# Patient Record
Sex: Female | Born: 1950
Health system: Southern US, Community
[De-identification: ages and names within clinical notes are randomized; demographics above are authoritative.]

## PROBLEM LIST (undated history)

## (undated) DIAGNOSIS — C801 Malignant (primary) neoplasm, unspecified: Secondary | ICD-10-CM

## (undated) DIAGNOSIS — F0390 Unspecified dementia without behavioral disturbance: Secondary | ICD-10-CM

## (undated) DIAGNOSIS — J45909 Unspecified asthma, uncomplicated: Secondary | ICD-10-CM

## (undated) DIAGNOSIS — I10 Essential (primary) hypertension: Secondary | ICD-10-CM

## (undated) HISTORY — DX: Essential (primary) hypertension: I10

## (undated) HISTORY — DX: Unspecified asthma, uncomplicated: J45.909

## (undated) HISTORY — PX: ABDOMINAL HYSTERECTOMY: SHX81

---

## 2004-10-12 ENCOUNTER — Ambulatory Visit (HOSPITAL_COMMUNITY): Admission: RE | Admit: 2004-10-12 | Discharge: 2004-10-12 | Payer: Self-pay | Admitting: Family Medicine

## 2007-11-03 ENCOUNTER — Inpatient Hospital Stay (HOSPITAL_COMMUNITY): Admission: EM | Admit: 2007-11-03 | Discharge: 2007-11-05 | Payer: Self-pay | Admitting: Emergency Medicine

## 2011-05-15 NOTE — H&P (Signed)
NAME:  Whitney Velez, Whitney Velez               ACCOUNT NO.:  [id number]   MEDICAL RECORD NO.:  [id number]          PATIENT TYPE:  OBV   LOCATION:  A226                          FACILITY:  [facility]   PHYSICIAN:  Osvaldo Shipper, MD     DATE OF BIRTH:  1950/12/30   DATE OF ADMISSION:  11/03/2007  DATE OF DISCHARGE:  LH                              HISTORY & PHYSICAL   PRIMARY CARE PHYSICIAN:  Donna Bernard, M.D.   ADMISSION DIAGNOSES:  1. Angioedema, likely secondary to ace inhibitor.  2. History of diabetes.  3. History of hypertension.  4. History of asthma.   CHIEF COMPLAINT:  Swollen tongue.   HISTORY OF PRESENT ILLNESS:  The patient is a 61 year old African  American female who has a past medical history of diabetes,  hypertension, asthma, who was in her usual state of health about four  weeks ago when she had some tongue swelling which resolved on its own,  and about two weeks ago, she again developed tongue swelling after she  had some barbecue sauce which also resolved on its own, and then last  night, she again had a barbecue chicken, and the tongue swelling which  got progressively worse causing her difficulty swallowing, and she  presented to the ED subsequently.  She denies any shortness of breath.  Denies any pain.  Denies any trauma to the tongue.  Denies any seizure  activity.  She says she has been on the Enalapril for about 7-8 months  now.  Prior to four weeks, she has never had these kind of complaints  before.   MEDICATIONS AT HOME:  1. Metformin 500 mg b.i.d.  2. Aspirin 81 mg once daily.  3. Albuterol inhaler as needed.  4. Enalapril 10 mg once daily.  5. Tussionex over-the-counter as needed.  6. HCTZ 25 mg once daily.   ALLERGIES:  PENICILLIN AND NOW POTENTIALLY ACE INHIBITORS.   PAST MEDICAL HISTORY:  Positive for asthma, diabetes and hypertension.   PAST SURGICAL HISTORY:  Hysterectomy.   SOCIAL HISTORY:  Lives in [place], works as a Lawyer.  No alcohol  use,  illicit drug use or smoking.   FAMILY HISTORY:  Positive for diabetes, hypertension.   REVIEW OF SYSTEMS:  GENERAL:  Positive for weakness.  HEENT:  As in HPI.  CARDIOVASCULAR:  Unremarkable.  RESPIRATORY:  Unremarkable.  GI:  Unremarkable.  GU:  Unremarkable.   MUSCULOSKELETAL:  Unremarkable.  ENDOCRINE:  Unremarkable.  NEUROLOGICAL:  Unremarkable.  PSYCHIATRIC:  Unremarkable.  DERMATOLOGICAL:  Unremarkable.   PHYSICAL EXAMINATION:  VITAL SIGNS:  Temperature 97.7, blood pressure  150/94 rising to 183/82, heart rate 115, respiratory rate 18, pulse  oximetry 96% on room air.  GENERAL:  This is an obese African American female in no distress.  HEENT:  Head and eyes are all within normal limits.  She has a swollen  tongue that is clearly obvious.  She has some edema in her neck as well.  No thyromegaly is appreciated.  LUNGS:  Clear to auscultation.  No wheezing is appreciated.  CARDIOVASCULAR:  S1, S2 is slightly tachycardic, irregular,  no murmurs  appreciated.  ABDOMEN:  Soft, nontender, nondistended.  Bowel sounds are present.  No  mass or organomegaly is appreciated.  LOWER EXTREMITIES:  Without edema.  Peripheral pulses are palpable.   LABORATORY DATA:  No labs have been obtained.  No imaging studies have  been done.   ASSESSMENT:  This is a 61 year old African American female who has been  confirmed, as discussed earlier, presents with tongue swelling.  She has  evidence for angioedema.  She is on an ACE inhibitor.  She has been on  it for about 7-8 months now, and still, I think this is the likely  reason for angioedema.   PLAN:  Angioedema.  Will observe the patient in the hospital as she is  at risk for respiratory failure.  She has been given Pepcid and Solu-  Medrol which we will continue.  We will also give her some nebulizer  treatments.  We will check a serum C4 level, check her CBC, C-met and  PT/PTT.  Epinephrine could not be given because of significant   hypertension.   I will hold off on any oral medication for now until her swelling  improves and she is able to take orally comfortably.  In the meantime,  we will check her CBGs q.a.c. and q.h.s. and cover with sliding scale  insulin.   Dr. Gerda Diss will resume care at about 7-8 o'clock this morning.  Until  then, we will provide coverage.      Osvaldo Shipper, MD  Electronically Signed     GK/MEDQ  D:  11/03/2007  T:  11/03/2007  Job:  [id number]   cc:   Donna Bernard, M.D.  Fax: [phone]

## 2011-05-18 NOTE — Discharge Summary (Signed)
NAMECRISTA, NUON               ACCOUNT NO.:  [id number]   MEDICAL RECORD NO.:  [id number]          PATIENT TYPE:  INP   LOCATION:  A226                          FACILITY:  [facility]   PHYSICIAN:  Donna Bernard, M.D.DATE OF BIRTH:  11/04/1950   DATE OF ADMISSION:  11/03/2007  DATE OF DISCHARGE:  11/05/2008LH                               DISCHARGE SUMMARY   FINAL DIAGNOSES:  1. Angioedema.  2. Exacerbation of asthma.  3. Hypertension.  4. Type 2 diabetes.   FINAL DISPOSITION:  Patient discharged to home.   FOLLOW UP:  1. Patient followup in our office in week.  2. Patient to follow up with an asthma and allergy specialists within      a week.   DISCHARGE MEDICATIONS:  1. Prednisone taper as directed.  2. Verapamil 180 mg daily added to chronic medicine.  3. Stop Enalapril.  4. Tussionex 1 teaspoon q.h.s. p.r.n. for cough.   INITIAL HISTORY AND PHYSICAL:  Please see H&P as dictated by Dr.  Rito Ehrlich.   HOSPITAL COURSE:  This patient is a 61 year old black female who has a  history of diabetes, hypertension, and asthma who presented multiple  times to the ER with intermittent swelling of face, tongue, and throat.  On the day of admission, it hit her pretty severely.  She had gone been  on enalapril for the past 8 months.  She never had any bouts like this  prior. The patient came in with air hunger.  She was felt to be short of  breath.  On exam, she had no true stridor.  She had an impressively  swollen tongue.  There was also edema in her neck.  O2 saturations were  good at 96% on room air.  With all her symptomatology, it was felt that  she needed to be in the hospital.  We admitted her.  She was placed on  IV Solu-Medrol.  Her ACE inhibitor was stopped.  We initiated calcium  channel blocker.  Over  the next 48 hours, the patient gradually improved.  She was also given  Xopenex breathing treatments every 6 hours, with this significant  improvement in her  symptomatology.  On the day of discharge, she was  felt to be stable and sent home with the diagnoses and disposition as  noted above.      Donna Bernard, M.D.  Electronically Signed     WSL/MEDQ  D:  12/15/2007  T:  12/15/2007  Job:  [id number]

## 2011-10-09 LAB — DIFFERENTIAL
Basophils Absolute: 0
Basophils Relative: 0
Eosinophils Absolute: 0.5
Eosinophils Relative: 4
Lymphocytes Relative: 12
Lymphs Abs: 1.6
Monocytes Absolute: 0.7
Monocytes Relative: 5
Neutro Abs: 10.9 — ABNORMAL HIGH
Neutrophils Relative %: 80 — ABNORMAL HIGH

## 2011-10-09 LAB — COMPREHENSIVE METABOLIC PANEL
ALT: 10
AST: 14
Albumin: 3.3 — ABNORMAL LOW
Alkaline Phosphatase: 78
BUN: 18
CO2: 31
Calcium: 9.3
Chloride: 103
Creatinine, Ser: 1.05
GFR calc Af Amer: >60
GFR calc non Af Amer: 54 — ABNORMAL LOW
Glucose, Bld: 125 — ABNORMAL HIGH
Potassium: 3.2 — ABNORMAL LOW
Sodium: 140
Total Bilirubin: 0.6
Total Protein: 8.5 — ABNORMAL HIGH

## 2011-10-09 LAB — CBC
HCT: 30.3 — ABNORMAL LOW
Hemoglobin: 10.2 — ABNORMAL LOW
MCHC: 33.5
MCV: 80.2
Platelets: 386
RBC: 3.78 — ABNORMAL LOW
RDW: 15 — ABNORMAL HIGH
WBC: 13.8 — ABNORMAL HIGH

## 2011-10-09 LAB — PROTIME-INR
INR: 1.1
Prothrombin Time: 14.2

## 2011-10-09 LAB — C4 COMPLEMENT: Complement C4, Body Fluid: 22

## 2011-10-09 LAB — C1 ESTERASE INHIBITOR: C1INH SerPl-mCnc: 18 mg/dL (ref 11–26)

## 2011-10-09 LAB — APTT: aPTT: 29

## 2013-02-04 DIAGNOSIS — E785 Hyperlipidemia, unspecified: Secondary | ICD-10-CM | POA: Diagnosis not present

## 2013-02-04 DIAGNOSIS — R5383 Other fatigue: Secondary | ICD-10-CM | POA: Diagnosis not present

## 2013-02-04 DIAGNOSIS — J45909 Unspecified asthma, uncomplicated: Secondary | ICD-10-CM | POA: Diagnosis not present

## 2013-02-04 DIAGNOSIS — Z79899 Other long term (current) drug therapy: Secondary | ICD-10-CM | POA: Diagnosis not present

## 2013-02-04 DIAGNOSIS — I10 Essential (primary) hypertension: Secondary | ICD-10-CM | POA: Diagnosis not present

## 2013-02-04 DIAGNOSIS — R5381 Other malaise: Secondary | ICD-10-CM | POA: Diagnosis not present

## 2013-02-04 DIAGNOSIS — E119 Type 2 diabetes mellitus without complications: Secondary | ICD-10-CM | POA: Diagnosis not present

## 2013-10-26 ENCOUNTER — Other Ambulatory Visit: Payer: Self-pay | Admitting: Family Medicine

## 2013-12-16 ENCOUNTER — Ambulatory Visit (INDEPENDENT_AMBULATORY_CARE_PROVIDER_SITE_OTHER): Payer: Medicare Other | Admitting: Nurse Practitioner

## 2013-12-16 ENCOUNTER — Encounter: Payer: Self-pay | Admitting: Nurse Practitioner

## 2013-12-16 VITALS — BP 142/90 | Ht 60.0 in | Wt 197.0 lb

## 2013-12-16 DIAGNOSIS — E119 Type 2 diabetes mellitus without complications: Secondary | ICD-10-CM | POA: Insufficient documentation

## 2013-12-16 DIAGNOSIS — I10 Essential (primary) hypertension: Secondary | ICD-10-CM | POA: Insufficient documentation

## 2013-12-16 LAB — POCT GLYCOSYLATED HEMOGLOBIN (HGB A1C): Hemoglobin A1C: 6.3

## 2013-12-16 MED ORDER — VERAPAMIL HCL ER 240 MG PO TBCR: EXTENDED_RELEASE_TABLET | ORAL | Status: DC

## 2013-12-16 MED ORDER — HYDROCODONE-ACETAMINOPHEN 5-325 MG PO TABS: 1.0000 | ORAL_TABLET | ORAL | Status: DC | PRN

## 2013-12-16 MED ORDER — HYDROCHLOROTHIAZIDE 25 MG PO TABS: 25.0000 mg | ORAL_TABLET | Freq: Every day | ORAL | Status: DC

## 2013-12-16 MED ORDER — METFORMIN HCL 500 MG PO TABS: 500.0000 mg | ORAL_TABLET | Freq: Two times a day (BID) | ORAL | Status: DC

## 2013-12-19 ENCOUNTER — Encounter: Payer: Self-pay | Admitting: Nurse Practitioner

## 2013-12-19 NOTE — Progress Notes (Signed)
Subjective:  Presents for routine followup. Has an active job. Did not take her BP pills last night, has run out. Compliant up until been. Defers flu and pneumonia vaccines. No chest pain shortness of breath or edema. Doing fairly well with her diet. Takes a rare hydrocodone for orthopedic pain.  Objective:   BP 142/90  Ht 5' (1.524 m)  Wt 197 lb (89.359 kg)  BMI 38.47 kg/m2 NAD. Alert, oriented. Lungs clear. Heart regular rate rhythm. Lower extremities no edema. Hemoglobin A1c 6.3.  Assessment:Essential hypertension, benign  Type 2 diabetes mellitus - Plan: POCT glycosylated hemoglobin (Hb A1C)  Plan: Meds ordered this encounter  Medications  . aspirin 81 MG tablet    Sig: Take 81 mg by mouth daily.  Marland Kitchen DISCONTD: HYDROcodone-acetaminophen (NORCO/VICODIN) 5-325 MG per tablet    Sig: Take 1 tablet by mouth every 4 (four) hours as needed for moderate pain.  Marland Kitchen DISCONTD: hydrochlorothiazide (HYDRODIURIL) 25 MG tablet    Sig: Take 25 mg by mouth daily.  Marland Kitchen DISCONTD: metFORMIN (GLUCOPHAGE) 500 MG tablet    Sig: Take 500 mg by mouth 2 (two) times daily with a meal.  . vitamin C (ASCORBIC ACID) 500 MG tablet    Sig: Take 500 mg by mouth daily.  . Flaxseed, Linseed, (FLAXSEED OIL) 1000 MG CAPS    Sig: Take by mouth daily.  . Cholecalciferol (VITAMIN D-3) 1000 UNITS CAPS    Sig: Take by mouth daily.  Marland Kitchen HYDROcodone-acetaminophen (NORCO/VICODIN) 5-325 MG per tablet    Sig: Take 1 tablet by mouth every 4 (four) hours as needed for moderate pain.    Dispense:  30 tablet    Refill:  0    Order Specific Question:  Supervising Provider    Answer:  Merlyn Albert [[id number]]  . hydrochlorothiazide (HYDRODIURIL) 25 MG tablet    Sig: Take 1 tablet (25 mg total) by mouth daily.    Dispense:  30 tablet    Refill:  5    Order Specific Question:  Supervising Provider    Answer:  Merlyn Albert [[id number]]  . metFORMIN (GLUCOPHAGE) 500 MG tablet    Sig: Take 1 tablet (500 mg total) by mouth 2 (two) times  daily with a meal.    Dispense:  60 tablet    Refill:  5    Order Specific Question:  Supervising Provider    Answer:  Merlyn Albert [[id number]]  . verapamil (CALAN-SR) 240 MG CR tablet    Sig: TAKE ONE (1) TABLET BY MOUTH EVERY DAY    Dispense:  30 tablet    Refill:  5    Order Specific Question:  Supervising Provider    Answer:  Merlyn Albert [[id number]]   recheck in 6 months with appropriate lab work. Reminded patient about preventive health physical. Continue weight loss efforts. Recommend restarting her walking program.

## 2013-12-19 NOTE — Assessment & Plan Note (Signed)
.   metFORMIN (GLUCOPHAGE) 500 MG tablet    Sig: Take 1 tablet (500 mg total) by mouth 2 (two) times daily with a meal.    Dispense:  60 tablet    Refill:  5    Order Specific Question:  Supervising Provider    Answer:  LUKING, WILLIAM S [[id number]]  . verapamil (CALAN-SR) 240 MG CR tablet    Sig: TAKE ONE (1) TABLET BY MOUTH EVERY DAY    Dispense:  30 tablet    Refill:  5    Order Specific Question:  Supervising Provider    Answer:  LUKING, WILLIAM S [[id number]]   recheck in 6 months with appropriate lab work. Reminded patient about preventive health physical. Continue weight loss efforts. Recommend restarting her walking program. 

## 2013-12-19 NOTE — Assessment & Plan Note (Signed)
.   metFORMIN (GLUCOPHAGE) 500 MG tablet    Sig: Take 1 tablet (500 mg total) by mouth 2 (two) times daily with a meal.    Dispense:  60 tablet    Refill:  5    Order Specific Question:  Supervising Provider    Answer:  Merlyn Albert [[id number]]  . verapamil (CALAN-SR) 240 MG CR tablet    Sig: TAKE ONE (1) TABLET BY MOUTH EVERY DAY    Dispense:  30 tablet    Refill:  5    Order Specific Question:  Supervising Provider    Answer:  Merlyn Albert [[id number]]   recheck in 6 months with appropriate lab work. Reminded patient about preventive health physical. Continue weight loss efforts. Recommend restarting her walking program.

## 2013-12-30 ENCOUNTER — Other Ambulatory Visit: Payer: Self-pay | Admitting: Nurse Practitioner

## 2013-12-30 ENCOUNTER — Telehealth: Payer: Self-pay | Admitting: Family Medicine

## 2013-12-30 MED ORDER — AZITHROMYCIN 250 MG PO TABS: ORAL_TABLET | ORAL | Status: DC

## 2013-12-30 NOTE — Progress Notes (Signed)
Pt notified med sent to pharm and ov if no better.

## 2013-12-30 NOTE — Telephone Encounter (Signed)
Patient is having a sore throat and Whitney Velez told her last week if she needed anything we would call her something in for this.   [name]

## 2014-07-01 ENCOUNTER — Telehealth: Payer: Self-pay | Admitting: Family Medicine

## 2014-07-01 NOTE — Telephone Encounter (Signed)
Pt needs a referral to [facility] For her diabetic visit with them for her nails to be clipped so  Her insurance will pay for it.      [phone]

## 2014-07-01 NOTE — Telephone Encounter (Signed)
Pt late on 6 mo ck up with [place] for diabetes plz sched, then we can wrjk on referral

## 2014-07-01 NOTE — Telephone Encounter (Signed)
Left message on voicemail to return call.

## 2014-07-05 NOTE — Telephone Encounter (Signed)
Patient transferred to front desk to schedule appointment.  

## 2014-07-15 ENCOUNTER — Ambulatory Visit (INDEPENDENT_AMBULATORY_CARE_PROVIDER_SITE_OTHER): Payer: Medicare Other | Admitting: Nurse Practitioner

## 2014-07-15 ENCOUNTER — Telehealth: Payer: Self-pay | Admitting: Nurse Practitioner

## 2014-07-15 ENCOUNTER — Encounter: Payer: Self-pay | Admitting: Nurse Practitioner

## 2014-07-15 VITALS — BP 138/88 | Ht 60.0 in | Wt 188.4 lb

## 2014-07-15 DIAGNOSIS — I10 Essential (primary) hypertension: Secondary | ICD-10-CM | POA: Diagnosis not present

## 2014-07-15 DIAGNOSIS — Q846 Other congenital malformations of nails: Secondary | ICD-10-CM

## 2014-07-15 DIAGNOSIS — E119 Type 2 diabetes mellitus without complications: Secondary | ICD-10-CM | POA: Diagnosis not present

## 2014-07-15 DIAGNOSIS — Z79899 Other long term (current) drug therapy: Secondary | ICD-10-CM | POA: Diagnosis not present

## 2014-07-15 MED ORDER — VERAPAMIL HCL ER 240 MG PO TBCR: EXTENDED_RELEASE_TABLET | ORAL | Status: DC

## 2014-07-15 MED ORDER — HYDROCODONE-ACETAMINOPHEN 5-325 MG PO TABS: 1.0000 | ORAL_TABLET | ORAL | Status: DC | PRN

## 2014-07-15 NOTE — Telephone Encounter (Signed)
Pt just left an was told by Hoyle Sauer when in here for her appt that we  Would be refilling her   HYDROcodone-acetaminophen (NORCO/VICODIN) 5-325 MG per tablet verapamil (CALAN-SR) 240 MG CR tablet  Can we send in the verapamil and call pt when the Hydrocodone is ready

## 2014-07-15 NOTE — Progress Notes (Signed)
Subjective:  Presents for recheck. Regular walking program. Has cut back on servings. Rare vicodin only for severe pain. No CP/ischemic type pain or SOB with activity. Has a long curved toenail causing discomfort. Needs referral to podiatry. Needs eye exam. No numbness or pain in feet.   Objective:   BP 138/88  Ht 5' (1.524 m)  Wt 188 lb 6 oz (85.446 kg)  BMI 36.79 kg/m2 NAD. Alert, oriented. Lungs clear. Heart RRR. Lower extremities no edema. See foot exam.   Assessment:  Problem List Items Addressed This Visit     Cardiovascular and Mediastinum   Essential hypertension, benign - Primary (Chronic)   Relevant Medications      verapamil (CALAN-SR) CR tablet   Other Relevant Orders      Lipid panel     Endocrine   Type 2 diabetes mellitus (Chronic)   Relevant Orders      Microalbumin, urine      Hemoglobin A1c    Other Visit Diagnoses   Encounter for long-term (current) use of other medications        Relevant Orders       Hepatic function panel       Basic metabolic panel    Abnormality of shape of nail            Plan: Meds ordered this encounter  Medications  . verapamil (CALAN-SR) 240 MG CR tablet    Sig: TAKE ONE (1) TABLET BY MOUTH EVERY DAY    Dispense:  30 tablet    Refill:  5    Order Specific Question:  Supervising Provider    Answer:  Mikey Kirschner [[id number]]  . HYDROcodone-acetaminophen (NORCO/VICODIN) 5-325 MG per tablet    Sig: Take 1 tablet by mouth every 4 (four) hours as needed for moderate pain.    Dispense:  30 tablet    Refill:  0    Order Specific Question:  Supervising Provider    Answer:  Maggie Font   Strongly recommend preventive health physical. Recommend eye exam.  Return in about 6 months (around 01/15/2015).

## 2014-07-17 ENCOUNTER — Encounter: Payer: Self-pay | Admitting: Nurse Practitioner

## 2014-07-20 ENCOUNTER — Telehealth: Payer: Self-pay | Admitting: Nurse Practitioner

## 2014-07-20 DIAGNOSIS — Z79899 Other long term (current) drug therapy: Secondary | ICD-10-CM | POA: Diagnosis not present

## 2014-07-20 DIAGNOSIS — E119 Type 2 diabetes mellitus without complications: Secondary | ICD-10-CM | POA: Diagnosis not present

## 2014-07-20 DIAGNOSIS — I10 Essential (primary) hypertension: Secondary | ICD-10-CM | POA: Diagnosis not present

## 2014-07-20 LAB — BASIC METABOLIC PANEL
BUN: 9 mg/dL (ref 6–23)
CO2: 28 mEq/L (ref 19–32)
Calcium: 9.6 mg/dL (ref 8.4–10.5)
Chloride: 99 mEq/L (ref 96–112)
Creat: 0.93 mg/dL (ref 0.50–1.10)
Glucose, Bld: 99 mg/dL (ref 70–99)
Potassium: 4.2 mEq/L (ref 3.5–5.3)
Sodium: 139 mEq/L (ref 135–145)

## 2014-07-20 LAB — HEPATIC FUNCTION PANEL
ALT: 11 U/L (ref 0–35)
AST: 17 U/L (ref 0–37)
Albumin: 4 g/dL (ref 3.5–5.2)
Alkaline Phosphatase: 100 U/L (ref 39–117)
Bilirubin, Direct: 0.1 mg/dL (ref 0.0–0.3)
Indirect Bilirubin: 0.4 mg/dL (ref 0.2–1.2)
Total Bilirubin: 0.5 mg/dL (ref 0.2–1.2)
Total Protein: 7.6 g/dL (ref 6.0–8.3)

## 2014-07-20 LAB — LIPID PANEL
Cholesterol: 180 mg/dL (ref 0–200)
HDL: 52 mg/dL (ref >39)
LDL Cholesterol: 97 mg/dL (ref 0–99)
Total CHOL/HDL Ratio: 3.5 Ratio
Triglycerides: 154 mg/dL — ABNORMAL HIGH (ref <150)
VLDL: 31 mg/dL (ref 0–40)

## 2014-07-20 LAB — HEMOGLOBIN A1C
Hgb A1c MFr Bld: 5.9 % — ABNORMAL HIGH (ref <5.7)
Mean Plasma Glucose: 123 mg/dL — ABNORMAL HIGH (ref <117)

## 2014-07-20 NOTE — Telephone Encounter (Signed)
Patient calling to check on referral to foot doctor. I currently don't see referral in the system.

## 2014-07-21 ENCOUNTER — Encounter: Payer: Self-pay | Admitting: Family Medicine

## 2014-07-21 LAB — MICROALBUMIN, URINE: Microalb, Ur: 2.3 mg/dL — ABNORMAL HIGH (ref 0.00–1.89)

## 2014-07-21 NOTE — Telephone Encounter (Signed)
Referral done, [facility] to notify pt & mailed letter

## 2014-07-22 ENCOUNTER — Encounter: Payer: Self-pay | Admitting: Nurse Practitioner

## 2014-08-17 DIAGNOSIS — E1149 Type 2 diabetes mellitus with other diabetic neurological complication: Secondary | ICD-10-CM | POA: Diagnosis not present

## 2014-08-17 DIAGNOSIS — M79609 Pain in unspecified limb: Secondary | ICD-10-CM | POA: Diagnosis not present

## 2014-08-17 DIAGNOSIS — L609 Nail disorder, unspecified: Secondary | ICD-10-CM | POA: Diagnosis not present

## 2014-10-12 ENCOUNTER — Encounter: Payer: Self-pay | Admitting: Family Medicine

## 2014-10-12 ENCOUNTER — Ambulatory Visit (INDEPENDENT_AMBULATORY_CARE_PROVIDER_SITE_OTHER): Payer: Medicare Other | Admitting: Family Medicine

## 2014-10-12 VITALS — BP 130/78 | Temp 99.2°F | Ht 60.0 in | Wt 190.2 lb

## 2014-10-12 DIAGNOSIS — J329 Chronic sinusitis, unspecified: Secondary | ICD-10-CM | POA: Diagnosis not present

## 2014-10-12 MED ORDER — AZITHROMYCIN 250 MG PO TABS: ORAL_TABLET | ORAL | Status: DC

## 2014-10-12 NOTE — Progress Notes (Signed)
   Subjective:    Patient ID: Whitney Velez, female    DOB: 1950/10/25, 64 y.o.   MRN: [id number]  Sinusitis This is a new problem. The current episode started in the past 7 days. Associated symptoms include congestion and coughing. (Wheezing)   Headache and cough and sore throat  Took claritin did not help  Diminished energy  Settling into the chest too  No high fevers at home  No headache or muscle aches     Review of Systems  HENT: Positive for congestion.   Respiratory: Positive for cough.        Objective:   Physical Exam  Alert moderate malaise. Frontal congestion. Frontal tenderness lungs were tenderness. Pharynx normal neck supple. Lungs no wheezes currently heart regular in rhythm      Assessment & Plan:  Impression rhinosinusitis and reactive airways plan antibiotics prescribed. Symptomatic care discussed. Ventolin when necessary. Warning signs discussed. WSL

## 2014-10-15 ENCOUNTER — Telehealth: Payer: Self-pay | Admitting: Family Medicine

## 2014-10-15 MED ORDER — ALBUTEROL SULFATE HFA 108 (90 BASE) MCG/ACT IN AERS: 2.0000 | INHALATION_SPRAY | Freq: Four times a day (QID) | RESPIRATORY_TRACT | Status: DC | PRN

## 2014-10-15 NOTE — Telephone Encounter (Signed)
Patient needs Rx for Proventil inhaler to [facility].

## 2014-10-15 NOTE — Telephone Encounter (Signed)
Ok plus 5 ref 

## 2014-10-15 NOTE — Telephone Encounter (Signed)
Medication sent, patient notified. 

## 2014-11-15 DIAGNOSIS — M79609 Pain in unspecified limb: Secondary | ICD-10-CM | POA: Diagnosis not present

## 2014-11-15 DIAGNOSIS — E1140 Type 2 diabetes mellitus with diabetic neuropathy, unspecified: Secondary | ICD-10-CM | POA: Diagnosis not present

## 2014-11-15 DIAGNOSIS — L609 Nail disorder, unspecified: Secondary | ICD-10-CM | POA: Diagnosis not present

## 2015-01-18 ENCOUNTER — Ambulatory Visit: Payer: Medicare Other | Admitting: Nurse Practitioner

## 2015-01-19 ENCOUNTER — Other Ambulatory Visit: Payer: Self-pay | Admitting: Nurse Practitioner

## 2015-02-16 ENCOUNTER — Encounter: Payer: Self-pay | Admitting: Family Medicine

## 2015-02-16 ENCOUNTER — Ambulatory Visit (INDEPENDENT_AMBULATORY_CARE_PROVIDER_SITE_OTHER): Payer: Medicare Other | Admitting: Family Medicine

## 2015-02-16 VITALS — BP 122/88 | Ht 60.0 in | Wt 192.2 lb

## 2015-02-16 DIAGNOSIS — L609 Nail disorder, unspecified: Secondary | ICD-10-CM | POA: Diagnosis not present

## 2015-02-16 DIAGNOSIS — E1129 Type 2 diabetes mellitus with other diabetic kidney complication: Secondary | ICD-10-CM

## 2015-02-16 DIAGNOSIS — Z23 Encounter for immunization: Secondary | ICD-10-CM

## 2015-02-16 DIAGNOSIS — I10 Essential (primary) hypertension: Secondary | ICD-10-CM

## 2015-02-16 DIAGNOSIS — J301 Allergic rhinitis due to pollen: Secondary | ICD-10-CM

## 2015-02-16 DIAGNOSIS — E119 Type 2 diabetes mellitus without complications: Secondary | ICD-10-CM | POA: Diagnosis not present

## 2015-02-16 DIAGNOSIS — E1342 Other specified diabetes mellitus with diabetic polyneuropathy: Secondary | ICD-10-CM | POA: Diagnosis not present

## 2015-02-16 DIAGNOSIS — R809 Proteinuria, unspecified: Secondary | ICD-10-CM | POA: Diagnosis not present

## 2015-02-16 LAB — POCT GLYCOSYLATED HEMOGLOBIN (HGB A1C): Hemoglobin A1C: 5.4

## 2015-02-16 MED ORDER — VERAPAMIL HCL ER 240 MG PO TBCR: EXTENDED_RELEASE_TABLET | ORAL | Status: DC

## 2015-02-16 MED ORDER — METFORMIN HCL 500 MG PO TABS: ORAL_TABLET | ORAL | Status: DC

## 2015-02-16 MED ORDER — HYDROCHLOROTHIAZIDE 25 MG PO TABS: ORAL_TABLET | ORAL | Status: DC

## 2015-02-16 MED ORDER — HYDROCODONE-ACETAMINOPHEN 5-325 MG PO TABS: 1.0000 | ORAL_TABLET | ORAL | Status: DC | PRN

## 2015-02-16 MED ORDER — MONTELUKAST SODIUM 10 MG PO TABS: 10.0000 mg | ORAL_TABLET | Freq: Every day | ORAL | Status: DC

## 2015-02-16 NOTE — Progress Notes (Signed)
   Subjective:    Patient ID: Whitney Velez, female    DOB: 03-26-1950, 65 y.o.   MRN: [id number]  Hypertension This is a chronic problem. The current episode started more than 1 year ago. The problem has been gradually improving since onset. The problem is controlled. There are no associated agents to hypertension. There are no known risk factors for coronary artery disease. Treatments tried: Verapamil. The current treatment provides significant improvement. There are no compliance problems.    Patient wants to discuss decreasing her HCTZ to every other day because it is causing her to urinate a lot.    Patient states that se wants to discuss starting back on Singulair for her allergies. States it definitely helps her. No significant side effects. Results for orders placed or performed in visit on 07/15/14  Lipid panel  Result Value Ref Range   Cholesterol 180 0 - 200 mg/dL   Triglycerides 154 (H) <150 mg/dL   HDL 52 >39 mg/dL   Total CHOL/HDL Ratio 3.5 Ratio   VLDL 31 0 - 40 mg/dL   LDL Cholesterol 97 0 - 99 mg/dL  Hepatic function panel  Result Value Ref Range   Total Bilirubin 0.5 0.2 - 1.2 mg/dL   Bilirubin, Direct 0.1 0.0 - 0.3 mg/dL   Indirect Bilirubin 0.4 0.2 - 1.2 mg/dL   Alkaline Phosphatase 100 39 - 117 U/L   AST 17 0 - 37 U/L   ALT 11 0 - 35 U/L   Total Protein 7.6 6.0 - 8.3 g/dL   Albumin 4.0 3.5 - 5.2 g/dL  Basic metabolic panel  Result Value Ref Range   Sodium 139 135 - 145 mEq/L   Potassium 4.2 3.5 - 5.3 mEq/L   Chloride 99 96 - 112 mEq/L   CO2 28 19 - 32 mEq/L   Glucose, Bld 99 70 - 99 mg/dL   BUN 9 6 - 23 mg/dL   Creat 0.93 0.50 - 1.10 mg/dL   Calcium 9.6 8.4 - 10.5 mg/dL  Microalbumin, urine  Result Value Ref Range   Microalb, Ur 2.30 (H) 0.00 - 1.89 mg/dL  Hemoglobin A1c  Result Value Ref Range   Hgb A1c MFr Bld 5.9 (H) <5.7 %   Mean Plasma Glucose 123 (H) <117 mg/dL   Most morning sugars in the past in the upper 90s. Known history of type 2  diabetes. Watching her diet. Check sugars frequently. Exercising some.     Review of Systems No headache no chest pain no back pain abdominal pain no change in bowel habits no blood in stool ROS otherwise negative    Objective:   Physical Exam Blood pressure good on repeat Alert no acute distress. HEENT normal. Lungs clear. Heart regular in rhythm. Ankles without edema.      Assessment & Plan:  Impression 1 hypertension good control discussed #2 type 2 diabetes good control also discussed #3 allergic rhinitis discussed

## 2015-02-20 DIAGNOSIS — R809 Proteinuria, unspecified: Secondary | ICD-10-CM

## 2015-02-20 DIAGNOSIS — E1129 Type 2 diabetes mellitus with other diabetic kidney complication: Secondary | ICD-10-CM | POA: Insufficient documentation

## 2015-02-20 DIAGNOSIS — J309 Allergic rhinitis, unspecified: Secondary | ICD-10-CM | POA: Insufficient documentation

## 2015-02-25 ENCOUNTER — Telehealth: Payer: Self-pay | Admitting: Family Medicine

## 2015-02-25 NOTE — Telephone Encounter (Signed)
Went over supportive measures for sore throat. Told her it sounds viral according to Dr. Nicki Reaper. Go to ER or urgent care over the weekend if not better. Pt verbalized understanding.

## 2015-02-25 NOTE — Telephone Encounter (Signed)
[facility]   Symptoms, throat burning(feels like its on fire)  stopped up, came up yesterday Took her prednisone but not helping  Seen on 2/17 but for a Phy not to address any sinus issues

## 2015-03-02 ENCOUNTER — Encounter: Payer: Self-pay | Admitting: Family Medicine

## 2015-03-02 ENCOUNTER — Ambulatory Visit (INDEPENDENT_AMBULATORY_CARE_PROVIDER_SITE_OTHER): Payer: Medicare Other | Admitting: Family Medicine

## 2015-03-02 VITALS — BP 102/70 | Temp 98.8°F | Ht 60.0 in | Wt 190.4 lb

## 2015-03-02 DIAGNOSIS — J208 Acute bronchitis due to other specified organisms: Secondary | ICD-10-CM | POA: Diagnosis not present

## 2015-03-02 DIAGNOSIS — R062 Wheezing: Secondary | ICD-10-CM

## 2015-03-02 DIAGNOSIS — J329 Chronic sinusitis, unspecified: Secondary | ICD-10-CM

## 2015-03-02 MED ORDER — AZITHROMYCIN 250 MG PO TABS: ORAL_TABLET | ORAL | Status: DC

## 2015-03-02 MED ORDER — PREDNISONE 20 MG PO TABS: ORAL_TABLET | ORAL | Status: DC

## 2015-03-02 NOTE — Progress Notes (Signed)
   Subjective:    Patient ID: Whitney Velez, female    DOB: 01-30-1950, 65 y.o.   MRN: [id number]  URI  This is a new problem. The current episode started in the past 7 days. The problem has been gradually worsening. Associated symptoms include abdominal pain, congestion, coughing, rhinorrhea, a sore throat and wheezing. Pertinent negatives include no chest pain or ear pain. Associated symptoms comments: Shortness of breath. She has tried decongestant and inhaler use for the symptoms. The treatment provided no relief.   Patient states that she has no other concerns at this time.  Patient relates that the wheezing was worse over the past couple days had to proper self up to breathe at night. She states she is able to walk around but she does get a little short of breath with activity.  Review of Systems  Constitutional: Negative for fever and activity change.  HENT: Positive for congestion, rhinorrhea and sore throat. Negative for ear pain.   Eyes: Negative for discharge.  Respiratory: Positive for cough and wheezing. Negative for shortness of breath.   Cardiovascular: Negative for chest pain.  Gastrointestinal: Positive for abdominal pain.       Objective:   Physical Exam  Constitutional: She appears well-developed.  HENT:  Head: Normocephalic.  Nose: Nose normal.  Mouth/Throat: Oropharynx is clear and moist. No oropharyngeal exudate.  Neck: Neck supple.  Cardiovascular: Normal rate and normal heart sounds.   No murmur heard. Pulmonary/Chest: Effort normal. She has wheezes.  Lymphadenopathy:    She has no cervical adenopathy.  Skin: Skin is warm and dry.  Nursing note and vitals reviewed.   Not respiratory distress      Assessment & Plan:  1. Rhinosinusitis Viral syndrome with secondary sinusitis antibiotics prescribed warning signs discuss  2. Wheeze Reactive airway related to this upper respiratory illness prednisone taper over the next 9 days watch starches and diet  closely  3. Acute bronchitis due to other specified organisms Bronchitis probably due to a viral illness is causing the reactive airway should gradually get better with the above treatment albuterol when necessary warning signs discuss

## 2015-04-18 ENCOUNTER — Encounter: Payer: Medicare Other | Admitting: Nurse Practitioner

## 2015-04-18 DIAGNOSIS — Z029 Encounter for administrative examinations, unspecified: Secondary | ICD-10-CM

## 2015-04-20 LAB — HM DIABETES EYE EXAM

## 2015-05-25 DIAGNOSIS — L609 Nail disorder, unspecified: Secondary | ICD-10-CM | POA: Diagnosis not present

## 2015-05-25 DIAGNOSIS — E1342 Other specified diabetes mellitus with diabetic polyneuropathy: Secondary | ICD-10-CM | POA: Diagnosis not present

## 2015-07-22 ENCOUNTER — Other Ambulatory Visit: Payer: Self-pay | Admitting: Family Medicine

## 2015-08-22 ENCOUNTER — Other Ambulatory Visit: Payer: Self-pay | Admitting: Family Medicine

## 2015-08-30 DIAGNOSIS — E1142 Type 2 diabetes mellitus with diabetic polyneuropathy: Secondary | ICD-10-CM | POA: Diagnosis not present

## 2015-08-30 DIAGNOSIS — B351 Tinea unguium: Secondary | ICD-10-CM | POA: Diagnosis not present

## 2015-09-19 ENCOUNTER — Other Ambulatory Visit: Payer: Self-pay | Admitting: Family Medicine

## 2015-09-27 ENCOUNTER — Encounter: Payer: Self-pay | Admitting: Family Medicine

## 2015-09-27 ENCOUNTER — Ambulatory Visit (INDEPENDENT_AMBULATORY_CARE_PROVIDER_SITE_OTHER): Payer: Medicare Other | Admitting: Family Medicine

## 2015-09-27 VITALS — BP 124/80 | Ht 60.0 in | Wt 188.4 lb

## 2015-09-27 DIAGNOSIS — Z79899 Other long term (current) drug therapy: Secondary | ICD-10-CM | POA: Diagnosis not present

## 2015-09-27 DIAGNOSIS — G44209 Tension-type headache, unspecified, not intractable: Secondary | ICD-10-CM

## 2015-09-27 DIAGNOSIS — Z23 Encounter for immunization: Secondary | ICD-10-CM

## 2015-09-27 DIAGNOSIS — E119 Type 2 diabetes mellitus without complications: Secondary | ICD-10-CM | POA: Diagnosis not present

## 2015-09-27 DIAGNOSIS — I10 Essential (primary) hypertension: Secondary | ICD-10-CM

## 2015-09-27 LAB — POCT GLYCOSYLATED HEMOGLOBIN (HGB A1C): HEMOGLOBIN A1C: 5.3

## 2015-09-27 MED ORDER — MONTELUKAST SODIUM 10 MG PO TABS: 10.0000 mg | ORAL_TABLET | Freq: Every day | ORAL | Status: DC

## 2015-09-27 MED ORDER — HYDROCODONE-ACETAMINOPHEN 5-325 MG PO TABS: 1.0000 | ORAL_TABLET | ORAL | Status: DC | PRN

## 2015-09-27 MED ORDER — VERAPAMIL HCL ER 240 MG PO TBCR: 240.0000 mg | EXTENDED_RELEASE_TABLET | Freq: Every day | ORAL | Status: DC

## 2015-09-27 MED ORDER — METFORMIN HCL 500 MG PO TABS
500.0000 mg | ORAL_TABLET | Freq: Every day | ORAL | Status: DC
Start: 2015-09-27 — End: 2016-03-19

## 2015-09-27 MED ORDER — METFORMIN HCL 500 MG PO TABS: ORAL_TABLET | ORAL | Status: DC

## 2015-09-27 MED ORDER — HYDROCHLOROTHIAZIDE 25 MG PO TABS: 25.0000 mg | ORAL_TABLET | Freq: Every day | ORAL | Status: DC

## 2015-09-27 NOTE — Progress Notes (Signed)
   Subjective:    Patient ID: Whitney Velez, female    DOB: April 25, 1950, 65 y.o.   MRN: [id number]  Diabetes She presents for her follow-up diabetic visit. She has type 2 diabetes mellitus. There are no hypoglycemic associated symptoms. There are no diabetic associated symptoms. There are no hypoglycemic complications. There are no diabetic complications. There are no known risk factors for coronary artery disease. Current diabetic treatment includes oral agent (monotherapy). She is compliant with treatment all of the time.   Last diabetic eye exam- Appointment is scheduled for next week.   Patient states that she has been have mild headaches for about 3 weeks now. Treatments tried: Tylenol, Ibuprofen With moderate relief.   Results for orders placed or performed in visit on 09/27/15  POCT glycosylated hemoglobin (Hb A1C)  Result Value Ref Range   Hemoglobin A1C 5.3    Sugar 92 88 etc  No low sugar spell  Walking a lot  Eye doc going next wk  Headaches come and go right ant fulnes and aching   ot c ibu usually stops it, usually not a headafhe person unless gets trouble with tress  Compliant with allergy medication. States overall helping. States deftly needs it.  Uses hydrocodone primarily at nighttime to help for chronic pain. Next  Has had no distinct low sugar spells. Next  Compliant with blood pressure medicine. Watch his salt intake. Generally does not miss a dose. Walking some.  Review of Systems Positive headache C prominent present illness no chest pain no back pain no abdominal pain no change in bowel habits    Objective:   Physical Exam Alert vitals stable. HEENT normal neck supple. Cerebellar function intact. Lungs clear. Heart rare rhythm. Ankles without edema. Feet pulses good sensation intact       Assessment & Plan:  Impression 1 probable tension headaches discussed #2 type 2 diabetes control 2 type discussed #3 hypertension good control. #4  hyperlipidemia status uncertain. #5 allergic rhinitis stable #6 chronic pain stable plan maintain all medications. Appropriate blood work. Flu shot today. Decrease metformin to 500 daily follow-up as scheduled WSL

## 2015-09-30 DIAGNOSIS — Z79899 Other long term (current) drug therapy: Secondary | ICD-10-CM | POA: Diagnosis not present

## 2015-09-30 DIAGNOSIS — I10 Essential (primary) hypertension: Secondary | ICD-10-CM | POA: Diagnosis not present

## 2015-09-30 DIAGNOSIS — E119 Type 2 diabetes mellitus without complications: Secondary | ICD-10-CM | POA: Diagnosis not present

## 2015-10-01 LAB — HEPATIC FUNCTION PANEL
ALT: 24 IU/L (ref 0–32)
AST: 26 IU/L (ref 0–40)
Albumin: 4.5 g/dL (ref 3.6–4.8)
Alkaline Phosphatase: 95 IU/L (ref 39–117)
Bilirubin Total: 0.4 mg/dL (ref 0.0–1.2)
Bilirubin, Direct: 0.11 mg/dL (ref 0.00–0.40)
Total Protein: 7.9 g/dL (ref 6.0–8.5)

## 2015-10-01 LAB — LIPID PANEL
CHOL/HDL RATIO: 3.6 ratio (ref 0.0–4.4)
Cholesterol, Total: 187 mg/dL (ref 100–199)
HDL: 52 mg/dL (ref >39)
LDL CALC: 106 mg/dL — AB (ref 0–99)
TRIGLYCERIDES: 143 mg/dL (ref 0–149)
VLDL CHOLESTEROL CAL: 29 mg/dL (ref 5–40)

## 2015-10-01 LAB — MICROALBUMIN / CREATININE URINE RATIO
CREATININE, UR: 199.9 mg/dL
MICROALB/CREAT RATIO: 4.8 mg/g creat (ref 0.0–30.0)
Microalbumin, Urine: 9.5 ug/mL

## 2015-10-01 LAB — BASIC METABOLIC PANEL
BUN/Creatinine Ratio: 14 (ref 11–26)
BUN: 14 mg/dL (ref 8–27)
CALCIUM: 10 mg/dL (ref 8.7–10.3)
CHLORIDE: 93 mmol/L — AB (ref 97–108)
CO2: 29 mmol/L (ref 18–29)
Creatinine, Ser: 0.99 mg/dL (ref 0.57–1.00)
GFR calc Af Amer: 68 mL/min/{1.73_m2} (ref >59)
GFR calc non Af Amer: 59 mL/min/{1.73_m2} — ABNORMAL LOW (ref >59)
GLUCOSE: 103 mg/dL — AB (ref 65–99)
POTASSIUM: 3.5 mmol/L (ref 3.5–5.2)
Sodium: 138 mmol/L (ref 134–144)

## 2015-10-12 ENCOUNTER — Encounter: Payer: Self-pay | Admitting: Family Medicine

## 2015-10-14 ENCOUNTER — Telehealth: Payer: Self-pay | Admitting: Family Medicine

## 2015-10-14 NOTE — Telephone Encounter (Signed)
Pt is calling to check on her labs done the 30th of Sept  She said if nothing to worry about just mail her a letter an copy of labs If she needs to come in call an let her know   Thanks

## 2015-10-16 NOTE — Telephone Encounter (Signed)
Letter done and i think sent

## 2015-10-18 NOTE — Telephone Encounter (Signed)
[facility] ( letter mailed out today)

## 2015-10-19 NOTE — Telephone Encounter (Signed)
Spoke with patient and informed her that the letter was mailed out to her on yesterday. Patient verbalized understanding.

## 2015-11-08 DIAGNOSIS — E1142 Type 2 diabetes mellitus with diabetic polyneuropathy: Secondary | ICD-10-CM | POA: Diagnosis not present

## 2015-11-08 DIAGNOSIS — B351 Tinea unguium: Secondary | ICD-10-CM | POA: Diagnosis not present

## 2015-12-02 ENCOUNTER — Other Ambulatory Visit: Payer: Self-pay | Admitting: Family Medicine

## 2016-01-06 ENCOUNTER — Ambulatory Visit (INDEPENDENT_AMBULATORY_CARE_PROVIDER_SITE_OTHER): Payer: Medicare Other | Admitting: Nurse Practitioner

## 2016-01-06 ENCOUNTER — Encounter: Payer: Self-pay | Admitting: Nurse Practitioner

## 2016-01-06 VITALS — Temp 102.0°F | Ht 62.0 in | Wt 183.0 lb

## 2016-01-06 DIAGNOSIS — R0600 Dyspnea, unspecified: Secondary | ICD-10-CM | POA: Diagnosis not present

## 2016-01-06 DIAGNOSIS — E119 Type 2 diabetes mellitus without complications: Secondary | ICD-10-CM | POA: Diagnosis not present

## 2016-01-06 DIAGNOSIS — R062 Wheezing: Secondary | ICD-10-CM

## 2016-01-06 DIAGNOSIS — J111 Influenza due to unidentified influenza virus with other respiratory manifestations: Secondary | ICD-10-CM | POA: Diagnosis not present

## 2016-01-06 LAB — POCT GLUCOSE (DEVICE FOR HOME USE): POC Glucose: 114 mg/dl — AB (ref 70–99)

## 2016-01-06 MED ORDER — ALBUTEROL SULFATE (2.5 MG/3ML) 0.083% IN NEBU
2.5000 mg | INHALATION_SOLUTION | Freq: Once | RESPIRATORY_TRACT | Status: AC
  Administered 2016-01-06: 2.5 mg via RESPIRATORY_TRACT

## 2016-01-06 MED ORDER — OSELTAMIVIR PHOSPHATE 75 MG PO CAPS: 75.0000 mg | ORAL_CAPSULE | Freq: Two times a day (BID) | ORAL | Status: DC

## 2016-01-06 MED ORDER — PREDNISONE 20 MG PO TABS: ORAL_TABLET | ORAL | Status: DC

## 2016-01-06 NOTE — Patient Instructions (Signed)

## 2016-01-07 ENCOUNTER — Encounter: Payer: Self-pay | Admitting: Nurse Practitioner

## 2016-01-07 NOTE — Progress Notes (Signed)
Subjective:  Presents for c/o sudden onset fever of 103, headache, cough, myalgias and fatigue that began less than 48 hours ago. Wheezing. Using her albuterol 3 x per day which helps. No V/D or abd pain. Taking fluids well. Voiding nl. BS have been stable.   Objective:   Temp(Src) 102 F (38.9 C) (Oral)  Ht 5\' 2"  (1.575 m)  Wt 183 lb (83.008 kg)  BMI 33.46 kg/m2 NAD. Alert, oriented. Fatigued in appearance. TMs clear effusion. Pharynx clear and moist. Neck supple with mild anterior slightly tender adenopathy. Lungs: initially BS diminished in general with occasional faint expiratory wheeze. SOB with talking. No tachypnea. Occasion bronchitic cough. Given albuterol 2.5 mg neb treatment. Airflow improved, no wheezing. Subjective improvement. Heart RRR.  Results for orders placed or performed in visit on 01/06/16  POCT Glucose (Device for Home Use)  Result Value Ref Range   Glucose random POC  70 - 99 mg/dL   POC Glucose 114 (A) 70 - 99 mg/dl     Assessment:  Problem List Items Addressed This Visit      Endocrine   Type 2 diabetes mellitus (HCC) (Chronic)   Relevant Orders   POCT Glucose (Device for Home Use) (Completed)    Other Visit Diagnoses    Influenza    -  Primary    Relevant Medications    oseltamivir (TAMIFLU) 75 MG capsule    Wheezing        Relevant Medications    albuterol (PROVENTIL) (2.5 MG/3ML) 0.083% nebulizer solution 2.5 mg (Completed)       Plan:  Meds ordered this encounter  Medications  . albuterol (PROVENTIL) (2.5 MG/3ML) 0.083% nebulizer solution 2.5 mg    Sig:   . oseltamivir (TAMIFLU) 75 MG capsule    Sig: Take 1 capsule (75 mg total) by mouth 2 (two) times daily.    Dispense:  10 capsule    Refill:  0    Order Specific Question:  Supervising Provider    Answer:  Mikey Kirschner [[id number]]  . predniSONE (DELTASONE) 20 MG tablet    Sig: 3 po qd x 3 d then 2 po qd x 3 d then 1 po qd x 3 d    Dispense:  18 tablet    Refill:  0    Order Specific  Question:  Supervising Provider    Answer:  Mikey Kirschner [[id number]]   Continue OTC meds and albuterol as directed. Warning signs reviewed. Seek help over the weekend if worse. Call back next week if no improvement. Monitor BS at home. Patient understands her BS will go up while on steroids.

## 2016-01-16 ENCOUNTER — Other Ambulatory Visit: Payer: Self-pay | Admitting: Nurse Practitioner

## 2016-01-16 ENCOUNTER — Telehealth: Payer: Self-pay | Admitting: Nurse Practitioner

## 2016-01-16 MED ORDER — PREDNISONE 20 MG PO TABS: ORAL_TABLET | ORAL | Status: DC

## 2016-01-16 NOTE — Telephone Encounter (Signed)
Patient was states she was wheezing at office visit

## 2016-01-16 NOTE — Telephone Encounter (Signed)
Refill sent in. Call back in 72 hours if no improvement, sooner if worse.

## 2016-01-16 NOTE — Telephone Encounter (Signed)
Patient notified and advised to call back in 72 hrs if no improvement, sooner if worse. Patient verbalized understanding.

## 2016-01-16 NOTE — Telephone Encounter (Signed)
Patient seen on 01/06/16 for the flu.  She is calling to request a refill on her prednisone for her breathing and congestion in her chest.  She said it feels like something is still sitting in her chest.  [place]

## 2016-03-19 ENCOUNTER — Ambulatory Visit (INDEPENDENT_AMBULATORY_CARE_PROVIDER_SITE_OTHER): Payer: Medicare Other | Admitting: Family Medicine

## 2016-03-19 ENCOUNTER — Encounter: Payer: Self-pay | Admitting: Family Medicine

## 2016-03-19 VITALS — BP 122/74 | Ht 60.0 in | Wt 185.0 lb

## 2016-03-19 DIAGNOSIS — J4520 Mild intermittent asthma, uncomplicated: Secondary | ICD-10-CM

## 2016-03-19 DIAGNOSIS — E119 Type 2 diabetes mellitus without complications: Secondary | ICD-10-CM | POA: Diagnosis not present

## 2016-03-19 DIAGNOSIS — J683 Other acute and subacute respiratory conditions due to chemicals, gases, fumes and vapors: Secondary | ICD-10-CM

## 2016-03-19 DIAGNOSIS — I10 Essential (primary) hypertension: Secondary | ICD-10-CM

## 2016-03-19 DIAGNOSIS — R252 Cramp and spasm: Secondary | ICD-10-CM | POA: Diagnosis not present

## 2016-03-19 DIAGNOSIS — R062 Wheezing: Secondary | ICD-10-CM | POA: Diagnosis not present

## 2016-03-19 LAB — POCT GLYCOSYLATED HEMOGLOBIN (HGB A1C): HEMOGLOBIN A1C: 5.9

## 2016-03-19 MED ORDER — VERAPAMIL HCL ER 240 MG PO TBCR: 240.0000 mg | EXTENDED_RELEASE_TABLET | Freq: Every day | ORAL | Status: DC

## 2016-03-19 MED ORDER — METFORMIN HCL 500 MG PO TABS: 500.0000 mg | ORAL_TABLET | Freq: Every day | ORAL | Status: DC

## 2016-03-19 MED ORDER — MONTELUKAST SODIUM 10 MG PO TABS: 10.0000 mg | ORAL_TABLET | Freq: Every day | ORAL | Status: DC

## 2016-03-19 MED ORDER — HYDROCHLOROTHIAZIDE 25 MG PO TABS: 25.0000 mg | ORAL_TABLET | Freq: Every day | ORAL | Status: DC

## 2016-03-19 NOTE — Progress Notes (Signed)
   Subjective:    Patient ID: Whitney Velez, female    DOB: [birth year], 66 y.o.   MRN: [id number]  Diabetes She presents for her follow-up diabetic visit. She has type 2 diabetes mellitus. Current diabetic treatment includes oral agent (monotherapy). She is compliant with treatment all of the time. She is following a diabetic diet. Her breakfast blood glucose range is generally 90-110 mg/dl. She sees a podiatrist.Eye exam is current.  overll sugars looking good   Overall breathing good, still wheezing on occas still using inhaler on occasion still compliant with single air. Feels that it helps her. Uses albuterol when necessary. Results for orders placed or performed in visit on 03/19/16  POCT glycosylated hemoglobin (Hb A1C)  Result Value Ref Range   Hemoglobin A1C 5.9   HM DIABETES EYE EXAM  Result Value Ref Range   HM Diabetic Eye Exam No Retinopathy No Retinopathy    Having bilateral leg pain. Started about 3 weeks ago. Has had in the past also when potassium was low. Pt states pain is only in the morning. Hx of supplement in the past  Patient notes compliance with blood pressure medication. No obvious side effects. Has cut down salt intake. No substantial missing doses. Next   Review of Systems No headache, no major weight loss or weight gain, no chest pain no back pain abdominal pain no change in bowel habits complete ROS otherwise negative \    Objective:   Physical Exam Alert vital stable hydration good. HEENT normal. Lungs clear. Heart rare rhythm. Ankles without edema. Pulses good sensation intact       Assessment & Plan:  Impression 1 type 2 diabetes good tight control to maintain same meds meds reviewed #2 hypertension good control meds reviewed to maintain same #3 leg pain likely cramps in nature. We'll need to assess magnesium and potassium #4 reactive airways clinically stable current meds plan diet exercise discussed. Her blood work. Medications reviewed. Recheck  as scheduled WSL

## 2016-03-20 LAB — BASIC METABOLIC PANEL
BUN / CREAT RATIO: 15 (ref 11–26)
BUN: 14 mg/dL (ref 8–27)
CHLORIDE: 93 mmol/L — AB (ref 96–106)
CO2: 27 mmol/L (ref 18–29)
CREATININE: 0.93 mg/dL (ref 0.57–1.00)
Calcium: 10.2 mg/dL (ref 8.7–10.3)
GFR calc non Af Amer: 63 mL/min/{1.73_m2} (ref >59)
GFR, EST AFRICAN AMERICAN: 73 mL/min/{1.73_m2} (ref >59)
GLUCOSE: 108 mg/dL — AB (ref 65–99)
Potassium: 3.8 mmol/L (ref 3.5–5.2)
SODIUM: 139 mmol/L (ref 134–144)

## 2016-03-20 LAB — MAGNESIUM: MAGNESIUM: 2 mg/dL (ref 1.6–2.3)

## 2016-03-24 ENCOUNTER — Encounter: Payer: Self-pay | Admitting: Family Medicine

## 2016-03-25 DIAGNOSIS — J683 Other acute and subacute respiratory conditions due to chemicals, gases, fumes and vapors: Secondary | ICD-10-CM | POA: Insufficient documentation

## 2016-04-10 DIAGNOSIS — B351 Tinea unguium: Secondary | ICD-10-CM | POA: Diagnosis not present

## 2016-04-10 DIAGNOSIS — E1142 Type 2 diabetes mellitus with diabetic polyneuropathy: Secondary | ICD-10-CM | POA: Diagnosis not present

## 2016-04-20 ENCOUNTER — Encounter: Payer: Medicare Other | Admitting: Nurse Practitioner

## 2016-05-01 ENCOUNTER — Encounter: Payer: Medicare Other | Admitting: Nurse Practitioner

## 2016-06-26 DIAGNOSIS — E1142 Type 2 diabetes mellitus with diabetic polyneuropathy: Secondary | ICD-10-CM | POA: Diagnosis not present

## 2016-06-26 DIAGNOSIS — B351 Tinea unguium: Secondary | ICD-10-CM | POA: Diagnosis not present

## 2016-07-19 LAB — HM DIABETES EYE EXAM

## 2016-09-07 ENCOUNTER — Encounter: Payer: Self-pay | Admitting: Nurse Practitioner

## 2016-09-07 ENCOUNTER — Ambulatory Visit (INDEPENDENT_AMBULATORY_CARE_PROVIDER_SITE_OTHER): Payer: Medicare Other | Admitting: Nurse Practitioner

## 2016-09-07 VITALS — BP 140/92 | Temp 98.6°F | Ht 62.0 in | Wt 187.5 lb

## 2016-09-07 DIAGNOSIS — J069 Acute upper respiratory infection, unspecified: Secondary | ICD-10-CM | POA: Diagnosis not present

## 2016-09-07 DIAGNOSIS — J029 Acute pharyngitis, unspecified: Secondary | ICD-10-CM | POA: Diagnosis not present

## 2016-09-07 DIAGNOSIS — K219 Gastro-esophageal reflux disease without esophagitis: Secondary | ICD-10-CM | POA: Diagnosis not present

## 2016-09-07 MED ORDER — AZITHROMYCIN 250 MG PO TABS: ORAL_TABLET | ORAL | 0 refills | Status: DC

## 2016-09-07 MED ORDER — RANITIDINE HCL 300 MG PO TABS: 300.0000 mg | ORAL_TABLET | Freq: Every day | ORAL | 5 refills | Status: DC

## 2016-09-07 MED ORDER — LIDOCAINE VISCOUS 2 % MT SOLN: OROMUCOSAL | 0 refills | Status: DC

## 2016-09-08 ENCOUNTER — Encounter: Payer: Self-pay | Admitting: Nurse Practitioner

## 2016-09-08 NOTE — Progress Notes (Signed)
Subjective:  Presents with complaints of extreme sore throat for the past 2 days. Describes a burning sensation "like it's on fire". No fever. Is able to drink fluids. Off-and-on generalized headache. Head congestion. Occasional nonproductive cough. No wheezing. Bilateral ear pain. Watery eyes. No vomiting diarrhea or abdominal pain. No overt acid reflux symptoms. Voiding normal limit. No rash.  Objective:   BP (!) 140/92   Temp 98.6 F (37 C) (Oral)   Ht 5\' 2"  (1.575 m)   Wt 187 lb 8 oz (85 kg)   BMI 34.29 kg/m  NAD. Alert, oriented. TMs retracted bilateral, no erythema. Pharynx mildly injected. Neck supple with mild soft anterior adenopathy. Lungs clear. Heart regular rate rhythm. Abdomen soft nondistended with mild epigastric area tenderness, no rebound or guarding.  Assessment: Upper respiratory infection  Acute pharyngitis, unspecified etiology  Gastroesophageal reflux disease without esophagitis  Plan:  Meds ordered this encounter  Medications  . ranitidine (ZANTAC) 300 MG tablet    Sig: Take 1 tablet (300 mg total) by mouth at bedtime.    Dispense:  30 tablet    Refill:  5    Order Specific Question:   Supervising Provider    Answer:   Mikey Kirschner [[id number]]  . azithromycin (ZITHROMAX Z-PAK) 250 MG tablet    Sig: Take 2 tablets (500 mg) on  Day 1,  followed by 1 tablet (250 mg) once daily on Days 2 through 5.    Dispense:  6 each    Refill:  0    Order Specific Question:   Supervising Provider    Answer:   Mikey Kirschner [[id number]]  . lidocaine (XYLOCAINE) 2 % solution    Sig: Use 15 ml q 3 hours prn throat pain; swish and spit    Dispense:  150 mL    Refill:  0    Order Specific Question:   Supervising Provider    Answer:   Mikey Kirschner [[id number]]   Lidocaine 2% gargle and spit out. Do not eat or drink until numbness has worn off. Start Zantac as directed for the next several days to cover for possible acid reflux adding to her sore throat. Warning signs reviewed.  Call back in 72 hours if no improvement, call or go to ED over the weekend if worse.

## 2016-09-11 DIAGNOSIS — E1142 Type 2 diabetes mellitus with diabetic polyneuropathy: Secondary | ICD-10-CM | POA: Diagnosis not present

## 2016-09-11 DIAGNOSIS — B351 Tinea unguium: Secondary | ICD-10-CM | POA: Diagnosis not present

## 2016-09-19 ENCOUNTER — Ambulatory Visit (INDEPENDENT_AMBULATORY_CARE_PROVIDER_SITE_OTHER): Payer: Medicare Other | Admitting: Family Medicine

## 2016-09-19 ENCOUNTER — Encounter: Payer: Self-pay | Admitting: Family Medicine

## 2016-09-19 VITALS — BP 134/80 | Ht 62.0 in | Wt 186.0 lb

## 2016-09-19 DIAGNOSIS — J4520 Mild intermittent asthma, uncomplicated: Secondary | ICD-10-CM | POA: Diagnosis not present

## 2016-09-19 DIAGNOSIS — E119 Type 2 diabetes mellitus without complications: Secondary | ICD-10-CM

## 2016-09-19 DIAGNOSIS — E785 Hyperlipidemia, unspecified: Secondary | ICD-10-CM

## 2016-09-19 DIAGNOSIS — J683 Other acute and subacute respiratory conditions due to chemicals, gases, fumes and vapors: Secondary | ICD-10-CM

## 2016-09-19 DIAGNOSIS — Z79899 Other long term (current) drug therapy: Secondary | ICD-10-CM

## 2016-09-19 LAB — POCT GLYCOSYLATED HEMOGLOBIN (HGB A1C): HEMOGLOBIN A1C: 5.7

## 2016-09-19 MED ORDER — METFORMIN HCL 500 MG PO TABS: 500.0000 mg | ORAL_TABLET | Freq: Every day | ORAL | 5 refills | Status: DC

## 2016-09-19 MED ORDER — MONTELUKAST SODIUM 10 MG PO TABS: 10.0000 mg | ORAL_TABLET | Freq: Every day | ORAL | 5 refills | Status: DC

## 2016-09-19 MED ORDER — HYDROCHLOROTHIAZIDE 25 MG PO TABS: 25.0000 mg | ORAL_TABLET | Freq: Every day | ORAL | 5 refills | Status: DC

## 2016-09-19 MED ORDER — VERAPAMIL HCL ER 240 MG PO TBCR
240.0000 mg | EXTENDED_RELEASE_TABLET | Freq: Every day | ORAL | 5 refills | Status: DC
Start: 2016-09-19 — End: 2017-02-14

## 2016-09-19 NOTE — Progress Notes (Signed)
   Subjective:    Patient ID: Whitney Velez, female    DOB: Feb 02, 1950, 66 y.o.   MRN: [id number]      Diabetes  She presents for her follow-up diabetic visit. She has type 2 diabetes mellitus. Current diabetic treatments: metformin. She is compliant with treatment all of the time. She is following a diabetic diet. Exercise: walks her dog, has lost some weight. Home blood sugar record trend: blood sugar readings between 90 - 120. She does not see a podiatrist.Eye exam is current.  Patient arrives office with numerous concerns A1C today 5.7.  Blood pressure medicine and blood pressure levels reviewed today with patient. Compliant with blood pressure medicine. States does not miss a dose. No obvious side effects. Blood pressure generally good when checked elsewhere. Watching salt intake.  Asthma overall stbe. Singulair definitely helps a lot. Uses albuterol when necessary. But only rarely at this time  Morn sugar 90s or 100s Needs refills on all meds except hydrocodone and proair inhaler.   Pt states no concerns today.   Pt declines flu vaccine today wants to wait until next month to get.    Review of Systems    No headache, no major weight loss or weight gain, no chest pain no back pain abdominal pain no change in bowel habits complete ROS otherwise negative  Objective:   Physical Exam  Alert vitals stable, NAD. Blood pressure good on repeat. HEENT normal. Lungs clear. Heart regular rate and rhythm.       Assessment & Plan:  Impression 1 hypertension great control discussed maintain same meds #2 type 2 diabetes excellent control discussed maintain same meds #3 asthma great control discussed maintain same plan diet exercise discussed. All medications refilled. Flu shot next month. WSL

## 2016-09-20 LAB — LIPID PANEL
CHOL/HDL RATIO: 3.5 ratio (ref 0.0–4.4)
Cholesterol, Total: 201 mg/dL — ABNORMAL HIGH (ref 100–199)
HDL: 57 mg/dL (ref >39)
LDL CALC: 113 mg/dL — AB (ref 0–99)
TRIGLYCERIDES: 153 mg/dL — AB (ref 0–149)
VLDL CHOLESTEROL CAL: 31 mg/dL (ref 5–40)

## 2016-09-20 LAB — MICROALBUMIN / CREATININE URINE RATIO
CREATININE, UR: 127.5 mg/dL
MICROALB/CREAT RATIO: 2.7 mg/g creat (ref 0.0–30.0)
MICROALBUM., U, RANDOM: 3.4 ug/mL

## 2016-09-20 LAB — BASIC METABOLIC PANEL
BUN / CREAT RATIO: 15 (ref 12–28)
BUN: 16 mg/dL (ref 8–27)
CHLORIDE: 91 mmol/L — AB (ref 96–106)
CO2: 29 mmol/L (ref 18–29)
Calcium: 10.1 mg/dL (ref 8.7–10.3)
Creatinine, Ser: 1.1 mg/dL — ABNORMAL HIGH (ref 0.57–1.00)
GFR calc Af Amer: 60 mL/min/{1.73_m2} (ref >59)
GFR calc non Af Amer: 52 mL/min/{1.73_m2} — ABNORMAL LOW (ref >59)
GLUCOSE: 103 mg/dL — AB (ref 65–99)
Potassium: 3.5 mmol/L (ref 3.5–5.2)
Sodium: 137 mmol/L (ref 134–144)

## 2016-09-20 LAB — HEPATIC FUNCTION PANEL
ALT: 38 IU/L — AB (ref 0–32)
AST: 30 IU/L (ref 0–40)
Albumin: 4.5 g/dL (ref 3.6–4.8)
Alkaline Phosphatase: 93 IU/L (ref 39–117)
BILIRUBIN, DIRECT: 0.13 mg/dL (ref 0.00–0.40)
Bilirubin Total: 0.5 mg/dL (ref 0.0–1.2)
TOTAL PROTEIN: 8.2 g/dL (ref 6.0–8.5)

## 2016-09-23 ENCOUNTER — Encounter: Payer: Self-pay | Admitting: Family Medicine

## 2016-10-24 ENCOUNTER — Ambulatory Visit (INDEPENDENT_AMBULATORY_CARE_PROVIDER_SITE_OTHER): Payer: Medicare Other | Admitting: *Deleted

## 2016-10-24 DIAGNOSIS — Z23 Encounter for immunization: Secondary | ICD-10-CM | POA: Diagnosis not present

## 2016-11-20 DIAGNOSIS — E1142 Type 2 diabetes mellitus with diabetic polyneuropathy: Secondary | ICD-10-CM | POA: Diagnosis not present

## 2016-11-20 DIAGNOSIS — B351 Tinea unguium: Secondary | ICD-10-CM | POA: Diagnosis not present

## 2017-01-29 DIAGNOSIS — E1142 Type 2 diabetes mellitus with diabetic polyneuropathy: Secondary | ICD-10-CM | POA: Diagnosis not present

## 2017-01-29 DIAGNOSIS — B351 Tinea unguium: Secondary | ICD-10-CM | POA: Diagnosis not present

## 2017-02-05 ENCOUNTER — Telehealth: Payer: Self-pay | Admitting: Family Medicine

## 2017-02-05 NOTE — Telephone Encounter (Signed)
Spoke with patient and informed her that we may schedule her for an appointment this week. Patient verbalized understanding.

## 2017-02-05 NOTE — Telephone Encounter (Signed)
Patient called wanting to schedule with Dr. Richardson Landry.  No regular appts for a few days.  Said she is having hip pain and shoulder pain and her butt cheek and shoulder are swollen. Said her home number is [phone], which is different than what we have in the system. Wasn't sure if this needed to be a work-in appt or next available. Thx

## 2017-02-05 NOTE — Telephone Encounter (Signed)
lz discuss with libby

## 2017-02-07 ENCOUNTER — Ambulatory Visit (HOSPITAL_COMMUNITY)
Admission: RE | Admit: 2017-02-07 | Discharge: 2017-02-07 | Disposition: A | Discharge status: Home / Self Care | Payer: Medicare Other | Source: Ambulatory Visit | Attending: Family Medicine | Admitting: Family Medicine

## 2017-02-07 ENCOUNTER — Ambulatory Visit (INDEPENDENT_AMBULATORY_CARE_PROVIDER_SITE_OTHER): Payer: Medicare Other | Admitting: Family Medicine

## 2017-02-07 ENCOUNTER — Encounter: Payer: Self-pay | Admitting: Family Medicine

## 2017-02-07 VITALS — BP 124/82 | Ht 62.0 in | Wt 183.6 lb

## 2017-02-07 DIAGNOSIS — M130 Polyarthritis, unspecified: Secondary | ICD-10-CM

## 2017-02-07 DIAGNOSIS — M25551 Pain in right hip: Secondary | ICD-10-CM | POA: Diagnosis not present

## 2017-02-07 DIAGNOSIS — M19041 Primary osteoarthritis, right hand: Secondary | ICD-10-CM | POA: Diagnosis not present

## 2017-02-07 DIAGNOSIS — M1711 Unilateral primary osteoarthritis, right knee: Secondary | ICD-10-CM | POA: Diagnosis not present

## 2017-02-07 DIAGNOSIS — M25511 Pain in right shoulder: Secondary | ICD-10-CM | POA: Diagnosis not present

## 2017-02-07 DIAGNOSIS — D649 Anemia, unspecified: Secondary | ICD-10-CM | POA: Diagnosis not present

## 2017-02-07 MED ORDER — NABUMETONE 750 MG PO TABS: 750.0000 mg | ORAL_TABLET | Freq: Two times a day (BID) | ORAL | 0 refills | Status: DC

## 2017-02-07 NOTE — Progress Notes (Signed)
   Subjective:    Patient ID: Whitney Velez, female    DOB: [birth year], 67 y.o.   MRN: [id number] Patient arrives office with complaints of multiple joint problems/pains in multiple areas. HPI Patient notes no major chronic history of arthritis challenges. Patient arrives with right shoulder and right hip pain for about 2 weeks.  Right shoulder very painful  Right knee swollen  Painful right post buttock painfu;l  Used ibuprofen and aleave, helped some but  Not a lot , cont to work  Right mid finger painful and tender  Right handed   No hx of arthritis challenges and pain and dixcomfort  No fam hx of arthritis or other issues        Review of Systems No headache, no major weight loss or weight gain, no chest pain no back pain abdominal pain no change in bowel habits complete ROS otherwise negative     Objective:   Physical Exam  Alert vitals stable, NAD. Blood pressure good on repeat. HEENT normal. Lungs clear. Heart regular rate and rhythm. Right shoulder positive impingement sign right hand middle finger trigger finger noted right knee crepitations evident moderate effusion. Right hip significant pain with rotation      Assessment & Plan:  Impression curious polyarthritis rather sudden onset. Etiology unclear. Long discussion held. I think this does deserve a workup plan x-rays of involved joints. Appropriate blood work. Trial of Relafen 75 twice a day with food. 25 minutes spent greater than 50% of time in discussion. Further recommendations based results

## 2017-02-08 LAB — CBC WITH DIFFERENTIAL/PLATELET
BASOS ABS: 0 10*3/uL (ref 0.0–0.2)
Basos: 0 %
EOS (ABSOLUTE): 1.1 10*3/uL — ABNORMAL HIGH (ref 0.0–0.4)
Eos: 9 %
Hematocrit: 38.2 % (ref 34.0–46.6)
Hemoglobin: 12.4 g/dL (ref 11.1–15.9)
IMMATURE GRANULOCYTES: 0 %
Immature Grans (Abs): 0 10*3/uL (ref 0.0–0.1)
LYMPHS: 27 %
Lymphocytes Absolute: 3 10*3/uL (ref 0.7–3.1)
MCH: 28 pg (ref 26.6–33.0)
MCHC: 32.5 g/dL (ref 31.5–35.7)
MCV: 86 fL (ref 79–97)
Monocytes Absolute: 1.1 10*3/uL — ABNORMAL HIGH (ref 0.1–0.9)
Monocytes: 10 %
NEUTROS PCT: 54 %
Neutrophils Absolute: 6.2 10*3/uL (ref 1.4–7.0)
PLATELETS: 335 10*3/uL (ref 150–379)
RBC: 4.43 x10E6/uL (ref 3.77–5.28)
RDW: 15.1 % (ref 12.3–15.4)
WBC: 11.4 10*3/uL — ABNORMAL HIGH (ref 3.4–10.8)

## 2017-02-08 LAB — SEDIMENTATION RATE: SED RATE: 22 mm/h (ref 0–40)

## 2017-02-08 LAB — ANA: ANA: NEGATIVE

## 2017-02-08 LAB — RHEUMATOID FACTOR

## 2017-02-14 ENCOUNTER — Encounter: Payer: Self-pay | Admitting: Family Medicine

## 2017-02-14 ENCOUNTER — Ambulatory Visit (INDEPENDENT_AMBULATORY_CARE_PROVIDER_SITE_OTHER): Payer: Medicare Other | Admitting: Family Medicine

## 2017-02-14 VITALS — BP 124/80 | Ht 62.0 in | Wt 185.0 lb

## 2017-02-14 DIAGNOSIS — M7591 Shoulder lesion, unspecified, right shoulder: Secondary | ICD-10-CM

## 2017-02-14 DIAGNOSIS — E119 Type 2 diabetes mellitus without complications: Secondary | ICD-10-CM | POA: Diagnosis not present

## 2017-02-14 LAB — POCT GLYCOSYLATED HEMOGLOBIN (HGB A1C): Hemoglobin A1C: 5.5

## 2017-02-14 MED ORDER — METFORMIN HCL 500 MG PO TABS: 500.0000 mg | ORAL_TABLET | Freq: Every day | ORAL | 5 refills | Status: DC

## 2017-02-14 MED ORDER — HYDROCHLOROTHIAZIDE 25 MG PO TABS: 25.0000 mg | ORAL_TABLET | Freq: Every day | ORAL | 5 refills | Status: DC

## 2017-02-14 MED ORDER — VERAPAMIL HCL ER 240 MG PO TBCR: 240.0000 mg | EXTENDED_RELEASE_TABLET | Freq: Every day | ORAL | 5 refills | Status: DC

## 2017-02-14 MED ORDER — MONTELUKAST SODIUM 10 MG PO TABS: 10.0000 mg | ORAL_TABLET | Freq: Every day | ORAL | 5 refills | Status: DC

## 2017-02-14 NOTE — Progress Notes (Signed)
   Subjective:    Patient ID: Whitney Velez, female    DOB: [birth year], 67 y.o.   MRN: [id number]  HPIFollow up on polyarthritis. Pt had bloodwork and xrays done last week. Here today to go over results. Taking nabumetone 750 bid. Med is helping with pain.   Patient handling the Relafen well. No obvious side effects. States it has helped her arthritis quite a bit. Next  Still having some shoulder pain but significantly improved. Next  Patient trying hard to get some regular exercise and now  Pt states no other concerns today.    Results for orders placed or performed in visit on 02/14/17  POCT glycosylated hemoglobin (Hb A1C)  Result Value Ref Range   Hemoglobin A1C 5.5     Review of Systems No headache, no major weight loss or weight gain, no chest pain no back pain abdominal pain no change in bowel habits complete ROS otherwise negative     Objective:   Physical Exam  Alert vitals stable, NAD. Blood pressure good on repeat. HEENT normal. Lungs clear. Heart regular rate and rhythm. Plus minus impingement sign right shoulder hands some Heberden's nodes noted. Right knee some crepitations      Assessment & Plan:  Impression 1 osteoarthritis arthritis 1 discussion held. Blood work strongly points away from more serious forms of arthritis. X-ray support this. #2 lytic lesions shoulder. Discussed. This deserves further in imaging a scan long discussion held we'll press on and order this. Advised patient her insurance may be resistant but we need to work on making sure the scans improved #3 type 2 diabetes controlled tight 5.5 A1c WSL

## 2017-02-19 ENCOUNTER — Ambulatory Visit (HOSPITAL_COMMUNITY): Admission: RE | Admit: 2017-02-19 | Payer: Medicare Other | Source: Ambulatory Visit

## 2017-02-22 ENCOUNTER — Ambulatory Visit (HOSPITAL_COMMUNITY)
Admission: RE | Admit: 2017-02-22 | Discharge: 2017-02-22 | Disposition: A | Discharge status: Home / Self Care | Payer: Medicare Other | Source: Ambulatory Visit | Attending: Family Medicine | Admitting: Family Medicine

## 2017-02-22 ENCOUNTER — Encounter (HOSPITAL_COMMUNITY): Payer: Self-pay

## 2017-02-22 DIAGNOSIS — M7591 Shoulder lesion, unspecified, right shoulder: Secondary | ICD-10-CM | POA: Insufficient documentation

## 2017-02-22 DIAGNOSIS — M19011 Primary osteoarthritis, right shoulder: Secondary | ICD-10-CM | POA: Insufficient documentation

## 2017-03-15 ENCOUNTER — Other Ambulatory Visit: Payer: Self-pay | Admitting: Family Medicine

## 2017-03-20 ENCOUNTER — Encounter: Payer: Medicare Other | Admitting: Nurse Practitioner

## 2017-04-09 DIAGNOSIS — B351 Tinea unguium: Secondary | ICD-10-CM | POA: Diagnosis not present

## 2017-04-09 DIAGNOSIS — E1142 Type 2 diabetes mellitus with diabetic polyneuropathy: Secondary | ICD-10-CM | POA: Diagnosis not present

## 2017-06-18 ENCOUNTER — Other Ambulatory Visit: Payer: Self-pay | Admitting: Family Medicine

## 2017-08-14 ENCOUNTER — Encounter: Payer: Self-pay | Admitting: Family Medicine

## 2017-08-14 ENCOUNTER — Ambulatory Visit (INDEPENDENT_AMBULATORY_CARE_PROVIDER_SITE_OTHER): Payer: Medicare Other | Admitting: Family Medicine

## 2017-08-14 VITALS — BP 120/66 | Ht 62.0 in | Wt 176.4 lb

## 2017-08-14 DIAGNOSIS — J683 Other acute and subacute respiratory conditions due to chemicals, gases, fumes and vapors: Secondary | ICD-10-CM | POA: Diagnosis not present

## 2017-08-14 DIAGNOSIS — Z79899 Other long term (current) drug therapy: Secondary | ICD-10-CM | POA: Diagnosis not present

## 2017-08-14 DIAGNOSIS — I10 Essential (primary) hypertension: Secondary | ICD-10-CM

## 2017-08-14 DIAGNOSIS — E119 Type 2 diabetes mellitus without complications: Secondary | ICD-10-CM | POA: Diagnosis not present

## 2017-08-14 LAB — POCT GLYCOSYLATED HEMOGLOBIN (HGB A1C): Hemoglobin A1C: 5.4

## 2017-08-14 MED ORDER — ALBUTEROL SULFATE HFA 108 (90 BASE) MCG/ACT IN AERS: INHALATION_SPRAY | RESPIRATORY_TRACT | 0 refills | Status: DC

## 2017-08-14 MED ORDER — VERAPAMIL HCL ER 240 MG PO TBCR: 240.0000 mg | EXTENDED_RELEASE_TABLET | Freq: Every day | ORAL | 5 refills | Status: DC

## 2017-08-14 MED ORDER — NABUMETONE 750 MG PO TABS: 750.0000 mg | ORAL_TABLET | Freq: Two times a day (BID) | ORAL | 5 refills | Status: DC

## 2017-08-14 MED ORDER — HYDROCHLOROTHIAZIDE 25 MG PO TABS: 25.0000 mg | ORAL_TABLET | Freq: Every day | ORAL | 5 refills | Status: DC

## 2017-08-14 MED ORDER — METFORMIN HCL 500 MG PO TABS: 500.0000 mg | ORAL_TABLET | Freq: Every day | ORAL | 5 refills | Status: DC

## 2017-08-14 MED ORDER — MONTELUKAST SODIUM 10 MG PO TABS: 10.0000 mg | ORAL_TABLET | Freq: Every day | ORAL | 5 refills | Status: DC

## 2017-08-14 NOTE — Progress Notes (Signed)
   Subjective:    Patient ID: Whitney Velez, female    DOB: 1950-08-02, 67 y.o.   MRN: [id number]  Diabetes  She presents for her follow-up diabetic visit. She has type 2 diabetes mellitus. No MedicAlert identification noted. She has not had a previous visit with a dietitian. She sees a podiatrist.Eye exam is current (April 2018 ).   Patient states no concerns this visit.  Results for orders placed or performed in visit on 08/14/17  POCT HgB A1C  Result Value Ref Range   Hemoglobin A1C 5.4    Patient claims compliance with diabetes medication. No obvious side effects. Reports no substantial low sugar spells. Most numbers are generally in good range when checked fasting. Generally does not miss a dose of medication. Watching diabetic diet closely  Blood pressure medicine and blood pressure levels reviewed today with patient. Compliant with blood pressure medicine. States does not miss a dose. No obvious side effects. Blood pressure generally good when checked elsewhere. Watching salt intake.   Sugar mostly 103 or 106  Asthma overall stable. Uses inhaler on occaion, maybe once per two weeks or so.Needs refill on inhaler. Generally this flares up when she is sick or during periods of allergy. Allergies overall stable as long as she maintains Singulair  Shoulder pain left.Recalls no sudden injury. Relafen helps it. Nearly out of that medication.  Review of Systems No headache, no major weight loss or weight gain, no chest pain no back pain abdominal pain no change in bowel habits complete ROS otherwise negative     Objective:   Physical Exam Alert and oriented, vitals reviewed and stable, NAD ENT-TM's and ext canals WNL bilat via otoscopic exam Soft palate, tonsils and post pharynx WNL via oropharyngeal exam Neck-symmetric, no masses; thyroid nonpalpable and nontender Pulmonary-no tachypnea or accessory muscle use; Clear without wheezes via auscultation Card--no abnrml murmurs,  rhythm reg and rate WNL Carotid pulses symmetric, without bruits Positive left shoulder impingement sign       Assessment & Plan:  Impression 1 type 2 diabetes good control discussed maintain same next  #2 hypertension good control discussed compliance with meds discussed maintain same next  #3 asthma clinically stable particularly one Singulair to maintain albuterol when necessary  #4 left shoulder impingement. Codman's exercise discussed in encourage. Use Relafen when necessary.  Follow-up in 6 months. Appropriate yearly blood work further recommendations based on results

## 2017-08-15 LAB — LIPID PANEL
CHOL/HDL RATIO: 2.8 ratio (ref 0.0–4.4)
Cholesterol, Total: 188 mg/dL (ref 100–199)
HDL: 66 mg/dL (ref >39)
LDL CALC: 106 mg/dL — AB (ref 0–99)
TRIGLYCERIDES: 82 mg/dL (ref 0–149)
VLDL Cholesterol Cal: 16 mg/dL (ref 5–40)

## 2017-08-15 LAB — BASIC METABOLIC PANEL
BUN/Creatinine Ratio: 13 (ref 12–28)
BUN: 13 mg/dL (ref 8–27)
CALCIUM: 10.1 mg/dL (ref 8.7–10.3)
CHLORIDE: 97 mmol/L (ref 96–106)
CO2: 26 mmol/L (ref 20–29)
Creatinine, Ser: 1.02 mg/dL — ABNORMAL HIGH (ref 0.57–1.00)
GFR calc Af Amer: 65 mL/min/{1.73_m2} (ref >59)
GFR calc non Af Amer: 56 mL/min/{1.73_m2} — ABNORMAL LOW (ref >59)
GLUCOSE: 89 mg/dL (ref 65–99)
Potassium: 3.3 mmol/L — ABNORMAL LOW (ref 3.5–5.2)
Sodium: 139 mmol/L (ref 134–144)

## 2017-08-15 LAB — HEPATIC FUNCTION PANEL
ALT: 13 IU/L (ref 0–32)
AST: 22 IU/L (ref 0–40)
Albumin: 4.3 g/dL (ref 3.6–4.8)
Alkaline Phosphatase: 82 IU/L (ref 39–117)
Bilirubin Total: 0.3 mg/dL (ref 0.0–1.2)
Bilirubin, Direct: 0.11 mg/dL (ref 0.00–0.40)
Total Protein: 7.6 g/dL (ref 6.0–8.5)

## 2017-08-15 LAB — MICROALBUMIN / CREATININE URINE RATIO
CREATININE, UR: 72.3 mg/dL
Microalbumin, Urine: <3 ug/mL

## 2017-08-18 ENCOUNTER — Encounter: Payer: Self-pay | Admitting: Family Medicine

## 2017-10-09 ENCOUNTER — Telehealth: Payer: Self-pay | Admitting: Family Medicine

## 2017-10-09 ENCOUNTER — Other Ambulatory Visit: Payer: Self-pay | Admitting: Nurse Practitioner

## 2017-10-09 MED ORDER — ALBUTEROL SULFATE (2.5 MG/3ML) 0.083% IN NEBU: INHALATION_SOLUTION | RESPIRATORY_TRACT | 2 refills | Status: DC

## 2017-10-09 NOTE — Telephone Encounter (Signed)
Tried to call no answer 10/09/2017

## 2017-10-09 NOTE — Telephone Encounter (Signed)
Requesting Rx for solution that goes in nebulizer machine.  [place]  She did not want to provide a number to reach her back at, she said she will call back later to check on this.

## 2017-10-09 NOTE — Telephone Encounter (Signed)
done

## 2017-10-16 ENCOUNTER — Ambulatory Visit (INDEPENDENT_AMBULATORY_CARE_PROVIDER_SITE_OTHER): Payer: Medicare Other

## 2017-10-16 DIAGNOSIS — Z23 Encounter for immunization: Secondary | ICD-10-CM | POA: Diagnosis not present

## 2017-10-31 ENCOUNTER — Encounter: Payer: Self-pay | Admitting: Family Medicine

## 2017-10-31 ENCOUNTER — Ambulatory Visit (INDEPENDENT_AMBULATORY_CARE_PROVIDER_SITE_OTHER): Payer: Medicare Other | Admitting: Family Medicine

## 2017-10-31 VITALS — BP 150/96 | Ht 62.0 in | Wt 181.0 lb

## 2017-10-31 DIAGNOSIS — I10 Essential (primary) hypertension: Secondary | ICD-10-CM | POA: Diagnosis not present

## 2017-10-31 MED ORDER — AMLODIPINE BESYLATE 10 MG PO TABS: 10.0000 mg | ORAL_TABLET | Freq: Every day | ORAL | 4 refills | Status: DC

## 2017-10-31 NOTE — Progress Notes (Signed)
   Subjective:    Patient ID: Whitney Velez, female    DOB: 1950/09/07, 67 y.o.   MRN: [id number]  Hypertension  This is a chronic problem. Treatments tried: verapamil, hctz.  This patient has been on verapamil and HCTZ for her blood pressure she does try to watch her diet to some degree she does suffer with hypertension arthritic discomforts and diabetes.  Patient also has had mild renal dysfunction She relates that some of her blood pressure readings have been in the 200/110 range and this is much higher than it has been in the past she does state that she uses a wrist cuff and unfortunately does not use it in the correct manner PMH benign  Review of Systems She denies any headaches no chest tightness pressure pain or shortness of breath    Objective:   Physical Exam Lungs are clear respiratory rate normal heart regular pulse normal extremities no edema skin warm dry  Blood pressure was checked twice with large cuff proper manner.     Assessment & Plan:  HTN subpar control Proper diet discussed physical activity as tolerated Previous lab work shows mild kidney dysfunction We will repeat 7 before follow-up visit May need to consider potassium supplementation Discontinue verapamil Prescribed amlodipine 10 mg daily Follow-up office visit in a few weeks If any problems notify us

## 2017-10-31 NOTE — Patient Instructions (Signed)
DASH Eating Plan DASH stands for "Dietary Approaches to Stop Hypertension." The DASH eating plan is a healthy eating plan that has been shown to reduce high blood pressure (hypertension). It may also reduce your risk for type 2 diabetes, heart disease, and stroke. The DASH eating plan may also help with weight loss. What are tips for following this plan? General guidelines  Avoid eating more than 2,300 mg (milligrams) of salt (sodium) a day. If you have hypertension, you may need to reduce your sodium intake to 1,500 mg a day.  Limit alcohol intake to no more than 1 drink a day for nonpregnant women and 2 drinks a day for men. One drink equals 12 oz of beer, 5 oz of wine, or 1 oz of hard liquor.  Work with your health care provider to maintain a healthy body weight or to lose weight. Ask what an ideal weight is for you.  Get at least 30 minutes of exercise that causes your heart to beat faster (aerobic exercise) most days of the week. Activities may include walking, swimming, or biking.  Work with your health care provider or diet and nutrition specialist (dietitian) to adjust your eating plan to your individual calorie needs. Reading food labels  Check food labels for the amount of sodium per serving. Choose foods with less than 5 percent of the Daily Value of sodium. Generally, foods with less than 300 mg of sodium per serving fit into this eating plan.  To find whole grains, look for the word "whole" as the first word in the ingredient list. Shopping  Buy products labeled as "low-sodium" or "no salt added."  Buy fresh foods. Avoid canned foods and premade or frozen meals. Cooking  Avoid adding salt when cooking. Use salt-free seasonings or herbs instead of table salt or sea salt. Check with your health care provider or pharmacist before using salt substitutes.  Do not fry foods. Cook foods using healthy methods such as baking, boiling, grilling, and broiling instead.  Cook with  heart-healthy oils, such as olive, canola, soybean, or sunflower oil. Meal planning   Eat a balanced diet that includes: ? 5 or more servings of fruits and vegetables each day. At each meal, try to fill half of your plate with fruits and vegetables. ? Up to 6-8 servings of whole grains each day. ? Less than 6 oz of lean meat, poultry, or fish each day. A 3-oz serving of meat is about the same size as a deck of cards. One egg equals 1 oz. ? 2 servings of low-fat dairy each day. ? A serving of nuts, seeds, or beans 5 times each week. ? Heart-healthy fats. Healthy fats called Omega-3 fatty acids are found in foods such as flaxseeds and coldwater fish, like sardines, salmon, and mackerel.  Limit how much you eat of the following: ? Canned or prepackaged foods. ? Food that is high in trans fat, such as fried foods. ? Food that is high in saturated fat, such as fatty meat. ? Sweets, desserts, sugary drinks, and other foods with added sugar. ? Full-fat dairy products.  Do not salt foods before eating.  Try to eat at least 2 vegetarian meals each week.  Eat more home-cooked food and less restaurant, buffet, and fast food.  When eating at a restaurant, ask that your food be prepared with less salt or no salt, if possible. What foods are recommended? The items listed may not be a complete list. Talk with your dietitian about what   dietary choices are best for you. Grains Whole-grain or whole-wheat bread. Whole-grain or whole-wheat pasta. Brown rice. Oatmeal. Quinoa. Bulgur. Whole-grain and low-sodium cereals. Pita bread. Low-fat, low-sodium crackers. Whole-wheat flour tortillas. Vegetables Fresh or frozen vegetables (raw, steamed, roasted, or grilled). Low-sodium or reduced-sodium tomato and vegetable juice. Low-sodium or reduced-sodium tomato sauce and tomato paste. Low-sodium or reduced-sodium canned vegetables. Fruits All fresh, dried, or frozen fruit. Canned fruit in natural juice (without  added sugar). Meat and other protein foods Skinless chicken or turkey. Ground chicken or turkey. Pork with fat trimmed off. Fish and seafood. Egg whites. Dried beans, peas, or lentils. Unsalted nuts, nut butters, and seeds. Unsalted canned beans. Lean cuts of beef with fat trimmed off. Low-sodium, lean deli meat. Dairy Low-fat (1%) or fat-free (skim) milk. Fat-free, low-fat, or reduced-fat cheeses. Nonfat, low-sodium ricotta or cottage cheese. Low-fat or nonfat yogurt. Low-fat, low-sodium cheese. Fats and oils Soft margarine without trans fats. Vegetable oil. Low-fat, reduced-fat, or light mayonnaise and salad dressings (reduced-sodium). Canola, safflower, olive, soybean, and sunflower oils. Avocado. Seasoning and other foods Herbs. Spices. Seasoning mixes without salt. Unsalted popcorn and pretzels. Fat-free sweets. What foods are not recommended? The items listed may not be a complete list. Talk with your dietitian about what dietary choices are best for you. Grains Baked goods made with fat, such as croissants, muffins, or some breads. Dry pasta or rice meal packs. Vegetables Creamed or fried vegetables. Vegetables in a cheese sauce. Regular canned vegetables (not low-sodium or reduced-sodium). Regular canned tomato sauce and paste (not low-sodium or reduced-sodium). Regular tomato and vegetable juice (not low-sodium or reduced-sodium). Pickles. Olives. Fruits Canned fruit in a light or heavy syrup. Fried fruit. Fruit in cream or butter sauce. Meat and other protein foods Fatty cuts of meat. Ribs. Fried meat. Bacon. Sausage. Bologna and other processed lunch meats. Salami. Fatback. Hotdogs. Bratwurst. Salted nuts and seeds. Canned beans with added salt. Canned or smoked fish. Whole eggs or egg yolks. Chicken or turkey with skin. Dairy Whole or 2% milk, cream, and half-and-half. Whole or full-fat cream cheese. Whole-fat or sweetened yogurt. Full-fat cheese. Nondairy creamers. Whipped toppings.  Processed cheese and cheese spreads. Fats and oils Butter. Stick margarine. Lard. Shortening. Ghee. Bacon fat. Tropical oils, such as coconut, palm kernel, or palm oil. Seasoning and other foods Salted popcorn and pretzels. Onion salt, garlic salt, seasoned salt, table salt, and sea salt. Worcestershire sauce. Tartar sauce. Barbecue sauce. Teriyaki sauce. Soy sauce, including reduced-sodium. Steak sauce. Canned and packaged gravies. Fish sauce. Oyster sauce. Cocktail sauce. Horseradish that you find on the shelf. Ketchup. Mustard. Meat flavorings and tenderizers. Bouillon cubes. Hot sauce and Tabasco sauce. Premade or packaged marinades. Premade or packaged taco seasonings. Relishes. Regular salad dressings. Where to find more information:  National Heart, Lung, and Blood Institute: www.nhlbi.nih.gov  American Heart Association: www.heart.org Summary  The DASH eating plan is a healthy eating plan that has been shown to reduce high blood pressure (hypertension). It may also reduce your risk for type 2 diabetes, heart disease, and stroke.  With the DASH eating plan, you should limit salt (sodium) intake to 2,300 mg a day. If you have hypertension, you may need to reduce your sodium intake to 1,500 mg a day.  When on the DASH eating plan, aim to eat more fresh fruits and vegetables, whole grains, lean proteins, low-fat dairy, and heart-healthy fats.  Work with your health care provider or diet and nutrition specialist (dietitian) to adjust your eating plan to your individual   calorie needs. This information is not intended to replace advice given to you by your health care provider. Make sure you discuss any questions you have with your health care provider. Document Released: 12/06/2011 Document Revised: 12/10/2016 Document Reviewed: 12/10/2016 Elsevier Interactive Patient Education  2017 Elsevier Inc.  

## 2017-11-01 NOTE — Addendum Note (Signed)
Addended by: Karle Barr on: 11/01/2017 09:12 AM   Modules accepted: Orders

## 2017-11-01 NOTE — Progress Notes (Signed)
I called unable to receive in coming calls. I mailed the orders with a note to have done prior to the appt with Dr. Remo Lipps on 11/11/2017.CS

## 2017-11-11 ENCOUNTER — Ambulatory Visit: Payer: Medicare Other | Admitting: Family Medicine

## 2017-11-11 DIAGNOSIS — I10 Essential (primary) hypertension: Secondary | ICD-10-CM | POA: Diagnosis not present

## 2017-11-12 ENCOUNTER — Encounter: Payer: Self-pay | Admitting: Family Medicine

## 2017-11-12 ENCOUNTER — Ambulatory Visit (INDEPENDENT_AMBULATORY_CARE_PROVIDER_SITE_OTHER): Payer: Medicare Other | Admitting: Family Medicine

## 2017-11-12 VITALS — BP 128/72 | Ht 62.0 in | Wt 179.0 lb

## 2017-11-12 DIAGNOSIS — M7541 Impingement syndrome of right shoulder: Secondary | ICD-10-CM

## 2017-11-12 DIAGNOSIS — M25561 Pain in right knee: Secondary | ICD-10-CM

## 2017-11-12 DIAGNOSIS — I10 Essential (primary) hypertension: Secondary | ICD-10-CM

## 2017-11-12 DIAGNOSIS — M1711 Unilateral primary osteoarthritis, right knee: Secondary | ICD-10-CM | POA: Diagnosis not present

## 2017-11-12 LAB — BASIC METABOLIC PANEL
BUN/Creatinine Ratio: 14 (ref 12–28)
BUN: 17 mg/dL (ref 8–27)
CALCIUM: 10.5 mg/dL — AB (ref 8.7–10.3)
CHLORIDE: 97 mmol/L (ref 96–106)
CO2: 23 mmol/L (ref 20–29)
Creatinine, Ser: 1.22 mg/dL — ABNORMAL HIGH (ref 0.57–1.00)
GFR calc non Af Amer: 45 mL/min/{1.73_m2} — ABNORMAL LOW (ref >59)
GFR, EST AFRICAN AMERICAN: 52 mL/min/{1.73_m2} — AB (ref >59)
GLUCOSE: 98 mg/dL (ref 65–99)
POTASSIUM: 3.8 mmol/L (ref 3.5–5.2)
Sodium: 138 mmol/L (ref 134–144)

## 2017-11-12 MED ORDER — METHYLPREDNISOLONE ACETATE 40 MG/ML IJ SUSP: 40.0000 mg | Freq: Once | INTRAMUSCULAR | Status: DC

## 2017-11-12 NOTE — Progress Notes (Signed)
   Subjective:    Patient ID: Whitney Velez, female    DOB: 01/31/1950, 67 y.o.   MRN: [id number] Patient arrives office with numerous concerns on your blood pressure medication. Hypertension  This is a chronic problem. Treatments tried: amlodipine, hctz.   Patient on new blood pressure medication.  Tolerating well.  Brings in her own cough today.  It does not worked very well off by about 20 points   Right knee swelling and pain.   Still having right shoulder pain. Takes relafen.    Review of Systems No headache, no major weight loss or weight gain, no chest pain no back pain abdominal pain no change in bowel habits complete ROS otherwise negative     Objective:   Physical Exam  Alert vitals stable, NAD. Blood pressure good on repeat. HEENT normal. Lungs clear. Heart regular rate and rhythm.   Blood pressure improved on repeat  Right knee was prepped draped and anesthetized and injected with 1 cc steroid 3 cc Xylocaine  Right shoulder was prepped draped and anesthetized inject with 1 cc Depo-Medrol and 2 cc Xylocaine     Assessment & Plan:  Impression hypertension improved on current regimen.  Encouraged to discard wrist monitor.  Maintain same medication  2.  Right shoulder bursitis/right knee arthritis.  Local measures discussed

## 2017-12-30 ENCOUNTER — Other Ambulatory Visit: Payer: Self-pay | Admitting: Family Medicine

## 2018-01-28 DIAGNOSIS — E1142 Type 2 diabetes mellitus with diabetic polyneuropathy: Secondary | ICD-10-CM | POA: Diagnosis not present

## 2018-01-28 DIAGNOSIS — B351 Tinea unguium: Secondary | ICD-10-CM | POA: Diagnosis not present

## 2018-02-14 ENCOUNTER — Ambulatory Visit: Payer: Medicare Other | Admitting: Family Medicine

## 2018-03-03 ENCOUNTER — Other Ambulatory Visit: Payer: Self-pay | Admitting: Family Medicine

## 2018-03-17 ENCOUNTER — Encounter: Payer: Self-pay | Admitting: Family Medicine

## 2018-03-17 ENCOUNTER — Ambulatory Visit (INDEPENDENT_AMBULATORY_CARE_PROVIDER_SITE_OTHER): Payer: Medicare Other | Admitting: Family Medicine

## 2018-03-17 VITALS — BP 126/72 | Ht 62.0 in | Wt 178.0 lb

## 2018-03-17 DIAGNOSIS — I10 Essential (primary) hypertension: Secondary | ICD-10-CM | POA: Diagnosis not present

## 2018-03-17 DIAGNOSIS — E1129 Type 2 diabetes mellitus with other diabetic kidney complication: Secondary | ICD-10-CM

## 2018-03-17 DIAGNOSIS — J683 Other acute and subacute respiratory conditions due to chemicals, gases, fumes and vapors: Secondary | ICD-10-CM | POA: Diagnosis not present

## 2018-03-17 DIAGNOSIS — Z1231 Encounter for screening mammogram for malignant neoplasm of breast: Secondary | ICD-10-CM | POA: Diagnosis not present

## 2018-03-17 DIAGNOSIS — E119 Type 2 diabetes mellitus without complications: Secondary | ICD-10-CM

## 2018-03-17 DIAGNOSIS — R809 Proteinuria, unspecified: Secondary | ICD-10-CM | POA: Diagnosis not present

## 2018-03-17 LAB — POCT GLYCOSYLATED HEMOGLOBIN (HGB A1C): Hemoglobin A1C: 4.7

## 2018-03-17 MED ORDER — AMLODIPINE BESYLATE 10 MG PO TABS: 10.0000 mg | ORAL_TABLET | Freq: Every day | ORAL | 4 refills | Status: DC

## 2018-03-17 MED ORDER — METFORMIN HCL 500 MG PO TABS: 500.0000 mg | ORAL_TABLET | Freq: Every day | ORAL | 5 refills | Status: DC

## 2018-03-17 MED ORDER — HYDROCHLOROTHIAZIDE 25 MG PO TABS: 25.0000 mg | ORAL_TABLET | Freq: Every day | ORAL | 5 refills | Status: DC

## 2018-03-17 NOTE — Progress Notes (Signed)
   Subjective:    Patient ID: Whitney Velez, female    DOB: [birth year], 68 y.o.   MRN: [id number]  HPI  Patient is here today to follow up chronic illnesses.She is on metformin 500 mg one daily for Dm. She states she eats healthy and gets some exercise by walking.She does see podiatry.  Patient claims compliance with diabetes medication. No obvious side effects. Reports no substantial low sugar spells. Most numbers are generally in good range when checked fasting. Generally does not miss a dose of medication. Watching diabetic diet closely  Blood pressure medicine and blood pressure levels reviewed today with patient. Compliant with blood pressure medicine. States does not miss a dose. No obvious side effects. Blood pressure generally good when checked elsewhere. Watching salt intake.  Fasting numbers 97 and 101   Results for orders placed or performed in visit on 03/17/18  POCT glycosylated hemoglobin (Hb A1C)  Result Value Ref Range   Hemoglobin A1C 4.7    walks every day overall does well with that  Diet overall good   Arthritis med  Taking still, defineitely helped. Worse in the knees , acheya t times, sharp other, worse with movement.  No obvious side effects with medication Review of Systems No headache, no major weight loss or weight gain, no chest pain no back pain abdominal pain no change in bowel habits complete ROS otherwise negative     Objective:   Physical Exam  Alert and oriented, vitals reviewed and stable, NAD ENT-TM's and ext canals WNL bilat via otoscopic exam Soft palate, tonsils and post pharynx WNL via oropharyngeal exam Neck-symmetric, no masses; thyroid nonpalpable and nontender Pulmonary-no tachypnea or accessory muscle use; Clear without wheezes via auscultation Card--no abnrml murmurs, rhythm reg and rate WNL Carotid pulses symmetric, without bruits Knees substantial crepitations bilateral      Assessment & Plan:  Impression hypertension good  control.  Discussed.  Compliance discussed with salt intake discussed  2.  Type 2 diabetes excellent A1c.  Fasting sugars good.  Compliance with diet and exercise discussed and encouraged  3.  Chronic arthritis ongoing  4.  Asthma clinically stable uses inhaler maybe twice per week at this time  Medications refilled diet exercise discussed follow-up in 6 months.  Mammogram scheduled

## 2018-03-28 ENCOUNTER — Encounter (HOSPITAL_COMMUNITY): Payer: Self-pay | Admitting: Radiology

## 2018-03-28 ENCOUNTER — Ambulatory Visit (HOSPITAL_COMMUNITY)
Admission: RE | Admit: 2018-03-28 | Discharge: 2018-03-28 | Disposition: A | Discharge status: Home / Self Care | Payer: Medicare Other | Source: Ambulatory Visit | Attending: Family Medicine | Admitting: Family Medicine

## 2018-03-28 DIAGNOSIS — Z1231 Encounter for screening mammogram for malignant neoplasm of breast: Secondary | ICD-10-CM | POA: Diagnosis not present

## 2018-03-31 ENCOUNTER — Other Ambulatory Visit: Payer: Self-pay | Admitting: Family Medicine

## 2018-03-31 DIAGNOSIS — R928 Other abnormal and inconclusive findings on diagnostic imaging of breast: Secondary | ICD-10-CM

## 2018-04-04 ENCOUNTER — Other Ambulatory Visit: Payer: Self-pay | Admitting: Family Medicine

## 2018-04-08 ENCOUNTER — Telehealth: Payer: Self-pay | Admitting: Family Medicine

## 2018-04-08 NOTE — Telephone Encounter (Signed)
Whitney Velez from [facility] contacted office to see if we had additional contact information for pt. Whitney Velez stated that pt needs diagnostic mammogram. Left message for pt to return call so we could inform her to call and schedule the diagnostic mammogram.  Also mailing a green card stating to give our office a call.

## 2018-04-14 ENCOUNTER — Telehealth: Payer: Self-pay | Admitting: *Deleted

## 2018-04-14 NOTE — Telephone Encounter (Signed)
[phone] - pt's number Pt needs to have diagnostic mammo scheduled [place].  Can go anytime.

## 2018-04-15 NOTE — Telephone Encounter (Signed)
Diagnostic mammo scheduled April 23rd register 11:15 for 11:20 appointment.

## 2018-04-15 NOTE — Telephone Encounter (Signed)
Pt notified of appointment

## 2018-04-15 NOTE — Telephone Encounter (Signed)
Left message to return call to let pt know when appt is scheduled.

## 2018-04-22 ENCOUNTER — Ambulatory Visit (HOSPITAL_COMMUNITY)
Admission: RE | Admit: 2018-04-22 | Discharge: 2018-04-22 | Disposition: A | Discharge status: Home / Self Care | Payer: Medicare Other | Source: Ambulatory Visit | Attending: Family Medicine | Admitting: Family Medicine

## 2018-04-22 ENCOUNTER — Other Ambulatory Visit: Payer: Self-pay | Admitting: Family Medicine

## 2018-04-22 ENCOUNTER — Encounter (HOSPITAL_COMMUNITY): Payer: Self-pay

## 2018-04-22 DIAGNOSIS — R921 Mammographic calcification found on diagnostic imaging of breast: Secondary | ICD-10-CM | POA: Diagnosis not present

## 2018-04-22 DIAGNOSIS — R928 Other abnormal and inconclusive findings on diagnostic imaging of breast: Secondary | ICD-10-CM

## 2018-04-22 DIAGNOSIS — N6311 Unspecified lump in the right breast, upper outer quadrant: Secondary | ICD-10-CM | POA: Diagnosis not present

## 2018-04-22 DIAGNOSIS — N6324 Unspecified lump in the left breast, lower inner quadrant: Secondary | ICD-10-CM | POA: Diagnosis not present

## 2018-04-22 DIAGNOSIS — N6312 Unspecified lump in the right breast, upper inner quadrant: Secondary | ICD-10-CM | POA: Diagnosis not present

## 2018-04-29 ENCOUNTER — Ambulatory Visit (HOSPITAL_COMMUNITY)
Admission: RE | Admit: 2018-04-29 | Discharge: 2018-04-29 | Disposition: A | Discharge status: Home / Self Care | Payer: Medicare Other | Source: Ambulatory Visit | Attending: Family Medicine | Admitting: Family Medicine

## 2018-04-29 ENCOUNTER — Other Ambulatory Visit: Payer: Self-pay | Admitting: Family Medicine

## 2018-04-29 ENCOUNTER — Encounter (HOSPITAL_COMMUNITY): Payer: Self-pay

## 2018-04-29 DIAGNOSIS — D0512 Intraductal carcinoma in situ of left breast: Secondary | ICD-10-CM | POA: Diagnosis not present

## 2018-04-29 DIAGNOSIS — C50411 Malignant neoplasm of upper-outer quadrant of right female breast: Secondary | ICD-10-CM | POA: Insufficient documentation

## 2018-04-29 DIAGNOSIS — C773 Secondary and unspecified malignant neoplasm of axilla and upper limb lymph nodes: Secondary | ICD-10-CM | POA: Insufficient documentation

## 2018-04-29 DIAGNOSIS — R928 Other abnormal and inconclusive findings on diagnostic imaging of breast: Secondary | ICD-10-CM

## 2018-04-29 DIAGNOSIS — C50211 Malignant neoplasm of upper-inner quadrant of right female breast: Secondary | ICD-10-CM | POA: Diagnosis not present

## 2018-04-29 DIAGNOSIS — C7989 Secondary malignant neoplasm of other specified sites: Secondary | ICD-10-CM | POA: Diagnosis not present

## 2018-04-29 DIAGNOSIS — C50811 Malignant neoplasm of overlapping sites of right female breast: Secondary | ICD-10-CM | POA: Diagnosis not present

## 2018-04-29 DIAGNOSIS — N6311 Unspecified lump in the right breast, upper outer quadrant: Secondary | ICD-10-CM | POA: Diagnosis not present

## 2018-04-29 DIAGNOSIS — N6321 Unspecified lump in the left breast, upper outer quadrant: Secondary | ICD-10-CM | POA: Diagnosis not present

## 2018-04-29 DIAGNOSIS — N6324 Unspecified lump in the left breast, lower inner quadrant: Secondary | ICD-10-CM | POA: Diagnosis not present

## 2018-04-29 DIAGNOSIS — N6323 Unspecified lump in the left breast, lower outer quadrant: Secondary | ICD-10-CM | POA: Diagnosis not present

## 2018-04-29 MED ORDER — LIDOCAINE HCL (PF) 1 % IJ SOLN
INTRAMUSCULAR | Status: AC
  Filled 2018-04-29: qty 15

## 2018-04-29 MED ORDER — LIDOCAINE-EPINEPHRINE (PF) 1 %-1:200000 IJ SOLN
INTRAMUSCULAR | Status: AC
  Administered 2018-04-29: 17 mL
  Filled 2018-04-29: qty 30

## 2018-04-30 ENCOUNTER — Telehealth: Payer: Self-pay | Admitting: *Deleted

## 2018-04-30 NOTE — Telephone Encounter (Signed)
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 The doctor that performed the biopsies will notify the patient of the results and they will proceed with the appropriate referral for the patient but wanted you to be aware.

## 2018-05-05 ENCOUNTER — Other Ambulatory Visit: Payer: Self-pay | Admitting: Family Medicine

## 2018-05-06 ENCOUNTER — Encounter: Payer: Self-pay | Admitting: General Surgery

## 2018-05-06 ENCOUNTER — Ambulatory Visit (INDEPENDENT_AMBULATORY_CARE_PROVIDER_SITE_OTHER): Payer: Medicare Other | Admitting: General Surgery

## 2018-05-06 VITALS — BP 156/74 | HR 79 | Temp 98.6°F | Resp 18 | Ht 60.0 in | Wt 178.0 lb

## 2018-05-06 DIAGNOSIS — C773 Secondary and unspecified malignant neoplasm of axilla and upper limb lymph nodes: Secondary | ICD-10-CM | POA: Diagnosis not present

## 2018-05-06 DIAGNOSIS — C50012 Malignant neoplasm of nipple and areola, left female breast: Secondary | ICD-10-CM

## 2018-05-06 DIAGNOSIS — Z170 Estrogen receptor positive status [ER+]: Secondary | ICD-10-CM

## 2018-05-06 DIAGNOSIS — C50911 Malignant neoplasm of unspecified site of right female breast: Secondary | ICD-10-CM | POA: Diagnosis not present

## 2018-05-06 NOTE — Patient Instructions (Signed)
Implanted Port Insertion  Implanted port insertion is a procedure to put in a port and catheter. The port is a device with an injectable disk that can be accessed by your health care provider. The port is connected to a vein in the chest or neck by a small flexible tube (catheter). There are different types of ports. The implanted port may be used as a long-term IV access for:  · Medicines, such as chemotherapy.  · Fluids.  · Liquid nutrition, such as total parenteral nutrition (TPN).  · Blood samples.    Having a port means that your health care provider will not need to use the veins in your arms for these procedures.  Tell a health care provider about:  · Any allergies you have.  · All medicines you are taking, especially blood thinners, as well as any vitamins, herbs, eye drops, creams, over-the-counter medicines, and steroids.  · Any problems you or family members have had with anesthetic medicines.  · Any blood disorders you have.  · Any surgeries you have had.  · Any medical conditions you have, including diabetes or kidney problems.  · Whether you are pregnant or may be pregnant.  What are the risks?  Generally, this is a safe procedure. However, problems may occur, including:  · Allergic reactions to medicines or dyes.  · Damage to other structures or organs.  · Infection.  · Damage to the blood vessel, bruising, or bleeding at the puncture site.  · Blood clot.  · Breakdown of the skin over the port.  · A collection of air in the chest that can cause one of the lungs to collapse (pneumothorax). This is rare.    What happens before the procedure?  Staying hydrated  Follow instructions from your health care provider about hydration, which may include:  · Up to 2 hours before the procedure - you may continue to drink clear liquids, such as water, clear fruit juice, black coffee, and plain tea.    Eating and drinking restrictions  · Follow instructions from your health care provider about eating and drinking,  which may include:  ? 8 hours before the procedure - stop eating heavy meals or foods such as meat, fried foods, or fatty foods.  ? 6 hours before the procedure - stop eating light meals or foods, such as toast or cereal.  ? 6 hours before the procedure - stop drinking milk or drinks that contain milk.  ? 2 hours before the procedure - stop drinking clear liquids.  Medicines  · Ask your health care provider about:  ? Changing or stopping your regular medicines. This is especially important if you are taking diabetes medicines or blood thinners.  ? Taking medicines such as aspirin and ibuprofen. These medicines can thin your blood. Do not take these medicines before your procedure if your health care provider instructs you not to.  · You may be given antibiotic medicine to help prevent infection.  General instructions  · Plan to have someone take you home from the hospital or clinic.  · If you will be going home right after the procedure, plan to have someone with you for 24 hours.  · You may have blood tests.  · You may be asked to shower with a germ-killing soap.  What happens during the procedure?  · To lower your risk of infection:  ? Your health care team will wash or sanitize their hands.  ? Your skin will be washed with   soap.  ? Hair may be removed from the surgical area.  · An IV tube will be inserted into one of your veins.  · You will be given one or more of the following:  ? A medicine to help you relax (sedative).  ? A medicine to numb the area (local anesthetic).  · Two small cuts (incisions) will be made to insert the port.  ? One incision will be made in your neck to get access to the vein where the catheter will lie.  ? The other incision will be made in the upper chest. This is where the port will lie.  · The procedure may be done using continuous X-ray (fluoroscopy) or other imaging tools for guidance.  · The port and catheter will be placed. There may be a small, raised area where the port  is.  · The port will be flushed with a salt solution (saline), and blood will be drawn to make sure that it is working correctly.  · The incisions will be closed.  · Bandages (dressings) may be placed over the incisions.  The procedure may vary among health care providers and hospitals.  What happens after the procedure?  · Your blood pressure, heart rate, breathing rate, and blood oxygen level will be monitored until the medicines you were given have worn off.  · Do not drive for 24 hours if you were given a sedative.  · You will be given a manufacturer's information card for the type of port that you have. Keep this with you.  · Your port will need to be flushed and checked as told by your health care provider, usually every few weeks.  · A chest X-ray will be done to:  ? Check the placement of the port.  ? Make sure there is no injury to your lung.  Summary  · Implanted port insertion is a procedure to put in a port and catheter.  · The implanted port is used as a long-term IV access.  · The port will need to be flushed and checked as told by your health care provider, usually every few weeks.  · Keep your manufacturer's information card with you at all times.  This information is not intended to replace advice given to you by your health care provider. Make sure you discuss any questions you have with your health care provider.  Document Released: 10/07/2013 Document Revised: 11/07/2016 Document Reviewed: 11/07/2016  Elsevier Interactive Patient Education © 2017 Elsevier Inc.

## 2018-05-06 NOTE — Progress Notes (Signed)
Whitney Velez; [id number]; 1950/12/31   HPI Patient is a 68 year old white female who was referred to my care by [facility] radiology, Dr. Shelly Bombard, for evaluation treatment of bilateral breast cancer.  This was found on screening mammography.  Patient has had biopsies at multiple sites in both the right and left breast.  Patient denies any family history of cancer.  She states she has had lumps in both breasts for some time, but was avoiding undergoing mammography.  She denies any nipple discharge. History reviewed. No pertinent past medical history.  History reviewed. No pertinent surgical history.  History reviewed. No pertinent family history.  Current Outpatient Medications on File Prior to Visit  Medication Sig Dispense Refill  . albuterol (PROAIR HFA) 108 (90 Base) MCG/ACT inhaler INHALE TWO PUFFS INTO THE LUNGS EVERY SIX HOURS AS NEEDED FOR WHEEZING 8.5 g 5  . albuterol (PROVENTIL) (2.5 MG/3ML) 0.083% nebulizer solution Use via neb q 4 hrs prn wheezing 25 vial 2  . amLODipine (NORVASC) 10 MG tablet Take 1 tablet (10 mg total) by mouth daily. 30 tablet 4  . aspirin 81 MG tablet Take 81 mg by mouth daily.    . Cholecalciferol (VITAMIN D-3) 1000 UNITS CAPS Take by mouth daily.    . Flaxseed, Linseed, (FLAXSEED OIL) 1000 MG CAPS Take by mouth daily.    . hydrochlorothiazide (HYDRODIURIL) 25 MG tablet Take 1 tablet (25 mg total) by mouth daily. 30 tablet 5  . metFORMIN (GLUCOPHAGE) 500 MG tablet Take 1 tablet (500 mg total) by mouth daily with breakfast. 30 tablet 5  . montelukast (SINGULAIR) 10 MG tablet TAKE ONE TABLET BY MOUTH EVERY NIGHT AT BEDTIME 30 tablet 5  . nabumetone (RELAFEN) 750 MG tablet Take 1 tablet (750 mg total) by mouth 2 (two) times daily. With food 28 tablet 5   Current Facility-Administered Medications on File Prior to Visit  Medication Dose Route Frequency Provider Last Rate Last Dose  . methylPREDNISolone acetate (DEPO-MEDROL) injection 40 mg  40 mg Intra-articular  Once Mikey Kirschner, MD      . methylPREDNISolone acetate (DEPO-MEDROL) injection 40 mg  40 mg Intra-articular Once Mikey Kirschner, MD        Allergies  Allergen Reactions  . Penicillins     Nausea, vomiting, dizziness  . Percocet [Oxycodone-Acetaminophen]     Nausea, vomiting, diarrhea  . Ranitidine Itching    Social History   Substance and Sexual Activity  Alcohol Use Not on file    Social History   Tobacco Use  Smoking Status Never Smoker  Smokeless Tobacco Never Used    Review of Systems  Constitutional: Negative.   HENT: Negative.   Eyes: Negative.   Respiratory: Negative.   Cardiovascular: Negative.   Genitourinary: Negative.   Musculoskeletal: Negative.   Skin: Negative.   Neurological: Negative.   Endo/Heme/Allergies: Negative.   Psychiatric/Behavioral: Negative.     Objective   Vitals:   05/06/18 1336  BP: (!) 156/74  Pulse: 79  Resp: 18  Temp: 98.6 F (37 C)    Physical Exam  Constitutional: She is oriented to person, place, and time. She appears well-developed and well-nourished.  HENT:  Head: Normocephalic and atraumatic.  Neck: Normal range of motion. Neck supple.  Cardiovascular: Normal rate and regular rhythm. Exam reveals no gallop and no friction rub.  Murmur heard. 2 out of 6 systolic ejection murmur.  Pulmonary/Chest: Effort normal and breath sounds normal. No stridor. No respiratory distress. She has no wheezes. She has no  rales.  Lymphadenopathy:    She has no cervical adenopathy.  Neurological: She is alert and oriented to person, place, and time.  Vitals reviewed. Breast: Dominant deep large mass which appears fixated to the chest wall with indentation inferiorly at the 6 to 7 o'clock position.  Dimpling noted in this region.  Right axilla with palpable lymph node.  Left breast with increased density noted centrally extending towards the chest wall.  Left axilla without palpable lymph nodes. Final pathology: Invasive ductal  carcinoma of the right breast, positive lymph node.  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  At this point, would like patient evaluated by oncology for neoadjuvant therapy prior to any surgical intervention.  She will need a right modified radical mastectomy with left simple mastectomy.  I did tell her about the possible need for Port-A-Cath.  The information was given.  Further management is pending evaluation by oncology.

## 2018-05-12 ENCOUNTER — Inpatient Hospital Stay (HOSPITAL_COMMUNITY): Payer: Medicare Other | Attending: Hematology | Admitting: Hematology

## 2018-05-12 ENCOUNTER — Inpatient Hospital Stay (HOSPITAL_COMMUNITY): Payer: Medicare Other | Attending: Hematology

## 2018-05-12 ENCOUNTER — Encounter (HOSPITAL_COMMUNITY): Payer: Self-pay | Admitting: Hematology

## 2018-05-12 DIAGNOSIS — C50811 Malignant neoplasm of overlapping sites of right female breast: Secondary | ICD-10-CM

## 2018-05-12 DIAGNOSIS — Z170 Estrogen receptor positive status [ER+]: Principal | ICD-10-CM

## 2018-05-12 DIAGNOSIS — D0512 Intraductal carcinoma in situ of left breast: Secondary | ICD-10-CM | POA: Insufficient documentation

## 2018-05-12 DIAGNOSIS — C773 Secondary and unspecified malignant neoplasm of axilla and upper limb lymph nodes: Secondary | ICD-10-CM | POA: Diagnosis not present

## 2018-05-12 DIAGNOSIS — C50911 Malignant neoplasm of unspecified site of right female breast: Secondary | ICD-10-CM | POA: Insufficient documentation

## 2018-05-12 LAB — COMPREHENSIVE METABOLIC PANEL
ALBUMIN: 4.4 g/dL (ref 3.5–5.0)
ALK PHOS: 79 U/L (ref 38–126)
ALT: 23 U/L (ref 14–54)
AST: 29 U/L (ref 15–41)
Anion gap: 13 (ref 5–15)
BUN: 19 mg/dL (ref 6–20)
CALCIUM: 9.9 mg/dL (ref 8.9–10.3)
CO2: 28 mmol/L (ref 22–32)
CREATININE: 1 mg/dL (ref 0.44–1.00)
Chloride: 95 mmol/L — ABNORMAL LOW (ref 101–111)
GFR calc non Af Amer: 56 mL/min — ABNORMAL LOW (ref >60)
GLUCOSE: 102 mg/dL — AB (ref 65–99)
Potassium: 3 mmol/L — ABNORMAL LOW (ref 3.5–5.1)
SODIUM: 136 mmol/L (ref 135–145)
Total Bilirubin: 0.4 mg/dL (ref 0.3–1.2)
Total Protein: 9 g/dL — ABNORMAL HIGH (ref 6.5–8.1)

## 2018-05-12 LAB — CBC WITH DIFFERENTIAL/PLATELET
BASOS ABS: 0 10*3/uL (ref 0.0–0.1)
Basophils Relative: 0 %
EOS ABS: 0.7 10*3/uL (ref 0.0–0.7)
EOS PCT: 5 %
HCT: 38.6 % (ref 36.0–46.0)
Hemoglobin: 12.9 g/dL (ref 12.0–15.0)
LYMPHS ABS: 3 10*3/uL (ref 0.7–4.0)
Lymphocytes Relative: 22 %
MCH: 28.2 pg (ref 26.0–34.0)
MCHC: 33.4 g/dL (ref 30.0–36.0)
MCV: 84.5 fL (ref 78.0–100.0)
Monocytes Absolute: 1.3 10*3/uL (ref 0.1–1.0)
Monocytes Relative: 10 %
Neutro Abs: 8.5 10*3/uL (ref 1.7–7.7)
Neutrophils Relative %: 63 %
PLATELETS: 283 10*3/uL (ref 150–400)
RBC: 4.57 MIL/uL (ref 3.87–5.11)
RDW: 14.5 % (ref 11.5–15.5)
WBC: 13.6 10*3/uL — ABNORMAL HIGH (ref 4.0–10.5)

## 2018-05-12 NOTE — Patient Instructions (Signed)
[place] at [facility]  Discharge Instructions:  You were seen by Dr. Delton Coombes today.  _______________________________________________________________  Thank you for choosing [place] at [facility] to provide your oncology and hematology care.  To afford each patient quality time with our providers, please arrive at least 15 minutes before your scheduled appointment.  You need to re-schedule your appointment if you arrive 10 or more minutes late.  We strive to give you quality time with our providers, and arriving late affects you and other patients whose appointments are after yours.  Also, if you no show three or more times for appointments you may be dismissed from the clinic.  Again, thank you for choosing [place] [facility] [place] hope is that these requests will allow you access to exceptional care and in a timely manner. _______________________________________________________________  If you have questions after your visit, please contact our office at [phone] between the hours of 8:30 a.m. and 5:00 p.m. Voicemails left after 4:30 p.m. will not be returned until the following business day. _______________________________________________________________  For prescription refill requests, have your pharmacy contact our office. _______________________________________________________________  Recommendations made by the consultant and any test results will be sent to your referring physician. _______________________________________________________________

## 2018-05-12 NOTE — Assessment & Plan Note (Addendum)
1.  Locally advanced right breast cancer: - Mammogram on 03/28/2018 was BI-RADS Category 0.  Mammogram and ultrasound on 04/22/2018 shows 5.9 x 5 x 6 cm mass extending in the upper quadrant of the right breast and right axillary lymph node measuring 2.8 x 1.6 x 1.7 cm.  Left breast has retroareolar mass.  Patient did not have mammogram in the last 20 years. - Biopsy on 04/29/2018 of the right breast mass consistent with IDC, grade 2-3, lymph node biopsy positive for metastatic disease, ER/PR positive, HER-2 negative, Ki-67 of 5% -Seen by Dr. Jenkins who recommended right modified radical mastectomy and left simple mastectomy. - Right breast mass extends the whole of upper quadrant and feels slightly attached to the chest wall.  I have recommended doing a CT scan of the chest, abdomen and pelvis to evaluate for metastatic disease and if the tumor is infiltrating the pectoralis.  As her previous labs showed high calcium, I have recommended obtaining a bone scan also.  We will also obtain an intact PTH level.  Further recommendations will depend on the results of the scans.  2.  Left breast DCIS: -Biopsy on 04/29/2018 of the left breast shows DCIS, ER/PR positive.  Left axillary lymph node biopsy was negative for malignancy. 

## 2018-05-12 NOTE — Progress Notes (Signed)
[facility] CONSULT NOTE  Patient Care Team: Mikey Kirschner, MD as PCP - General (Family Medicine)  CHIEF COMPLAINTS/PURPOSE OF CONSULTATION:  Newly diagnosed breast cancer  HISTORY OF PRESENTING ILLNESS:  Whitney Velez 68 y.o. female is here because of recent diagnosis of right breast cancer.  She had a mammogram done on March 28, 2018 after 20 years of not having one.  This was abnormal.  Repeat mammogram on 04/22/2018 with ultrasound showed right breast mass with right axillary lymph node.  This was biopsied on 04/29/2018 along with biopsy of the left breast mass.  Right breast mass results came back as breast cancer.  Left breast has DCIS.  She has seen Dr. Arnoldo Morale, who in turn sent her here to see if she benefits from neoadjuvant chemotherapy.  He was recommending right modified radical mastectomy and left simple mastectomy.  She denies any new onset pains.  Denies any prior breast biopsies.  Denies any family history of breast or other malignancies.  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  I reviewed her records extensively and collaborated the history with the patient.   In terms of breast cancer risk profile:  She menarched at early age of 45 and went to menopause at age 40 when she underwent hysterectomy and oophorectomy for bleeding. She had 2 pregnancies, her first child was born at age 35 She has received birth control pills for approximately 1 year.  She was never exposed to fertility medications or hormone replacement therapy.  She has no family history of Breast/GYN/GI cancer  MEDICAL HISTORY:  Past Medical History:  Diagnosis Date  . Asthma   . Hypertension     SURGICAL HISTORY: History reviewed. No pertinent surgical history.  SOCIAL HISTORY: Social History   Socioeconomic History  . Marital status: Widowed    Spouse name: Not on file  . Number of children: Not on file  . Years of education: Not on file  . Highest education level: Not on  file  Occupational History  . Not on file  Social Needs  . Financial resource strain: Not on file  . Food insecurity:    Worry: Not on file    Inability: Not on file  . Transportation needs:    Medical: Not on file    Non-medical: Not on file  Tobacco Use  . Smoking status: Never Smoker  . Smokeless tobacco: Never Used  Substance and Sexual Activity  . Alcohol use: Not on file  . Drug use: Not on file  . Sexual activity: Not on file  Lifestyle  . Physical activity:    Days per week: Not on file    Minutes per session: Not on file  . Stress: Not on file  Relationships  . Social connections:    Talks on phone: Not on file    Gets together: Not on file    Attends religious service: Not on file    Active member of club or organization: Not on file    Attends meetings of clubs or organizations: Not on file    Relationship status: Not on file  . Intimate partner violence:    Fear of current or ex partner: Not on file    Emotionally abused: Not on file    Physically abused: Not on file    Forced sexual activity: Not on file  Other Topics Concern  . Not on file  Social History Narrative  . Not on file    FAMILY  HISTORY: History reviewed. No pertinent family history.  ALLERGIES:  is allergic to penicillins; percocet [oxycodone-acetaminophen]; and ranitidine.  MEDICATIONS:  Current Outpatient Medications  Medication Sig Dispense Refill  . albuterol (PROAIR HFA) 108 (90 Base) MCG/ACT inhaler INHALE TWO PUFFS INTO THE LUNGS EVERY SIX HOURS AS NEEDED FOR WHEEZING 8.5 g 5  . albuterol (PROVENTIL) (2.5 MG/3ML) 0.083% nebulizer solution Use via neb q 4 hrs prn wheezing 25 vial 2  . amLODipine (NORVASC) 10 MG tablet Take 1 tablet (10 mg total) by mouth daily. 30 tablet 4  . aspirin 81 MG tablet Take 81 mg by mouth daily.    . Cholecalciferol (VITAMIN D-3) 1000 UNITS CAPS Take by mouth daily.    . Flaxseed, Linseed, (FLAXSEED OIL) 1000 MG CAPS Take by mouth daily.    .  hydrochlorothiazide (HYDRODIURIL) 25 MG tablet Take 1 tablet (25 mg total) by mouth daily. 30 tablet 5  . metFORMIN (GLUCOPHAGE) 500 MG tablet Take 1 tablet (500 mg total) by mouth daily with breakfast. 30 tablet 5  . montelukast (SINGULAIR) 10 MG tablet TAKE ONE TABLET BY MOUTH EVERY NIGHT AT BEDTIME 30 tablet 5  . nabumetone (RELAFEN) 750 MG tablet Take 1 tablet (750 mg total) by mouth 2 (two) times daily. With food 28 tablet 5   Current Facility-Administered Medications  Medication Dose Route Frequency Provider Last Rate Last Dose  . methylPREDNISolone acetate (DEPO-MEDROL) injection 40 mg  40 mg Intra-articular Once Mikey Kirschner, MD      . methylPREDNISolone acetate (DEPO-MEDROL) injection 40 mg  40 mg Intra-articular Once Mikey Kirschner, MD        REVIEW OF SYSTEMS:   Constitutional: Denies fevers, chills or abnormal night sweats Eyes: Denies blurriness of vision, double vision or watery eyes Ears, nose, mouth, throat, and face: Denies mucositis or sore throat Respiratory: Denies cough, dyspnea or wheezes Cardiovascular: Denies palpitation, chest discomfort or lower extremity swelling Gastrointestinal:  Denies nausea, heartburn or change in bowel habits Skin: Denies abnormal skin rashes Lymphatics: Denies new lymphadenopathy or easy bruising Neurological:Denies numbness, tingling or new weaknesses Behavioral/Psych: Mood is stable, no new changes  Breast: Denies any breast pain.   All other systems were reviewed with the patient and are negative.  PHYSICAL EXAMINATION: ECOG PERFORMANCE STATUS: 0 - Asymptomatic  Vitals:   05/12/18 1423  BP: (!) 159/77  Pulse: 78  Resp: 18  Temp: 98.3 F (36.8 C)  SpO2: 97%   Filed Weights   05/12/18 1423  Weight: 176 lb 1.6 oz (79.9 kg)    GENERAL:alert, no distress and comfortable SKIN: skin color, texture, turgor are normal, no rashes or significant lesions EYES: normal, conjunctiva are pink and non-injected, sclera  clear OROPHARYNX:no exudate, no erythema and lips, buccal mucosa, and tongue normal  NECK: supple, thyroid normal size, non-tender, without nodularity LYMPH:  no palpable lymphadenopathy in the cervical, axillary or inguinal LUNGS: clear to auscultation and percussion with normal breathing effort HEART: regular rate & rhythm and no murmurs and no lower extremity edema ABDOMEN:abdomen soft, non-tender and normal bowel sounds Musculoskeletal:no cyanosis of digits and no clubbing  PSYCH: alert & oriented x 3 with fluent speech NEURO: no focal motor/sensory deficits BREAST: Right breast has palpable mass measuring 6 cm in the whole of her upper quadrant.  It is not freely mobile over the chest wall.  Right axillary lymph node palpable.  Right nipple is retracted.  Left breast has a mass in the center.  No adenopathy.  LABORATORY DATA:  I have reviewed the data as listed Lab Results  Component Value Date   WBC 13.6 (H) 05/12/2018   HGB 12.9 05/12/2018   HCT 38.6 05/12/2018   MCV 84.5 05/12/2018   PLT 283 05/12/2018   Lab Results  Component Value Date   NA 136 05/12/2018   K 3.0 (L) 05/12/2018   CL 95 (L) 05/12/2018   CO2 28 05/12/2018    RADIOGRAPHIC STUDIES: I have personally reviewed the radiological reports and agreed with the findings in the report.  ASSESSMENT AND PLAN:  Breast cancer, right ([place]) 1.  Locally advanced right breast cancer: - Mammogram on 03/28/2018 was BI-RADS Category 0.  Mammogram and ultrasound on 04/22/2018 shows 5.9 x 5 x 6 cm mass extending in the upper quadrant of the right breast and right axillary lymph node measuring 2.8 x 1.6 x 1.7 cm.  Left breast has retroareolar mass.  Patient did not have mammogram in the last 20 years. - Biopsy on 04/29/2018 of the right breast mass consistent with IDC, grade 2-3, lymph node biopsy positive for metastatic disease, ER/PR positive, HER-2 negative, Ki-67 of 5% -Seen by Dr. Arnoldo Morale who recommended right modified radical  mastectomy and left simple mastectomy. - Right breast mass extends the whole of upper quadrant and feels slightly attached to the chest wall.  I have recommended doing a CT scan of the chest, abdomen and pelvis to evaluate for metastatic disease and if the tumor is infiltrating the pectoralis.  As her previous labs showed high calcium, I have recommended obtaining a bone scan also.  We will also obtain an intact PTH level.  Further recommendations will depend on the results of the scans.  2.  Left breast DCIS: -Biopsy on 04/29/2018 of the left breast shows DCIS, ER/PR positive.  Left axillary lymph node biopsy was negative for malignancy.   All questions were answered. The patient knows to call the clinic with any problems, questions or concerns.    Derek Jack, MD 05/12/18

## 2018-05-13 LAB — PTH, INTACT AND CALCIUM
Calcium, Total (PTH): 9.9 mg/dL (ref 8.7–10.3)
PTH: 35 pg/mL (ref 15–65)

## 2018-05-16 ENCOUNTER — Encounter (HOSPITAL_COMMUNITY)
Admission: RE | Admit: 2018-05-16 | Discharge: 2018-05-16 | Disposition: A | Discharge status: Home / Self Care | Payer: Medicare Other | Source: Ambulatory Visit | Attending: Hematology | Admitting: Hematology

## 2018-05-16 DIAGNOSIS — C50911 Malignant neoplasm of unspecified site of right female breast: Secondary | ICD-10-CM | POA: Diagnosis not present

## 2018-05-16 DIAGNOSIS — Z170 Estrogen receptor positive status [ER+]: Secondary | ICD-10-CM | POA: Insufficient documentation

## 2018-05-16 DIAGNOSIS — C50811 Malignant neoplasm of overlapping sites of right female breast: Secondary | ICD-10-CM | POA: Diagnosis not present

## 2018-05-16 MED ORDER — TECHNETIUM TC 99M MEDRONATE IV KIT
21.1000 | PACK | Freq: Once | INTRAVENOUS | Status: AC | PRN
  Administered 2018-05-16: 21.1 via INTRAVENOUS

## 2018-05-27 ENCOUNTER — Ambulatory Visit (HOSPITAL_COMMUNITY)
Admission: RE | Admit: 2018-05-27 | Discharge: 2018-05-27 | Disposition: A | Discharge status: Home / Self Care | Payer: Medicare Other | Source: Ambulatory Visit | Attending: Hematology | Admitting: Hematology

## 2018-05-27 DIAGNOSIS — C50911 Malignant neoplasm of unspecified site of right female breast: Secondary | ICD-10-CM | POA: Diagnosis not present

## 2018-05-27 DIAGNOSIS — R918 Other nonspecific abnormal finding of lung field: Secondary | ICD-10-CM | POA: Insufficient documentation

## 2018-05-27 DIAGNOSIS — C50912 Malignant neoplasm of unspecified site of left female breast: Secondary | ICD-10-CM | POA: Diagnosis not present

## 2018-05-27 DIAGNOSIS — I2510 Atherosclerotic heart disease of native coronary artery without angina pectoris: Secondary | ICD-10-CM | POA: Diagnosis not present

## 2018-05-27 DIAGNOSIS — Z170 Estrogen receptor positive status [ER+]: Secondary | ICD-10-CM | POA: Insufficient documentation

## 2018-05-27 DIAGNOSIS — C773 Secondary and unspecified malignant neoplasm of axilla and upper limb lymph nodes: Secondary | ICD-10-CM | POA: Insufficient documentation

## 2018-05-27 DIAGNOSIS — K5730 Diverticulosis of large intestine without perforation or abscess without bleeding: Secondary | ICD-10-CM | POA: Diagnosis not present

## 2018-05-27 DIAGNOSIS — I700 Atherosclerosis of aorta: Secondary | ICD-10-CM | POA: Diagnosis not present

## 2018-05-27 DIAGNOSIS — C50811 Malignant neoplasm of overlapping sites of right female breast: Secondary | ICD-10-CM | POA: Insufficient documentation

## 2018-05-27 MED ORDER — IOPAMIDOL (ISOVUE-300) INJECTION 61%
100.0000 mL | Freq: Once | INTRAVENOUS | Status: AC | PRN
  Administered 2018-05-27: 100 mL via INTRAVENOUS

## 2018-05-29 ENCOUNTER — Encounter (HOSPITAL_COMMUNITY): Payer: Self-pay | Admitting: Hematology

## 2018-05-29 ENCOUNTER — Other Ambulatory Visit: Payer: Self-pay

## 2018-05-29 ENCOUNTER — Inpatient Hospital Stay (HOSPITAL_BASED_OUTPATIENT_CLINIC_OR_DEPARTMENT_OTHER): Payer: Medicare Other | Admitting: Hematology

## 2018-05-29 VITALS — BP 155/61 | HR 67 | Temp 98.8°F | Resp 18 | Wt 173.8 lb

## 2018-05-29 DIAGNOSIS — C50811 Malignant neoplasm of overlapping sites of right female breast: Secondary | ICD-10-CM

## 2018-05-29 DIAGNOSIS — D0512 Intraductal carcinoma in situ of left breast: Secondary | ICD-10-CM

## 2018-05-29 DIAGNOSIS — Z170 Estrogen receptor positive status [ER+]: Secondary | ICD-10-CM

## 2018-05-29 DIAGNOSIS — C773 Secondary and unspecified malignant neoplasm of axilla and upper limb lymph nodes: Secondary | ICD-10-CM | POA: Diagnosis not present

## 2018-05-29 DIAGNOSIS — Z7189 Other specified counseling: Secondary | ICD-10-CM | POA: Insufficient documentation

## 2018-05-29 NOTE — Patient Instructions (Signed)
[facility] at [facility] Discharge Instructions  Today you saw Dr. K.   Thank you for choosing [facility] at [facility] to provide your oncology and hematology care.  To afford each patient quality time with our provider, please arrive at least 15 minutes before your scheduled appointment time.   If you have a lab appointment with the Cancer Center please come in thru the  Main Entrance and check in at the main information desk  You need to re-schedule your appointment should you arrive 10 or more minutes late.  We strive to give you quality time with our providers, and arriving late affects you and other patients whose appointments are after yours.  Also, if you no show three or more times for appointments you may be dismissed from the clinic at the providers discretion.     Again, thank you for choosing [facility].  Our hope is that these requests will decrease the amount of time that you wait before being seen by our physicians.       _____________________________________________________________  Should you have questions after your visit to [facility], please contact our office at [phone] between the hours of 8:30 a.m. and 4:30 p.m.  Voicemails left after 4:30 p.m. will not be returned until the following business day.  For prescription refill requests, have your pharmacy contact our office.       Resources For Cancer Patients and their Caregivers ? American Cancer Society: Can assist with transportation, wigs, general needs, runs Look Good Feel Better.        1-888-227-6333 ? Cancer Care: Provides financial assistance, online support groups, medication/co-pay assistance.  1-800-813-HOPE (4673) ? [facility] Assists [place] cancer patients and their families through emotional , educational and financial support.  336-427-4357 ? [place] DSS Where to apply for food  stamps, Medicaid and utility assistance. 336-342-1394 ? [facility]: Transportation to medical appointments. 336-347-2287 ? Social Security Administration: May apply for disability if have a Stage IV cancer. 336-342-7796 1-800-772-1213 ? [place] Aging, Disability and Transit Services: Assists with nutrition, care and transit needs. 336-349-2343  Cancer Center Support Programs:   > Cancer Support Group  2nd Tuesday of the month 1pm-2pm, Journey Room   > Creative Journey  3rd Tuesday of the month 1130am-1pm, Journey Room    

## 2018-05-29 NOTE — Progress Notes (Signed)
START ON PATHWAY REGIMEN - Breast   Doxorubicin + Cyclophosphamide (AC):   A cycle is every 21 days:     Doxorubicin      Cyclophosphamide   **Always confirm dose/schedule in your pharmacy ordering system**    Paclitaxel 80 mg/m2 Weekly:   Administer weekly:     Paclitaxel   **Always confirm dose/schedule in your pharmacy ordering system**    Patient Characteristics: Preoperative or Nonsurgical Candidate (Clinical Staging), Neoadjuvant Therapy followed by Surgery, Invasive Disease, Chemotherapy, HER2 Negative/Unknown/Equivocal, ER Positive Therapeutic Status: Preoperative or Nonsurgical Candidate (Clinical Staging) AJCC M Category: cM0 AJCC Grade: GX Breast Surgical Plan: Neoadjuvant Therapy followed by Surgery ER Status: Positive (+) AJCC 8 Stage Grouping: IIIA HER2 Status: Negative (-) AJCC T Category: cTX AJCC N Category: cNX PR Status: Positive (+) Intent of Therapy: Curative Intent, Discussed with Patient

## 2018-05-29 NOTE — Assessment & Plan Note (Signed)
1.  Locally advanced (stage IIIa, T3N1) right breast cancer: - Mammogram on 03/28/2018 was BI-RADS Category 0.  Mammogram and ultrasound on 04/22/2018 shows 5.9 x 5 x 6 cm mass extending in the upper quadrant of the right breast and right axillary lymph node measuring 2.8 x 1.6 x 1.7 cm.  Left breast has retroareolar mass.  Patient did not have mammogram in the last 20 years. - Biopsy on 04/29/2018 of the right breast mass consistent with IDC, grade 2-3, lymph node biopsy positive for metastatic disease, ER/PR positive, HER-2 negative, Ki-67 of 5% -Seen by Dr. Arnoldo Morale who recommended right modified radical mastectomy and left simple mastectomy. - I have discussed the results of the bone scan which showed increased uptake in the right greater trochanter and right sacroiliac joint.  I have also discussed the results of the CT scan of the chest abdomen and pelvis which did not show any evidence of metastatic disease.  The uptake in the right greater trochanter was thought to be due to enthesopathic change.  Uptake in the right sacroiliac joint was likely from degenerative changes at the right L5-S1 joint. - Due to the locally advanced nature of her disease, I have recommended neoadjuvant chemotherapy.  We will use dose dense AC every 2 weeks for 4 cycles followed by weekly paclitaxel for 12 weeks.  I have discussed the side effects in detail.  We will arrange for chemo education.  She will need a port placement for chemotherapy.  We will ask Dr. Arnoldo Morale.  I will obtain a baseline echocardiogram to evaluate ejection fraction.  Questions were encouraged and answered to her satisfaction.  2.  Left breast DCIS: -Biopsy on 04/29/2018 of the left breast shows DCIS, ER/PR positive.  Left axillary lymph node biopsy was negative for malignancy.

## 2018-05-29 NOTE — Progress Notes (Signed)
[facility] [place], [place] [address]   CLINIC:  Medical Oncology/Hematology  PCP:  Whitney Velez, [place] [address] [phone]   REASON FOR VISIT:  Follow-up for locally advanced right-sided breast cancer.  CURRENT THERAPY: Neoadjuvant chemotherapy to be started.    INTERVAL HISTORY:  Whitney Velez 68 y.o. female returns for follow-up of scan results.  She was diagnosed with locally advanced right breast cancer after biopsy on 04/29/2018.  I have ordered a bone scan and CT scan at last visit.  She denies any new onset pains.  She denies any symptoms of PND or orthopnea.  She is continuing to work full-time job.  She is accompanied by her daughter and son today.  REVIEW OF SYSTEMS:  Review of Systems  Constitutional: Negative.   HENT:  Negative.   Respiratory: Negative.   Cardiovascular: Negative.   Gastrointestinal: Negative.   Genitourinary: Negative.    Musculoskeletal: Negative.   Skin: Negative.   Neurological: Negative.   Hematological: Negative.   Psychiatric/Behavioral: Negative.      PAST MEDICAL/SURGICAL HISTORY:  Past Medical History:  Diagnosis Date  . Asthma   . Hypertension    History reviewed. No pertinent surgical history.   SOCIAL HISTORY:  Social History   Socioeconomic History  . Marital status: Widowed    Spouse name: Not on file  . Number of children: Not on file  . Years of education: Not on file  . Highest education level: Not on file  Occupational History  . Not on file  Social Needs  . Financial resource strain: Not on file  . Food insecurity:    Worry: Not on file    Inability: Not on file  . Transportation needs:    Medical: Not on file    Non-medical: Not on file  Tobacco Use  . Smoking status: Never Smoker  . Smokeless tobacco: Never Used  Substance and Sexual Activity  . Alcohol use: Not on file  . Drug use: Not on file  . Sexual activity: Not on file    Lifestyle  . Physical activity:    Days per week: Not on file    Minutes per session: Not on file  . Stress: Not on file  Relationships  . Social connections:    Talks on phone: Not on file    Gets together: Not on file    Attends religious service: Not on file    Active member of club or organization: Not on file    Attends meetings of clubs or organizations: Not on file    Relationship status: Not on file  . Intimate partner violence:    Fear of current or ex partner: Not on file    Emotionally abused: Not on file    Physically abused: Not on file    Forced sexual activity: Not on file  Other Topics Concern  . Not on file  Social History Narrative  . Not on file    FAMILY HISTORY:  History reviewed. No pertinent family history.  CURRENT MEDICATIONS:  Outpatient Encounter Medications as of 05/29/2018  Medication Sig  . albuterol (PROAIR HFA) 108 (90 Base) MCG/ACT inhaler INHALE TWO PUFFS INTO THE LUNGS EVERY SIX HOURS AS NEEDED FOR WHEEZING  . albuterol (PROVENTIL) (2.5 MG/3ML) 0.083% nebulizer solution Use via neb q 4 hrs prn wheezing  . amLODipine (NORVASC) 10 MG tablet Take 1 tablet (10 mg total) by mouth daily.  Marland Kitchen  aspirin 81 MG tablet Take 81 mg by mouth daily.  . Cholecalciferol (VITAMIN D-3) 1000 UNITS CAPS Take by mouth daily.  . Flaxseed, Linseed, (FLAXSEED OIL) 1000 MG CAPS Take by mouth daily.  . hydrochlorothiazide (HYDRODIURIL) 25 MG tablet Take 1 tablet (25 mg total) by mouth daily.  . metFORMIN (GLUCOPHAGE) 500 MG tablet Take 1 tablet (500 mg total) by mouth daily with breakfast.  . montelukast (SINGULAIR) 10 MG tablet TAKE ONE TABLET BY MOUTH EVERY NIGHT AT BEDTIME  . nabumetone (RELAFEN) 750 MG tablet Take 1 tablet (750 mg total) by mouth 2 (two) times daily. With food   Facility-Administered Encounter Medications as of 05/29/2018  Medication  . methylPREDNISolone acetate (DEPO-MEDROL) injection 40 mg  . methylPREDNISolone acetate (DEPO-MEDROL) injection  40 mg    ALLERGIES:  Allergies  Allergen Reactions  . Penicillins     Nausea, vomiting, dizziness  . Percocet [Oxycodone-Acetaminophen]     Nausea, vomiting, diarrhea  . Ranitidine Itching     PHYSICAL EXAM:  ECOG Performance status: 0  Vitals:   05/29/18 1023  BP: (!) 155/61  Pulse: 67  Resp: 18  Temp: 98.8 F (37.1 C)  SpO2: 97%   Filed Weights   05/29/18 1023  Weight: 173 lb 12.8 oz (78.8 kg)    Physical Exam Deferred.  LABORATORY DATA:  I have reviewed the labs as listed.  CBC    Component Value Date/Time   WBC 13.6 (H) 05/12/2018 1601   RBC 4.57 05/12/2018 1601   HGB 12.9 05/12/2018 1601   HGB 12.4 02/07/2017 1109   HCT 38.6 05/12/2018 1601   HCT 38.2 02/07/2017 1109   PLT 283 05/12/2018 1601   PLT 335 02/07/2017 1109   MCV 84.5 05/12/2018 1601   MCV 86 02/07/2017 1109   MCH 28.2 05/12/2018 1601   MCHC 33.4 05/12/2018 1601   RDW 14.5 05/12/2018 1601   RDW 15.1 02/07/2017 1109   LYMPHSABS 3.0 05/12/2018 1601   LYMPHSABS 3.0 02/07/2017 1109   MONOABS 1.3 05/12/2018 1601   EOSABS 0.7 05/12/2018 1601   EOSABS 1.1 (H) 02/07/2017 1109   BASOSABS 0.0 05/12/2018 1601   BASOSABS 0.0 02/07/2017 1109   CMP Latest Ref Rng & Units 05/12/2018 05/12/2018 11/11/2017  Glucose 65 - 99 mg/dL 102(H) - 98  BUN 6 - 20 mg/dL 19 - 17  Creatinine 0.44 - 1.00 mg/dL 1.00 - 1.22(H)  Sodium 135 - 145 mmol/L 136 - 138  Potassium 3.5 - 5.1 mmol/L 3.0(L) - 3.8  Chloride 101 - 111 mmol/L 95(L) - 97  CO2 22 - 32 mmol/L 28 - 23  Calcium 8.9 - 10.3 mg/dL 9.9 9.9 10.5(H)  Total Protein 6.5 - 8.1 g/dL 9.0(H) - -  Total Bilirubin 0.3 - 1.2 mg/dL 0.4 - -  Alkaline Phos 38 - 126 U/L 79 - -  AST 15 - 41 U/L 29 - -  ALT 14 - 54 U/L 23 - -       DIAGNOSTIC IMAGING:  I have independently reviewed the images of the bone scan and CT scan of the chest, abdomen and pelvis and agree with the report.     ASSESSMENT & PLAN:   Breast cancer, right ([place]) 1.  Locally advanced  (stage IIIa, T3N1) right breast cancer: - Mammogram on 03/28/2018 was BI-RADS Category 0.  Mammogram and ultrasound on 04/22/2018 shows 5.9 x 5 x 6 cm mass extending in the upper quadrant of the right breast and right axillary lymph node measuring 2.8 x 1.6  x 1.7 cm.  Left breast has retroareolar mass.  Patient did not have mammogram in the last 20 years. - Biopsy on 04/29/2018 of the right breast mass consistent with IDC, grade 2-3, lymph node biopsy positive for metastatic disease, ER/PR positive, HER-2 negative, Ki-67 of 5% -Seen by Dr. Arnoldo Morale who recommended right modified radical mastectomy and left simple mastectomy. - I have discussed the results of the bone scan which showed increased uptake in the right greater trochanter and right sacroiliac joint.  I have also discussed the results of the CT scan of the chest abdomen and pelvis which did not show any evidence of metastatic disease.  The uptake in the right greater trochanter was thought to be due to enthesopathic change.  Uptake in the right sacroiliac joint was likely from degenerative changes at the right L5-S1 joint. - Due to the locally advanced nature of her disease, I have recommended neoadjuvant chemotherapy.  We will use dose dense AC every 2 weeks for 4 cycles followed by weekly paclitaxel for 12 weeks.  I have discussed the side effects in detail.  We will arrange for chemo education.  She will need a port placement for chemotherapy.  We will ask Dr. Arnoldo Morale.  I will obtain a baseline echocardiogram to evaluate ejection fraction.  Questions were encouraged and answered to her satisfaction.  2.  Left breast DCIS: -Biopsy on 04/29/2018 of the left breast shows DCIS, ER/PR positive.  Left axillary lymph node biopsy was negative for malignancy.   Total time spent is 40 minutes with more than 50% of the time spent face-to-face discussing scan results, and coordination of care.  Orders placed this encounter:  Orders Placed This Encounter   Procedures  . ECHOCARDIOGRAM COMPLETE      Derek Jack, [place] [phone]

## 2018-05-30 NOTE — H&P (Signed)
Whitney Velez; [id number]; February 13, 1950   HPI Patient is a 68 year old white female who was referred to my care by [facility] radiology, Dr. Shelly Bombard, for evaluation treatment of bilateral breast cancer.  This was found on screening mammography.  Patient has had biopsies at multiple sites in both the right and left breast.  Patient denies any family history of cancer.  She states she has had lumps in both breasts for some time, but was avoiding undergoing mammography.  She denies any nipple discharge. History reviewed. No pertinent past medical history.  History reviewed. No pertinent surgical history.  History reviewed. No pertinent family history.  Current Outpatient Medications on File Prior to Visit  Medication Sig Dispense Refill  . albuterol (PROAIR HFA) 108 (90 Base) MCG/ACT inhaler INHALE TWO PUFFS INTO THE LUNGS EVERY SIX HOURS AS NEEDED FOR WHEEZING 8.5 g 5  . albuterol (PROVENTIL) (2.5 MG/3ML) 0.083% nebulizer solution Use via neb q 4 hrs prn wheezing 25 vial 2  . amLODipine (NORVASC) 10 MG tablet Take 1 tablet (10 mg total) by mouth daily. 30 tablet 4  . aspirin 81 MG tablet Take 81 mg by mouth daily.    . Cholecalciferol (VITAMIN D-3) 1000 UNITS CAPS Take by mouth daily.    . Flaxseed, Linseed, (FLAXSEED OIL) 1000 MG CAPS Take by mouth daily.    . hydrochlorothiazide (HYDRODIURIL) 25 MG tablet Take 1 tablet (25 mg total) by mouth daily. 30 tablet 5  . metFORMIN (GLUCOPHAGE) 500 MG tablet Take 1 tablet (500 mg total) by mouth daily with breakfast. 30 tablet 5  . montelukast (SINGULAIR) 10 MG tablet TAKE ONE TABLET BY MOUTH EVERY NIGHT AT BEDTIME 30 tablet 5  . nabumetone (RELAFEN) 750 MG tablet Take 1 tablet (750 mg total) by mouth 2 (two) times daily. With food 28 tablet 5   Current Facility-Administered Medications on File Prior to Visit  Medication Dose Route Frequency Provider Last Rate Last Dose  . methylPREDNISolone acetate (DEPO-MEDROL) injection 40 mg  40 mg Intra-articular  Once Mikey Kirschner, MD      . methylPREDNISolone acetate (DEPO-MEDROL) injection 40 mg  40 mg Intra-articular Once Mikey Kirschner, MD        Allergies  Allergen Reactions  . Penicillins     Nausea, vomiting, dizziness  . Percocet [Oxycodone-Acetaminophen]     Nausea, vomiting, diarrhea  . Ranitidine Itching    Social History   Substance and Sexual Activity  Alcohol Use Not on file    Social History   Tobacco Use  Smoking Status Never Smoker  Smokeless Tobacco Never Used    Review of Systems  Constitutional: Negative.   HENT: Negative.   Eyes: Negative.   Respiratory: Negative.   Cardiovascular: Negative.   Genitourinary: Negative.   Musculoskeletal: Negative.   Skin: Negative.   Neurological: Negative.   Endo/Heme/Allergies: Negative.   Psychiatric/Behavioral: Negative.     Objective   Vitals:   05/06/18 1336  BP: (!) 156/74  Pulse: 79  Resp: 18  Temp: 98.6 F (37 C)    Physical Exam  Constitutional: She is oriented to person, place, and time. She appears well-developed and well-nourished.  HENT:  Head: Normocephalic and atraumatic.  Neck: Normal range of motion. Neck supple.  Cardiovascular: Normal rate and regular rhythm. Exam reveals no gallop and no friction rub.  Murmur heard. 2 out of 6 systolic ejection murmur.  Pulmonary/Chest: Effort normal and breath sounds normal. No stridor. No respiratory distress. She has no wheezes. She has no  rales.  Lymphadenopathy:    She has no cervical adenopathy.  Neurological: She is alert and oriented to person, place, and time.  Vitals reviewed. Breast: Dominant deep large mass which appears fixated to the chest wall with indentation inferiorly at the 6 to 7 o'clock position.  Dimpling noted in this region.  Right axilla with palpable lymph node.  Left breast with increased density noted centrally extending towards the chest wall.  Left axilla without palpable lymph nodes. Final pathology: Invasive ductal  carcinoma of the right breast, positive lymph node.  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  At this point, would like patient evaluated by oncology for neoadjuvant therapy prior to any surgical intervention.  She will need a right modified radical mastectomy with left simple mastectomy.  I did tell her about the possible need for Port-A-Cath.  The information was given.  Further management is pending evaluation by oncology.

## 2018-05-30 NOTE — Patient Instructions (Signed)
Whitney Velez  05/30/2018     @PREFPERIOPPHARMACY @   Your procedure is scheduled on  06/04/2018 .  Report to [facility] at  840   A.M.  Call this number if you have problems the morning of surgery:  [phone]   Remember:  No food after midnight.  You may drink clear liquids until  12 midnight   Clear liquids allowed are:                    Water, Juice (non-citric and without pulp), Carbonated beverages, Clear Tea, Black Coffee only, Plain Jell-O only, Gatorade and Plain Popsicles only    Take these medicines the morning of surgery with A SIP OF WATER  Amlodipine. Use your nebulizer and your inhaler before you come.    Do not wear jewelry, make-up or nail polish.  Do not wear lotions, powders, or perfumes, or deodorant.  Do not shave 48 hours prior to surgery.  Men may shave face and neck.  Do not bring valuables to the hospital.  [facility] is not responsible for any belongings or valuables.  Contacts, dentures or bridgework may not be worn into surgery.  Leave your suitcase in the car.  After surgery it may be brought to your room.  For patients admitted to the hospital, discharge time will be determined by your treatment team.  Patients discharged the day of surgery will not be allowed to drive home.   Name and phone number of your driver:   family Special instructions:   None  Please read over the following fact sheets that you were given. Pain Booklet, Coughing and Deep Breathing, Anesthesia Post-op Instructions and Care and Recovery After Surgery       Implanted Port Insertion, Care After This sheet gives you information about how to care for yourself after your procedure. Your health care provider may also give you more specific instructions. If you have problems or questions, contact your health care provider. What can I expect after the procedure? After your procedure, it is common to have:  Discomfort at the port insertion site.  Bruising  on the skin over the port. This should improve over 3-4 days.  Follow these instructions at home: [facility] care  After your port is placed, you will get a manufacturer's information card. The card has information about your port. Keep this card with you at all times.  Take care of the port as told by your health care provider. Ask your health care provider if you or a family member can get training for taking care of the port at home. A home health care nurse may also take care of the port.  Make sure to remember what type of port you have. Incision care  Follow instructions from your health care provider about how to take care of your port insertion site. Make sure you: ? Wash your hands with soap and water before you change your bandage (dressing). If soap and water are not available, use hand sanitizer. ? Change your dressing as told by your health care provider. ? Leave stitches (sutures), skin glue, or adhesive strips in place. These skin closures may need to stay in place for 2 weeks or longer. If adhesive strip edges start to loosen and curl up, you may trim the loose edges. Do not remove adhesive strips completely unless your health care provider tells you to do that.  Check your port  insertion site every day for signs of infection. Check for: ? More redness, swelling, or pain. ? More fluid or blood. ? Warmth. ? Pus or a bad smell. General instructions  Do not take baths, swim, or use a hot tub until your health care provider approves.  Do not lift anything that is heavier than 10 lb (4.5 kg) for a week, or as told by your health care provider.  Ask your health care provider when it is okay to: ? Return to work or school. ? Resume usual physical activities or sports.  Do not drive for 24 hours if you were given a medicine to help you relax (sedative).  Take over-the-counter and prescription medicines only as told by your health care provider.  Wear a medical alert bracelet in  case of an emergency. This will tell any health care providers that you have a port.  Keep all follow-up visits as told by your health care provider. This is important. Contact a health care provider if:  You cannot flush your port with saline as directed, or you cannot draw blood from the port.  You have a fever or chills.  You have more redness, swelling, or pain around your port insertion site.  You have more fluid or blood coming from your port insertion site.  Your port insertion site feels warm to the touch.  You have pus or a bad smell coming from the port insertion site. Get help right away if:  You have chest pain or shortness of breath.  You have bleeding from your port that you cannot control. Summary  Take care of the port as told by your health care provider.  Change your dressing as told by your health care provider.  Keep all follow-up visits as told by your health care provider. This information is not intended to replace advice given to you by your health care provider. Make sure you discuss any questions you have with your health care provider. Document Released: 10/07/2013 Document Revised: 11/07/2016 Document Reviewed: 11/07/2016 Elsevier Interactive Patient Education  2017 [place] An implanted port is a type of central line that is placed under the skin. Central lines are used to provide IV access when treatment or nutrition needs to be given through a person's veins. Implanted ports are used for long-term IV access. An implanted port may be placed because:  You need IV medicine that would be irritating to the small veins in your hands or arms.  You need long-term IV medicines, such as antibiotics.  You need IV nutrition for a long period.  You need frequent blood draws for lab tests.  You need dialysis.  Implanted ports are usually placed in the chest area, but they can also be placed in the upper arm, the abdomen,  or the leg. An implanted port has two main parts:  Reservoir. The reservoir is round and will appear as a small, raised area under your skin. The reservoir is the part where a needle is inserted to give medicines or draw blood.  Catheter. The catheter is a thin, flexible tube that extends from the reservoir. The catheter is placed into a large vein. Medicine that is inserted into the reservoir goes into the catheter and then into the vein.  How will I care for my incision site? Do not get the incision site wet. Bathe or shower as directed by your health care provider. How is my port accessed? Special steps must be taken  to access the port:  Before the port is accessed, a numbing cream can be placed on the skin. This helps numb the skin over the port site.  Your health care provider uses a sterile technique to access the port. ? Your health care provider must put on a mask and sterile gloves. ? The skin over your port is cleaned carefully with an antiseptic and allowed to dry. ? The port is gently pinched between sterile gloves, and a needle is inserted into the port.  Only "non-coring" port needles should be used to access the port. Once the port is accessed, a blood return should be checked. This helps ensure that the port is in the vein and is not clogged.  If your port needs to remain accessed for a constant infusion, a clear (transparent) bandage will be placed over the needle site. The bandage and needle will need to be changed every week, or as directed by your health care provider.  Keep the bandage covering the needle clean and dry. Do not get it wet. Follow your health care provider's instructions on how to take a shower or bath while the port is accessed.  If your port does not need to stay accessed, no bandage is needed over the port.  What is flushing? Flushing helps keep the port from getting clogged. Follow your health care provider's instructions on how and when to flush the  port. Ports are usually flushed with saline solution or a medicine called heparin. The need for flushing will depend on how the port is used.  If the port is used for intermittent medicines or blood draws, the port will need to be flushed: ? After medicines have been given. ? After blood has been drawn. ? As part of routine maintenance.  If a constant infusion is running, the port may not need to be flushed.  How long will my port stay implanted? The port can stay in for as long as your health care provider thinks it is needed. When it is time for the port to come out, surgery will be done to remove it. The procedure is similar to the one performed when the port was put in. When should I seek immediate medical care? When you have an implanted port, you should seek immediate medical care if:  You notice a bad smell coming from the incision site.  You have swelling, redness, or drainage at the incision site.  You have more swelling or pain at the port site or the surrounding area.  You have a fever that is not controlled with medicine.  This information is not intended to replace advice given to you by your health care provider. Make sure you discuss any questions you have with your health care provider. Document Released: 12/17/2005 Document Revised: 05/24/2016 Document Reviewed: 08/24/2013 Elsevier Interactive Patient Education  2017 [place] Anesthesia is a term that refers to techniques, procedures, and medicines that help a person stay safe and comfortable during a medical procedure. Monitored anesthesia care, or sedation, is one type of anesthesia. Your anesthesia specialist may recommend sedation if you will be having a procedure that does not require you to be unconscious, such as:  Cataract surgery.  A dental procedure.  A biopsy.  A colonoscopy.  During the procedure, you may receive a medicine to help you relax (sedative). There are three  levels of sedation:  Mild sedation. At this level, you may feel awake and relaxed. You will be  able to follow directions.  Moderate sedation. At this level, you will be sleepy. You may not remember the procedure.  Deep sedation. At this level, you will be asleep. You will not remember the procedure.  The more medicine you are given, the deeper your level of sedation will be. Depending on how you respond to the procedure, the anesthesia specialist may change your level of sedation or the type of anesthesia to fit your needs. An anesthesia specialist will monitor you closely during the procedure. Let your health care provider know about:  Any allergies you have.  All medicines you are taking, including vitamins, herbs, eye drops, creams, and over-the-counter medicines.  Any use of steroids (by mouth or as a cream).  Any problems you or family members have had with sedatives and anesthetic medicines.  Any blood disorders you have.  Any surgeries you have had.  Any medical conditions you have, such as sleep apnea.  Whether you are pregnant or may be pregnant.  Any use of cigarettes, alcohol, or street drugs. What are the risks? Generally, this is a safe procedure. However, problems may occur, including:  Getting too much medicine (oversedation).  Nausea.  Allergic reaction to medicines.  Trouble breathing. If this happens, a breathing tube may be used to help with breathing. It will be removed when you are awake and breathing on your own.  Heart trouble.  Lung trouble.  Before the procedure Staying hydrated Follow instructions from your health care provider about hydration, which may include:  Up to 2 hours before the procedure - you may continue to drink clear liquids, such as water, clear fruit juice, black coffee, and plain tea.  Eating and drinking restrictions Follow instructions from your health care provider about eating and drinking, which may include:  8 hours  before the procedure - stop eating heavy meals or foods such as meat, fried foods, or fatty foods.  6 hours before the procedure - stop eating light meals or foods, such as toast or cereal.  6 hours before the procedure - stop drinking milk or drinks that contain milk.  2 hours before the procedure - stop drinking clear liquids.  Medicines Ask your health care provider about:  Changing or stopping your regular medicines. This is especially important if you are taking diabetes medicines or blood thinners.  Taking medicines such as aspirin and ibuprofen. These medicines can thin your blood. Do not take these medicines before your procedure if your health care provider instructs you not to.  Tests and exams  You will have a physical exam.  You may have blood tests done to show: ? How well your kidneys and liver are working. ? How well your blood can clot.  General instructions  Plan to have someone take you home from the hospital or clinic.  If you will be going home right after the procedure, plan to have someone with you for 24 hours.  What happens during the procedure?  Your blood pressure, heart rate, breathing, level of pain and overall condition will be monitored.  An IV tube will be inserted into one of your veins.  Your anesthesia specialist will give you medicines as needed to keep you comfortable during the procedure. This may mean changing the level of sedation.  The procedure will be performed. After the procedure  Your blood pressure, heart rate, breathing rate, and blood oxygen level will be monitored until the medicines you were given have worn off.  Do not drive for  24 hours if you received a sedative.  You may: ? Feel sleepy, clumsy, or nauseous. ? Feel forgetful about what happened after the procedure. ? Have a sore throat if you had a breathing tube during the procedure. ? Vomit. This information is not intended to replace advice given to you by your  health care provider. Make sure you discuss any questions you have with your health care provider. Document Released: 09/12/2005 Document Revised: 05/25/2016 Document Reviewed: 04/08/2016 Elsevier Interactive Patient Education  2018 [place], Care After These instructions provide you with information about caring for yourself after your procedure. Your health care provider may also give you more specific instructions. Your treatment has been planned according to current medical practices, but problems sometimes occur. Call your health care provider if you have any problems or questions after your procedure. What can I expect after the procedure? After your procedure, it is common to:  Feel sleepy for several hours.  Feel clumsy and have poor balance for several hours.  Feel forgetful about what happened after the procedure.  Have poor judgment for several hours.  Feel nauseous or vomit.  Have a sore throat if you had a breathing tube during the procedure.  Follow these instructions at home: For at least 24 hours after the procedure:   Do not: ? Participate in activities in which you could fall or become injured. ? Drive. ? Use heavy machinery. ? Drink alcohol. ? Take sleeping pills or medicines that cause drowsiness. ? Make important decisions or sign legal documents. ? Take care of children on your own.  Rest. Eating and drinking  Follow the diet that is recommended by your health care provider.  If you vomit, drink water, juice, or soup when you can drink without vomiting.  Make sure you have little or no nausea before eating solid foods. General instructions  Have a responsible adult stay with you until you are awake and alert.  Take over-the-counter and prescription medicines only as told by your health care provider.  If you smoke, do not smoke without supervision.  Keep all follow-up visits as told by your health care provider.  This is important. Contact a health care provider if:  You keep feeling nauseous or you keep vomiting.  You feel light-headed.  You develop a rash.  You have a fever. Get help right away if:  You have trouble breathing. This information is not intended to replace advice given to you by your health care provider. Make sure you discuss any questions you have with your health care provider. Document Released: 04/08/2016 Document Revised: 08/08/2016 Document Reviewed: 04/08/2016 Elsevier Interactive Patient Education  [employer].

## 2018-06-02 ENCOUNTER — Ambulatory Visit (HOSPITAL_COMMUNITY)
Admission: RE | Admit: 2018-06-02 | Discharge: 2018-06-02 | Disposition: A | Discharge status: Home / Self Care | Payer: Medicare Other | Source: Ambulatory Visit | Attending: Hematology | Admitting: Hematology

## 2018-06-02 ENCOUNTER — Encounter (HOSPITAL_COMMUNITY): Payer: Self-pay

## 2018-06-02 ENCOUNTER — Encounter (HOSPITAL_COMMUNITY)
Admission: RE | Admit: 2018-06-02 | Discharge: 2018-06-02 | Disposition: A | Discharge status: Home / Self Care | Payer: Medicare Other | Source: Ambulatory Visit | Attending: General Surgery | Admitting: General Surgery

## 2018-06-02 DIAGNOSIS — I10 Essential (primary) hypertension: Secondary | ICD-10-CM | POA: Insufficient documentation

## 2018-06-02 DIAGNOSIS — Z170 Estrogen receptor positive status [ER+]: Secondary | ICD-10-CM | POA: Insufficient documentation

## 2018-06-02 DIAGNOSIS — E119 Type 2 diabetes mellitus without complications: Secondary | ICD-10-CM | POA: Diagnosis not present

## 2018-06-02 DIAGNOSIS — C50811 Malignant neoplasm of overlapping sites of right female breast: Secondary | ICD-10-CM | POA: Diagnosis not present

## 2018-06-02 DIAGNOSIS — J45909 Unspecified asthma, uncomplicated: Secondary | ICD-10-CM | POA: Diagnosis not present

## 2018-06-02 NOTE — Progress Notes (Signed)
*  PRELIMINARY RESULTS* Echocardiogram 2D Echocardiogram has been performed.  Leavy Cella 06/02/2018, 10:29 AM

## 2018-06-03 NOTE — OR Nursing (Signed)
Potassium  3.0  reported to Dr. Rick Duff, Also reported to lab spoke with [facility].  No Further orders given

## 2018-06-04 ENCOUNTER — Encounter (HOSPITAL_COMMUNITY): Payer: Self-pay | Admitting: *Deleted

## 2018-06-04 ENCOUNTER — Ambulatory Visit (HOSPITAL_COMMUNITY): Payer: Medicare Other | Admitting: Anesthesiology

## 2018-06-04 ENCOUNTER — Other Ambulatory Visit (HOSPITAL_COMMUNITY): Payer: Self-pay

## 2018-06-04 ENCOUNTER — Ambulatory Visit (HOSPITAL_COMMUNITY)
Admission: RE | Admit: 2018-06-04 | Discharge: 2018-06-04 | Disposition: A | Discharge status: Home / Self Care | Payer: Medicare Other | Source: Ambulatory Visit | Attending: General Surgery | Admitting: General Surgery

## 2018-06-04 ENCOUNTER — Ambulatory Visit (HOSPITAL_COMMUNITY): Payer: Medicare Other

## 2018-06-04 ENCOUNTER — Encounter (HOSPITAL_COMMUNITY)
Admission: RE | Disposition: A | Discharge status: Home / Self Care | Payer: Self-pay | Source: Ambulatory Visit | Attending: General Surgery

## 2018-06-04 DIAGNOSIS — Z7982 Long term (current) use of aspirin: Secondary | ICD-10-CM | POA: Diagnosis not present

## 2018-06-04 DIAGNOSIS — I10 Essential (primary) hypertension: Secondary | ICD-10-CM | POA: Insufficient documentation

## 2018-06-04 DIAGNOSIS — Z452 Encounter for adjustment and management of vascular access device: Secondary | ICD-10-CM | POA: Diagnosis not present

## 2018-06-04 DIAGNOSIS — C773 Secondary and unspecified malignant neoplasm of axilla and upper limb lymph nodes: Secondary | ICD-10-CM | POA: Diagnosis not present

## 2018-06-04 DIAGNOSIS — Z95828 Presence of other vascular implants and grafts: Secondary | ICD-10-CM

## 2018-06-04 DIAGNOSIS — Z170 Estrogen receptor positive status [ER+]: Secondary | ICD-10-CM | POA: Diagnosis not present

## 2018-06-04 DIAGNOSIS — Z79899 Other long term (current) drug therapy: Secondary | ICD-10-CM | POA: Diagnosis not present

## 2018-06-04 DIAGNOSIS — C50811 Malignant neoplasm of overlapping sites of right female breast: Secondary | ICD-10-CM

## 2018-06-04 DIAGNOSIS — C50511 Malignant neoplasm of lower-outer quadrant of right female breast: Secondary | ICD-10-CM | POA: Diagnosis not present

## 2018-06-04 DIAGNOSIS — C50919 Malignant neoplasm of unspecified site of unspecified female breast: Secondary | ICD-10-CM | POA: Diagnosis not present

## 2018-06-04 DIAGNOSIS — Z791 Long term (current) use of non-steroidal anti-inflammatories (NSAID): Secondary | ICD-10-CM | POA: Diagnosis not present

## 2018-06-04 DIAGNOSIS — Z7984 Long term (current) use of oral hypoglycemic drugs: Secondary | ICD-10-CM | POA: Diagnosis not present

## 2018-06-04 DIAGNOSIS — C50911 Malignant neoplasm of unspecified site of right female breast: Secondary | ICD-10-CM | POA: Diagnosis not present

## 2018-06-04 DIAGNOSIS — D0512 Intraductal carcinoma in situ of left breast: Secondary | ICD-10-CM | POA: Insufficient documentation

## 2018-06-04 DIAGNOSIS — C50912 Malignant neoplasm of unspecified site of left female breast: Secondary | ICD-10-CM | POA: Diagnosis not present

## 2018-06-04 DIAGNOSIS — Z4682 Encounter for fitting and adjustment of non-vascular catheter: Secondary | ICD-10-CM | POA: Diagnosis not present

## 2018-06-04 HISTORY — PX: PORTACATH PLACEMENT: SHX2246

## 2018-06-04 LAB — CBC WITH DIFFERENTIAL/PLATELET
BASOS PCT: 0 %
Basophils Absolute: 0 10*3/uL (ref 0.0–0.1)
Eosinophils Absolute: 1 10*3/uL — ABNORMAL HIGH (ref 0.0–0.7)
Eosinophils Relative: 8 %
HEMATOCRIT: 47.9 % — AB (ref 36.0–46.0)
HEMOGLOBIN: 12.9 g/dL (ref 12.0–15.0)
LYMPHS ABS: 3 10*3/uL (ref 0.7–4.0)
Lymphocytes Relative: 26 %
MCH: 28.7 pg (ref 26.0–34.0)
MCHC: 26.9 g/dL — AB (ref 30.0–36.0)
MCV: 106.7 fL — ABNORMAL HIGH (ref 78.0–100.0)
MONOS PCT: 9 %
Monocytes Absolute: 1.1 10*3/uL — ABNORMAL HIGH (ref 0.1–1.0)
NEUTROS ABS: 6.4 10*3/uL (ref 1.7–7.7)
NEUTROS PCT: 57 %
Platelets: 268 10*3/uL (ref 150–400)
RBC: 4.49 MIL/uL (ref 3.87–5.11)
RDW: 20.3 % — ABNORMAL HIGH (ref 11.5–15.5)
WBC: 11.5 10*3/uL — ABNORMAL HIGH (ref 4.0–10.5)

## 2018-06-04 LAB — GLUCOSE, CAPILLARY
Glucose-Capillary: 126 mg/dL — ABNORMAL HIGH (ref 65–99)
Glucose-Capillary: 109 mg/dL — ABNORMAL HIGH (ref 65–99)

## 2018-06-04 LAB — COMPREHENSIVE METABOLIC PANEL
ALT: 25 U/L (ref 14–54)
ANION GAP: 14 (ref 5–15)
AST: 30 U/L (ref 15–41)
Albumin: 4.2 g/dL (ref 3.5–5.0)
Alkaline Phosphatase: 77 U/L (ref 38–126)
BUN: 12 mg/dL (ref 6–20)
CHLORIDE: 97 mmol/L — AB (ref 101–111)
CO2: 28 mmol/L (ref 22–32)
Calcium: 10.3 mg/dL (ref 8.9–10.3)
Creatinine, Ser: 0.95 mg/dL (ref 0.44–1.00)
GFR calc non Af Amer: 59 mL/min — ABNORMAL LOW (ref >60)
Glucose, Bld: 124 mg/dL — ABNORMAL HIGH (ref 65–99)
Potassium: 3 mmol/L — ABNORMAL LOW (ref 3.5–5.1)
SODIUM: 139 mmol/L (ref 135–145)
Total Bilirubin: 0.7 mg/dL (ref 0.3–1.2)
Total Protein: 8.4 g/dL — ABNORMAL HIGH (ref 6.5–8.1)

## 2018-06-04 LAB — HEMOGLOBIN A1C
HEMOGLOBIN A1C: 5.7 % — AB (ref 4.8–5.6)
MEAN PLASMA GLUCOSE: 116.89 mg/dL

## 2018-06-04 SURGERY — INSERTION, TUNNELED CENTRAL VENOUS DEVICE, WITH PORT
Anesthesia: Monitor Anesthesia Care | Laterality: Left

## 2018-06-04 MED ORDER — HEPARIN SOD (PORK) LOCK FLUSH 100 UNIT/ML IV SOLN
INTRAVENOUS | Status: AC
  Filled 2018-06-04: qty 5

## 2018-06-04 MED ORDER — TRAMADOL HCL 50 MG PO TABS: 50.0000 mg | ORAL_TABLET | Freq: Four times a day (QID) | ORAL | 0 refills | Status: DC | PRN

## 2018-06-04 MED ORDER — LACTATED RINGERS IV SOLN: INTRAVENOUS | Status: DC

## 2018-06-04 MED ORDER — CHLORHEXIDINE GLUCONATE CLOTH 2 % EX PADS: 6.0000 | MEDICATED_PAD | Freq: Once | CUTANEOUS | Status: DC

## 2018-06-04 MED ORDER — KETOROLAC TROMETHAMINE 30 MG/ML IJ SOLN: 30.0000 mg | Freq: Once | INTRAMUSCULAR | Status: DC | PRN

## 2018-06-04 MED ORDER — VANCOMYCIN HCL IN DEXTROSE 1-5 GM/200ML-% IV SOLN
1000.0000 mg | INTRAVENOUS | Status: AC
  Administered 2018-06-04: 1000 mg via INTRAVENOUS
  Filled 2018-06-04: qty 200

## 2018-06-04 MED ORDER — HEPARIN SOD (PORK) LOCK FLUSH 100 UNIT/ML IV SOLN
INTRAVENOUS | Status: DC | PRN
  Administered 2018-06-04: 500 [IU] via INTRAVENOUS

## 2018-06-04 MED ORDER — LACTATED RINGERS IV SOLN
INTRAVENOUS | Status: DC | PRN
  Administered 2018-06-04: 09:00:00 via INTRAVENOUS

## 2018-06-04 MED ORDER — PROPOFOL 500 MG/50ML IV EMUL
INTRAVENOUS | Status: DC | PRN
  Administered 2018-06-04: 75 ug/kg/min via INTRAVENOUS

## 2018-06-04 MED ORDER — SODIUM CHLORIDE 0.9 % IV SOLN
INTRAVENOUS | Status: AC | PRN
  Administered 2018-06-04: 500 mL via INTRAMUSCULAR

## 2018-06-04 MED ORDER — 0.9 % SODIUM CHLORIDE (POUR BTL) OPTIME: TOPICAL | Status: DC | PRN

## 2018-06-04 MED ORDER — LIDOCAINE HCL (PF) 1 % IJ SOLN
INTRAMUSCULAR | Status: AC
  Filled 2018-06-04: qty 5

## 2018-06-04 MED ORDER — LIDOCAINE HCL (PF) 1 % IJ SOLN
INTRAMUSCULAR | Status: AC
  Filled 2018-06-04: qty 30

## 2018-06-04 MED ORDER — HYDROMORPHONE HCL 1 MG/ML IJ SOLN: 0.2500 mg | INTRAMUSCULAR | Status: DC | PRN

## 2018-06-04 MED ORDER — PROCHLORPERAZINE MALEATE 10 MG PO TABS: 10.0000 mg | ORAL_TABLET | Freq: Four times a day (QID) | ORAL | 1 refills | Status: DC | PRN

## 2018-06-04 MED ORDER — LIDOCAINE-PRILOCAINE 2.5-2.5 % EX CREA: TOPICAL_CREAM | CUTANEOUS | 3 refills | Status: DC

## 2018-06-04 MED ORDER — ONDANSETRON HCL 4 MG/2ML IJ SOLN: 4.0000 mg | Freq: Once | INTRAMUSCULAR | Status: DC | PRN

## 2018-06-04 MED ORDER — LIDOCAINE HCL (PF) 1 % IJ SOLN
INTRAMUSCULAR | Status: DC | PRN
  Administered 2018-06-04: 8 mL

## 2018-06-04 MED ORDER — HYDROCODONE-ACETAMINOPHEN 7.5-325 MG PO TABS: 1.0000 | ORAL_TABLET | Freq: Once | ORAL | Status: DC | PRN

## 2018-06-04 MED ORDER — KETOROLAC TROMETHAMINE 30 MG/ML IJ SOLN: 15.0000 mg | Freq: Once | INTRAMUSCULAR | Status: DC

## 2018-06-04 MED ORDER — PROPOFOL 10 MG/ML IV BOLUS
INTRAVENOUS | Status: DC | PRN
  Administered 2018-06-04: 10 mg via INTRAVENOUS
  Administered 2018-06-04 (×2): 20 mg via INTRAVENOUS

## 2018-06-04 MED ORDER — MEPERIDINE HCL 50 MG/ML IJ SOLN: 6.2500 mg | INTRAMUSCULAR | Status: DC | PRN

## 2018-06-04 SURGICAL SUPPLY — 34 items
BAG DECANTER FOR FLEXI CONT (MISCELLANEOUS) ×3 IMPLANT
CHLORAPREP W/TINT 10.5 ML (MISCELLANEOUS) ×3 IMPLANT
CLOTH BEACON ORANGE TIMEOUT ST (SAFETY) ×3 IMPLANT
COVER LIGHT HANDLE STERIS (MISCELLANEOUS) ×6 IMPLANT
DECANTER SPIKE VIAL GLASS SM (MISCELLANEOUS) ×3 IMPLANT
DERMABOND ADVANCED (GAUZE/BANDAGES/DRESSINGS) ×2
DERMABOND ADVANCED .7 DNX12 (GAUZE/BANDAGES/DRESSINGS) ×1 IMPLANT
DRAPE C-ARM FOLDED MOBILE STRL (DRAPES) ×3 IMPLANT
ELECT REM PT RETURN 9FT ADLT (ELECTROSURGICAL) ×3
ELECTRODE REM PT RTRN 9FT ADLT (ELECTROSURGICAL) ×1 IMPLANT
GLOVE BIO SURGEON STRL SZ 6.5 (GLOVE) ×2 IMPLANT
GLOVE BIO SURGEONS STRL SZ 6.5 (GLOVE) ×1
GLOVE BIOGEL PI IND STRL 6.5 (GLOVE) ×1 IMPLANT
GLOVE BIOGEL PI IND STRL 7.0 (GLOVE) ×2 IMPLANT
GLOVE BIOGEL PI IND STRL 7.5 (GLOVE) ×1 IMPLANT
GLOVE BIOGEL PI INDICATOR 6.5 (GLOVE) ×2
GLOVE BIOGEL PI INDICATOR 7.0 (GLOVE) ×4
GLOVE BIOGEL PI INDICATOR 7.5 (GLOVE) ×2
GLOVE SURG SS PI 7.5 STRL IVOR (GLOVE) ×3 IMPLANT
GOWN STRL REUS W/TWL LRG LVL3 (GOWN DISPOSABLE) ×6 IMPLANT
IV NS 500ML (IV SOLUTION) ×2
IV NS 500ML BAXH (IV SOLUTION) ×1 IMPLANT
KIT PORT POWER 8FR ISP MRI (Port) ×3 IMPLANT
KIT TURNOVER KIT A (KITS) ×3 IMPLANT
NEEDLE HYPO 25X1 1.5 SAFETY (NEEDLE) ×3 IMPLANT
PACK MINOR (CUSTOM PROCEDURE TRAY) ×3 IMPLANT
PAD ARMBOARD 7.5X6 YLW CONV (MISCELLANEOUS) ×3 IMPLANT
SET BASIN LINEN APH (SET/KITS/TRAYS/PACK) ×3 IMPLANT
SUT MNCRL AB 4-0 PS2 18 (SUTURE) ×3 IMPLANT
SUT VIC AB 3-0 SH 27 (SUTURE) ×2
SUT VIC AB 3-0 SH 27X BRD (SUTURE) ×1 IMPLANT
SYR 20CC LL (SYRINGE) ×3 IMPLANT
SYR 5ML LL (SYRINGE) ×3 IMPLANT
SYR CONTROL 10ML LL (SYRINGE) ×3 IMPLANT

## 2018-06-04 NOTE — Interval H&P Note (Signed)
History and Physical Interval Note:  06/04/2018 10:40 AM  Whitney Velez  has presented today for surgery, with the diagnosis of bilaterial brest carcinoma   The various methods of treatment have been discussed with the patient and family. After consideration of risks, benefits and other options for treatment, the patient has consented to  Procedure(s): INSERTION PORT-A-CATH (Right) as a surgical intervention .  The patient's history has been reviewed, patient examined, no change in status, stable for surgery.  I have reviewed the patient's chart and labs.  Questions were answered to the patient's satisfaction.     Aviva Signs

## 2018-06-04 NOTE — Op Note (Signed)
Patient:  Whitney Velez  DOB:  1950/03/31  MRN:  [id number]   Preop Diagnosis: Bilateral breast carcinoma  Postop Diagnosis: Same  Procedure: Port-A-Cath insertion  Surgeon: Aviva Signs, MD  Anes: MAC  Indications: Patient is a 67 year old black female recently diagnosed with bilateral breast carcinoma who now presents for Port-A-Cath insertion.  The risks and benefits of the procedure including bleeding, infection, and pneumothorax were fully explained to the patient, who gave informed consent.  Procedure note: The patient was placed in the Trendelenburg position after the left upper chest was prepped and draped using the usual sterile technique with DuraPrep.  Surgical site confirmation was performed.  1% Xylocaine was used for local anesthesia.  An incision was made below the left clavicle.  A subcutaneous pocket was formed.  A needle was advanced into the left subclavian vein using the Seldinger technique without difficulty.  A guidewire was then advanced into the right atrium under fluoroscopic guidance.  An introducer and peel-away sheath were placed over the guidewire.  The catheter was then inserted to the peel-away sheath and the peel-away sheath was removed.  The catheter was then attached to the port and the port placed in subcutaneous pocket.  Adequate positioning was confirmed by fluoroscopy.  Good backflow of blood was noted on aspiration of the port.  The port was flushed with heparin flush.  Subcutaneous layer was reapproximated using a 3-0 Vicryl interrupted suture.  The skin was closed using a 4-0 Monocryl subcuticular suture.  Dermabond was applied.  All tape and needle counts were correct at the end of the procedure.  The patient was awakened and transferred to PACU in stable condition.  A chest x-ray will be performed at that time.  Complications: None  EBL: Minimal  Specimen: None

## 2018-06-04 NOTE — Anesthesia Preprocedure Evaluation (Signed)
Anesthesia Evaluation  Patient identified by MRN, date of birth, ID band Patient awake    Reviewed: Allergy & Precautions, H&P , NPO status , Patient's Chart, lab work & pertinent test results  Airway Mallampati: II  TM Distance: >3 FB Neck ROM: full    Dental no notable dental hx.    Pulmonary neg pulmonary ROS, asthma ,    Pulmonary exam normal breath sounds clear to auscultation       Cardiovascular Exercise Tolerance: Good hypertension, negative cardio ROS   Rhythm:regular Rate:Normal  Nl EF on echo   Neuro/Psych negative neurological ROS  negative psych ROS   GI/Hepatic negative GI ROS, Neg liver ROS,   Endo/Other  negative endocrine ROSdiabetes  Renal/GU Renal diseasenegative Renal ROS  negative genitourinary   Musculoskeletal   Abdominal   Peds  Hematology negative hematology ROS (+)   Anesthesia Other Findings Breast cancer, right (HCC)  Reproductive/Obstetrics negative OB ROS                             Anesthesia Physical Anesthesia Plan  ASA: III  Anesthesia Plan: MAC   Post-op Pain Management:    Induction:   PONV Risk Score and Plan:   Airway Management Planned:   Additional Equipment:   Intra-op Plan:   Post-operative Plan:   Informed Consent: I have reviewed the patients History and Physical, chart, labs and discussed the procedure including the risks, benefits and alternatives for the proposed anesthesia with the patient or authorized representative who has indicated his/her understanding and acceptance.     Plan Discussed with: CRNA  Anesthesia Plan Comments:         Anesthesia Quick Evaluation

## 2018-06-04 NOTE — Transfer of Care (Signed)
Immediate Anesthesia Transfer of Care Note  Patient: Whitney Velez  Procedure(s) Performed: INSERTION PORT-A-CATH (Right )  Patient Location: PACU  Anesthesia Type:MAC  Level of Consciousness: awake and alert   Airway & Oxygen Therapy: Patient Spontanous Breathing  Post-op Assessment: Report given to RN  Post vital signs: Reviewed and stable  Last Vitals:  Vitals Value Taken Time  BP 119/72 06/04/2018 12:06 PM  Temp    Pulse 87 06/04/2018 12:09 PM  Resp 22 06/04/2018 12:09 PM  SpO2 98 % 06/04/2018 12:09 PM  Vitals shown include unvalidated device data.  Last Pain:  Vitals:   06/04/18 0832  TempSrc: Oral  PainSc: 0-No pain      Patients Stated Pain Goal: 5 (94/17/40 8144)  Complications: No apparent anesthesia complications

## 2018-06-04 NOTE — Discharge Instructions (Signed)
Implanted Port Home Guide °An implanted port is a type of central line that is placed under the skin. Central lines are used to provide IV access when treatment or nutrition needs to be given through a person’s veins. Implanted ports are used for long-term IV access. An implanted port may be placed because: °· You need IV medicine that would be irritating to the small veins in your hands or arms. °· You need long-term IV medicines, such as antibiotics. °· You need IV nutrition for a long period. °· You need frequent blood draws for lab tests. °· You need dialysis. ° °Implanted ports are usually placed in the chest area, but they can also be placed in the upper arm, the abdomen, or the leg. An implanted port has two main parts: °· Reservoir. The reservoir is round and will appear as a small, raised area under your skin. The reservoir is the part where a needle is inserted to give medicines or draw blood. °· Catheter. The catheter is a thin, flexible tube that extends from the reservoir. The catheter is placed into a large vein. Medicine that is inserted into the reservoir goes into the catheter and then into the vein. ° °How will I care for my incision site? °Do not get the incision site wet. Bathe or shower as directed by your health care provider. °How is my port accessed? °Special steps must be taken to access the port: °· Before the port is accessed, a numbing cream can be placed on the skin. This helps numb the skin over the port site. °· Your health care provider uses a sterile technique to access the port. °? Your health care provider must put on a mask and sterile gloves. °? The skin over your port is cleaned carefully with an antiseptic and allowed to dry. °? The port is gently pinched between sterile gloves, and a needle is inserted into the port. °· Only "non-coring" port needles should be used to access the port. Once the port is accessed, a blood return should be checked. This helps ensure that the port  is in the vein and is not clogged. °· If your port needs to remain accessed for a constant infusion, a clear (transparent) bandage will be placed over the needle site. The bandage and needle will need to be changed every week, or as directed by your health care provider. °· Keep the bandage covering the needle clean and dry. Do not get it wet. Follow your health care provider’s instructions on how to take a shower or bath while the port is accessed. °· If your port does not need to stay accessed, no bandage is needed over the port. ° °What is flushing? °Flushing helps keep the port from getting clogged. Follow your health care provider’s instructions on how and when to flush the port. Ports are usually flushed with saline solution or a medicine called heparin. The need for flushing will depend on how the port is used. °· If the port is used for intermittent medicines or blood draws, the port will need to be flushed: °? After medicines have been given. °? After blood has been drawn. °? As part of routine maintenance. °· If a constant infusion is running, the port may not need to be flushed. ° °How long will my port stay implanted? °The port can stay in for as long as your health care provider thinks it is needed. When it is time for the port to come out, surgery will be   done to remove it. The procedure is similar to the one performed when the port was put in. °When should I seek immediate medical care? °When you have an implanted port, you should seek immediate medical care if: °· You notice a bad smell coming from the incision site. °· You have swelling, redness, or drainage at the incision site. °· You have more swelling or pain at the port site or the surrounding area. °· You have a fever that is not controlled with medicine. ° °This information is not intended to replace advice given to you by your health care provider. Make sure you discuss any questions you have with your health care provider. °Document  Released: 12/17/2005 Document Revised: 05/24/2016 Document Reviewed: 08/24/2013 °Elsevier Interactive Patient Education © 2017 [employer] °Implanted Port Insertion, Care After °This sheet gives you information about how to care for yourself after your procedure. Your health care provider may also give you more specific instructions. If you have problems or questions, contact your health care provider. °What can I expect after the procedure? °After your procedure, it is common to have: °· Discomfort at the port insertion site. °· Bruising on the skin over the port. This should improve over 3-4 days. ° °Follow these instructions at home: °Port care °· After your port is placed, you will get a manufacturer's information card. The card has information about your port. Keep this card with you at all times. °· Take care of the port as told by your health care provider. Ask your health care provider if you or a family member can get training for taking care of the port at home. A home health care nurse may also take care of the port. °· Make sure to remember what type of port you have. °Incision care °· Follow instructions from your health care provider about how to take care of your port insertion site. Make sure you: °? Wash your hands with soap and water before you change your bandage (dressing). If soap and water are not available, use hand sanitizer. °? Change your dressing as told by your health care provider. °? Leave stitches (sutures), skin glue, or adhesive strips in place. These skin closures may need to stay in place for 2 weeks or longer. If adhesive strip edges start to loosen and curl up, you may trim the loose edges. Do not remove adhesive strips completely unless your health care provider tells you to do that. °· Check your port insertion site every day for signs of infection. Check for: °? More redness, swelling, or pain. °? More fluid or blood. °? Warmth. °? Pus or a bad smell. °General  instructions °· Do not take baths, swim, or use a hot tub until your health care provider approves. °· Do not lift anything that is heavier than 10 lb (4.5 kg) for a week, or as told by your health care provider. °· Ask your health care provider when it is okay to: °? Return to work or school. °? Resume usual physical activities or sports. °· Do not drive for 24 hours if you were given a medicine to help you relax (sedative). °· Take over-the-counter and prescription medicines only as told by your health care provider. °· Wear a medical alert bracelet in case of an emergency. This will tell any health care providers that you have a port. °· Keep all follow-up visits as told by your health care provider. This is important. °Contact a health care provider if: °· You cannot   flush your port with saline as directed, or you cannot draw blood from the port.  You have a fever or chills.  You have more redness, swelling, or pain around your port insertion site.  You have more fluid or blood coming from your port insertion site.  Your port insertion site feels warm to the touch.  You have pus or a bad smell coming from the port insertion site. Get help right away if:  You have chest pain or shortness of breath.  You have bleeding from your port that you cannot control. Summary  Take care of the port as told by your health care provider.  Change your dressing as told by your health care provider.  Keep all follow-up visits as told by your health care provider. This information is not intended to replace advice given to you by your health care provider. Make sure you discuss any questions you have with your health care provider.   Monitored Anesthesia Care Anesthesia is a term that refers to techniques, procedures, and medicines that help a person stay safe and comfortable during a medical procedure. Monitored anesthesia care, or sedation, is one type of anesthesia. Your anesthesia specialist may  recommend sedation if you will be having a procedure that does not require you to be unconscious, such as: Cataract surgery. A dental procedure. A biopsy. A colonoscopy.  During the procedure, you may receive a medicine to help you relax (sedative). There are three levels of sedation: Mild sedation. At this level, you may feel awake and relaxed. You will be able to follow directions. Moderate sedation. At this level, you will be sleepy. You may not remember the procedure. Deep sedation. At this level, you will be asleep. You will not remember the procedure.  The more medicine you are given, the deeper your level of sedation will be. Depending on how you respond to the procedure, the anesthesia specialist may change your level of sedation or the type of anesthesia to fit your needs. An anesthesia specialist will monitor you closely during the procedure. Let your health care provider know about: Any allergies you have. All medicines you are taking, including vitamins, herbs, eye drops, creams, and over-the-counter medicines. Any use of steroids (by mouth or as a cream). Any problems you or family members have had with sedatives and anesthetic medicines. Any blood disorders you have. Any surgeries you have had. Any medical conditions you have, such as sleep apnea. Whether you are pregnant or may be pregnant. Any use of cigarettes, alcohol, or street drugs. What are the risks? Generally, this is a safe procedure. However, problems may occur, including: Getting too much medicine (oversedation). Nausea. Allergic reaction to medicines. Trouble breathing. If this happens, a breathing tube may be used to help with breathing. It will be removed when you are awake and breathing on your own. Heart trouble. Lung trouble.  Before the procedure Staying hydrated Follow instructions from your health care provider about hydration, which may include: Up to 2 hours before the procedure - you may  continue to drink clear liquids, such as water, clear fruit juice, black coffee, and plain tea.  Eating and drinking restrictions Follow instructions from your health care provider about eating and drinking, which may include: 8 hours before the procedure - stop eating heavy meals or foods such as meat, fried foods, or fatty foods. 6 hours before the procedure - stop eating light meals or foods, such as toast or cereal. 6 hours before the procedure -  stop drinking milk or drinks that contain milk. 2 hours before the procedure - stop drinking clear liquids.  Medicines Ask your health care provider about: Changing or stopping your regular medicines. This is especially important if you are taking diabetes medicines or blood thinners. Taking medicines such as aspirin and ibuprofen. These medicines can thin your blood. Do not take these medicines before your procedure if your health care provider instructs you not to.  Tests and exams You will have a physical exam. You may have blood tests done to show: How well your kidneys and liver are working. How well your blood can clot.  General instructions Plan to have someone take you home from the hospital or clinic. If you will be going home right after the procedure, plan to have someone with you for 24 hours.  What happens during the procedure? Your blood pressure, heart rate, breathing, level of pain and overall condition will be monitored. An IV tube will be inserted into one of your veins. Your anesthesia specialist will give you medicines as needed to keep you comfortable during the procedure. This may mean changing the level of sedation. The procedure will be performed. After the procedure Your blood pressure, heart rate, breathing rate, and blood oxygen level will be monitored until the medicines you were given have worn off. Do not drive for 24 hours if you received a sedative. You may: Feel sleepy, clumsy, or nauseous. Feel  forgetful about what happened after the procedure. Have a sore throat if you had a breathing tube during the procedure. Vomit. This information is not intended to replace advice given to you by your health care provider. Make sure you discuss any questions you have with your health care provider. Document Released: 09/12/2005 Document Revised: 05/25/2016 Document Reviewed: 04/08/2016 Elsevier Interactive Patient Education  [employer].

## 2018-06-04 NOTE — Anesthesia Postprocedure Evaluation (Signed)
Anesthesia Post Note  Patient: Whitney Velez  Procedure(s) Performed: INSERTION PORT-A-CATH (Right )  Patient location during evaluation: Short Stay Anesthesia Type: MAC Level of consciousness: awake and alert and oriented Pain management: pain level controlled Vital Signs Assessment: post-procedure vital signs reviewed and stable Respiratory status: spontaneous breathing Cardiovascular status: blood pressure returned to baseline and stable Postop Assessment: no apparent nausea or vomiting and adequate PO intake Anesthetic complications: no     Last Vitals:  Vitals:   06/04/18 1245 06/04/18 1302  BP: 131/74 136/63  Pulse: 70 70  Resp: 20 18  Temp:  36.6 C  SpO2: 97% 100%    Last Pain:  Vitals:   06/04/18 1302  TempSrc: Oral  PainSc: 0-No pain                 Rayanne Padmanabhan

## 2018-06-05 ENCOUNTER — Other Ambulatory Visit (HOSPITAL_COMMUNITY): Payer: Medicare Other

## 2018-06-05 ENCOUNTER — Inpatient Hospital Stay (HOSPITAL_COMMUNITY): Payer: Medicare Other | Attending: Hematology

## 2018-06-05 ENCOUNTER — Inpatient Hospital Stay (HOSPITAL_COMMUNITY): Payer: Medicare Other

## 2018-06-05 ENCOUNTER — Inpatient Hospital Stay (HOSPITAL_BASED_OUTPATIENT_CLINIC_OR_DEPARTMENT_OTHER): Payer: Medicare Other | Admitting: Hematology

## 2018-06-05 ENCOUNTER — Encounter (HOSPITAL_COMMUNITY): Payer: Self-pay | Admitting: Hematology

## 2018-06-05 ENCOUNTER — Other Ambulatory Visit: Payer: Self-pay

## 2018-06-05 VITALS — BP 103/48 | HR 79 | Temp 98.1°F | Resp 18

## 2018-06-05 VITALS — BP 116/83 | HR 91 | Resp 18 | Wt 175.0 lb

## 2018-06-05 DIAGNOSIS — C50411 Malignant neoplasm of upper-outer quadrant of right female breast: Secondary | ICD-10-CM | POA: Diagnosis not present

## 2018-06-05 DIAGNOSIS — Z5189 Encounter for other specified aftercare: Secondary | ICD-10-CM | POA: Insufficient documentation

## 2018-06-05 DIAGNOSIS — Z86000 Personal history of in-situ neoplasm of breast: Secondary | ICD-10-CM | POA: Insufficient documentation

## 2018-06-05 DIAGNOSIS — C773 Secondary and unspecified malignant neoplasm of axilla and upper limb lymph nodes: Secondary | ICD-10-CM

## 2018-06-05 DIAGNOSIS — C50811 Malignant neoplasm of overlapping sites of right female breast: Secondary | ICD-10-CM

## 2018-06-05 DIAGNOSIS — Z170 Estrogen receptor positive status [ER+]: Secondary | ICD-10-CM | POA: Insufficient documentation

## 2018-06-05 DIAGNOSIS — Z5111 Encounter for antineoplastic chemotherapy: Secondary | ICD-10-CM | POA: Insufficient documentation

## 2018-06-05 DIAGNOSIS — E876 Hypokalemia: Secondary | ICD-10-CM

## 2018-06-05 DIAGNOSIS — I10 Essential (primary) hypertension: Secondary | ICD-10-CM | POA: Insufficient documentation

## 2018-06-05 DIAGNOSIS — D0512 Intraductal carcinoma in situ of left breast: Secondary | ICD-10-CM | POA: Diagnosis not present

## 2018-06-05 DIAGNOSIS — D72829 Elevated white blood cell count, unspecified: Secondary | ICD-10-CM | POA: Diagnosis not present

## 2018-06-05 MED ORDER — POTASSIUM CHLORIDE CRYS ER 20 MEQ PO TBCR
EXTENDED_RELEASE_TABLET | ORAL | Status: AC
  Filled 2018-06-05: qty 2

## 2018-06-05 MED ORDER — SODIUM CHLORIDE 0.9 % IV SOLN
600.0000 mg/m2 | Freq: Once | INTRAVENOUS | Status: AC
  Administered 2018-06-05: 1100 mg via INTRAVENOUS
  Filled 2018-06-05: qty 5

## 2018-06-05 MED ORDER — PALONOSETRON HCL INJECTION 0.25 MG/5ML
0.2500 mg | Freq: Once | INTRAVENOUS | Status: AC
  Administered 2018-06-05: 0.25 mg via INTRAVENOUS
  Filled 2018-06-05: qty 5

## 2018-06-05 MED ORDER — POTASSIUM CHLORIDE CRYS ER 20 MEQ PO TBCR
40.0000 meq | EXTENDED_RELEASE_TABLET | Freq: Once | ORAL | Status: AC
  Administered 2018-06-05: 40 meq via ORAL

## 2018-06-05 MED ORDER — SODIUM CHLORIDE 0.9 % IV SOLN
Freq: Once | INTRAVENOUS | Status: AC
  Administered 2018-06-05: 12:00:00 via INTRAVENOUS

## 2018-06-05 MED ORDER — HEPARIN SOD (PORK) LOCK FLUSH 100 UNIT/ML IV SOLN
500.0000 [IU] | Freq: Once | INTRAVENOUS | Status: AC | PRN
  Administered 2018-06-05: 500 [IU]

## 2018-06-05 MED ORDER — FOSAPREPITANT DIMEGLUMINE INJECTION 150 MG
Freq: Once | INTRAVENOUS | Status: AC
  Administered 2018-06-05: 12:00:00 via INTRAVENOUS
  Filled 2018-06-05: qty 5

## 2018-06-05 MED ORDER — DOXORUBICIN HCL CHEMO IV INJECTION 2 MG/ML
60.0000 mg/m2 | Freq: Once | INTRAVENOUS | Status: AC
  Administered 2018-06-05: 110 mg via INTRAVENOUS
  Filled 2018-06-05: qty 55

## 2018-06-05 MED ORDER — POTASSIUM CHLORIDE CRYS ER 20 MEQ PO TBCR: 20.0000 meq | EXTENDED_RELEASE_TABLET | Freq: Every day | ORAL | 2 refills | Status: DC

## 2018-06-05 NOTE — Progress Notes (Signed)
1130 - Chemotherapy education packet, including specific information about the particular chemo agents, given and reviewed in detail with patient and daughter. Pt and family verbalize understanding.  Pt and family encouraged to voice any questions should they have them during treatment today.  Tolerated tx w/o adverse reaction.  Alert, in no distress.  VSS.  Discharged ambulatory in c/o family.

## 2018-06-05 NOTE — Progress Notes (Signed)
[facility] [place], [place] [address]   CLINIC:  Medical Oncology/Hematology  PCP:  Mikey Kirschner, [place] [address] [phone]   REASON FOR VISIT:  Follow-up for right breast cancer.  CURRENT THERAPY: Dose dense AC.  BRIEF ONCOLOGIC HISTORY:    Breast cancer, right ([place])   05/12/2018 Initial Diagnosis    Breast cancer, right ([place])      06/02/2018 -  Chemotherapy    The patient had DOXOrubicin (ADRIAMYCIN) chemo injection 110 mg, 60 mg/m2 = 110 mg, Intravenous,  Once, 1 of 4 cycles Administration: 110 mg (06/05/2018) palonosetron (ALOXI) injection 0.25 mg, 0.25 mg, Intravenous,  Once, 1 of 4 cycles Administration: 0.25 mg (06/05/2018) pegfilgrastim-cbqv (UDENYCA) injection 6 mg, 6 mg, Subcutaneous, Once, 1 of 4 cycles cyclophosphamide (CYTOXAN) 1,100 mg in sodium chloride 0.9 % 250 mL chemo infusion, 600 mg/m2 = 1,100 mg, Intravenous,  Once, 1 of 4 cycles Administration: 1,100 mg (06/05/2018) PACLitaxel (TAXOL) 144 mg in sodium chloride 0.9 % 250 mL chemo infusion (</= 21m/m2), 80 mg/m2, Intravenous,  Once, 0 of 12 cycles fosaprepitant (EMEND) 150 mg, dexamethasone (DECADRON) 12 mg in sodium chloride 0.9 % 145 mL IVPB, , Intravenous,  Once, 1 of 4 cycles Administration:  (06/05/2018)  for chemotherapy treatment.         CANCER STAGING: Cancer Staging No matching staging information was found for the patient.   INTERVAL HISTORY:  Ms. [id number]. female returns for follow-up and initiate her chemotherapy.  She had port placed.  She had an echocardiogram done.  She denies any sick symptoms of PND or orthopnea.  She does take diuretic every day.  She denies any palpitations.  No chest pains were reported.  REVIEW OF SYSTEMS:  Review of Systems  All other systems reviewed and are negative.    PAST MEDICAL/SURGICAL HISTORY:  Past Medical History:  Diagnosis Date  . Asthma   . Hypertension    Past  Surgical History:  Procedure Laterality Date  . ABDOMINAL HYSTERECTOMY    . PORTACATH PLACEMENT Right 06/04/2018   Procedure: INSERTION PORT-A-CATH;  Surgeon: JAviva Signs MD;  Location: [facility];  Service: General;  Laterality: Right;     SOCIAL HISTORY:  Social History   Socioeconomic History  . Marital status: Widowed    Spouse name: Not on file  . Number of children: Not on file  . Years of education: Not on file  . Highest education level: Not on file  Occupational History  . Not on file  Social Needs  . Financial resource strain: Not on file  . Food insecurity:    Worry: Not on file    Inability: Not on file  . Transportation needs:    Medical: Not on file    Non-medical: Not on file  Tobacco Use  . Smoking status: Never Smoker  . Smokeless tobacco: Never Used  Substance and Sexual Activity  . Alcohol use: Never    Frequency: Never  . Drug use: Never  . Sexual activity: Not on file  Lifestyle  . Physical activity:    Days per week: Not on file    Minutes per session: Not on file  . Stress: Not on file  Relationships  . Social connections:    Talks on phone: Not on file    Gets together: Not on file    Attends religious service: Not on file    Active member of club or organization:  Not on file    Attends meetings of clubs or organizations: Not on file    Relationship status: Not on file  . Intimate partner violence:    Fear of current or ex partner: Not on file    Emotionally abused: Not on file    Physically abused: Not on file    Forced sexual activity: Not on file  Other Topics Concern  . Not on file  Social History Narrative  . Not on file    FAMILY HISTORY:  History reviewed. No pertinent family history.  CURRENT MEDICATIONS:  Outpatient Encounter Medications as of 06/05/2018  Medication Sig  . albuterol (PROAIR HFA) 108 (90 Base) MCG/ACT inhaler INHALE TWO PUFFS INTO THE LUNGS EVERY SIX HOURS AS NEEDED FOR WHEEZING  . albuterol (PROVENTIL) (2.5  MG/3ML) 0.083% nebulizer solution Use via neb q 4 hrs prn wheezing (Patient taking differently: Take 2.5 mg by nebulization every 4 (four) hours as needed for wheezing or shortness of breath. Use via neb q 4 hrs prn wheezing)  . amLODipine (NORVASC) 10 MG tablet Take 1 tablet (10 mg total) by mouth daily.  Marland Kitchen aspirin 81 MG tablet Take 81 mg by mouth daily.  . Cholecalciferol (VITAMIN D-3) 1000 UNITS CAPS Take 1 capsule by mouth daily.   . Flaxseed, Linseed, (FLAXSEED OIL) 1000 MG CAPS Take 1 capsule by mouth daily.   . hydrochlorothiazide (HYDRODIURIL) 25 MG tablet Take 1 tablet (25 mg total) by mouth daily.  Marland Kitchen lidocaine-prilocaine (EMLA) cream Apply to affected area once  . metFORMIN (GLUCOPHAGE) 500 MG tablet Take 1 tablet (500 mg total) by mouth daily with breakfast.  . montelukast (SINGULAIR) 10 MG tablet TAKE ONE TABLET BY MOUTH EVERY NIGHT AT BEDTIME  . nabumetone (RELAFEN) 750 MG tablet Take 1 tablet (750 mg total) by mouth 2 (two) times daily. With food (Patient taking differently: Take 750 mg by mouth 2 (two) times daily as needed for mild pain. With food)  . prochlorperazine (COMPAZINE) 10 MG tablet Take 1 tablet (10 mg total) by mouth every 6 (six) hours as needed (Nausea or vomiting).  . traMADol (ULTRAM) 50 MG tablet Take 1 tablet (50 mg total) by mouth every 6 (six) hours as needed.   No facility-administered encounter medications on file as of 06/05/2018.     ALLERGIES:  Allergies  Allergen Reactions  . Penicillins Other (See Comments)    Nausea, vomiting, dizziness Has patient had a PCN reaction causing immediate rash, facial/tongue/throat swelling, SOB or lightheadedness with hypotension: no Has patient had a PCN reaction causing severe rash involving mucus membranes or skin necrosis: no Has patient had a PCN reaction that required hospitalization: no Has patient had a PCN reaction occurring within the last 10 years: no If all of the above answers are "NO", then may proceed  with Cephalosporin use.   Marland Kitchen Percocet [Oxycodone-Acetaminophen]     Nausea, vomiting, diarrhea  . Ranitidine Itching     PHYSICAL EXAM:  ECOG Performance status: 0  Vitals:   06/05/18 1056  BP: 116/83  Pulse: 91  Resp: 18  SpO2: 100%   Filed Weights   06/05/18 1056  Weight: 175 lb (79.4 kg)    Physical Exam Port site looks well-healed.  LABORATORY DATA:  I have reviewed the labs as listed.  CBC    Component Value Date/Time   WBC 11.5 (H) 06/04/2018 1205   RBC 4.49 06/04/2018 1205   HGB 12.9 06/04/2018 1205   HGB 12.4 02/07/2017 1109   HCT  47.9 (H) 06/04/2018 1205   HCT 38.2 02/07/2017 1109   PLT 268 06/04/2018 1205   PLT 335 02/07/2017 1109   MCV 106.7 (H) 06/04/2018 1205   MCV 86 02/07/2017 1109   MCH 28.7 06/04/2018 1205   MCHC 26.9 (L) 06/04/2018 1205   RDW 20.3 (H) 06/04/2018 1205   RDW 15.1 02/07/2017 1109   LYMPHSABS 3.0 06/04/2018 1205   LYMPHSABS 3.0 02/07/2017 1109   MONOABS 1.1 (H) 06/04/2018 1205   EOSABS 1.0 (H) 06/04/2018 1205   EOSABS 1.1 (H) 02/07/2017 1109   BASOSABS 0.0 06/04/2018 1205   BASOSABS 0.0 02/07/2017 1109   CMP Latest Ref Rng & Units 06/04/2018 05/12/2018 05/12/2018  Glucose 65 - 99 mg/dL 124(H) 102(H) -  BUN 6 - 20 mg/dL 12 19 -  Creatinine 0.44 - 1.00 mg/dL 0.95 1.00 -  Sodium 135 - 145 mmol/L 139 136 -  Potassium 3.5 - 5.1 mmol/L 3.0(L) 3.0(L) -  Chloride 101 - 111 mmol/L 97(L) 95(L) -  CO2 22 - 32 mmol/L 28 28 -  Calcium 8.9 - 10.3 mg/dL 10.3 9.9 9.9  Total Protein 6.5 - 8.1 g/dL 8.4(H) 9.0(H) -  Total Bilirubin 0.3 - 1.2 mg/dL 0.7 0.4 -  Alkaline Phos 38 - 126 U/L 77 79 -  AST 15 - 41 U/L 30 29 -  ALT 14 - 54 U/L 25 23 -       DIAGNOSTIC IMAGING:  I have personally reviewed the echocardiogram report with ejection fraction of 65 to 70%.    ASSESSMENT & PLAN:   Breast cancer, right ([place]) 1.  Locally advanced (stage IIIa, T3N1) right breast cancer: - Mammogram on 03/28/2018 was BI-RADS Category 0.  Mammogram and  ultrasound on 04/22/2018 shows 5.9 x 5 x 6 cm mass extending in the upper quadrant of the right breast and right axillary lymph node measuring 2.8 x 1.6 x 1.7 cm.  Left breast has retroareolar mass.  Patient did not have mammogram in the last 20 years. - Biopsy on 04/29/2018 of the right breast mass consistent with IDC, grade 2-3, lymph node biopsy positive for metastatic disease, ER/PR positive, HER-2 negative, Ki-67 of 5% -Seen by Dr. Arnoldo Morale who recommended right modified radical mastectomy and left simple mastectomy. - I have discussed the results of the bone scan which showed increased uptake in the right greater trochanter and right sacroiliac joint.  I have also discussed the results of the CT scan of the chest abdomen and pelvis which did not show any evidence of metastatic disease.  The uptake in the right greater trochanter was thought to be due to enthesopathic change.  Uptake in the right sacroiliac joint was likely from degenerative changes at the right L5-S1 joint. - Due to advanced nature of her breast cancer, we have recommended neoadjuvant chemotherapy with dose dense AC followed by weekly paclitaxel for 12 weeks.  We have discussed the side effects again in detail.  She had a port placed.  I discussed the results of 2D echocardiogram which shows ejection fraction of 65 to 70%.  I have reviewed her blood work.  She will proceed with her first cycle without any dose modifications.  2.  Left breast DCIS: -Biopsy on 04/29/2018 of the left breast shows DCIS, ER/PR positive.  Left axillary lymph node biopsy was negative for malignancy.  3.  Hypokalemia: -Her potassium today is 3.  A month ago it was also low at 3.  We will give her potassium 40 mg today.  We  will prescribe her potassium 20 mEq daily.      Orders placed this encounter:  Orders Placed This Encounter  Procedures  . CBC with Differential (House Only)  . CMP ([place] only)      Derek Jack, MD [place] [phone]

## 2018-06-05 NOTE — Assessment & Plan Note (Addendum)
1.  Locally advanced (stage IIIa, T3N1) right breast cancer: - Mammogram on 03/28/2018 was BI-RADS Category 0.  Mammogram and ultrasound on 04/22/2018 shows 5.9 x 5 x 6 cm mass extending in the upper quadrant of the right breast and right axillary lymph node measuring 2.8 x 1.6 x 1.7 cm.  Left breast has retroareolar mass.  Patient did not have mammogram in the last 20 years. - Biopsy on 04/29/2018 of the right breast mass consistent with IDC, grade 2-3, lymph node biopsy positive for metastatic disease, ER/PR positive, HER-2 negative, Ki-67 of 5% -Seen by Dr. Jenkins who recommended right modified radical mastectomy and left simple mastectomy. - I have discussed the results of the bone scan which showed increased uptake in the right greater trochanter and right sacroiliac joint.  I have also discussed the results of the CT scan of the chest abdomen and pelvis which did not show any evidence of metastatic disease.  The uptake in the right greater trochanter was thought to be due to enthesopathic change.  Uptake in the right sacroiliac joint was likely from degenerative changes at the right L5-S1 joint. - Due to advanced nature of her breast cancer, we have recommended neoadjuvant chemotherapy with dose dense AC followed by weekly paclitaxel for 12 weeks.  We have discussed the side effects again in detail.  She had a port placed.  I discussed the results of 2D echocardiogram which shows ejection fraction of 65 to 70%.  I have reviewed her blood work.  She will proceed with her first cycle without any dose modifications.  2.  Left breast DCIS: -Biopsy on 04/29/2018 of the left breast shows DCIS, ER/PR positive.  Left axillary lymph node biopsy was negative for malignancy.  3.  Hypokalemia: -Her potassium today is 3.  A month ago it was also low at 3.  We will give her potassium 40 mg today.  We will prescribe her potassium 20 mEq daily. 

## 2018-06-05 NOTE — Addendum Note (Signed)
Addendum  created 06/05/18 0746 by Vista Deck, CRNA   Charge Capture section accepted

## 2018-06-06 ENCOUNTER — Inpatient Hospital Stay (HOSPITAL_COMMUNITY): Payer: Medicare Other

## 2018-06-06 ENCOUNTER — Encounter (HOSPITAL_COMMUNITY): Payer: Self-pay

## 2018-06-06 VITALS — BP 138/48 | HR 77 | Temp 98.4°F | Resp 18

## 2018-06-06 DIAGNOSIS — C773 Secondary and unspecified malignant neoplasm of axilla and upper limb lymph nodes: Secondary | ICD-10-CM | POA: Diagnosis not present

## 2018-06-06 DIAGNOSIS — C50811 Malignant neoplasm of overlapping sites of right female breast: Secondary | ICD-10-CM

## 2018-06-06 DIAGNOSIS — D72829 Elevated white blood cell count, unspecified: Secondary | ICD-10-CM | POA: Diagnosis not present

## 2018-06-06 DIAGNOSIS — E876 Hypokalemia: Secondary | ICD-10-CM | POA: Diagnosis not present

## 2018-06-06 DIAGNOSIS — Z170 Estrogen receptor positive status [ER+]: Principal | ICD-10-CM

## 2018-06-06 DIAGNOSIS — Z5189 Encounter for other specified aftercare: Secondary | ICD-10-CM | POA: Diagnosis not present

## 2018-06-06 DIAGNOSIS — C50411 Malignant neoplasm of upper-outer quadrant of right female breast: Secondary | ICD-10-CM | POA: Diagnosis not present

## 2018-06-06 DIAGNOSIS — Z5111 Encounter for antineoplastic chemotherapy: Secondary | ICD-10-CM | POA: Diagnosis not present

## 2018-06-06 MED ORDER — PEGFILGRASTIM-CBQV 6 MG/0.6ML ~~LOC~~ SOSY
PREFILLED_SYRINGE | SUBCUTANEOUS | Status: AC
  Filled 2018-06-06: qty 0.6

## 2018-06-06 MED ORDER — PEGFILGRASTIM-CBQV 6 MG/0.6ML ~~LOC~~ SOSY
6.0000 mg | PREFILLED_SYRINGE | Freq: Once | SUBCUTANEOUS | Status: AC
  Administered 2018-06-06: 6 mg via SUBCUTANEOUS

## 2018-06-06 NOTE — Progress Notes (Signed)
Patient tolerated injection with no complaints voiced.  Site clean and dry with no bruising or swelling noted.  Band aid applied.  Reviewed side effects of udenyca and how to manage with understanding verbalized.    Patient stated she did well after chemotherapy yesterday.  Denied fevers or chills.  A "little bit" of nausea this morning but used her medication with relief.  Reminded the patient and family of the triage nurse over the weekend with understanding verbalized.  Patient left ambulatory with family with no s/s of distress noted.

## 2018-06-06 NOTE — Patient Instructions (Signed)
[place] at [facility]  Discharge Instructions  You received a udenyca shot today for your WBC's today.   _______________________________________________________________  Thank you for choosing [place] at [facility] to provide your oncology and hematology care.  To afford each patient quality time with our providers, please arrive at least 15 minutes before your scheduled appointment.  You need to re-schedule your appointment if you arrive 10 or more minutes late.  We strive to give you quality time with our providers, and arriving late affects you and other patients whose appointments are after yours.  Also, if you no show three or more times for appointments you may be dismissed from the clinic.  Again, thank you for choosing [facility] at [place] hope is that these requests will allow you access to exceptional care and in a timely manner. _______________________________________________________________  If you have questions after your visit, please contact our office at [phone] between the hours of 8:30 a.m. and 5:00 p.m. Voicemails left after 4:30 p.m. will not be returned until the following business day. _______________________________________________________________  For prescription refill requests, have your pharmacy contact our office. _______________________________________________________________  Recommendations made by the consultant and any test results will be sent to your referring physician. _______________________________________________________________

## 2018-06-09 ENCOUNTER — Ambulatory Visit (HOSPITAL_COMMUNITY): Payer: Medicare Other

## 2018-06-09 ENCOUNTER — Inpatient Hospital Stay (HOSPITAL_COMMUNITY): Payer: Medicare Other

## 2018-06-09 ENCOUNTER — Other Ambulatory Visit (HOSPITAL_COMMUNITY): Payer: Medicare Other

## 2018-06-19 ENCOUNTER — Inpatient Hospital Stay (HOSPITAL_COMMUNITY): Payer: Medicare Other

## 2018-06-19 ENCOUNTER — Inpatient Hospital Stay (HOSPITAL_BASED_OUTPATIENT_CLINIC_OR_DEPARTMENT_OTHER): Payer: Medicare Other | Admitting: Internal Medicine

## 2018-06-19 ENCOUNTER — Encounter (HOSPITAL_COMMUNITY): Payer: Self-pay

## 2018-06-19 VITALS — BP 103/45 | HR 60 | Temp 98.6°F | Resp 18 | Wt 167.5 lb

## 2018-06-19 DIAGNOSIS — Z170 Estrogen receptor positive status [ER+]: Principal | ICD-10-CM

## 2018-06-19 DIAGNOSIS — C773 Secondary and unspecified malignant neoplasm of axilla and upper limb lymph nodes: Secondary | ICD-10-CM | POA: Diagnosis not present

## 2018-06-19 DIAGNOSIS — C50411 Malignant neoplasm of upper-outer quadrant of right female breast: Secondary | ICD-10-CM

## 2018-06-19 DIAGNOSIS — I10 Essential (primary) hypertension: Secondary | ICD-10-CM

## 2018-06-19 DIAGNOSIS — Z5111 Encounter for antineoplastic chemotherapy: Secondary | ICD-10-CM | POA: Diagnosis not present

## 2018-06-19 DIAGNOSIS — C50811 Malignant neoplasm of overlapping sites of right female breast: Secondary | ICD-10-CM

## 2018-06-19 DIAGNOSIS — D72829 Elevated white blood cell count, unspecified: Secondary | ICD-10-CM

## 2018-06-19 DIAGNOSIS — E876 Hypokalemia: Secondary | ICD-10-CM | POA: Diagnosis not present

## 2018-06-19 DIAGNOSIS — Z5189 Encounter for other specified aftercare: Secondary | ICD-10-CM | POA: Diagnosis not present

## 2018-06-19 DIAGNOSIS — D0512 Intraductal carcinoma in situ of left breast: Secondary | ICD-10-CM | POA: Diagnosis not present

## 2018-06-19 LAB — COMPREHENSIVE METABOLIC PANEL
ALBUMIN: 3.8 g/dL (ref 3.5–5.0)
ALK PHOS: 98 U/L (ref 38–126)
ALT: 18 U/L (ref 14–54)
ANION GAP: 12 (ref 5–15)
AST: 24 U/L (ref 15–41)
BUN: 7 mg/dL (ref 6–20)
CALCIUM: 9.3 mg/dL (ref 8.9–10.3)
CHLORIDE: 97 mmol/L — AB (ref 101–111)
CO2: 27 mmol/L (ref 22–32)
Creatinine, Ser: 0.88 mg/dL (ref 0.44–1.00)
GFR calc Af Amer: >60 mL/min (ref >60)
GFR calc non Af Amer: >60 mL/min (ref >60)
GLUCOSE: 119 mg/dL — AB (ref 65–99)
POTASSIUM: 2.9 mmol/L — AB (ref 3.5–5.1)
SODIUM: 136 mmol/L (ref 135–145)
Total Bilirubin: 0.5 mg/dL (ref 0.3–1.2)
Total Protein: 7.7 g/dL (ref 6.5–8.1)

## 2018-06-19 LAB — DIFFERENTIAL
BASOS PCT: 0 %
Basophils Absolute: 0 10*3/uL (ref 0.0–0.1)
EOS PCT: 1 %
Eosinophils Absolute: 0.2 10*3/uL (ref 0.0–0.7)
LYMPHS PCT: 8 %
Lymphs Abs: 1.9 10*3/uL (ref 0.7–4.0)
MONOS PCT: 8 %
Monocytes Absolute: 1.9 10*3/uL — ABNORMAL HIGH (ref 0.1–1.0)
Neutro Abs: 19.8 10*3/uL — ABNORMAL HIGH (ref 1.7–7.7)
Neutrophils Relative %: 83 %

## 2018-06-19 LAB — CBC
HCT: 35.2 % — ABNORMAL LOW (ref 36.0–46.0)
Hemoglobin: 11.8 g/dL — ABNORMAL LOW (ref 12.0–15.0)
MCH: 28.5 pg (ref 26.0–34.0)
MCHC: 33.5 g/dL (ref 30.0–36.0)
MCV: 85 fL (ref 78.0–100.0)
Platelets: 222 10*3/uL (ref 150–400)
RBC: 4.14 MIL/uL (ref 3.87–5.11)
RDW: 14.4 % (ref 11.5–15.5)
WBC: 23.8 10*3/uL — ABNORMAL HIGH (ref 4.0–10.5)

## 2018-06-19 MED ORDER — PALONOSETRON HCL INJECTION 0.25 MG/5ML
INTRAVENOUS | Status: AC
  Filled 2018-06-19: qty 5

## 2018-06-19 MED ORDER — SODIUM CHLORIDE 0.9% FLUSH
10.0000 mL | INTRAVENOUS | Status: DC | PRN
  Administered 2018-06-19: 10 mL
  Filled 2018-06-19: qty 10

## 2018-06-19 MED ORDER — CYCLOPHOSPHAMIDE CHEMO INJECTION 1 GM
600.0000 mg/m2 | Freq: Once | INTRAMUSCULAR | Status: AC
  Administered 2018-06-19: 1100 mg via INTRAVENOUS
  Filled 2018-06-19: qty 50

## 2018-06-19 MED ORDER — HEPARIN SOD (PORK) LOCK FLUSH 100 UNIT/ML IV SOLN
500.0000 [IU] | Freq: Once | INTRAVENOUS | Status: AC | PRN
  Administered 2018-06-19: 500 [IU]

## 2018-06-19 MED ORDER — POTASSIUM CHLORIDE 10 MEQ/100ML IV SOLN
10.0000 meq | INTRAVENOUS | Status: AC
  Administered 2018-06-19 (×2): 10 meq via INTRAVENOUS
  Filled 2018-06-19 (×2): qty 100

## 2018-06-19 MED ORDER — PALONOSETRON HCL INJECTION 0.25 MG/5ML
0.2500 mg | Freq: Once | INTRAVENOUS | Status: AC
  Administered 2018-06-19: 0.25 mg via INTRAVENOUS

## 2018-06-19 MED ORDER — SODIUM CHLORIDE 0.9 % IV SOLN
Freq: Once | INTRAVENOUS | Status: AC
  Administered 2018-06-19: 14:00:00 via INTRAVENOUS
  Filled 2018-06-19: qty 5

## 2018-06-19 MED ORDER — DOXORUBICIN HCL CHEMO IV INJECTION 2 MG/ML
60.0000 mg/m2 | Freq: Once | INTRAVENOUS | Status: AC
  Administered 2018-06-19: 110 mg via INTRAVENOUS
  Filled 2018-06-19: qty 55

## 2018-06-19 MED ORDER — SODIUM CHLORIDE 0.9 % IV SOLN
Freq: Once | INTRAVENOUS | Status: AC
  Administered 2018-06-19: 11:00:00 via INTRAVENOUS

## 2018-06-19 NOTE — Patient Instructions (Signed)
[facility] Discharge Instructions for Patients Receiving Chemotherapy  Today you received the following chemotherapy agents adriamycin and cytoxan.    If you develop nausea and vomiting that is not controlled by your nausea medication, call the clinic.   BELOW ARE SYMPTOMS THAT SHOULD BE REPORTED IMMEDIATELY:  *FEVER GREATER THAN 100.5 F  *CHILLS WITH OR WITHOUT FEVER  NAUSEA AND VOMITING THAT IS NOT CONTROLLED WITH YOUR NAUSEA MEDICATION  *UNUSUAL SHORTNESS OF BREATH  *UNUSUAL BRUISING OR BLEEDING  TENDERNESS IN MOUTH AND THROAT WITH OR WITHOUT PRESENCE OF ULCERS  *URINARY PROBLEMS  *BOWEL PROBLEMS  UNUSUAL RASH Items with * indicate a potential emergency and should be followed up as soon as possible.  Feel free to call the clinic should you have any questions or concerns. The clinic phone number is [phone].  Please show the [place] at check-in to the Emergency Department and triage nurse.

## 2018-06-19 NOTE — Progress Notes (Signed)
Diagnosis No diagnosis found.  Staging Cancer Staging No matching staging information was found for the patient.  Assessment and Plan:  1.  Locally advanced (stage IIIa, T3N1) right breast cancer.  Pt is followed by Dr. Worthy Keeler.  Historical details obtained from note dated 06/05/2018.   Pt had mammogram on 03/28/2018 was BI-RADS Category 0.  Mammogram and ultrasound on 04/22/2018 shows 5.9 x 5 x 6 cm mass extending in the upper quadrant of the right breast and right axillary lymph node measuring 2.8 x 1.6 x 1.7 cm.  Left breast has retroareolar mass.  Patient did not have mammogram in the last 20 years.  Biopsy was done on 04/29/2018 of the right breast mass consistent with IDC, grade 2-3, lymph node biopsy positive for metastatic disease, ER/PR positive, HER-2 negative, Ki-67 of 5%  Pt had staging evaluation done with bone scan which showed increased uptake in the right greater trochanter and right sacroiliac joint.  CT scan of the chest abdomen and pelvis which did not show any evidence of metastatic disease.  The uptake in the right greater trochanter was thought to be due to enthesopathic change.  Uptake in the right sacroiliac joint was likely from degenerative changes at the right L5-S1 joint.  Due to advanced nature of her breast cancer, she is being treated with neoadjuvant chemotherapy with dose dense AC followed by weekly paclitaxel for 12 weeks.  She is here for evaluation prior to C2.   2D echocardiogram done 06/02/2018 showed ejection fraction of 65 to 70%.  Labs done 06/19/2018 reviewed with pt and showed WBC 23.8, HB 11.8 and plts 222,000.  She is afebrile.  WBC count elevation due to growth factor support.  She will proceed with chemotherapy with AC.  She will RTC 07/07/2018 for follow-up and repet labs prior to C3.    2.  Leucocytosis.  WBC elevated at 23.8.  Likely due to prior Growth factor support.  Pt is afebrile.  She will continue growth factor support due to [facility] and upcoming  holiday and  will RTC 07/07/2018 for follow-up and repeat labs prior to C3.    3.  Hypokalemia.  Potassium decreased at 2.9.  She is on oral potassium.  She will receive 20 meq IV potassium in clinic.  Continue oral supplementation.  Will repeat labs on RTC.    4.  HTN.  BP is 103/45.  Follow-up with PCP.    5.  Left breast DCIS.  Pt had biopsy on 04/29/2018 of the left breast shows DCIS, ER/PR positive.  Left axillary lymph node biopsy was negative for malignancy.   Current Status:  Pt is seen today for follow-up prior to C2 of AC.  She reports improvement in breast mass.      Breast cancer, right ([place])   05/12/2018 Initial Diagnosis    Breast cancer, right ([place])      06/02/2018 -  Chemotherapy    The patient had DOXOrubicin (ADRIAMYCIN) chemo injection 110 mg, 60 mg/m2 = 110 mg, Intravenous,  Once, 2 of 4 cycles Administration: 110 mg (06/05/2018), 110 mg (06/19/2018) palonosetron (ALOXI) injection 0.25 mg, 0.25 mg, Intravenous,  Once, 2 of 4 cycles Administration: 0.25 mg (06/05/2018), 0.25 mg (06/19/2018) pegfilgrastim-cbqv (UDENYCA) injection 6 mg, 6 mg, Subcutaneous, Once, 2 of 4 cycles Administration: 6 mg (06/06/2018) cyclophosphamide (CYTOXAN) 1,100 mg in sodium chloride 0.9 % 250 mL chemo infusion, 600 mg/m2 = 1,100 mg, Intravenous,  Once, 2 of 4 cycles Administration: 1,100 mg (06/05/2018), 1,100 mg (06/19/2018)  PACLitaxel (TAXOL) 144 mg in sodium chloride 0.9 % 250 mL chemo infusion (</= 18m/m2), 80 mg/m2, Intravenous,  Once, 0 of 12 cycles fosaprepitant (EMEND) 150 mg, dexamethasone (DECADRON) 12 mg in sodium chloride 0.9 % 145 mL IVPB, , Intravenous,  Once, 2 of 4 cycles Administration:  (06/05/2018),  (06/19/2018)  for chemotherapy treatment.         Problem List Patient Active Problem List   Diagnosis Date Noted  . Goals of care, counseling/discussion [Z71.89] 05/29/2018  . Breast cancer, right ([place] [C50.911] 05/12/2018  . 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 Allergic rhinitis [J30.9] 02/20/2015  . 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 Essential hypertension, benign [I10] 12/16/2013  . Type 2 diabetes mellitus ([place] [E11.9] 12/16/2013    Past Medical History Past Medical History:  Diagnosis Date  . Asthma   . Hypertension     Past Surgical History Past Surgical History:  Procedure Laterality Date  . ABDOMINAL HYSTERECTOMY    . PORTACATH PLACEMENT Right 06/04/2018   Procedure: INSERTION PORT-A-CATH;  Surgeon: JAviva Signs MD;  Location: [facility];  Service: General;  Laterality: Right;    Family History No family history on file.   Social History  reports that she has never smoked. She has never used smokeless tobacco. She reports that she does not drink alcohol or use drugs.  Medications  Current Outpatient Medications:  .  albuterol (PROAIR HFA) 108 (90 Base) MCG/ACT inhaler, INHALE TWO PUFFS INTO THE LUNGS EVERY SIX HOURS AS NEEDED FOR WHEEZING, Disp: 8.5 g, Rfl: 5 .  albuterol (PROVENTIL) (2.5 MG/3ML) 0.083% nebulizer solution, Use via neb q 4 hrs prn wheezing (Patient taking differently: Take 2.5 mg by nebulization every 4 (four) hours as needed for wheezing or shortness of breath. Use via neb q 4 hrs prn wheezing), Disp: 25 vial, Rfl: 2 .  amLODipine (NORVASC) 10 MG tablet, Take 1 tablet (10 mg total) by mouth daily., Disp: 30 tablet, Rfl: 4 .  aspirin 81 MG tablet, Take 81 mg by mouth daily., Disp: , Rfl:  .  Cholecalciferol (VITAMIN D-3) 1000 UNITS CAPS, Take 1 capsule by mouth daily. , Disp: , Rfl:  .  Flaxseed, Linseed, (FLAXSEED OIL) 1000 MG CAPS, Take 1 capsule by mouth daily. , Disp: , Rfl:  .  hydrochlorothiazide (HYDRODIURIL) 25 MG tablet, Take 1 tablet (25 mg total) by mouth daily., Disp: 30 tablet, Rfl: 5 .  lidocaine-prilocaine (EMLA) cream, Apply to affected area once, Disp: 30 g, Rfl: 3 .  metFORMIN (GLUCOPHAGE) 500 MG tablet, Take 1 tablet (500 mg total) by  mouth daily with breakfast., Disp: 30 tablet, Rfl: 5 .  montelukast (SINGULAIR) 10 MG tablet, TAKE ONE TABLET BY MOUTH EVERY NIGHT AT BEDTIME, Disp: 30 tablet, Rfl: 5 .  nabumetone (RELAFEN) 750 MG tablet, Take 1 tablet (750 mg total) by mouth 2 (two) times daily. With food (Patient taking differently: Take 750 mg by mouth 2 (two) times daily as needed for mild pain. With food), Disp: 28 tablet, Rfl: 5 .  potassium chloride SA (K-DUR,KLOR-CON) 20 MEQ tablet, Take 1 tablet (20 mEq total) by mouth daily., Disp: 30 tablet, Rfl: 2 .  prochlorperazine (COMPAZINE) 10 MG tablet, Take 1 tablet (10 mg total) by mouth every 6 (six) hours as needed (Nausea or vomiting)., Disp: 30 tablet, Rfl: 1 .  traMADol (ULTRAM) 50 MG tablet, Take 1 tablet (50 mg total) by mouth every 6 (six) hours as needed.,  Disp: 20 tablet, Rfl: 0 No current facility-administered medications for this visit.   Facility-Administered Medications Ordered in Other Visits:  .  sodium chloride flush (NS) 0.9 % injection 10 mL, 10 mL, Intracatheter, PRN, Derek Jack, MD, 10 mL at 06/19/18 1125  Allergies Penicillins; Percocet [oxycodone-acetaminophen]; and Ranitidine  Review of Systems Review of Systems - Oncology ROS is negative   Physical Exam  Vitals Wt Readings from Last 3 Encounters:  06/19/18 167 lb 8 oz (76 kg)  06/05/18 175 lb (79.4 kg)  05/29/18 173 lb 12.8 oz (78.8 kg)   Temp Readings from Last 3 Encounters:  06/19/18 98.6 F (37 C) (Oral)  06/06/18 98.4 F (36.9 C) (Oral)  06/05/18 98.1 F (36.7 C) (Oral)   BP Readings from Last 3 Encounters:  06/19/18 (!) 103/45  06/06/18 (!) 138/48  06/05/18 (!) 103/48   Pulse Readings from Last 3 Encounters:  06/19/18 60  06/06/18 77  06/05/18 79   Constitutional: Well-developed, well-nourished, and in no distress.   HENT: Head: Normocephalic and atraumatic.  Mouth/Throat: No oropharyngeal exudate. Mucosa moist. Eyes: Pupils are equal, round, and reactive to  light. Conjunctivae are normal. No scleral icterus.  Neck: Normal range of motion. Neck supple. No JVD present.  Cardiovascular: Normal rate, regular rhythm and normal heart sounds.  Exam reveals no gallop and no friction rub.   No murmur heard. Pulmonary/Chest: Effort normal and breath sounds normal. No respiratory distress. No wheezes.No rales.  Abdominal: Soft. Bowel sounds are normal. No distension. There is no tenderness. There is no guarding.  Musculoskeletal: No edema or tenderness.  Lymphadenopathy: No cervical, axillary or supraclavicular adenopathy.  Neurological: Alert and oriented to person, place, and time. No cranial nerve deficit.  Skin: Skin is warm and dry. No rash noted. No erythema. No pallor.  Psychiatric: Affect and judgment normal.  Bilateral breast exam:  Chaperone present.  Right breast has palpable mass that is improved from 6 cm and is now 4-5 cm.  Pt reports Right nipple retraction is improving. Left breast has a mass in the center.     Labs Appointment on 06/19/2018  Component Date Value Ref Range Status  . WBC 06/19/2018 23.8* 4.0 - 10.5 K/uL Final  . RBC 06/19/2018 4.14  3.87 - 5.11 MIL/uL Final  . Hemoglobin 06/19/2018 11.8* 12.0 - 15.0 g/dL Final  . HCT 06/19/2018 35.2* 36.0 - 46.0 % Final  . MCV 06/19/2018 85.0  78.0 - 100.0 fL Final  . MCH 06/19/2018 28.5  26.0 - 34.0 pg Final  . MCHC 06/19/2018 33.5  30.0 - 36.0 g/dL Final  . RDW 06/19/2018 14.4  11.5 - 15.5 % Final  . Platelets 06/19/2018 222  150 - 400 K/uL Final   Performed at [facility], [address] [place], [place] [address]  . Sodium 06/19/2018 136  135 - 145 mmol/L Final  . Potassium 06/19/2018 2.9* 3.5 - 5.1 mmol/L Final  . Chloride 06/19/2018 97* 101 - 111 mmol/L Final  . CO2 06/19/2018 27  22 - 32 mmol/L Final  . Glucose, Bld 06/19/2018 119* 65 - 99 mg/dL Final  . BUN 06/19/2018 7  6 - 20 mg/dL Final  . Creatinine, Ser 06/19/2018 0.88  0.44 - 1.00 mg/dL Final  . Calcium 06/19/2018  9.3  8.9 - 10.3 mg/dL Final  . Total Protein 06/19/2018 7.7  6.5 - 8.1 g/dL Final  . Albumin 06/19/2018 3.8  3.5 - 5.0 g/dL Final  . AST 06/19/2018 24  15 - 41 U/L  Final  . ALT 06/19/2018 18  14 - 54 U/L Final  . Alkaline Phosphatase 06/19/2018 98  38 - 126 U/L Final  . Total Bilirubin 06/19/2018 0.5  0.3 - 1.2 mg/dL Final  . GFR calc non Af Amer 06/19/2018 >60  >60 mL/min Final  . GFR calc Af Amer 06/19/2018 >60  >60 mL/min Final   Comment: (NOTE) The eGFR has been calculated using the CKD EPI equation. This calculation has not been validated in all clinical situations. eGFR's persistently <60 mL/min signify possible Chronic Kidney Disease.   Georgiann Hahn gap 06/19/2018 12  5 - 15 Final   Performed at [facility], [address], [place], [place] [address]  . Neutrophils Relative % 06/19/2018 83  % Final  . Lymphocytes Relative 06/19/2018 8  % Final  . Monocytes Relative 06/19/2018 8  % Final  . Eosinophils Relative 06/19/2018 1  % Final  . Basophils Relative 06/19/2018 0  % Final  . Neutro Abs 06/19/2018 19.8* 1.7 - 7.7 K/uL Final  . Lymphs Abs 06/19/2018 1.9  0.7 - 4.0 K/uL Final  . Monocytes Absolute 06/19/2018 1.9* 0.1 - 1.0 K/uL Final  . Eosinophils Absolute 06/19/2018 0.2  0.0 - 0.7 K/uL Final  . Basophils Absolute 06/19/2018 0.0  0.0 - 0.1 K/uL Final  . WBC Morphology 06/19/2018 MILD LEFT SHIFT (1-5% METAS, OCC MYELO, OCC BANDS)   Final   Comment: VACUOLATED NEUTROPHILS DOHLE BODIES Performed at [facility], [address], [place], [place] [address]      Pathology No orders of the defined types were placed in this encounter.      Zoila Shutter MD

## 2018-06-19 NOTE — Progress Notes (Signed)
Patient to treatment room for chemotherapy after oncology follow up visit.  No complaints voiced.  Family at side.   Patient tolerated chemotherapy with no complaints voiced.  Good good blood return noted before and after administration of chemotherapy.  Blood return noted per protocol with adriamycin push.  Port site clean and dry with no bruising or swelling noted at site.  No complaints of pain with flush.  Band aid applied.  VSS with discharge and left ambulatory with family.  No s/s of distress noted.

## 2018-06-20 ENCOUNTER — Inpatient Hospital Stay (HOSPITAL_COMMUNITY): Payer: Medicare Other

## 2018-06-20 ENCOUNTER — Other Ambulatory Visit (HOSPITAL_COMMUNITY): Payer: Self-pay | Admitting: Pharmacist

## 2018-06-20 ENCOUNTER — Other Ambulatory Visit: Payer: Self-pay

## 2018-06-20 VITALS — BP 112/61 | HR 74 | Temp 97.6°F | Resp 16

## 2018-06-20 DIAGNOSIS — Z170 Estrogen receptor positive status [ER+]: Principal | ICD-10-CM

## 2018-06-20 DIAGNOSIS — C773 Secondary and unspecified malignant neoplasm of axilla and upper limb lymph nodes: Secondary | ICD-10-CM | POA: Diagnosis not present

## 2018-06-20 DIAGNOSIS — E876 Hypokalemia: Secondary | ICD-10-CM | POA: Diagnosis not present

## 2018-06-20 DIAGNOSIS — D72829 Elevated white blood cell count, unspecified: Secondary | ICD-10-CM | POA: Diagnosis not present

## 2018-06-20 DIAGNOSIS — C50811 Malignant neoplasm of overlapping sites of right female breast: Secondary | ICD-10-CM

## 2018-06-20 DIAGNOSIS — Z5111 Encounter for antineoplastic chemotherapy: Secondary | ICD-10-CM | POA: Diagnosis not present

## 2018-06-20 DIAGNOSIS — Z5189 Encounter for other specified aftercare: Secondary | ICD-10-CM | POA: Diagnosis not present

## 2018-06-20 DIAGNOSIS — C50411 Malignant neoplasm of upper-outer quadrant of right female breast: Secondary | ICD-10-CM | POA: Diagnosis not present

## 2018-06-20 MED ORDER — PEGFILGRASTIM-CBQV 6 MG/0.6ML ~~LOC~~ SOSY
PREFILLED_SYRINGE | SUBCUTANEOUS | Status: AC
  Filled 2018-06-20: qty 0.6

## 2018-06-20 MED ORDER — PEGFILGRASTIM-CBQV 6 MG/0.6ML ~~LOC~~ SOSY
6.0000 mg | PREFILLED_SYRINGE | Freq: Once | SUBCUTANEOUS | Status: AC
  Administered 2018-06-20: 6 mg via SUBCUTANEOUS

## 2018-06-20 NOTE — Progress Notes (Signed)
Pt here today for Udenyca injection. Pt given injection in left lower abdomen. Pt tolerated injection well. Pt stable and discharged ambulatory with friend. Pt to return as scheduled.

## 2018-07-07 ENCOUNTER — Inpatient Hospital Stay (HOSPITAL_COMMUNITY): Payer: Medicare Other

## 2018-07-07 ENCOUNTER — Other Ambulatory Visit: Payer: Self-pay

## 2018-07-07 ENCOUNTER — Inpatient Hospital Stay (HOSPITAL_COMMUNITY): Payer: Medicare Other | Attending: Hematology

## 2018-07-07 ENCOUNTER — Encounter (HOSPITAL_COMMUNITY): Payer: Self-pay | Admitting: Hematology

## 2018-07-07 ENCOUNTER — Inpatient Hospital Stay (HOSPITAL_BASED_OUTPATIENT_CLINIC_OR_DEPARTMENT_OTHER): Payer: Medicare Other | Admitting: Hematology

## 2018-07-07 VITALS — BP 107/56 | HR 69 | Temp 98.3°F | Resp 18

## 2018-07-07 VITALS — BP 135/92 | HR 62 | Temp 98.4°F | Resp 18 | Wt 164.1 lb

## 2018-07-07 DIAGNOSIS — D0512 Intraductal carcinoma in situ of left breast: Secondary | ICD-10-CM | POA: Insufficient documentation

## 2018-07-07 DIAGNOSIS — R634 Abnormal weight loss: Secondary | ICD-10-CM | POA: Insufficient documentation

## 2018-07-07 DIAGNOSIS — C773 Secondary and unspecified malignant neoplasm of axilla and upper limb lymph nodes: Secondary | ICD-10-CM | POA: Diagnosis not present

## 2018-07-07 DIAGNOSIS — Z170 Estrogen receptor positive status [ER+]: Principal | ICD-10-CM

## 2018-07-07 DIAGNOSIS — Z5111 Encounter for antineoplastic chemotherapy: Secondary | ICD-10-CM | POA: Insufficient documentation

## 2018-07-07 DIAGNOSIS — E876 Hypokalemia: Secondary | ICD-10-CM

## 2018-07-07 DIAGNOSIS — C50811 Malignant neoplasm of overlapping sites of right female breast: Secondary | ICD-10-CM

## 2018-07-07 DIAGNOSIS — Z5189 Encounter for other specified aftercare: Secondary | ICD-10-CM | POA: Insufficient documentation

## 2018-07-07 DIAGNOSIS — C50411 Malignant neoplasm of upper-outer quadrant of right female breast: Secondary | ICD-10-CM | POA: Diagnosis not present

## 2018-07-07 DIAGNOSIS — R630 Anorexia: Secondary | ICD-10-CM | POA: Insufficient documentation

## 2018-07-07 DIAGNOSIS — R110 Nausea: Secondary | ICD-10-CM | POA: Insufficient documentation

## 2018-07-07 DIAGNOSIS — L659 Nonscarring hair loss, unspecified: Secondary | ICD-10-CM | POA: Diagnosis not present

## 2018-07-07 DIAGNOSIS — R5383 Other fatigue: Secondary | ICD-10-CM

## 2018-07-07 LAB — CBC WITH DIFFERENTIAL/PLATELET
Basophils Absolute: 0 10*3/uL (ref 0.0–0.1)
Basophils Relative: 0 %
Eosinophils Absolute: 0.3 10*3/uL (ref 0.0–0.7)
Eosinophils Relative: 2 %
HEMATOCRIT: 32.6 % — AB (ref 36.0–46.0)
Hemoglobin: 10.7 g/dL — ABNORMAL LOW (ref 12.0–15.0)
LYMPHS ABS: 1 10*3/uL (ref 0.7–4.0)
Lymphocytes Relative: 7 %
MCH: 28.1 pg (ref 26.0–34.0)
MCHC: 32.8 g/dL (ref 30.0–36.0)
MCV: 85.6 fL (ref 78.0–100.0)
MONOS PCT: 12 %
Monocytes Absolute: 1.8 10*3/uL — ABNORMAL HIGH (ref 0.1–1.0)
NEUTROS PCT: 79 %
Neutro Abs: 11.6 10*3/uL — ABNORMAL HIGH (ref 1.7–7.7)
PLATELETS: 283 10*3/uL (ref 150–400)
RBC: 3.81 MIL/uL — AB (ref 3.87–5.11)
RDW: 15.6 % — AB (ref 11.5–15.5)
WBC: 14.7 10*3/uL — AB (ref 4.0–10.5)

## 2018-07-07 LAB — COMPREHENSIVE METABOLIC PANEL
ALBUMIN: 3.7 g/dL (ref 3.5–5.0)
ALT: 20 U/L (ref 0–44)
AST: 22 U/L (ref 15–41)
Alkaline Phosphatase: 71 U/L (ref 38–126)
Anion gap: 10 (ref 5–15)
BILIRUBIN TOTAL: 0.4 mg/dL (ref 0.3–1.2)
BUN: 14 mg/dL (ref 8–23)
CHLORIDE: 99 mmol/L (ref 98–111)
CO2: 29 mmol/L (ref 22–32)
Calcium: 9.6 mg/dL (ref 8.9–10.3)
Creatinine, Ser: 0.9 mg/dL (ref 0.44–1.00)
GFR calc Af Amer: >60 mL/min (ref >60)
GFR calc non Af Amer: >60 mL/min (ref >60)
GLUCOSE: 123 mg/dL — AB (ref 70–99)
POTASSIUM: 3.2 mmol/L — AB (ref 3.5–5.1)
Sodium: 138 mmol/L (ref 135–145)
Total Protein: 7.3 g/dL (ref 6.5–8.1)

## 2018-07-07 MED ORDER — HEPARIN SOD (PORK) LOCK FLUSH 100 UNIT/ML IV SOLN
500.0000 [IU] | Freq: Once | INTRAVENOUS | Status: AC | PRN
  Administered 2018-07-07: 500 [IU]

## 2018-07-07 MED ORDER — PALONOSETRON HCL INJECTION 0.25 MG/5ML
INTRAVENOUS | Status: AC
  Filled 2018-07-07: qty 5

## 2018-07-07 MED ORDER — PALONOSETRON HCL INJECTION 0.25 MG/5ML
0.2500 mg | Freq: Once | INTRAVENOUS | Status: AC
Start: 2018-07-07 — End: 2018-07-07
  Administered 2018-07-07: 0.25 mg via INTRAVENOUS

## 2018-07-07 MED ORDER — SODIUM CHLORIDE 0.9 % IV SOLN
Freq: Once | INTRAVENOUS | Status: AC
  Administered 2018-07-07: 10:00:00 via INTRAVENOUS

## 2018-07-07 MED ORDER — POTASSIUM CHLORIDE CRYS ER 20 MEQ PO TBCR
20.0000 meq | EXTENDED_RELEASE_TABLET | Freq: Once | ORAL | Status: AC
  Administered 2018-07-07: 20 meq via ORAL
  Filled 2018-07-07: qty 1

## 2018-07-07 MED ORDER — DOXORUBICIN HCL CHEMO IV INJECTION 2 MG/ML
60.0000 mg/m2 | Freq: Once | INTRAVENOUS | Status: AC
  Administered 2018-07-07: 110 mg via INTRAVENOUS
  Filled 2018-07-07: qty 55

## 2018-07-07 MED ORDER — POTASSIUM CHLORIDE CRYS ER 20 MEQ PO TBCR: 20.0000 meq | EXTENDED_RELEASE_TABLET | Freq: Two times a day (BID) | ORAL | 2 refills | Status: DC

## 2018-07-07 MED ORDER — FOSAPREPITANT DIMEGLUMINE INJECTION 150 MG
Freq: Once | INTRAVENOUS | Status: AC
  Administered 2018-07-07: 11:00:00 via INTRAVENOUS
  Filled 2018-07-07: qty 5

## 2018-07-07 MED ORDER — SODIUM CHLORIDE 0.9% FLUSH
10.0000 mL | INTRAVENOUS | Status: DC | PRN
  Administered 2018-07-07: 10 mL
  Filled 2018-07-07: qty 10

## 2018-07-07 MED ORDER — SODIUM CHLORIDE 0.9 % IV SOLN
600.0000 mg/m2 | Freq: Once | INTRAVENOUS | Status: AC
  Administered 2018-07-07: 1100 mg via INTRAVENOUS
  Filled 2018-07-07: qty 50

## 2018-07-07 NOTE — Assessment & Plan Note (Signed)
1.  Locally advanced (stage IIIa, T3N1) right breast cancer: - Mammogram on 03/28/2018 was BI-RADS Category 0.  Mammogram and ultrasound on 04/22/2018 shows 5.9 x 5 x 6 cm mass extending in the upper quadrant of the right breast and right axillary lymph node measuring 2.8 x 1.6 x 1.7 cm.  Left breast has retroareolar mass.  Patient did not have mammogram in the last 20 years. - Biopsy on 04/29/2018 of the right breast mass consistent with IDC, grade 2-3, lymph node biopsy positive for metastatic disease, ER/PR positive, HER-2 negative, Ki-67 of 5% -Seen by Dr. Arnoldo Morale who recommended right modified radical mastectomy and left simple mastectomy. - Bone scan showed  increased uptake in the right greater trochanter and right sacroiliac joint.  CT scan of the chest abdomen and pelvis did not show any evidence of metastatic disease.  The uptake in the right greater trochanter was thought to be due to enthesopathic change.  Uptake in the right sacroiliac joint was likely from degenerative changes at the right L5-S1 joint. - Due to advanced nature of her breast cancer, we have recommended neoadjuvant chemotherapy with dose dense AC followed by weekly paclitaxel for 12 weeks. - Cycle 1 of dose dense AC on 06/05/2018, cycle 2 on 06/19/2018.  She is tolerating it very well.  Today her blood counts are adequate to proceed with cycle 3 without any dose modifications.  We will see her back in 2 weeks for follow-up and next cycle.  2.  Left breast DCIS: -Biopsy on 04/29/2018 of the left breast shows DCIS, ER/PR positive.  Left axillary lymph node biopsy was negative for malignancy.  3.  Hypokalemia: -Her potassium today is 3.2.  She is continuing potassium 20 mg daily.  I will increase potassium to twice daily.

## 2018-07-07 NOTE — Progress Notes (Signed)
1010 labs reviewed and patient seen by Dr. Delton Coombes who approved patient for chemotherapy treatment today.  Whitney Velez tolerated treatment today with no incident or complaints. Positive blood return noted before, during, and after Adriamycin administration. VSS upon completion of treatment. Discharged self ambulatory in satisfactory condition accompanied by daughter in law.

## 2018-07-07 NOTE — Progress Notes (Signed)
Whitney Velez, [place] [address]   CLINIC:  Medical Oncology/Hematology  PCP:  Mikey Kirschner, MD [place] [address] [phone]   REASON FOR VISIT:  Follow-up for right breast cancer  CURRENT THERAPY: Dose dense AC  BRIEF ONCOLOGIC HISTORY:    Breast cancer, right ([place])   05/12/2018 Initial Diagnosis    Breast cancer, right ([place])      06/02/2018 -  Chemotherapy    The patient had DOXOrubicin (ADRIAMYCIN) chemo injection 110 mg, 60 mg/m2 = 110 mg, Intravenous,  Once, 3 of 4 cycles Administration: 110 mg (06/05/2018), 110 mg (06/19/2018), 110 mg (07/07/2018) palonosetron (ALOXI) injection 0.25 mg, 0.25 mg, Intravenous,  Once, 3 of 4 cycles Administration: 0.25 mg (06/05/2018), 0.25 mg (06/19/2018), 0.25 mg (07/07/2018) pegfilgrastim-cbqv (UDENYCA) injection 6 mg, 6 mg, Subcutaneous, Once, 3 of 4 cycles Administration: 6 mg (06/06/2018), 6 mg (06/20/2018) cyclophosphamide (CYTOXAN) 1,100 mg in sodium chloride 0.9 % 250 mL chemo infusion, 600 mg/m2 = 1,100 mg, Intravenous,  Once, 3 of 4 cycles Administration: 1,100 mg (06/05/2018), 1,100 mg (06/19/2018), 1,100 mg (07/07/2018) PACLitaxel (TAXOL) 144 mg in sodium chloride 0.9 % 250 mL chemo infusion (</= 102m/m2), 80 mg/m2, Intravenous,  Once, 0 of 12 cycles fosaprepitant (EMEND) 150 mg, dexamethasone (DECADRON) 12 mg in sodium chloride 0.9 % 145 mL IVPB, , Intravenous,  Once, 3 of 4 cycles Administration:  (06/05/2018),  (06/19/2018),  (07/07/2018)  for chemotherapy treatment.         CANCER STAGING: Cancer Staging No matching staging information was found for the patient.   INTERVAL HISTORY:  [name]. female returns for routine follow-up of breast cancer and consideration for 3rd cycle of chemotherapy. Patient states that she is still fatigued but it is improving and much better than it was. Patient started loosing her hair this past week. Denies nausea, vomiting, or  diarrhea. Denies chest pain or any new pains. Overall, she tells me she has been feeling pretty well. Energy levels 75%; appetite 50%. She feels ready for next cycle of chemo today.     REVIEW OF SYSTEMS:  Review of Systems  Constitutional: Positive for fatigue.  All other systems reviewed and are negative.    PAST MEDICAL/SURGICAL HISTORY:  Past Medical History:  Diagnosis Date  . Asthma   . Hypertension    Past Surgical History:  Procedure Laterality Date  . ABDOMINAL HYSTERECTOMY    . PORTACATH PLACEMENT Right 06/04/2018   Procedure: INSERTION PORT-A-CATH;  Surgeon: JAviva Signs MD;  Location: [facility];  Service: General;  Laterality: Right;     SOCIAL HISTORY:  Social History   Socioeconomic History  . Marital status: Widowed    Spouse name: Not on file  . Number of children: Not on file  . Years of education: Not on file  . Highest education level: Not on file  Occupational History  . Not on file  Social Needs  . Financial resource strain: Not on file  . Food insecurity:    Worry: Not on file    Inability: Not on file  . Transportation needs:    Medical: Not on file    Non-medical: Not on file  Tobacco Use  . Smoking status: Never Smoker  . Smokeless tobacco: Never Used  Substance and Sexual Activity  . Alcohol use: Never    Frequency: Never  . Drug use: Never  . Sexual activity: Not on file  Lifestyle  . Physical  activity:    Days per week: Not on file    Minutes per session: Not on file  . Stress: Not on file  Relationships  . Social connections:    Talks on phone: Not on file    Gets together: Not on file    Attends religious service: Not on file    Active member of club or organization: Not on file    Attends meetings of clubs or organizations: Not on file    Relationship status: Not on file  . Intimate partner violence:    Fear of current or ex partner: Not on file    Emotionally abused: Not on file    Physically abused: Not on file     Forced sexual activity: Not on file  Other Topics Concern  . Not on file  Social History Narrative  . Not on file    FAMILY HISTORY:  History reviewed. No pertinent family history.  CURRENT MEDICATIONS:  Outpatient Encounter Medications as of 07/07/2018  Medication Sig  . albuterol (PROAIR HFA) 108 (90 Base) MCG/ACT inhaler INHALE TWO PUFFS INTO THE LUNGS EVERY SIX HOURS AS NEEDED FOR WHEEZING  . albuterol (PROVENTIL) (2.5 MG/3ML) 0.083% nebulizer solution Use via neb q 4 hrs prn wheezing (Patient taking differently: Take 2.5 mg by nebulization every 4 (four) hours as needed for wheezing or shortness of breath. Use via neb q 4 hrs prn wheezing)  . amLODipine (NORVASC) 10 MG tablet Take 1 tablet (10 mg total) by mouth daily.  Marland Kitchen aspirin 81 MG tablet Take 81 mg by mouth daily.  . Cholecalciferol (VITAMIN D-3) 1000 UNITS CAPS Take 1 capsule by mouth daily.   . Flaxseed, Linseed, (FLAXSEED OIL) 1000 MG CAPS Take 1 capsule by mouth daily.   . hydrochlorothiazide (HYDRODIURIL) 25 MG tablet Take 1 tablet (25 mg total) by mouth daily.  Marland Kitchen lidocaine-prilocaine (EMLA) cream Apply to affected area once  . metFORMIN (GLUCOPHAGE) 500 MG tablet Take 1 tablet (500 mg total) by mouth daily with breakfast.  . montelukast (SINGULAIR) 10 MG tablet TAKE ONE TABLET BY MOUTH EVERY NIGHT AT BEDTIME  . nabumetone (RELAFEN) 750 MG tablet Take 1 tablet (750 mg total) by mouth 2 (two) times daily. With food (Patient taking differently: Take 750 mg by mouth 2 (two) times daily as needed for mild pain. With food)  . potassium chloride SA (K-DUR,KLOR-CON) 20 MEQ tablet Take 1 tablet (20 mEq total) by mouth 2 (two) times daily.  . prochlorperazine (COMPAZINE) 10 MG tablet Take 1 tablet (10 mg total) by mouth every 6 (six) hours as needed (Nausea or vomiting).  . traMADol (ULTRAM) 50 MG tablet Take 1 tablet (50 mg total) by mouth every 6 (six) hours as needed.  . [DISCONTINUED] potassium chloride SA (K-DUR,KLOR-CON) 20 MEQ  tablet Take 1 tablet (20 mEq total) by mouth daily.   No facility-administered encounter medications on file as of 07/07/2018.     ALLERGIES:  Allergies  Allergen Reactions  . Penicillins Other (See Comments)    Nausea, vomiting, dizziness Has patient had a PCN reaction causing immediate rash, facial/tongue/throat swelling, SOB or lightheadedness with hypotension: no Has patient had a PCN reaction causing severe rash involving mucus membranes or skin necrosis: no Has patient had a PCN reaction that required hospitalization: no Has patient had a PCN reaction occurring within the last 10 years: no If all of the above answers are "NO", then may proceed with Cephalosporin use.   Marland Kitchen Percocet [Oxycodone-Acetaminophen]  Nausea, vomiting, diarrhea  . Ranitidine Itching     PHYSICAL EXAM:  ECOG Performance status: 1  Vitals:   07/07/18 0905  BP: (!) 135/92  Pulse: 62  Resp: 18  Temp: 98.4 F (36.9 C)  SpO2: 100%   Filed Weights   07/07/18 0905  Weight: 164 lb 1.6 oz (74.4 kg)    Physical Exam   LABORATORY DATA:  I have reviewed the labs as listed.  CBC    Component Value Date/Time   WBC 14.7 (H) 07/07/2018 0819   RBC 3.81 (L) 07/07/2018 0819   HGB 10.7 (L) 07/07/2018 0819   HGB 12.4 02/07/2017 1109   HCT 32.6 (L) 07/07/2018 0819   HCT 38.2 02/07/2017 1109   PLT 283 07/07/2018 0819   PLT 335 02/07/2017 1109   MCV 85.6 07/07/2018 0819   MCV 86 02/07/2017 1109   MCH 28.1 07/07/2018 0819   MCHC 32.8 07/07/2018 0819   RDW 15.6 (H) 07/07/2018 0819   RDW 15.1 02/07/2017 1109   LYMPHSABS 1.0 07/07/2018 0819   LYMPHSABS 3.0 02/07/2017 1109   MONOABS 1.8 (H) 07/07/2018 0819   EOSABS 0.3 07/07/2018 0819   EOSABS 1.1 (H) 02/07/2017 1109   BASOSABS 0.0 07/07/2018 0819   BASOSABS 0.0 02/07/2017 1109   CMP Latest Ref Rng & Units 07/07/2018 06/19/2018 06/04/2018  Glucose 70 - 99 mg/dL 123(H) 119(H) 124(H)  BUN 8 - 23 mg/dL '14 7 12  ' Creatinine 0.44 - 1.00 mg/dL 0.90 0.88 0.95   Sodium 135 - 145 mmol/L 138 136 139  Potassium 3.5 - 5.1 mmol/L 3.2(L) 2.9(L) 3.0(L)  Chloride 98 - 111 mmol/L 99 97(L) 97(L)  CO2 22 - 32 mmol/L '29 27 28  ' Calcium 8.9 - 10.3 mg/dL 9.6 9.3 10.3  Total Protein 6.5 - 8.1 g/dL 7.3 7.7 8.4(H)  Total Bilirubin 0.3 - 1.2 mg/dL 0.4 0.5 0.7  Alkaline Phos 38 - 126 U/L 71 98 77  AST 15 - 41 U/L '22 24 30  ' ALT 0 - 44 U/L '20 18 25        ' ASSESSMENT & PLAN:   Breast cancer, right ([place]) 1.  Locally advanced (stage IIIa, T3N1) right breast cancer: - Mammogram on 03/28/2018 was BI-RADS Category 0.  Mammogram and ultrasound on 04/22/2018 shows 5.9 x 5 x 6 cm mass extending in the upper quadrant of the right breast and right axillary lymph node measuring 2.8 x 1.6 x 1.7 cm.  Left breast has retroareolar mass.  Patient did not have mammogram in the last 20 years. - Biopsy on 04/29/2018 of the right breast mass consistent with IDC, grade 2-3, lymph node biopsy positive for metastatic disease, ER/PR positive, HER-2 negative, Ki-67 of 5% -Seen by Dr. Arnoldo Morale who recommended right modified radical mastectomy and left simple mastectomy. - Bone scan showed  increased uptake in the right greater trochanter and right sacroiliac joint.  CT scan of the chest abdomen and pelvis did not show any evidence of metastatic disease.  The uptake in the right greater trochanter was thought to be due to enthesopathic change.  Uptake in the right sacroiliac joint was likely from degenerative changes at the right L5-S1 joint. - Due to advanced nature of her breast cancer, we have recommended neoadjuvant chemotherapy with dose dense AC followed by weekly paclitaxel for 12 weeks. - Cycle 1 of dose dense AC on 06/05/2018, cycle 2 on 06/19/2018.  She is tolerating it very well.  Today her blood counts are adequate to proceed with cycle 3  without any dose modifications.  We will see her back in 2 weeks for follow-up and next cycle.  2.  Left breast DCIS: -Biopsy on 04/29/2018 of the left  breast shows DCIS, ER/PR positive.  Left axillary lymph node biopsy was negative for malignancy.  3.  Hypokalemia: -Her potassium today is 3.2.  She is continuing potassium 20 mg daily.  I will increase potassium to twice daily.      Orders placed this encounter:  Orders Placed This Encounter  Procedures  . CBC with Differential/Platelet  . Comprehensive metabolic panel      Derek Jack, MD [place] [phone]

## 2018-07-07 NOTE — Patient Instructions (Signed)
[place] at [facility]  Discharge Instructions:  You received your Adriamycin and Cytoxan infusions today. Call the clinic for any questions or concerns.  _______________________________________________________________  Thank you for choosing [place] at [facility] to provide your oncology and hematology care.  To afford each patient quality time with our providers, please arrive at least 15 minutes before your scheduled appointment.  You need to re-schedule your appointment if you arrive 10 or more minutes late.  We strive to give you quality time with our providers, and arriving late affects you and other patients whose appointments are after yours.  Also, if you no show three or more times for appointments you may be dismissed from the clinic.  Again, thank you for choosing [place] at [facility] hope is that these requests will allow you access to exceptional care and in a timely manner. _______________________________________________________________  If you have questions after your visit, please contact our office at [phone] between the hours of 8:30 a.m. and 5:00 p.m. Voicemails left after 4:30 p.m. will not be returned until the following business day. _______________________________________________________________  For prescription refill requests, have your pharmacy contact our office. _______________________________________________________________  Recommendations made by the consultant and any test results will be sent to your referring physician. _______________________________________________________________

## 2018-07-08 ENCOUNTER — Inpatient Hospital Stay (HOSPITAL_COMMUNITY): Payer: Medicare Other

## 2018-07-08 ENCOUNTER — Encounter (HOSPITAL_COMMUNITY): Payer: Self-pay

## 2018-07-08 VITALS — BP 107/51 | HR 61 | Temp 97.6°F | Resp 18

## 2018-07-08 DIAGNOSIS — D0512 Intraductal carcinoma in situ of left breast: Secondary | ICD-10-CM | POA: Diagnosis not present

## 2018-07-08 DIAGNOSIS — C50811 Malignant neoplasm of overlapping sites of right female breast: Secondary | ICD-10-CM

## 2018-07-08 DIAGNOSIS — Z170 Estrogen receptor positive status [ER+]: Principal | ICD-10-CM

## 2018-07-08 DIAGNOSIS — R110 Nausea: Secondary | ICD-10-CM | POA: Diagnosis not present

## 2018-07-08 DIAGNOSIS — Z5111 Encounter for antineoplastic chemotherapy: Secondary | ICD-10-CM | POA: Diagnosis not present

## 2018-07-08 DIAGNOSIS — E876 Hypokalemia: Secondary | ICD-10-CM | POA: Diagnosis not present

## 2018-07-08 DIAGNOSIS — C773 Secondary and unspecified malignant neoplasm of axilla and upper limb lymph nodes: Secondary | ICD-10-CM | POA: Diagnosis not present

## 2018-07-08 MED ORDER — PEGFILGRASTIM-CBQV 6 MG/0.6ML ~~LOC~~ SOSY
PREFILLED_SYRINGE | SUBCUTANEOUS | Status: AC
  Filled 2018-07-08: qty 0.6

## 2018-07-08 MED ORDER — PEGFILGRASTIM-CBQV 6 MG/0.6ML ~~LOC~~ SOSY
6.0000 mg | PREFILLED_SYRINGE | Freq: Once | SUBCUTANEOUS | Status: AC
  Administered 2018-07-08: 6 mg via SUBCUTANEOUS

## 2018-07-08 NOTE — Progress Notes (Signed)
To treatment room for udenyca.  Patient tolerated injection with no complaints voiced.  Site clean and dry with no bruising or swelling noted at site.  Band aid applied.  VSS with discharge and left ambulatory with no s/s of distress noted.

## 2018-07-08 NOTE — Patient Instructions (Signed)
[facility] at [facility]  Discharge Instructions:  You received a udenyca shot today.  _______________________________________________________________  Thank you for choosing [facility] at [facility] to provide your oncology and hematology care.  To afford each patient quality time with our providers, please arrive at least 15 minutes before your scheduled appointment.  You need to re-schedule your appointment if you arrive 10 or more minutes late.  We strive to give you quality time with our providers, and arriving late affects you and other patients whose appointments are after yours.  Also, if you no show three or more times for appointments you may be dismissed from the clinic.  Again, thank you for choosing [facility] at [facility]. Our hope is that these requests will allow you access to exceptional care and in a timely manner. _______________________________________________________________  If you have questions after your visit, please contact our office at [phone] between the hours of 8:30 a.m. and 5:00 p.m. Voicemails left after 4:30 p.m. will not be returned until the following business day. _______________________________________________________________  For prescription refill requests, have your pharmacy contact our office. _______________________________________________________________  Recommendations made by the consultant and any test results will be sent to your referring physician. _______________________________________________________________ 

## 2018-07-10 MED FILL — POTASSIUM CL ER 20 MEQ TABL: 20 | 30 days supply | Qty: 60 | Fill #0

## 2018-07-21 ENCOUNTER — Encounter (HOSPITAL_COMMUNITY): Payer: Self-pay | Admitting: Hematology

## 2018-07-21 ENCOUNTER — Inpatient Hospital Stay (HOSPITAL_COMMUNITY): Payer: Medicare Other

## 2018-07-21 ENCOUNTER — Other Ambulatory Visit: Payer: Self-pay

## 2018-07-21 ENCOUNTER — Inpatient Hospital Stay (HOSPITAL_BASED_OUTPATIENT_CLINIC_OR_DEPARTMENT_OTHER): Payer: Medicare Other | Admitting: Hematology

## 2018-07-21 VITALS — BP 99/54 | HR 65 | Temp 97.4°F | Resp 18 | Wt 158.2 lb

## 2018-07-21 DIAGNOSIS — D0512 Intraductal carcinoma in situ of left breast: Secondary | ICD-10-CM

## 2018-07-21 DIAGNOSIS — Z5111 Encounter for antineoplastic chemotherapy: Secondary | ICD-10-CM | POA: Diagnosis not present

## 2018-07-21 DIAGNOSIS — E876 Hypokalemia: Secondary | ICD-10-CM | POA: Diagnosis not present

## 2018-07-21 DIAGNOSIS — R634 Abnormal weight loss: Secondary | ICD-10-CM | POA: Diagnosis not present

## 2018-07-21 DIAGNOSIS — Z170 Estrogen receptor positive status [ER+]: Principal | ICD-10-CM

## 2018-07-21 DIAGNOSIS — R110 Nausea: Secondary | ICD-10-CM | POA: Diagnosis not present

## 2018-07-21 DIAGNOSIS — C50811 Malignant neoplasm of overlapping sites of right female breast: Secondary | ICD-10-CM

## 2018-07-21 DIAGNOSIS — R630 Anorexia: Secondary | ICD-10-CM

## 2018-07-21 DIAGNOSIS — C773 Secondary and unspecified malignant neoplasm of axilla and upper limb lymph nodes: Secondary | ICD-10-CM | POA: Diagnosis not present

## 2018-07-21 LAB — CBC WITH DIFFERENTIAL/PLATELET
Basophils Absolute: 0.1 10*3/uL (ref 0.0–0.1)
Basophils Relative: 0 %
EOS ABS: 0.3 10*3/uL (ref 0.0–0.7)
Eosinophils Relative: 2 %
HCT: 31.8 % — ABNORMAL LOW (ref 36.0–46.0)
HEMOGLOBIN: 10.5 g/dL — AB (ref 12.0–15.0)
LYMPHS ABS: 0.9 10*3/uL (ref 0.7–4.0)
Lymphocytes Relative: 5 %
MCH: 28.3 pg (ref 26.0–34.0)
MCHC: 33 g/dL (ref 30.0–36.0)
MCV: 85.7 fL (ref 78.0–100.0)
MONOS PCT: 6 %
Monocytes Absolute: 1.2 10*3/uL — ABNORMAL HIGH (ref 0.1–1.0)
NEUTROS PCT: 87 %
Neutro Abs: 16.2 10*3/uL (ref 1.7–7.7)
Platelets: 133 10*3/uL — ABNORMAL LOW (ref 150–400)
RBC: 3.71 MIL/uL — ABNORMAL LOW (ref 3.87–5.11)
RDW: 16.8 % — ABNORMAL HIGH (ref 11.5–15.5)
WBC: 18.6 10*3/uL — ABNORMAL HIGH (ref 4.0–10.5)

## 2018-07-21 LAB — COMPREHENSIVE METABOLIC PANEL
ALK PHOS: 82 U/L (ref 38–126)
ALT: 20 U/L (ref 0–44)
ANION GAP: 11 (ref 5–15)
AST: 23 U/L (ref 15–41)
Albumin: 3.8 g/dL (ref 3.5–5.0)
BUN: 10 mg/dL (ref 8–23)
CALCIUM: 9.4 mg/dL (ref 8.9–10.3)
CO2: 26 mmol/L (ref 22–32)
CREATININE: 1.09 mg/dL — AB (ref 0.44–1.00)
Chloride: 100 mmol/L (ref 98–111)
GFR, EST AFRICAN AMERICAN: 58 mL/min — AB (ref >60)
GFR, EST NON AFRICAN AMERICAN: 50 mL/min — AB (ref >60)
Glucose, Bld: 164 mg/dL — ABNORMAL HIGH (ref 70–99)
Potassium: 3.1 mmol/L — ABNORMAL LOW (ref 3.5–5.1)
Sodium: 137 mmol/L (ref 135–145)
TOTAL PROTEIN: 7.7 g/dL (ref 6.5–8.1)
Total Bilirubin: 0.4 mg/dL (ref 0.3–1.2)

## 2018-07-21 MED ORDER — SODIUM CHLORIDE 0.9 % IV SOLN
Freq: Once | INTRAVENOUS | Status: AC
  Administered 2018-07-21: 10:00:00 via INTRAVENOUS
  Filled 2018-07-21: qty 5

## 2018-07-21 MED ORDER — DOXORUBICIN HCL CHEMO IV INJECTION 2 MG/ML
60.0000 mg/m2 | Freq: Once | INTRAVENOUS | Status: AC
  Administered 2018-07-21: 110 mg via INTRAVENOUS
  Filled 2018-07-21: qty 55

## 2018-07-21 MED ORDER — HEPARIN SOD (PORK) LOCK FLUSH 100 UNIT/ML IV SOLN
500.0000 [IU] | Freq: Once | INTRAVENOUS | Status: AC | PRN
  Administered 2018-07-21: 500 [IU]

## 2018-07-21 MED ORDER — PALONOSETRON HCL INJECTION 0.25 MG/5ML
0.2500 mg | Freq: Once | INTRAVENOUS | Status: AC
  Administered 2018-07-21: 0.25 mg via INTRAVENOUS

## 2018-07-21 MED ORDER — PALONOSETRON HCL INJECTION 0.25 MG/5ML
INTRAVENOUS | Status: AC
  Filled 2018-07-21: qty 5

## 2018-07-21 MED ORDER — POTASSIUM CHLORIDE CRYS ER 20 MEQ PO TBCR
EXTENDED_RELEASE_TABLET | ORAL | Status: AC
  Filled 2018-07-21: qty 1

## 2018-07-21 MED ORDER — SODIUM CHLORIDE 0.9 % IV SOLN
Freq: Once | INTRAVENOUS | Status: AC
  Administered 2018-07-21: 10:00:00 via INTRAVENOUS

## 2018-07-21 MED ORDER — POTASSIUM CHLORIDE CRYS ER 20 MEQ PO TBCR
20.0000 meq | EXTENDED_RELEASE_TABLET | Freq: Once | ORAL | Status: AC
  Administered 2018-07-21: 20 meq via ORAL

## 2018-07-21 MED ORDER — SODIUM CHLORIDE 0.9 % IV SOLN
600.0000 mg/m2 | Freq: Once | INTRAVENOUS | Status: AC
  Administered 2018-07-21: 1100 mg via INTRAVENOUS
  Filled 2018-07-21: qty 5

## 2018-07-21 NOTE — Progress Notes (Signed)
Tolerated tx w/o adverse reaction.  Alert, in no distress.  VSS.  Discharged ambulatory in c/o family.  

## 2018-07-21 NOTE — Assessment & Plan Note (Signed)
1.  Locally advanced (stage IIIa, T3N1) right breast cancer: - Mammogram on 03/28/2018 was BI-RADS Category 0.  Mammogram and ultrasound on 04/22/2018 shows 5.9 x 5 x 6 cm mass extending in the upper quadrant of the right breast and right axillary lymph node measuring 2.8 x 1.6 x 1.7 cm.  Left breast has retroareolar mass.  Patient did not have mammogram in the last 20 years. - Biopsy on 04/29/2018 of the right breast mass consistent with IDC, grade 2-3, lymph node biopsy positive for metastatic disease, ER/PR positive, HER-2 negative, Ki-67 of 5% -Seen by Dr. Arnoldo Morale who recommended right modified radical mastectomy and left simple mastectomy. - Bone scan showed  increased uptake in the right greater trochanter and right sacroiliac joint.  CT scan of the chest abdomen and pelvis did not show any evidence of metastatic disease.  The uptake in the right greater trochanter was thought to be due to enthesopathic change.  Uptake in the right sacroiliac joint was likely from degenerative changes at the right L5-S1 joint. - Due to advanced nature of her breast cancer, we have recommended neoadjuvant chemotherapy with dose dense AC followed by weekly paclitaxel for 12 weeks. - Cycle 1 of dose dense AC on 06/05/2018, cycle 4 on 07/21/2018. -She is tolerating chemotherapy reasonably well.  She experiences fatigue and decrease in appetite.  Her blood counts are adequate to proceed with cycle 4 without any dose changes.  We will see her back in 2 weeks to start weekly paclitaxel.  She lost about 5 to 6 pounds in the last 2 weeks.  We have told her to increase Ensure to twice daily.  2.  Left breast DCIS: -Biopsy on 04/29/2018 of the left breast shows DCIS, ER/PR positive.  Left axillary lymph node biopsy was negative for malignancy.  3.  Hypokalemia: -Her potassium today is 3.1.  She is taking potassium twice daily.  Her potassium is staying low because of Lasix she is taking daily.  I have told her to hold off on Lasix  and only take it as needed.

## 2018-07-21 NOTE — Progress Notes (Signed)
Whitney Velez, [place] [address]   CLINIC:  Medical Oncology/Hematology  PCP:  Whitney Kirschner, MD [place] [address] [phone]   REASON FOR VISIT:  Follow-up for breast cancer  CURRENT THERAPY: dose dense AC  BRIEF ONCOLOGIC HISTORY:    Breast cancer, right ([place])   05/12/2018 Initial Diagnosis    Breast cancer, right ([place])      06/02/2018 -  Chemotherapy    The patient had DOXOrubicin (ADRIAMYCIN) chemo injection 110 mg, 60 mg/m2 = 110 mg, Intravenous,  Once, 4 of 4 cycles Administration: 110 mg (06/05/2018), 110 mg (06/19/2018), 110 mg (07/07/2018) palonosetron (ALOXI) injection 0.25 mg, 0.25 mg, Intravenous,  Once, 4 of 4 cycles Administration: 0.25 mg (06/05/2018), 0.25 mg (06/19/2018), 0.25 mg (07/07/2018) pegfilgrastim-cbqv (UDENYCA) injection 6 mg, 6 mg, Subcutaneous, Once, 4 of 4 cycles Administration: 6 mg (06/06/2018), 6 mg (06/20/2018), 6 mg (07/08/2018) cyclophosphamide (CYTOXAN) 1,100 mg in sodium chloride 0.9 % 250 mL chemo infusion, 600 mg/m2 = 1,100 mg, Intravenous,  Once, 4 of 4 cycles Administration: 1,100 mg (06/05/2018), 1,100 mg (06/19/2018), 1,100 mg (07/07/2018) PACLitaxel (TAXOL) 144 mg in sodium chloride 0.9 % 250 mL chemo infusion (</= 75m/m2), 80 mg/m2, Intravenous,  Once, 0 of 12 cycles fosaprepitant (EMEND) 150 mg, dexamethasone (DECADRON) 12 mg in sodium chloride 0.9 % 145 mL IVPB, , Intravenous,  Once, 4 of 4 cycles Administration:  (06/05/2018),  (06/19/2018),  (07/07/2018)  for chemotherapy treatment.         CANCER STAGING: Cancer Staging No matching staging information was found for the patient.   INTERVAL HISTORY:  [name] returns for routine follow-up for right breast cancer and consideration for next cycle of chemotherapy. Patient is here today for her last dose of Doxorubicin. She is doing well with treatment. She does have nausea and her appetite is down. She drinks 1 can  of Ensure daily and is going to increase it to 2 cans daily. She has lost 6 pounds since her last visit. Family encourages her to eat. She gets fatigued with activity. Denies any vomiting, constipation, or diarrhea. Denies andy SOB.     REVIEW OF SYSTEMS:  Review of Systems  Constitutional: Positive for fatigue.  HENT:  Negative.   Eyes: Negative.   Respiratory: Negative.   Cardiovascular: Negative.   Gastrointestinal: Negative.   Endocrine: Negative.   Genitourinary: Negative.    Skin: Negative.   Neurological: Positive for extremity weakness.  Hematological: Negative.   Psychiatric/Behavioral: Negative.      PAST MEDICAL/SURGICAL HISTORY:  Past Medical History:  Diagnosis Date  . Asthma   . Hypertension    Past Surgical History:  Procedure Laterality Date  . ABDOMINAL HYSTERECTOMY    . PORTACATH PLACEMENT Right 06/04/2018   Procedure: INSERTION PORT-A-CATH;  Surgeon: Whitney Signs MD;  Location: [facility];  Service: General;  Laterality: Right;     SOCIAL HISTORY:  Social History   Socioeconomic History  . Marital status: Widowed    Spouse name: Not on file  . Number of children: Not on file  . Years of education: Not on file  . Highest education level: Not on file  Occupational History  . Not on file  Social Needs  . Financial resource strain: Not on file  . Food insecurity:    Worry: Not on file    Inability: Not on file  . Transportation needs:    Medical: Not on file  Non-medical: Not on file  Tobacco Use  . Smoking status: Never Smoker  . Smokeless tobacco: Never Used  Substance and Sexual Activity  . Alcohol use: Never    Frequency: Never  . Drug use: Never  . Sexual activity: Not on file  Lifestyle  . Physical activity:    Days per week: Not on file    Minutes per session: Not on file  . Stress: Not on file  Relationships  . Social connections:    Talks on phone: Not on file    Gets together: Not on file    Attends religious service: Not  on file    Active member of club or organization: Not on file    Attends meetings of clubs or organizations: Not on file    Relationship status: Not on file  . Intimate partner violence:    Fear of current or ex partner: Not on file    Emotionally abused: Not on file    Physically abused: Not on file    Forced sexual activity: Not on file  Other Topics Concern  . Not on file  Social History Narrative  . Not on file    FAMILY HISTORY:  History reviewed. No pertinent family history.  CURRENT MEDICATIONS:  Outpatient Encounter Medications as of 07/21/2018  Medication Sig  . albuterol (PROAIR HFA) 108 (90 Base) MCG/ACT inhaler INHALE TWO PUFFS INTO THE LUNGS EVERY SIX HOURS AS NEEDED FOR WHEEZING  . albuterol (PROVENTIL) (2.5 MG/3ML) 0.083% nebulizer solution Use via neb q 4 hrs prn wheezing (Patient taking differently: Take 2.5 mg by nebulization every 4 (four) hours as needed for wheezing or shortness of breath. Use via neb q 4 hrs prn wheezing)  . amLODipine (NORVASC) 10 MG tablet Take 1 tablet (10 mg total) by mouth daily.  Marland Kitchen aspirin 81 MG tablet Take 81 mg by mouth daily.  . Cholecalciferol (VITAMIN D-3) 1000 UNITS CAPS Take 1 capsule by mouth daily.   . Flaxseed, Linseed, (FLAXSEED OIL) 1000 MG CAPS Take 1 capsule by mouth daily.   . hydrochlorothiazide (HYDRODIURIL) 25 MG tablet Take 1 tablet (25 mg total) by mouth daily.  Marland Kitchen lidocaine-prilocaine (EMLA) cream Apply to affected area once  . metFORMIN (GLUCOPHAGE) 500 MG tablet Take 1 tablet (500 mg total) by mouth daily with breakfast.  . montelukast (SINGULAIR) 10 MG tablet TAKE ONE TABLET BY MOUTH EVERY NIGHT AT BEDTIME  . nabumetone (RELAFEN) 750 MG tablet Take 1 tablet (750 mg total) by mouth 2 (two) times daily. With food (Patient taking differently: Take 750 mg by mouth 2 (two) times daily as needed for mild pain. With food)  . potassium chloride SA (K-DUR,KLOR-CON) 20 MEQ tablet Take 1 tablet (20 mEq total) by mouth 2 (two)  times daily.  . prochlorperazine (COMPAZINE) 10 MG tablet Take 1 tablet (10 mg total) by mouth every 6 (six) hours as needed (Nausea or vomiting).  . traMADol (ULTRAM) 50 MG tablet Take 1 tablet (50 mg total) by mouth every 6 (six) hours as needed.   No facility-administered encounter medications on file as of 07/21/2018.     ALLERGIES:  Allergies  Allergen Reactions  . Penicillins Other (See Comments)    Nausea, vomiting, dizziness Has patient had a PCN reaction causing immediate rash, facial/tongue/throat swelling, SOB or lightheadedness with hypotension: no Has patient had a PCN reaction causing severe rash involving mucus membranes or skin necrosis: no Has patient had a PCN reaction that required hospitalization: no Has patient had a  PCN reaction occurring within the last 10 years: no If all of the above answers are "NO", then may proceed with Cephalosporin use.   Marland Kitchen Percocet [Oxycodone-Acetaminophen]     Nausea, vomiting, diarrhea  . Ranitidine Itching     PHYSICAL EXAM:  ECOG Performance status: 1  VITAL Velez: BP: 109/90, P: 83, R: 18, TEMP: 98.1, 02: 100%  Physical Exam   LABORATORY DATA:  I have reviewed the labs as listed.  CBC    Component Value Date/Time   WBC 18.6 (H) 07/21/2018 0821   RBC 3.71 (L) 07/21/2018 0821   HGB 10.5 (L) 07/21/2018 0821   HGB 12.4 02/07/2017 1109   HCT 31.8 (L) 07/21/2018 0821   HCT 38.2 02/07/2017 1109   PLT 133 (L) 07/21/2018 0821   PLT 335 02/07/2017 1109   MCV 85.7 07/21/2018 0821   MCV 86 02/07/2017 1109   MCH 28.3 07/21/2018 0821   MCHC 33.0 07/21/2018 0821   RDW 16.8 (H) 07/21/2018 0821   RDW 15.1 02/07/2017 1109   LYMPHSABS 0.9 07/21/2018 0821   LYMPHSABS 3.0 02/07/2017 1109   MONOABS 1.2 (H) 07/21/2018 0821   EOSABS 0.3 07/21/2018 0821   EOSABS 1.1 (H) 02/07/2017 1109   BASOSABS 0.1 07/21/2018 0821   BASOSABS 0.0 02/07/2017 1109   CMP Latest Ref Rng & Units 07/21/2018 07/07/2018 06/19/2018  Glucose 70 - 99 mg/dL  164(H) 123(H) 119(H)  BUN 8 - 23 mg/dL _0 Creatinine 0.44 - 1.00 mg/dL 1.09(H) 0.90 0.88  Sodium 135 - 145 mmol/L 137 138 136  Potassium 3.5 - 5.1 mmol/L 3.1(L) 3.2(L) 2.9(L)  Chloride 98 - 111 mmol/L 100 99 97(L)  CO2 22 - 32 mmol/L _1 Calcium 8.9 - 10.3 mg/dL 9.4 9.6 9.3  Total Protein 6.5 - 8.1 g/dL 7.7 7.3 7.7  Total Bilirubin 0.3 - 1.2 mg/dL 0.4 0.4 0.5  Alkaline Phos 38 - 126 U/L 82 71 98  AST 15 - 41 U/L _2 ALT 0 - 44 U/L _3 ASSESSMENT & PLAN:   Breast cancer, right ([place]) 1.  Locally advanced (stage IIIa, T3N1) right breast cancer: - Mammogram on 03/28/2018 was BI-RADS Category 0.  Mammogram and ultrasound on 04/22/2018 shows 5.9 x 5 x 6 cm mass extending in the upper quadrant of the right breast and right axillary lymph node measuring 2.8 x 1.6 x 1.7 cm.  Left breast has retroareolar mass.  Patient did not have mammogram in the last 20 years. - Biopsy on 04/29/2018 of the right breast mass consistent with IDC, grade 2-3, lymph node biopsy positive for metastatic disease, ER/PR positive, HER-2 negative, Ki-67 of 5% -Seen by Dr. Arnoldo Morale who recommended right modified radical mastectomy and left simple mastectomy. - Bone scan showed  increased uptake in the right greater trochanter and right sacroiliac joint.  CT scan of the chest abdomen and pelvis did not show any evidence of metastatic disease.  The uptake in the right greater trochanter was thought to be due to enthesopathic change.  Uptake in the right sacroiliac joint was likely from degenerative changes at the right L5-S1 joint. - Due to advanced nature of her breast cancer, we have recommended neoadjuvant chemotherapy with dose dense AC followed by weekly paclitaxel for 12 weeks. - Cycle 1 of dose dense AC on 06/05/2018, cycle 4 on 07/21/2018. -She is tolerating chemotherapy reasonably well.  She experiences fatigue and decrease in appetite.  Her  blood counts are adequate to proceed with cycle 4  without any dose changes.  We will see her back in 2 weeks to start weekly paclitaxel.  She lost about 5 to 6 pounds in the last 2 weeks.  We have told her to increase Ensure to twice daily.  2.  Left breast DCIS: -Biopsy on 04/29/2018 of the left breast shows DCIS, ER/PR positive.  Left axillary lymph node biopsy was negative for malignancy.  3.  Hypokalemia: -Her potassium today is 3.1.  She is taking potassium twice daily.  Her potassium is staying low because of Lasix she is taking daily.  I have told her to hold off on Lasix and only take it as needed.      Orders placed this encounter:  No orders of the defined types were placed in this encounter.     Derek Jack, MD [place] [phone]

## 2018-07-22 ENCOUNTER — Encounter (HOSPITAL_COMMUNITY): Payer: Self-pay

## 2018-07-22 ENCOUNTER — Inpatient Hospital Stay (HOSPITAL_COMMUNITY): Payer: Medicare Other

## 2018-07-22 ENCOUNTER — Other Ambulatory Visit: Payer: Self-pay

## 2018-07-22 VITALS — BP 124/37 | HR 69 | Temp 97.5°F | Resp 16

## 2018-07-22 DIAGNOSIS — D0512 Intraductal carcinoma in situ of left breast: Secondary | ICD-10-CM | POA: Diagnosis not present

## 2018-07-22 DIAGNOSIS — C50811 Malignant neoplasm of overlapping sites of right female breast: Secondary | ICD-10-CM

## 2018-07-22 DIAGNOSIS — E876 Hypokalemia: Secondary | ICD-10-CM | POA: Diagnosis not present

## 2018-07-22 DIAGNOSIS — Z5111 Encounter for antineoplastic chemotherapy: Secondary | ICD-10-CM | POA: Diagnosis not present

## 2018-07-22 DIAGNOSIS — R110 Nausea: Secondary | ICD-10-CM | POA: Diagnosis not present

## 2018-07-22 DIAGNOSIS — C773 Secondary and unspecified malignant neoplasm of axilla and upper limb lymph nodes: Secondary | ICD-10-CM | POA: Diagnosis not present

## 2018-07-22 DIAGNOSIS — Z170 Estrogen receptor positive status [ER+]: Principal | ICD-10-CM

## 2018-07-22 MED ORDER — PEGFILGRASTIM-CBQV 6 MG/0.6ML ~~LOC~~ SOSY
6.0000 mg | PREFILLED_SYRINGE | Freq: Once | SUBCUTANEOUS | Status: AC
  Administered 2018-07-22: 6 mg via SUBCUTANEOUS

## 2018-07-22 MED ORDER — PEGFILGRASTIM-CBQV 6 MG/0.6ML ~~LOC~~ SOSY
PREFILLED_SYRINGE | SUBCUTANEOUS | Status: AC
Start: 2018-07-22 — End: ?
  Filled 2018-07-22: qty 0.6

## 2018-07-22 NOTE — Progress Notes (Signed)
Pt here today for Udenyca injection. Pt given injection in lower left abdomen. Pt tolerated injection well with no complaint. Pt stable and discharged home ambulatory. Pt to return in 2 weeks as scheduled.

## 2018-08-01 ENCOUNTER — Telehealth (HOSPITAL_COMMUNITY): Payer: Self-pay

## 2018-08-01 NOTE — Telephone Encounter (Signed)
Returned pt's daughters phone call regarding Whitney Velez not eating much since her last chemo tx 2 weeks ago and dizziness for the last few days. No N+V reported just inability to eat solid food but she is trying to drink, especially her Ensure. They are just concerned because it seems to be lasting longer than normal. Spoke with Dr. Delton Coombes who recommended that the pt continue to drink as much as possible and try soft, bland foods over the weekend.Pt to call back on Monday if she is not better and we can give hydration or go to ER over the weekend if she gets weaker. Pt's daughter verbalized understanding

## 2018-08-04 ENCOUNTER — Telehealth (HOSPITAL_COMMUNITY): Payer: Self-pay | Admitting: *Deleted

## 2018-08-04 NOTE — Telephone Encounter (Signed)
-----   Message from Glennie Isle, NP-C sent at 08/01/2018  4:01 PM EDT ----- Tell her to stay well hydrated, eat soft foods and go to the ER if symptoms worsen. [place] Monday if things are not improved ----- Message ----- From: Farley Ly, LPN Sent: 0/12/6578  06:34 PM To: Glennie Isle, NP-C  Pt's daughter called stating that the pt has not been able to eat since her last treatment 2 weeks ago. She states that the pt usually has this for the first few days after treatment but that it has lasted 2 weeks this time. She states that she is also having dizziness that started 2-3 days ago. Pt's daughter wants to know if there is something we can give the pt to help with this.  Please advise!!  Caryl Pina

## 2018-08-04 NOTE — Telephone Encounter (Signed)
Called pt to check on her. I left a message stating to call me back tomorrow morning to give an update.

## 2018-08-07 ENCOUNTER — Inpatient Hospital Stay (HOSPITAL_COMMUNITY): Payer: Medicare Other | Attending: Hematology | Admitting: Hematology

## 2018-08-07 ENCOUNTER — Encounter (HOSPITAL_COMMUNITY): Payer: Self-pay | Admitting: Hematology

## 2018-08-07 ENCOUNTER — Inpatient Hospital Stay (HOSPITAL_COMMUNITY): Payer: Medicare Other

## 2018-08-07 ENCOUNTER — Other Ambulatory Visit: Payer: Self-pay

## 2018-08-07 VITALS — BP 128/60 | HR 70 | Temp 98.6°F | Resp 16

## 2018-08-07 DIAGNOSIS — C50811 Malignant neoplasm of overlapping sites of right female breast: Secondary | ICD-10-CM | POA: Insufficient documentation

## 2018-08-07 DIAGNOSIS — Z170 Estrogen receptor positive status [ER+]: Secondary | ICD-10-CM | POA: Diagnosis not present

## 2018-08-07 DIAGNOSIS — D0512 Intraductal carcinoma in situ of left breast: Secondary | ICD-10-CM

## 2018-08-07 DIAGNOSIS — E876 Hypokalemia: Secondary | ICD-10-CM

## 2018-08-07 DIAGNOSIS — C773 Secondary and unspecified malignant neoplasm of axilla and upper limb lymph nodes: Secondary | ICD-10-CM

## 2018-08-07 DIAGNOSIS — Z5111 Encounter for antineoplastic chemotherapy: Secondary | ICD-10-CM | POA: Insufficient documentation

## 2018-08-07 LAB — COMPREHENSIVE METABOLIC PANEL
ALT: 16 U/L (ref 0–44)
AST: 22 U/L (ref 15–41)
Albumin: 3.7 g/dL (ref 3.5–5.0)
Alkaline Phosphatase: 69 U/L (ref 38–126)
Anion gap: 8 (ref 5–15)
BUN: 16 mg/dL (ref 8–23)
CHLORIDE: 104 mmol/L (ref 98–111)
CO2: 24 mmol/L (ref 22–32)
CREATININE: 0.8 mg/dL (ref 0.44–1.00)
Calcium: 9.2 mg/dL (ref 8.9–10.3)
GFR calc Af Amer: >60 mL/min (ref >60)
GFR calc non Af Amer: >60 mL/min (ref >60)
Glucose, Bld: 147 mg/dL — ABNORMAL HIGH (ref 70–99)
Potassium: 3.8 mmol/L (ref 3.5–5.1)
SODIUM: 136 mmol/L (ref 135–145)
Total Bilirubin: 0.4 mg/dL (ref 0.3–1.2)
Total Protein: 7.2 g/dL (ref 6.5–8.1)

## 2018-08-07 LAB — CBC WITH DIFFERENTIAL/PLATELET
BASOS PCT: 0 %
Basophils Absolute: 0 10*3/uL (ref 0.0–0.1)
EOS ABS: 0.1 10*3/uL (ref 0.0–0.7)
Eosinophils Relative: 1 %
HCT: 28.1 % — ABNORMAL LOW (ref 36.0–46.0)
Hemoglobin: 9.2 g/dL — ABNORMAL LOW (ref 12.0–15.0)
Lymphocytes Relative: 9 %
Lymphs Abs: 1.3 10*3/uL (ref 0.7–4.0)
MCH: 28.8 pg (ref 26.0–34.0)
MCHC: 32.7 g/dL (ref 30.0–36.0)
MCV: 87.8 fL (ref 78.0–100.0)
MONO ABS: 0.8 10*3/uL (ref 0.1–1.0)
Monocytes Relative: 6 %
NEUTROS PCT: 84 %
Neutro Abs: 11.8 10*3/uL — ABNORMAL HIGH (ref 1.7–7.7)
PLATELETS: 375 10*3/uL (ref 150–400)
RBC: 3.2 MIL/uL — AB (ref 3.87–5.11)
RDW: 20 % — ABNORMAL HIGH (ref 11.5–15.5)
WBC: 14 10*3/uL — AB (ref 4.0–10.5)

## 2018-08-07 MED ORDER — DIPHENHYDRAMINE HCL 50 MG/ML IJ SOLN
INTRAMUSCULAR | Status: AC
  Filled 2018-08-07: qty 1

## 2018-08-07 MED ORDER — SODIUM CHLORIDE 0.9 % IV SOLN
20.0000 mg | Freq: Once | INTRAVENOUS | Status: AC
  Administered 2018-08-07: 20 mg via INTRAVENOUS
  Filled 2018-08-07: qty 2

## 2018-08-07 MED ORDER — HEPARIN SOD (PORK) LOCK FLUSH 100 UNIT/ML IV SOLN
500.0000 [IU] | Freq: Once | INTRAVENOUS | Status: AC | PRN
  Administered 2018-08-07: 500 [IU]

## 2018-08-07 MED ORDER — FAMOTIDINE IN NACL 20-0.9 MG/50ML-% IV SOLN
20.0000 mg | Freq: Once | INTRAVENOUS | Status: AC
  Administered 2018-08-07: 20 mg via INTRAVENOUS

## 2018-08-07 MED ORDER — DIPHENHYDRAMINE HCL 50 MG/ML IJ SOLN
50.0000 mg | Freq: Once | INTRAMUSCULAR | Status: AC
  Administered 2018-08-07: 50 mg via INTRAVENOUS

## 2018-08-07 MED ORDER — FAMOTIDINE IN NACL 20-0.9 MG/50ML-% IV SOLN
INTRAVENOUS | Status: AC
  Filled 2018-08-07: qty 50

## 2018-08-07 MED ORDER — SODIUM CHLORIDE 0.9 % IV SOLN
Freq: Once | INTRAVENOUS | Status: AC
  Administered 2018-08-07: 09:00:00 via INTRAVENOUS

## 2018-08-07 MED ORDER — SODIUM CHLORIDE 0.9 % IV SOLN
80.0000 mg/m2 | Freq: Once | INTRAVENOUS | Status: AC
  Administered 2018-08-07: 144 mg via INTRAVENOUS
  Filled 2018-08-07: qty 24

## 2018-08-07 MED ORDER — SODIUM CHLORIDE 0.9% FLUSH
10.0000 mL | INTRAVENOUS | Status: DC | PRN
  Administered 2018-08-07: 10 mL
  Filled 2018-08-07: qty 10

## 2018-08-07 NOTE — Patient Instructions (Signed)
[facility] Discharge Instructions for Patients Receiving Chemotherapy  Today you received the following chemotherapy agents taxol.     If you develop nausea and vomiting that is not controlled by your nausea medication, call the clinic.   BELOW ARE SYMPTOMS THAT SHOULD BE REPORTED IMMEDIATELY:  *FEVER GREATER THAN 100.5 F  *CHILLS WITH OR WITHOUT FEVER  NAUSEA AND VOMITING THAT IS NOT CONTROLLED WITH YOUR NAUSEA MEDICATION  *UNUSUAL SHORTNESS OF BREATH  *UNUSUAL BRUISING OR BLEEDING  TENDERNESS IN MOUTH AND THROAT WITH OR WITHOUT PRESENCE OF ULCERS  *URINARY PROBLEMS  *BOWEL PROBLEMS  UNUSUAL RASH Items with * indicate a potential emergency and should be followed up as soon as possible.  Feel free to call the clinic should you have any questions or concerns. The clinic phone number is [phone].  Please show the CHEMO ALERT CARD at check-in to the Emergency Department and triage nurse.   

## 2018-08-07 NOTE — Progress Notes (Signed)
[place],  [address]   CLINIC:  Medical Oncology/Hematology  PCP:  Mikey Kirschner, MD [place] [address] [phone]   REASON FOR VISIT:  Follow-up for right breast cancer  CURRENT THERAPY: dose dense AC  BRIEF ONCOLOGIC HISTORY:    Breast cancer, right ([place])   05/12/2018 Initial Diagnosis    Breast cancer, right ([place])    06/02/2018 -  Chemotherapy    The patient had DOXOrubicin (ADRIAMYCIN) chemo injection 110 mg, 60 mg/m2 = 110 mg, Intravenous,  Once, 4 of 4 cycles Administration: 110 mg (06/05/2018), 110 mg (06/19/2018), 110 mg (07/07/2018), 110 mg (07/21/2018) palonosetron (ALOXI) injection 0.25 mg, 0.25 mg, Intravenous,  Once, 4 of 4 cycles Administration: 0.25 mg (06/05/2018), 0.25 mg (06/19/2018), 0.25 mg (07/07/2018), 0.25 mg (07/21/2018) pegfilgrastim-cbqv (UDENYCA) injection 6 mg, 6 mg, Subcutaneous, Once, 4 of 4 cycles Administration: 6 mg (06/06/2018), 6 mg (06/20/2018), 6 mg (07/08/2018), 6 mg (07/22/2018) cyclophosphamide (CYTOXAN) 1,100 mg in sodium chloride 0.9 % 250 mL chemo infusion, 600 mg/m2 = 1,100 mg, Intravenous,  Once, 4 of 4 cycles Administration: 1,100 mg (06/05/2018), 1,100 mg (06/19/2018), 1,100 mg (07/07/2018), 1,100 mg (07/21/2018) PACLitaxel (TAXOL) 144 mg in sodium chloride 0.9 % 250 mL chemo infusion (</= 43m/m2), 80 mg/m2 = 144 mg, Intravenous,  Once, 1 of 12 cycles fosaprepitant (EMEND) 150 mg, dexamethasone (DECADRON) 12 mg in sodium chloride 0.9 % 145 mL IVPB, , Intravenous,  Once, 4 of 4 cycles Administration:  (06/05/2018),  (06/19/2018),  (07/07/2018),  (07/21/2018)  for chemotherapy treatment.       INTERVAL HISTORY:  Ms. [age]Whittier89y.o. female returns for routine follow-up for breast cancer. Patient is here today with her son in law. Patient had a rough last week but seems to be improving now. She was feeling lightheaded and dizzy. Felt like she would almost black out. Her appetite was  decreased. She started drinking boost daily and now her appetite has improved and has returned to 100%. Patient is living at home and performing all her own ADLs and activities. Her energy level is at 25%.    REVIEW OF SYSTEMS:  Review of Systems  Constitutional: Negative.   HENT:  Negative.   Eyes: Negative.   Respiratory: Negative.   Cardiovascular: Negative.   Gastrointestinal: Positive for nausea.  Endocrine: Negative.   Genitourinary: Negative.    Skin: Negative.   Neurological: Positive for dizziness and extremity weakness.  Hematological: Negative.   Psychiatric/Behavioral: Negative.      PAST MEDICAL/SURGICAL HISTORY:  Past Medical History:  Diagnosis Date  . Asthma   . Hypertension    Past Surgical History:  Procedure Laterality Date  . ABDOMINAL HYSTERECTOMY    . PORTACATH PLACEMENT Right 06/04/2018   Procedure: INSERTION PORT-A-CATH;  Surgeon: JAviva Signs MD;  Location: [facility];  Service: General;  Laterality: Right;     SOCIAL HISTORY:  Social History   Socioeconomic History  . Marital status: Widowed    Spouse name: Not on file  . Number of children: Not on file  . Years of education: Not on file  . Highest education level: Not on file  Occupational History  . Not on file  Social Needs  . Financial resource strain: Not on file  . Food insecurity:    Worry: Not on file    Inability: Not on file  . Transportation needs:    Medical: Not on file    Non-medical: Not on  file  Tobacco Use  . Smoking status: Never Smoker  . Smokeless tobacco: Never Used  Substance and Sexual Activity  . Alcohol use: Never    Frequency: Never  . Drug use: Never  . Sexual activity: Not on file  Lifestyle  . Physical activity:    Days per week: Not on file    Minutes per session: Not on file  . Stress: Not on file  Relationships  . Social connections:    Talks on phone: Not on file    Gets together: Not on file    Attends religious service: Not on file     Active member of club or organization: Not on file    Attends meetings of clubs or organizations: Not on file    Relationship status: Not on file  . Intimate partner violence:    Fear of current or ex partner: Not on file    Emotionally abused: Not on file    Physically abused: Not on file    Forced sexual activity: Not on file  Other Topics Concern  . Not on file  Social History Narrative  . Not on file    FAMILY HISTORY:  History reviewed. No pertinent family history.  CURRENT MEDICATIONS:  Outpatient Encounter Medications as of 08/07/2018  Medication Sig  . albuterol (PROAIR HFA) 108 (90 Base) MCG/ACT inhaler INHALE TWO PUFFS INTO THE LUNGS EVERY SIX HOURS AS NEEDED FOR WHEEZING  . albuterol (PROVENTIL) (2.5 MG/3ML) 0.083% nebulizer solution Use via neb q 4 hrs prn wheezing (Patient taking differently: Take 2.5 mg by nebulization every 4 (four) hours as needed for wheezing or shortness of breath. Use via neb q 4 hrs prn wheezing)  . amLODipine (NORVASC) 10 MG tablet Take 1 tablet (10 mg total) by mouth daily.  Marland Kitchen aspirin 81 MG tablet Take 81 mg by mouth daily.  . Cholecalciferol (VITAMIN D-3) 1000 UNITS CAPS Take 1 capsule by mouth daily.   . Flaxseed, Linseed, (FLAXSEED OIL) 1000 MG CAPS Take 1 capsule by mouth daily.   . hydrochlorothiazide (HYDRODIURIL) 25 MG tablet Take 1 tablet (25 mg total) by mouth daily.  Marland Kitchen lidocaine-prilocaine (EMLA) cream Apply to affected area once  . metFORMIN (GLUCOPHAGE) 500 MG tablet Take 1 tablet (500 mg total) by mouth daily with breakfast.  . montelukast (SINGULAIR) 10 MG tablet TAKE ONE TABLET BY MOUTH EVERY NIGHT AT BEDTIME  . nabumetone (RELAFEN) 750 MG tablet Take 1 tablet (750 mg total) by mouth 2 (two) times daily. With food (Patient taking differently: Take 750 mg by mouth 2 (two) times daily as needed for mild pain. With food)  . potassium chloride SA (K-DUR,KLOR-CON) 20 MEQ tablet Take 1 tablet (20 mEq total) by mouth 2 (two) times daily.  .  prochlorperazine (COMPAZINE) 10 MG tablet Take 1 tablet (10 mg total) by mouth every 6 (six) hours as needed (Nausea or vomiting).  . traMADol (ULTRAM) 50 MG tablet Take 1 tablet (50 mg total) by mouth every 6 (six) hours as needed.   No facility-administered encounter medications on file as of 08/07/2018.     ALLERGIES:  Allergies  Allergen Reactions  . Penicillins Other (See Comments)    Nausea, vomiting, dizziness Has patient had a PCN reaction causing immediate rash, facial/tongue/throat swelling, SOB or lightheadedness with hypotension: no Has patient had a PCN reaction causing severe rash involving mucus membranes or skin necrosis: no Has patient had a PCN reaction that required hospitalization: no Has patient had a PCN reaction occurring  within the last 10 years: no If all of the above answers are "NO", then may proceed with Cephalosporin use.   Marland Kitchen Percocet [Oxycodone-Acetaminophen]     Nausea, vomiting, diarrhea  . Ranitidine Itching     PHYSICAL EXAM:  ECOG Performance status: 1  Vitals:   08/07/18 0855  BP: 130/64  Pulse: 79  Resp: 16  Temp: 98.4 F (36.9 C)  SpO2: 100%   Filed Weights   08/07/18 0855  Weight: 158 lb 9.6 oz (71.9 kg)    Physical Exam  Constitutional: She is oriented to person, place, and time. She appears well-developed and well-nourished.  Cardiovascular: Normal rate, regular rhythm and normal heart sounds.  Pulmonary/Chest: Effort normal and breath sounds normal.  Neurological: She is alert and oriented to person, place, and time.  Skin: Skin is warm and dry.     LABORATORY DATA:  I have reviewed the labs as listed.  CBC    Component Value Date/Time   WBC 14.0 (H) 08/07/2018 0759   RBC 3.20 (L) 08/07/2018 0759   HGB 9.2 (L) 08/07/2018 0759   HGB 12.4 02/07/2017 1109   HCT 28.1 (L) 08/07/2018 0759   HCT 38.2 02/07/2017 1109   PLT 375 08/07/2018 0759   PLT 335 02/07/2017 1109   MCV 87.8 08/07/2018 0759   MCV 86 02/07/2017 1109    MCH 28.8 08/07/2018 0759   MCHC 32.7 08/07/2018 0759   RDW 20.0 (H) 08/07/2018 0759   RDW 15.1 02/07/2017 1109   LYMPHSABS 1.3 08/07/2018 0759   LYMPHSABS 3.0 02/07/2017 1109   MONOABS 0.8 08/07/2018 0759   EOSABS 0.1 08/07/2018 0759   EOSABS 1.1 (H) 02/07/2017 1109   BASOSABS 0.0 08/07/2018 0759   BASOSABS 0.0 02/07/2017 1109   CMP Latest Ref Rng & Units 08/07/2018 07/21/2018 07/07/2018  Glucose 70 - 99 mg/dL 147(H) 164(H) 123(H)  BUN 8 - 23 mg/dL '16 10 14  ' Creatinine 0.44 - 1.00 mg/dL 0.80 1.09(H) 0.90  Sodium 135 - 145 mmol/L 136 137 138  Potassium 3.5 - 5.1 mmol/L 3.8 3.1(L) 3.2(L)  Chloride 98 - 111 mmol/L 104 100 99  CO2 22 - 32 mmol/L '24 26 29  ' Calcium 8.9 - 10.3 mg/dL 9.2 9.4 9.6  Total Protein 6.5 - 8.1 g/dL 7.2 7.7 7.3  Total Bilirubin 0.3 - 1.2 mg/dL 0.4 0.4 0.4  Alkaline Phos 38 - 126 U/L 69 82 71  AST 15 - 41 U/L '22 23 22  ' ALT 0 - 44 U/L '16 20 20       ' ASSESSMENT & PLAN:   Breast cancer, right ([facility]) 1.  Locally advanced (stage IIIa, T3N1) right breast cancer: - Mammogram on 03/28/2018 was BI-RADS Category 0.  Mammogram and ultrasound on 04/22/2018 shows 5.9 x 5 x 6 cm mass extending in the upper quadrant of the right breast and right axillary lymph node measuring 2.8 x 1.6 x 1.7 cm.  Left breast has retroareolar mass.  Patient did not have mammogram in the last 20 years. - Biopsy on 04/29/2018 of the right breast mass consistent with IDC, grade 2-3, lymph node biopsy positive for metastatic disease, ER/PR positive, HER-2 negative, Ki-67 of 5% -Seen by Dr. Arnoldo Morale who recommended right modified radical mastectomy and left simple mastectomy. - Bone scan showed  increased uptake in the right greater trochanter and right sacroiliac joint.  CT scan of the chest abdomen and pelvis did not show any evidence of metastatic disease.  The uptake in the right greater trochanter was thought  to be due to enthesopathic change.  Uptake in the right sacroiliac joint was likely from  degenerative changes at the right L5-S1 joint. - Due to advanced nature of her breast cancer, we have recommended neoadjuvant chemotherapy with dose dense AC followed by weekly paclitaxel for 12 weeks. - 4 cycles of dose dense AC from 06/05/2018 through 07/21/2018. -We talked about starting her on weekly paclitaxel today.  We talked about the various side effects including but not limited to allergic reactions, peripheral neuropathy, cytopenias, GI side effects among others.  She understands and gives [place] permission to proceed with the treatment.  I plan to see her every other week when she receives weekly paclitaxel for 12 weeks.  2.  Left breast DCIS: -Biopsy on 04/29/2018 of the left breast shows DCIS, ER/PR positive.  Left axillary lymph node biopsy was negative for malignancy.  3.  Hypokalemia: -She is continuing potassium supplements.  Potassium is normal today.      Orders placed this encounter:  Orders Placed This Encounter  Procedures  . CBC with Differential/Platelet  . Comprehensive metabolic panel      Derek Jack, MD [place] [phone]

## 2018-08-07 NOTE — Progress Notes (Signed)
Patient seen by Dr. Delton Coombes for follow up and lab review.  Ok to treat today.  Patient tolerated chemotherapy with no complaints voiced.  Port site clean and dry with no bruising or swelling noted at site.  No blood return noted before or after administration but denied pain with flush.  Band aid applied.  VSS with discharge and left ambulatory with family.  No s/s of distress noted.

## 2018-08-07 NOTE — Assessment & Plan Note (Signed)
1.  Locally advanced (stage IIIa, T3N1) right breast cancer: - Mammogram on 03/28/2018 was BI-RADS Category 0.  Mammogram and ultrasound on 04/22/2018 shows 5.9 x 5 x 6 cm mass extending in the upper quadrant of the right breast and right axillary lymph node measuring 2.8 x 1.6 x 1.7 cm.  Left breast has retroareolar mass.  Patient did not have mammogram in the last 20 years. - Biopsy on 04/29/2018 of the right breast mass consistent with IDC, grade 2-3, lymph node biopsy positive for metastatic disease, ER/PR positive, HER-2 negative, Ki-67 of 5% -Seen by Dr. Arnoldo Morale who recommended right modified radical mastectomy and left simple mastectomy. - Bone scan showed  increased uptake in the right greater trochanter and right sacroiliac joint.  CT scan of the chest abdomen and pelvis did not show any evidence of metastatic disease.  The uptake in the right greater trochanter was thought to be due to enthesopathic change.  Uptake in the right sacroiliac joint was likely from degenerative changes at the right L5-S1 joint. - Due to advanced nature of her breast cancer, we have recommended neoadjuvant chemotherapy with dose dense AC followed by weekly paclitaxel for 12 weeks. - 4 cycles of dose dense AC from 06/05/2018 through 07/21/2018. -We talked about starting her on weekly paclitaxel today.  We talked about the various side effects including but not limited to allergic reactions, peripheral neuropathy, cytopenias, GI side effects among others.  She understands and gives [place] permission to proceed with the treatment.  I plan to see her every other week when she receives weekly paclitaxel for 12 weeks.  2.  Left breast DCIS: -Biopsy on 04/29/2018 of the left breast shows DCIS, ER/PR positive.  Left axillary lymph node biopsy was negative for malignancy.  3.  Hypokalemia: -She is continuing potassium supplements.  Potassium is normal today.

## 2018-08-07 NOTE — Patient Instructions (Addendum)
[place] [facility] Discharge Instructions  Continue treatment on your normal schedule. We will see you back in 2 weeks with labs and treatment.    Thank you for choosing [place] at [facility] to provide your oncology and hematology care.  To afford each patient quality time with our provider, please arrive at least 15 minutes before your scheduled appointment time.   If you have a lab appointment with the [facility] please come in thru the  Main Entrance and check in at the main information desk  You need to re-schedule your appointment should you arrive 10 or more minutes late.  We strive to give you quality time with our providers, and arriving late affects you and other patients whose appointments are after yours.  Also, if you no show three or more times for appointments you may be dismissed from the clinic at the providers discretion.     Again, thank you for choosing [facility].  Our hope is that these requests will decrease the amount of time that you wait before being seen by our physicians.       _____________________________________________________________  Should you have questions after your visit to [facility], please contact our office at [phone] between the hours of 8:00 a.m. and 4:30 p.m.  Voicemails left after 4:00 p.m. will not be returned until the following business day.  For prescription refill requests, have your pharmacy contact our office and allow 72 hours.    Cancer Center Support Programs:   > Cancer Support Group  2nd Tuesday of the month 1pm-2pm, Journey Room

## 2018-08-14 ENCOUNTER — Inpatient Hospital Stay (HOSPITAL_COMMUNITY): Payer: Medicare Other

## 2018-08-14 ENCOUNTER — Other Ambulatory Visit: Payer: Self-pay

## 2018-08-14 ENCOUNTER — Encounter (HOSPITAL_COMMUNITY): Payer: Self-pay

## 2018-08-14 VITALS — BP 113/54 | HR 77 | Temp 98.6°F | Resp 16 | Wt 158.6 lb

## 2018-08-14 DIAGNOSIS — C50811 Malignant neoplasm of overlapping sites of right female breast: Secondary | ICD-10-CM

## 2018-08-14 DIAGNOSIS — E876 Hypokalemia: Secondary | ICD-10-CM | POA: Diagnosis not present

## 2018-08-14 DIAGNOSIS — D0512 Intraductal carcinoma in situ of left breast: Secondary | ICD-10-CM | POA: Diagnosis not present

## 2018-08-14 DIAGNOSIS — Z170 Estrogen receptor positive status [ER+]: Principal | ICD-10-CM

## 2018-08-14 DIAGNOSIS — Z5111 Encounter for antineoplastic chemotherapy: Secondary | ICD-10-CM | POA: Diagnosis not present

## 2018-08-14 LAB — COMPREHENSIVE METABOLIC PANEL
ALT: 21 U/L (ref 0–44)
ANION GAP: 8 (ref 5–15)
AST: 23 U/L (ref 15–41)
Albumin: 3.7 g/dL (ref 3.5–5.0)
Alkaline Phosphatase: 55 U/L (ref 38–126)
BUN: 14 mg/dL (ref 8–23)
CALCIUM: 9.4 mg/dL (ref 8.9–10.3)
CHLORIDE: 103 mmol/L (ref 98–111)
CO2: 25 mmol/L (ref 22–32)
Creatinine, Ser: 0.81 mg/dL (ref 0.44–1.00)
GFR calc non Af Amer: >60 mL/min (ref >60)
Glucose, Bld: 143 mg/dL — ABNORMAL HIGH (ref 70–99)
Potassium: 3.8 mmol/L (ref 3.5–5.1)
SODIUM: 136 mmol/L (ref 135–145)
Total Bilirubin: 0.7 mg/dL (ref 0.3–1.2)
Total Protein: 7.3 g/dL (ref 6.5–8.1)

## 2018-08-14 LAB — CBC WITH DIFFERENTIAL/PLATELET
BASOS PCT: 2 %
Basophils Absolute: 0.1 10*3/uL (ref 0.0–0.1)
EOS ABS: 0.1 10*3/uL (ref 0.0–0.7)
Eosinophils Relative: 1 %
HCT: 28.5 % — ABNORMAL LOW (ref 36.0–46.0)
Hemoglobin: 9.4 g/dL — ABNORMAL LOW (ref 12.0–15.0)
LYMPHS ABS: 0.9 10*3/uL (ref 0.7–4.0)
Lymphocytes Relative: 14 %
MCH: 29.3 pg (ref 26.0–34.0)
MCHC: 33 g/dL (ref 30.0–36.0)
MCV: 88.8 fL (ref 78.0–100.0)
MONO ABS: 0.3 10*3/uL (ref 0.1–1.0)
Monocytes Relative: 5 %
NEUTROS ABS: 5.1 10*3/uL (ref 1.7–7.7)
NEUTROS PCT: 78 %
PLATELETS: 401 10*3/uL — AB (ref 150–400)
RBC: 3.21 MIL/uL — ABNORMAL LOW (ref 3.87–5.11)
RDW: 20.9 % — AB (ref 11.5–15.5)
WBC: 6.5 10*3/uL (ref 4.0–10.5)

## 2018-08-14 MED ORDER — SODIUM CHLORIDE 0.9 % IV SOLN
80.0000 mg/m2 | Freq: Once | INTRAVENOUS | Status: AC
  Administered 2018-08-14: 144 mg via INTRAVENOUS
  Filled 2018-08-14: qty 24

## 2018-08-14 MED ORDER — HEPARIN SOD (PORK) LOCK FLUSH 100 UNIT/ML IV SOLN
500.0000 [IU] | Freq: Once | INTRAVENOUS | Status: AC | PRN
  Administered 2018-08-14: 500 [IU]
  Filled 2018-08-14: qty 5

## 2018-08-14 MED ORDER — FAMOTIDINE IN NACL 20-0.9 MG/50ML-% IV SOLN
20.0000 mg | Freq: Once | INTRAVENOUS | Status: AC
  Administered 2018-08-14: 20 mg via INTRAVENOUS
  Filled 2018-08-14: qty 50

## 2018-08-14 MED ORDER — SODIUM CHLORIDE 0.9 % IV SOLN
Freq: Once | INTRAVENOUS | Status: AC
  Administered 2018-08-14: 10:00:00 via INTRAVENOUS

## 2018-08-14 MED ORDER — SODIUM CHLORIDE 0.9% FLUSH
10.0000 mL | INTRAVENOUS | Status: DC | PRN
  Administered 2018-08-14: 10 mL
  Filled 2018-08-14: qty 10

## 2018-08-14 MED ORDER — SODIUM CHLORIDE 0.9 % IV SOLN
20.0000 mg | Freq: Once | INTRAVENOUS | Status: AC
  Administered 2018-08-14: 20 mg via INTRAVENOUS
  Filled 2018-08-14: qty 2

## 2018-08-14 MED ORDER — DIPHENHYDRAMINE HCL 50 MG/ML IJ SOLN
50.0000 mg | Freq: Once | INTRAMUSCULAR | Status: AC
Start: 2018-08-14 — End: 2018-08-14
  Administered 2018-08-14: 50 mg via INTRAVENOUS
  Filled 2018-08-14: qty 1

## 2018-08-14 NOTE — Progress Notes (Signed)
Labs reviewed by MD today. Proceed with treatment per MD.   Treatment given per orders. Patient tolerated it well without problems. Vitals stable and discharged home from clinic ambulatory. Follow up as scheduled.  

## 2018-08-14 NOTE — Patient Instructions (Signed)
[facility] Discharge Instructions for Patients Receiving Chemotherapy   Beginning January 23rd 2017 lab work for the Cancer Center will be done in the  Main lab at [facility] on 1st floor. If you have a lab appointment with the Cancer Center please come in thru the  Main Entrance and check in at the main information desk   Today you received the following chemotherapy agents   To help prevent nausea and vomiting after your treatment, we encourage you to take your nausea medication     If you develop nausea and vomiting, or diarrhea that is not controlled by your medication, call the clinic.  The clinic phone number is [phone]. Office hours are Monday-Friday 8:30am-5:00pm.  BELOW ARE SYMPTOMS THAT SHOULD BE REPORTED IMMEDIATELY:  *FEVER GREATER THAN 101.0 F  *CHILLS WITH OR WITHOUT FEVER  NAUSEA AND VOMITING THAT IS NOT CONTROLLED WITH YOUR NAUSEA MEDICATION  *UNUSUAL SHORTNESS OF BREATH  *UNUSUAL BRUISING OR BLEEDING  TENDERNESS IN MOUTH AND THROAT WITH OR WITHOUT PRESENCE OF ULCERS  *URINARY PROBLEMS  *BOWEL PROBLEMS  UNUSUAL RASH Items with * indicate a potential emergency and should be followed up as soon as possible. If you have an emergency after office hours please contact your primary care physician or go to the nearest emergency department.  Please call the clinic during office hours if you have any questions or concerns.   You may also contact the Patient Navigator at [phone] should you have any questions or need assistance in obtaining follow up care.      Resources For Cancer Patients and their Caregivers ? American Cancer Society: Can assist with transportation, wigs, general needs, runs Look Good Feel Better.        1-888-227-6333 ? Cancer Care: Provides financial assistance, online support groups, medication/co-pay assistance.  1-800-813-HOPE (4673) ? [facility] Assists [place] cancer  patients and their families through emotional , educational and financial support.  336-427-4357 ? [place] DSS Where to apply for food stamps, Medicaid and utility assistance. 336-342-1394 ? [facility]: Transportation to medical appointments. 336-347-2287 ? Social Security Administration: May apply for disability if have a Stage IV cancer. 336-342-7796 1-800-772-1213 ? [place] Aging, Disability and Transit Services: Assists with nutrition, care and transit needs. 336-349-2343         

## 2018-08-18 ENCOUNTER — Telehealth (HOSPITAL_COMMUNITY): Payer: Self-pay | Admitting: Hematology

## 2018-08-18 MED FILL — POTASSIUM CL ER 20 MEQ TABL: 20 | 90 days supply | Qty: 180 | Fill #1

## 2018-08-18 NOTE — Telephone Encounter (Signed)
Reordered pts 90 day supply of potassium from [facility]. Bal of $23.01 will be billed to [facility] °

## 2018-08-21 ENCOUNTER — Encounter (HOSPITAL_COMMUNITY): Payer: Self-pay | Admitting: Hematology

## 2018-08-21 ENCOUNTER — Inpatient Hospital Stay (HOSPITAL_COMMUNITY): Payer: Medicare Other

## 2018-08-21 ENCOUNTER — Inpatient Hospital Stay (HOSPITAL_BASED_OUTPATIENT_CLINIC_OR_DEPARTMENT_OTHER): Payer: Medicare Other | Admitting: Hematology

## 2018-08-21 ENCOUNTER — Other Ambulatory Visit: Payer: Self-pay

## 2018-08-21 VITALS — BP 125/58 | HR 66 | Temp 98.9°F | Resp 18 | Wt 157.3 lb

## 2018-08-21 VITALS — BP 112/58 | HR 69 | Temp 98.4°F | Resp 18

## 2018-08-21 DIAGNOSIS — C773 Secondary and unspecified malignant neoplasm of axilla and upper limb lymph nodes: Secondary | ICD-10-CM | POA: Diagnosis not present

## 2018-08-21 DIAGNOSIS — E876 Hypokalemia: Secondary | ICD-10-CM | POA: Diagnosis not present

## 2018-08-21 DIAGNOSIS — C50811 Malignant neoplasm of overlapping sites of right female breast: Secondary | ICD-10-CM

## 2018-08-21 DIAGNOSIS — Z170 Estrogen receptor positive status [ER+]: Secondary | ICD-10-CM | POA: Diagnosis not present

## 2018-08-21 DIAGNOSIS — D0512 Intraductal carcinoma in situ of left breast: Secondary | ICD-10-CM | POA: Diagnosis not present

## 2018-08-21 DIAGNOSIS — Z5111 Encounter for antineoplastic chemotherapy: Secondary | ICD-10-CM | POA: Diagnosis not present

## 2018-08-21 LAB — COMPREHENSIVE METABOLIC PANEL
ALK PHOS: 51 U/L (ref 38–126)
ALT: 19 U/L (ref 0–44)
ANION GAP: 9 (ref 5–15)
AST: 24 U/L (ref 15–41)
Albumin: 3.8 g/dL (ref 3.5–5.0)
BUN: 14 mg/dL (ref 8–23)
CALCIUM: 9.6 mg/dL (ref 8.9–10.3)
CO2: 27 mmol/L (ref 22–32)
Chloride: 102 mmol/L (ref 98–111)
Creatinine, Ser: 0.87 mg/dL (ref 0.44–1.00)
GFR calc non Af Amer: >60 mL/min (ref >60)
Glucose, Bld: 114 mg/dL — ABNORMAL HIGH (ref 70–99)
POTASSIUM: 4.1 mmol/L (ref 3.5–5.1)
SODIUM: 138 mmol/L (ref 135–145)
Total Bilirubin: 0.6 mg/dL (ref 0.3–1.2)
Total Protein: 7.1 g/dL (ref 6.5–8.1)

## 2018-08-21 LAB — CBC WITH DIFFERENTIAL/PLATELET
BASOS ABS: 0.1 10*3/uL (ref 0.0–0.1)
BASOS PCT: 1 %
EOS ABS: 0.4 10*3/uL (ref 0.0–0.7)
Eosinophils Relative: 7 %
HCT: 27.7 % — ABNORMAL LOW (ref 36.0–46.0)
Hemoglobin: 9.2 g/dL — ABNORMAL LOW (ref 12.0–15.0)
LYMPHS ABS: 1 10*3/uL (ref 0.7–4.0)
Lymphocytes Relative: 17 %
MCH: 30.2 pg (ref 26.0–34.0)
MCHC: 33.2 g/dL (ref 30.0–36.0)
MCV: 90.8 fL (ref 78.0–100.0)
Monocytes Absolute: 0.1 10*3/uL (ref 0.1–1.0)
Monocytes Relative: 2 %
NEUTROS ABS: 4.1 10*3/uL (ref 1.7–7.7)
Neutrophils Relative %: 73 %
Platelets: 270 10*3/uL (ref 150–400)
RBC: 3.05 MIL/uL — ABNORMAL LOW (ref 3.87–5.11)
RDW: 22.5 % — ABNORMAL HIGH (ref 11.5–15.5)
WBC: 5.7 10*3/uL (ref 4.0–10.5)

## 2018-08-21 MED ORDER — SODIUM CHLORIDE 0.9% FLUSH
10.0000 mL | INTRAVENOUS | Status: DC | PRN
  Administered 2018-08-21: 10 mL
  Filled 2018-08-21: qty 10

## 2018-08-21 MED ORDER — DIPHENHYDRAMINE HCL 50 MG/ML IJ SOLN
50.0000 mg | Freq: Once | INTRAMUSCULAR | Status: AC
  Administered 2018-08-21: 50 mg via INTRAVENOUS

## 2018-08-21 MED ORDER — SODIUM CHLORIDE 0.9 % IV SOLN
80.0000 mg/m2 | Freq: Once | INTRAVENOUS | Status: AC
  Administered 2018-08-21: 144 mg via INTRAVENOUS
  Filled 2018-08-21: qty 24

## 2018-08-21 MED ORDER — SODIUM CHLORIDE 0.9 % IV SOLN
Freq: Once | INTRAVENOUS | Status: AC
  Administered 2018-08-21: 10:00:00 via INTRAVENOUS

## 2018-08-21 MED ORDER — SODIUM CHLORIDE 0.9 % IV SOLN
20.0000 mg | Freq: Once | INTRAVENOUS | Status: AC
  Administered 2018-08-21: 20 mg via INTRAVENOUS
  Filled 2018-08-21: qty 2

## 2018-08-21 MED ORDER — FAMOTIDINE IN NACL 20-0.9 MG/50ML-% IV SOLN
20.0000 mg | Freq: Once | INTRAVENOUS | Status: AC
  Administered 2018-08-21: 20 mg via INTRAVENOUS

## 2018-08-21 MED ORDER — HEPARIN SOD (PORK) LOCK FLUSH 100 UNIT/ML IV SOLN
500.0000 [IU] | Freq: Once | INTRAVENOUS | Status: AC | PRN
  Administered 2018-08-21: 500 [IU]

## 2018-08-21 MED ORDER — FAMOTIDINE IN NACL 20-0.9 MG/50ML-% IV SOLN
INTRAVENOUS | Status: AC
  Filled 2018-08-21: qty 50

## 2018-08-21 MED ORDER — DIPHENHYDRAMINE HCL 50 MG/ML IJ SOLN
INTRAMUSCULAR | Status: AC
  Filled 2018-08-21: qty 1

## 2018-08-21 NOTE — Patient Instructions (Signed)
[facility] Discharge Instructions for Patients Receiving Chemotherapy   Beginning January 23rd 2017 lab work for the Cancer Center will be done in the  Main lab at [facility] on 1st floor. If you have a lab appointment with the Cancer Center please come in thru the  Main Entrance and check in at the main information desk   Today you received the following chemotherapy agents Taxol. Follow-up as scheduled. Call clinic for any questions or concerns  To help prevent nausea and vomiting after your treatment, we encourage you to take your nausea medication   If you develop nausea and vomiting, or diarrhea that is not controlled by your medication, call the clinic.  The clinic phone number is [phone]. Office hours are Monday-Friday 8:30am-5:00pm.  BELOW ARE SYMPTOMS THAT SHOULD BE REPORTED IMMEDIATELY:  *FEVER GREATER THAN 101.0 F  *CHILLS WITH OR WITHOUT FEVER  NAUSEA AND VOMITING THAT IS NOT CONTROLLED WITH YOUR NAUSEA MEDICATION  *UNUSUAL SHORTNESS OF BREATH  *UNUSUAL BRUISING OR BLEEDING  TENDERNESS IN MOUTH AND THROAT WITH OR WITHOUT PRESENCE OF ULCERS  *URINARY PROBLEMS  *BOWEL PROBLEMS  UNUSUAL RASH Items with * indicate a potential emergency and should be followed up as soon as possible. If you have an emergency after office hours please contact your primary care physician or go to the nearest emergency department.  Please call the clinic during office hours if you have any questions or concerns.   You may also contact the Patient Navigator at [phone] should you have any questions or need assistance in obtaining follow up care.      Resources For Cancer Patients and their Caregivers ? American Cancer Society: Can assist with transportation, wigs, general needs, runs Look Good Feel Better.        1-888-227-6333 ? Cancer Care: Provides financial assistance, online support groups, medication/co-pay assistance.  1-800-813-HOPE  (4673) ? [facility] Assists [place] cancer patients and their families through emotional , educational and financial support.  336-427-4357 ? [place] DSS Where to apply for food stamps, Medicaid and utility assistance. 336-342-1394 ? [facility]: Transportation to medical appointments. 336-347-2287 ? Social Security Administration: May apply for disability if have a Stage IV cancer. 336-342-7796 1-800-772-1213 ? [place] Aging, Disability and Transit Services: Assists with nutrition, care and transit needs. 336-349-2343         

## 2018-08-21 NOTE — Progress Notes (Signed)
Whitney Velez reviewed with and pt seen by Dr. Delton Coombes and pt approved for Taxol infusion today per MD                                                            Whitney Velez tolerated Taxol infusion well without complaints or incident. VSS upon discharge. Pt discharged self ambulatory in satisfactory condition accompanied by family member

## 2018-08-21 NOTE — Progress Notes (Signed)
[address], [place] [address]   CLINIC:  Medical Oncology/Hematology  PCP:  Mikey Kirschner, MD [place] [address] [phone]   REASON FOR VISIT:  Follow-up for right breast cancer, ER+/PR+/HER2-  CURRENT THERAPY: Paclitaxel weekly  BRIEF ONCOLOGIC HISTORY:    Breast cancer, right ([place])   05/12/2018 Initial Diagnosis    Breast cancer, right ([place])    06/02/2018 -  Chemotherapy    The patient had DOXOrubicin (ADRIAMYCIN) chemo injection 110 mg, 60 mg/m2 = 110 mg, Intravenous,  Once, 4 of 4 cycles Administration: 110 mg (06/05/2018), 110 mg (06/19/2018), 110 mg (07/07/2018), 110 mg (07/21/2018) palonosetron (ALOXI) injection 0.25 mg, 0.25 mg, Intravenous,  Once, 4 of 4 cycles Administration: 0.25 mg (06/05/2018), 0.25 mg (06/19/2018), 0.25 mg (07/07/2018), 0.25 mg (07/21/2018) pegfilgrastim-cbqv (UDENYCA) injection 6 mg, 6 mg, Subcutaneous, Once, 4 of 4 cycles Administration: 6 mg (06/06/2018), 6 mg (06/20/2018), 6 mg (07/08/2018), 6 mg (07/22/2018) cyclophosphamide (CYTOXAN) 1,100 mg in sodium chloride 0.9 % 250 mL chemo infusion, 600 mg/m2 = 1,100 mg, Intravenous,  Once, 4 of 4 cycles Administration: 1,100 mg (06/05/2018), 1,100 mg (06/19/2018), 1,100 mg (07/07/2018), 1,100 mg (07/21/2018) PACLitaxel (TAXOL) 144 mg in sodium chloride 0.9 % 250 mL chemo infusion (</= 52m/m2), 80 mg/m2 = 144 mg, Intravenous,  Once, 3 of 12 cycles Administration: 144 mg (08/07/2018), 144 mg (08/14/2018), 144 mg (08/21/2018) fosaprepitant (EMEND) 150 mg, dexamethasone (DECADRON) 12 mg in sodium chloride 0.9 % 145 mL IVPB, , Intravenous,  Once, 4 of 4 cycles Administration:  (06/05/2018),  (06/19/2018),  (07/07/2018),  (07/21/2018)  for chemotherapy treatment.       INTERVAL HISTORY:  [name]89y.o. female returns for routine follow-up for right breast cancer and consideration for week 3 of chemotherapy. Patient here today with her daughter. She states her  energy and appetite are improved since starting the new chemo regime. She is starting be perform all her own ADLs and is gaining strength daily. She is drinking ensure 2 cans daily to help maintain her weight. Patient is is overall ready for her next cycle of chemo today. Patient denies any numbness or tingling to her hands or feet. Denies any nausea, vomiting, or diarrhea. Denies any headaches.      REVIEW OF SYSTEMS:  Review of Systems  All other systems reviewed and are negative.    PAST MEDICAL/SURGICAL HISTORY:  Past Medical History:  Diagnosis Date  . Asthma   . Hypertension    Past Surgical History:  Procedure Laterality Date  . ABDOMINAL HYSTERECTOMY    . PORTACATH PLACEMENT Right 06/04/2018   Procedure: INSERTION PORT-A-CATH;  Surgeon: JAviva Signs MD;  Location: [facility];  Service: General;  Laterality: Right;     SOCIAL HISTORY:  Social History   Socioeconomic History  . Marital status: Widowed    Spouse name: Not on file  . Number of children: Not on file  . Years of education: Not on file  . Highest education level: Not on file  Occupational History  . Not on file  Social Needs  . Financial resource strain: Not on file  . Food insecurity:    Worry: Not on file    Inability: Not on file  . Transportation needs:    Medical: Not on file    Non-medical: Not on file  Tobacco Use  . Smoking status: Never Smoker  . Smokeless tobacco: Never Used  Substance and Sexual Activity  .  Alcohol use: Never    Frequency: Never  . Drug use: Never  . Sexual activity: Not on file  Lifestyle  . Physical activity:    Days per week: Not on file    Minutes per session: Not on file  . Stress: Not on file  Relationships  . Social connections:    Talks on phone: Not on file    Gets together: Not on file    Attends religious service: Not on file    Active member of club or organization: Not on file    Attends meetings of clubs or organizations: Not on file     Relationship status: Not on file  . Intimate partner violence:    Fear of current or ex partner: Not on file    Emotionally abused: Not on file    Physically abused: Not on file    Forced sexual activity: Not on file  Other Topics Concern  . Not on file  Social History Narrative  . Not on file    FAMILY HISTORY:  History reviewed. No pertinent family history.  CURRENT MEDICATIONS:  Outpatient Encounter Medications as of 08/21/2018  Medication Sig  . albuterol (PROAIR HFA) 108 (90 Base) MCG/ACT inhaler INHALE TWO PUFFS INTO THE LUNGS EVERY SIX HOURS AS NEEDED FOR WHEEZING  . albuterol (PROVENTIL) (2.5 MG/3ML) 0.083% nebulizer solution Use via neb q 4 hrs prn wheezing (Patient taking differently: Take 2.5 mg by nebulization every 4 (four) hours as needed for wheezing or shortness of breath. Use via neb q 4 hrs prn wheezing)  . amLODipine (NORVASC) 10 MG tablet Take 1 tablet (10 mg total) by mouth daily.  Marland Kitchen aspirin 81 MG tablet Take 81 mg by mouth daily.  . Cholecalciferol (VITAMIN D-3) 1000 UNITS CAPS Take 1 capsule by mouth daily.   . Flaxseed, Linseed, (FLAXSEED OIL) 1000 MG CAPS Take 1 capsule by mouth daily.   . hydrochlorothiazide (HYDRODIURIL) 25 MG tablet Take 1 tablet (25 mg total) by mouth daily.  Marland Kitchen lidocaine-prilocaine (EMLA) cream Apply to affected area once  . metFORMIN (GLUCOPHAGE) 500 MG tablet Take 1 tablet (500 mg total) by mouth daily with breakfast.  . montelukast (SINGULAIR) 10 MG tablet TAKE ONE TABLET BY MOUTH EVERY NIGHT AT BEDTIME  . nabumetone (RELAFEN) 750 MG tablet Take 1 tablet (750 mg total) by mouth 2 (two) times daily. With food (Patient taking differently: Take 750 mg by mouth 2 (two) times daily as needed for mild pain. With food)  . potassium chloride SA (K-DUR,KLOR-CON) 20 MEQ tablet Take 1 tablet (20 mEq total) by mouth 2 (two) times daily.  . prochlorperazine (COMPAZINE) 10 MG tablet Take 1 tablet (10 mg total) by mouth every 6 (six) hours as needed  (Nausea or vomiting).  . traMADol (ULTRAM) 50 MG tablet Take 1 tablet (50 mg total) by mouth every 6 (six) hours as needed.   No facility-administered encounter medications on file as of 08/21/2018.     ALLERGIES:  Allergies  Allergen Reactions  . Penicillins Other (See Comments)    Nausea, vomiting, dizziness Has patient had a PCN reaction causing immediate rash, facial/tongue/throat swelling, SOB or lightheadedness with hypotension: no Has patient had a PCN reaction causing severe rash involving mucus membranes or skin necrosis: no Has patient had a PCN reaction that required hospitalization: no Has patient had a PCN reaction occurring within the last 10 years: no If all of the above answers are "NO", then may proceed with Cephalosporin use.   Marland Kitchen  Percocet [Oxycodone-Acetaminophen]     Nausea, vomiting, diarrhea  . Ranitidine Itching     PHYSICAL EXAM:  ECOG Performance status: 1  Vitals:   08/21/18 1000  BP: (!) 125/58  Pulse: 66  Resp: 18  Temp: 98.9 F (37.2 C)  SpO2: 100%   Filed Weights   08/21/18 1000  Weight: 157 lb 4.8 oz (71.4 kg)    Physical Exam  Constitutional: She is oriented to person, place, and time. She appears well-developed and well-nourished.  Cardiovascular: Normal rate, regular rhythm and normal heart sounds.  Pulmonary/Chest: Effort normal and breath sounds normal.  Abdominal: Soft.  Neurological: She is alert and oriented to person, place, and time.  Skin: Skin is warm and dry.  Breast exam: There is a 5 cm mass in the right breast upper quadrant, freely mobile.  Its much softer compared to prior exam.  Nipple is inverted but has improved.  Lymph node size in the axilla has also improved.  Left breast has a mass in the center which is stable.  This was previously biopsied as DCIS.   LABORATORY DATA:  I have reviewed the labs as listed.  CBC    Component Value Date/Time   WBC 5.7 08/21/2018 0908   RBC 3.05 (L) 08/21/2018 0908   HGB 9.2  (L) 08/21/2018 0908   HGB 12.4 02/07/2017 1109   HCT 27.7 (L) 08/21/2018 0908   HCT 38.2 02/07/2017 1109   PLT 270 08/21/2018 0908   PLT 335 02/07/2017 1109   MCV 90.8 08/21/2018 0908   MCV 86 02/07/2017 1109   MCH 30.2 08/21/2018 0908   MCHC 33.2 08/21/2018 0908   RDW 22.5 (H) 08/21/2018 0908   RDW 15.1 02/07/2017 1109   LYMPHSABS 1.0 08/21/2018 0908   LYMPHSABS 3.0 02/07/2017 1109   MONOABS 0.1 08/21/2018 0908   EOSABS 0.4 08/21/2018 0908   EOSABS 1.1 (H) 02/07/2017 1109   BASOSABS 0.1 08/21/2018 0908   BASOSABS 0.0 02/07/2017 1109   CMP Latest Ref Rng & Units 08/21/2018 08/14/2018 08/07/2018  Glucose 70 - 99 mg/dL 114(H) 143(H) 147(H)  BUN 8 - 23 mg/dL _0 Creatinine 0.44 - 1.00 mg/dL 0.87 0.81 0.80  Sodium 135 - 145 mmol/L 138 136 136  Potassium 3.5 - 5.1 mmol/L 4.1 3.8 3.8  Chloride 98 - 111 mmol/L 102 103 104  CO2 22 - 32 mmol/L _1 Calcium 8.9 - 10.3 mg/dL 9.6 9.4 9.2  Total Protein 6.5 - 8.1 g/dL 7.1 7.3 7.2  Total Bilirubin 0.3 - 1.2 mg/dL 0.6 0.7 0.4  Alkaline Phos 38 - 126 U/L 51 55 69  AST 15 - 41 U/L _2 ALT 0 - 44 U/L _3 ASSESSMENT & PLAN:   Breast cancer, right ([place]) 1.  Locally advanced (stage IIIa, T3N1) right breast cancer: - Mammogram on 03/28/2018 was BI-RADS Category 0.  Mammogram and ultrasound on 04/22/2018 shows 5.9 x 5 x 6 cm mass extending in the upper quadrant of the right breast and right axillary lymph node measuring 2.8 x 1.6 x 1.7 cm.  Left breast has retroareolar mass.  Patient did not have mammogram in the last 20 years. - Biopsy on 04/29/2018 of the right breast mass consistent with IDC, grade 2-3, lymph node biopsy positive for metastatic disease, ER/PR positive, HER-2 negative, Ki-67 of 5% -Seen by Dr. Arnoldo Morale who recommended right modified radical mastectomy and left simple mastectomy. - Bone scan  showed  increased uptake in the right greater trochanter and right sacroiliac joint.  CT scan of the chest abdomen  and pelvis did not show any evidence of metastatic disease.  The uptake in the right greater trochanter was thought to be due to enthesopathic change.  Uptake in the right sacroiliac joint was likely from degenerative changes at the right L5-S1 joint. - Due to advanced nature of her breast cancer, we have recommended neoadjuvant chemotherapy with dose dense AC followed by weekly paclitaxel for 12 weeks. - 4 cycles of dose dense AC from 06/05/2018 through 07/21/2018. - Weekly paclitaxel started on 08/07/2018.  She is tolerating it very well.  Today she will proceed with week 3 of paclitaxel.  She will be seen back in 2 weeks for follow-up.  We reviewed her blood counts today. - Right breast examination today measures some mass about 5 cm in the upper quadrant, freely mobile, softer.  Nipple is inverted but improved.  Very small lymph node in the right axilla palpable.  Left breast mass in the center is stable.  2.  Left breast DCIS: -Biopsy on 04/29/2018 of the left breast shows DCIS, ER/PR positive.  Left axillary lymph node biopsy was negative for malignancy.  3.  Hypokalemia: -She is continuing potassium supplements.  Potassium is normal today.      Orders placed this encounter:  Orders Placed This Encounter  Procedures  . CBC with Differential/Platelet  . Comprehensive metabolic panel      Derek Jack, MD [place] [phone]

## 2018-08-21 NOTE — Patient Instructions (Signed)
[facility] at [facility] Discharge Instructions  Follow up in 2 weeks with labs    Thank you for choosing [facility] at [facility] to provide your oncology and hematology care.  To afford each patient quality time with our provider, please arrive at least 15 minutes before your scheduled appointment time.   If you have a lab appointment with the Cancer Center please come in thru the  Main Entrance and check in at the main information desk  You need to re-schedule your appointment should you arrive 10 or more minutes late.  We strive to give you quality time with our providers, and arriving late affects you and other patients whose appointments are after yours.  Also, if you no show three or more times for appointments you may be dismissed from the clinic at the providers discretion.     Again, thank you for choosing [facility].  Our hope is that these requests will decrease the amount of time that you wait before being seen by our physicians.       _____________________________________________________________  Should you have questions after your visit to [facility], please contact our office at [phone] between the hours of 8:00 a.m. and 4:30 p.m.  Voicemails left after 4:00 p.m. will not be returned until the following business day.  For prescription refill requests, have your pharmacy contact our office and allow 72 hours.    Cancer Center Support Programs:   > Cancer Support Group  2nd Tuesday of the month 1pm-2pm, Journey Room    

## 2018-08-21 NOTE — Assessment & Plan Note (Signed)
1.  Locally advanced (stage IIIa, T3N1) right breast cancer: - Mammogram on 03/28/2018 was BI-RADS Category 0.  Mammogram and ultrasound on 04/22/2018 shows 5.9 x 5 x 6 cm mass extending in the upper quadrant of the right breast and right axillary lymph node measuring 2.8 x 1.6 x 1.7 cm.  Left breast has retroareolar mass.  Patient did not have mammogram in the last 20 years. - Biopsy on 04/29/2018 of the right breast mass consistent with IDC, grade 2-3, lymph node biopsy positive for metastatic disease, ER/PR positive, HER-2 negative, Ki-67 of 5% -Seen by Dr. Arnoldo Morale who recommended right modified radical mastectomy and left simple mastectomy. - Bone scan showed  increased uptake in the right greater trochanter and right sacroiliac joint.  CT scan of the chest abdomen and pelvis did not show any evidence of metastatic disease.  The uptake in the right greater trochanter was thought to be due to enthesopathic change.  Uptake in the right sacroiliac joint was likely from degenerative changes at the right L5-S1 joint. - Due to advanced nature of her breast cancer, we have recommended neoadjuvant chemotherapy with dose dense AC followed by weekly paclitaxel for 12 weeks. - 4 cycles of dose dense AC from 06/05/2018 through 07/21/2018. - Weekly paclitaxel started on 08/07/2018.  She is tolerating it very well.  Today she will proceed with week 3 of paclitaxel.  She will be seen back in 2 weeks for follow-up.  We reviewed her blood counts today. - Right breast examination today measures some mass about 5 cm in the upper quadrant, freely mobile, softer.  Nipple is inverted but improved.  Very small lymph node in the right axilla palpable.  Left breast mass in the center is stable.  2.  Left breast DCIS: -Biopsy on 04/29/2018 of the left breast shows DCIS, ER/PR positive.  Left axillary lymph node biopsy was negative for malignancy.  3.  Hypokalemia: -She is continuing potassium supplements.  Potassium is normal  today.

## 2018-08-28 ENCOUNTER — Inpatient Hospital Stay (HOSPITAL_COMMUNITY): Payer: Medicare Other

## 2018-08-28 ENCOUNTER — Encounter (HOSPITAL_COMMUNITY): Payer: Self-pay

## 2018-08-28 VITALS — BP 104/55 | HR 75 | Temp 98.2°F | Resp 18 | Wt 157.4 lb

## 2018-08-28 DIAGNOSIS — D0512 Intraductal carcinoma in situ of left breast: Secondary | ICD-10-CM | POA: Diagnosis not present

## 2018-08-28 DIAGNOSIS — C50811 Malignant neoplasm of overlapping sites of right female breast: Secondary | ICD-10-CM | POA: Diagnosis not present

## 2018-08-28 DIAGNOSIS — Z170 Estrogen receptor positive status [ER+]: Principal | ICD-10-CM

## 2018-08-28 DIAGNOSIS — Z5111 Encounter for antineoplastic chemotherapy: Secondary | ICD-10-CM | POA: Diagnosis not present

## 2018-08-28 DIAGNOSIS — E876 Hypokalemia: Secondary | ICD-10-CM | POA: Diagnosis not present

## 2018-08-28 LAB — CBC WITH DIFFERENTIAL/PLATELET
Basophils Absolute: 0 10*3/uL (ref 0.0–0.1)
Basophils Relative: 1 %
Eosinophils Absolute: 0.3 10*3/uL (ref 0.0–0.7)
Eosinophils Relative: 6 %
HEMATOCRIT: 28.9 % — AB (ref 36.0–46.0)
HEMOGLOBIN: 9.6 g/dL — AB (ref 12.0–15.0)
LYMPHS PCT: 9 %
Lymphs Abs: 0.5 10*3/uL — ABNORMAL LOW (ref 0.7–4.0)
MCH: 30.7 pg (ref 26.0–34.0)
MCHC: 33.2 g/dL (ref 30.0–36.0)
MCV: 92.3 fL (ref 78.0–100.0)
MONO ABS: 0.4 10*3/uL (ref 0.1–1.0)
MONOS PCT: 7 %
NEUTROS ABS: 4.2 10*3/uL (ref 1.7–7.7)
Neutrophils Relative %: 77 %
Platelets: 300 10*3/uL (ref 150–400)
RBC: 3.13 MIL/uL — ABNORMAL LOW (ref 3.87–5.11)
RDW: 22.7 % — ABNORMAL HIGH (ref 11.5–15.5)
WBC: 5.3 10*3/uL (ref 4.0–10.5)

## 2018-08-28 LAB — COMPREHENSIVE METABOLIC PANEL
ALBUMIN: 3.8 g/dL (ref 3.5–5.0)
ALK PHOS: 51 U/L (ref 38–126)
ALT: 19 U/L (ref 0–44)
ANION GAP: 9 (ref 5–15)
AST: 25 U/L (ref 15–41)
BILIRUBIN TOTAL: 0.8 mg/dL (ref 0.3–1.2)
BUN: 12 mg/dL (ref 8–23)
CALCIUM: 9.8 mg/dL (ref 8.9–10.3)
CO2: 25 mmol/L (ref 22–32)
Chloride: 101 mmol/L (ref 98–111)
Creatinine, Ser: 0.96 mg/dL (ref 0.44–1.00)
GFR, EST NON AFRICAN AMERICAN: 59 mL/min — AB (ref >60)
GLUCOSE: 128 mg/dL — AB (ref 70–99)
Potassium: 4 mmol/L (ref 3.5–5.1)
Sodium: 135 mmol/L (ref 135–145)
TOTAL PROTEIN: 7.4 g/dL (ref 6.5–8.1)

## 2018-08-28 MED ORDER — DIPHENHYDRAMINE HCL 50 MG/ML IJ SOLN
50.0000 mg | Freq: Once | INTRAMUSCULAR | Status: AC
  Administered 2018-08-28: 50 mg via INTRAVENOUS
  Filled 2018-08-28: qty 1

## 2018-08-28 MED ORDER — SODIUM CHLORIDE 0.9 % IV SOLN
80.0000 mg/m2 | Freq: Once | INTRAVENOUS | Status: AC
  Administered 2018-08-28: 144 mg via INTRAVENOUS
  Filled 2018-08-28: qty 24

## 2018-08-28 MED ORDER — HEPARIN SOD (PORK) LOCK FLUSH 100 UNIT/ML IV SOLN
500.0000 [IU] | Freq: Once | INTRAVENOUS | Status: AC | PRN
  Administered 2018-08-28: 500 [IU]

## 2018-08-28 MED ORDER — FAMOTIDINE IN NACL 20-0.9 MG/50ML-% IV SOLN
20.0000 mg | Freq: Once | INTRAVENOUS | Status: AC
  Administered 2018-08-28: 20 mg via INTRAVENOUS

## 2018-08-28 MED ORDER — SODIUM CHLORIDE 0.9% FLUSH
10.0000 mL | INTRAVENOUS | Status: DC | PRN
  Administered 2018-08-28: 10 mL
  Filled 2018-08-28: qty 10

## 2018-08-28 MED ORDER — FAMOTIDINE IN NACL 20-0.9 MG/50ML-% IV SOLN
INTRAVENOUS | Status: AC
  Filled 2018-08-28: qty 50

## 2018-08-28 MED ORDER — SODIUM CHLORIDE 0.9 % IV SOLN
Freq: Once | INTRAVENOUS | Status: AC
  Administered 2018-08-28: 10:00:00 via INTRAVENOUS

## 2018-08-28 MED ORDER — SODIUM CHLORIDE 0.9 % IV SOLN
20.0000 mg | Freq: Once | INTRAVENOUS | Status: AC
  Administered 2018-08-28: 20 mg via INTRAVENOUS
  Filled 2018-08-28: qty 2

## 2018-08-28 NOTE — Patient Instructions (Signed)
[facility] Discharge Instructions for Patients Receiving Chemotherapy  Today you received the following chemotherapy agents taxol.     If you develop nausea and vomiting that is not controlled by your nausea medication, call the clinic.   BELOW ARE SYMPTOMS THAT SHOULD BE REPORTED IMMEDIATELY:  *FEVER GREATER THAN 100.5 F  *CHILLS WITH OR WITHOUT FEVER  NAUSEA AND VOMITING THAT IS NOT CONTROLLED WITH YOUR NAUSEA MEDICATION  *UNUSUAL SHORTNESS OF BREATH  *UNUSUAL BRUISING OR BLEEDING  TENDERNESS IN MOUTH AND THROAT WITH OR WITHOUT PRESENCE OF ULCERS  *URINARY PROBLEMS  *BOWEL PROBLEMS  UNUSUAL RASH Items with * indicate a potential emergency and should be followed up as soon as possible.  Feel free to call the clinic should you have any questions or concerns. The clinic phone number is [phone].  Please show the CHEMO ALERT CARD at check-in to the Emergency Department and triage nurse.   

## 2018-08-28 NOTE — Progress Notes (Signed)
Patient tolerated chemotherapy with no complaints voiced.  Port site clean and dry with no bruising or swelling noted.  Good blood return noted with port flush.  Band aid applied.  VSS with discharge and left ambulatory with family with no s/s of distress noted.

## 2018-08-29 ENCOUNTER — Telehealth (HOSPITAL_COMMUNITY): Payer: Self-pay

## 2018-08-29 NOTE — Telephone Encounter (Signed)
Nutrition  Patient identified on Malnutrition Screening report for weight loss and poor po intake.  Called and left message on voicemail to return RD's call.  Cephus Tupy B. Zenia Resides, [place], [place] Registered Dietitian [phone] (pager)

## 2018-09-04 ENCOUNTER — Encounter (HOSPITAL_COMMUNITY): Payer: Self-pay | Admitting: Hematology

## 2018-09-04 ENCOUNTER — Inpatient Hospital Stay (HOSPITAL_COMMUNITY): Payer: Medicare Other

## 2018-09-04 ENCOUNTER — Inpatient Hospital Stay (HOSPITAL_BASED_OUTPATIENT_CLINIC_OR_DEPARTMENT_OTHER): Payer: Medicare Other | Admitting: Hematology

## 2018-09-04 ENCOUNTER — Inpatient Hospital Stay (HOSPITAL_COMMUNITY): Payer: Medicare Other | Attending: Hematology

## 2018-09-04 VITALS — BP 133/54 | HR 70 | Temp 98.0°F | Resp 14 | Wt 157.2 lb

## 2018-09-04 VITALS — BP 105/55 | HR 70 | Temp 98.2°F | Resp 18

## 2018-09-04 DIAGNOSIS — Z5111 Encounter for antineoplastic chemotherapy: Secondary | ICD-10-CM | POA: Insufficient documentation

## 2018-09-04 DIAGNOSIS — C773 Secondary and unspecified malignant neoplasm of axilla and upper limb lymph nodes: Secondary | ICD-10-CM

## 2018-09-04 DIAGNOSIS — Z79899 Other long term (current) drug therapy: Secondary | ICD-10-CM | POA: Diagnosis not present

## 2018-09-04 DIAGNOSIS — D0512 Intraductal carcinoma in situ of left breast: Secondary | ICD-10-CM

## 2018-09-04 DIAGNOSIS — Z170 Estrogen receptor positive status [ER+]: Secondary | ICD-10-CM | POA: Insufficient documentation

## 2018-09-04 DIAGNOSIS — C50811 Malignant neoplasm of overlapping sites of right female breast: Secondary | ICD-10-CM | POA: Insufficient documentation

## 2018-09-04 DIAGNOSIS — E876 Hypokalemia: Secondary | ICD-10-CM

## 2018-09-04 LAB — CBC WITH DIFFERENTIAL/PLATELET
BASOS ABS: 0 10*3/uL (ref 0.0–0.1)
BASOS PCT: 1 %
Eosinophils Absolute: 0.3 10*3/uL (ref 0.0–0.7)
Eosinophils Relative: 6 %
HCT: 27.4 % — ABNORMAL LOW (ref 36.0–46.0)
HEMOGLOBIN: 9.2 g/dL — AB (ref 12.0–15.0)
Lymphocytes Relative: 10 %
Lymphs Abs: 0.4 10*3/uL — ABNORMAL LOW (ref 0.7–4.0)
MCH: 31.7 pg (ref 26.0–34.0)
MCHC: 33.6 g/dL (ref 30.0–36.0)
MCV: 94.5 fL (ref 78.0–100.0)
MONO ABS: 0.4 10*3/uL (ref 0.1–1.0)
Monocytes Relative: 9 %
NEUTROS ABS: 3.1 10*3/uL (ref 1.7–7.7)
NEUTROS PCT: 74 %
Platelets: 336 10*3/uL (ref 150–400)
RBC: 2.9 MIL/uL — AB (ref 3.87–5.11)
RDW: 22.7 % — ABNORMAL HIGH (ref 11.5–15.5)
WBC: 4.2 10*3/uL (ref 4.0–10.5)

## 2018-09-04 LAB — COMPREHENSIVE METABOLIC PANEL
ALBUMIN: 4 g/dL (ref 3.5–5.0)
ALK PHOS: 51 U/L (ref 38–126)
ALT: 17 U/L (ref 0–44)
ANION GAP: 9 (ref 5–15)
AST: 20 U/L (ref 15–41)
BUN: 13 mg/dL (ref 8–23)
CALCIUM: 9.4 mg/dL (ref 8.9–10.3)
CO2: 25 mmol/L (ref 22–32)
Chloride: 103 mmol/L (ref 98–111)
Creatinine, Ser: 0.86 mg/dL (ref 0.44–1.00)
GFR calc Af Amer: >60 mL/min (ref >60)
GFR calc non Af Amer: >60 mL/min (ref >60)
GLUCOSE: 134 mg/dL — AB (ref 70–99)
Potassium: 4.2 mmol/L (ref 3.5–5.1)
SODIUM: 137 mmol/L (ref 135–145)
Total Bilirubin: 0.5 mg/dL (ref 0.3–1.2)
Total Protein: 7.3 g/dL (ref 6.5–8.1)

## 2018-09-04 MED ORDER — DIPHENHYDRAMINE HCL 50 MG/ML IJ SOLN
INTRAMUSCULAR | Status: AC
  Filled 2018-09-04: qty 1

## 2018-09-04 MED ORDER — DIPHENHYDRAMINE HCL 50 MG/ML IJ SOLN
50.0000 mg | Freq: Once | INTRAMUSCULAR | Status: AC
  Administered 2018-09-04: 50 mg via INTRAVENOUS

## 2018-09-04 MED ORDER — SODIUM CHLORIDE 0.9 % IV SOLN
20.0000 mg | Freq: Once | INTRAVENOUS | Status: AC
  Administered 2018-09-04: 20 mg via INTRAVENOUS
  Filled 2018-09-04: qty 2

## 2018-09-04 MED ORDER — FAMOTIDINE IN NACL 20-0.9 MG/50ML-% IV SOLN
20.0000 mg | Freq: Once | INTRAVENOUS | Status: AC
  Administered 2018-09-04: 20 mg via INTRAVENOUS

## 2018-09-04 MED ORDER — FAMOTIDINE IN NACL 20-0.9 MG/50ML-% IV SOLN
INTRAVENOUS | Status: AC
  Filled 2018-09-04: qty 50

## 2018-09-04 MED ORDER — SODIUM CHLORIDE 0.9 % IV SOLN
Freq: Once | INTRAVENOUS | Status: AC
  Administered 2018-09-04: 10:00:00 via INTRAVENOUS

## 2018-09-04 MED ORDER — HEPARIN SOD (PORK) LOCK FLUSH 100 UNIT/ML IV SOLN
500.0000 [IU] | Freq: Once | INTRAVENOUS | Status: AC | PRN
  Administered 2018-09-04: 500 [IU]

## 2018-09-04 MED ORDER — SODIUM CHLORIDE 0.9 % IV SOLN
80.0000 mg/m2 | Freq: Once | INTRAVENOUS | Status: AC
  Administered 2018-09-04: 144 mg via INTRAVENOUS
  Filled 2018-09-04: qty 24

## 2018-09-04 MED ORDER — SODIUM CHLORIDE 0.9% FLUSH
10.0000 mL | INTRAVENOUS | Status: DC | PRN
  Administered 2018-09-04: 10 mL
  Filled 2018-09-04: qty 10

## 2018-09-04 NOTE — Progress Notes (Signed)
[address] [place], [place] [address]   CLINIC:  Medical Oncology/Hematology  PCP:  Mikey Kirschner, MD [place] [address] [phone]   REASON FOR VISIT: Follow-up for right breast cancer, ER+/PR+/HER2-  CURRENT THERAPY: Paclitaxel weekly  BRIEF ONCOLOGIC HISTORY:    Breast cancer, right ([place])   05/12/2018 Initial Diagnosis    Breast cancer, right ([place])    06/02/2018 -  Chemotherapy    The patient had DOXOrubicin (ADRIAMYCIN) chemo injection 110 mg, 60 mg/m2 = 110 mg, Intravenous,  Once, 4 of 4 cycles Administration: 110 mg (06/05/2018), 110 mg (06/19/2018), 110 mg (07/07/2018), 110 mg (07/21/2018) palonosetron (ALOXI) injection 0.25 mg, 0.25 mg, Intravenous,  Once, 4 of 4 cycles Administration: 0.25 mg (06/05/2018), 0.25 mg (06/19/2018), 0.25 mg (07/07/2018), 0.25 mg (07/21/2018) pegfilgrastim-cbqv (UDENYCA) injection 6 mg, 6 mg, Subcutaneous, Once, 4 of 4 cycles Administration: 6 mg (06/06/2018), 6 mg (06/20/2018), 6 mg (07/08/2018), 6 mg (07/22/2018) cyclophosphamide (CYTOXAN) 1,100 mg in sodium chloride 0.9 % 250 mL chemo infusion, 600 mg/m2 = 1,100 mg, Intravenous,  Once, 4 of 4 cycles Administration: 1,100 mg (06/05/2018), 1,100 mg (06/19/2018), 1,100 mg (07/07/2018), 1,100 mg (07/21/2018) PACLitaxel (TAXOL) 144 mg in sodium chloride 0.9 % 250 mL chemo infusion (</= 89m/m2), 80 mg/m2 = 144 mg, Intravenous,  Once, 5 of 12 cycles Administration: 144 mg (08/07/2018), 144 mg (08/14/2018), 144 mg (08/21/2018), 144 mg (08/28/2018), 144 mg (09/04/2018) fosaprepitant (EMEND) 150 mg, dexamethasone (DECADRON) 12 mg in sodium chloride 0.9 % 145 mL IVPB, , Intravenous,  Once, 4 of 4 cycles Administration:  (06/05/2018),  (06/19/2018),  (07/07/2018),  (07/21/2018)  for chemotherapy treatment.       INTERVAL HISTORY:  Ms. [age]Schram89y.o. female returns for routine follow-up for right breast cancer. Patient is here today with her daughter. She is tolerating  treatment well. She does have occasional dizziness when she bends over to pick up objects. She is trying to remain active and well hydrated. Her appetite is 100% and she is maintaining her weight well. Her energy level is 75%. Patient denies any nausea, vomiting, or diarrhea. Denies any new pains or lumps. Denies any skin rashes or itching.    REVIEW OF SYSTEMS:  Review of Systems  Neurological: Positive for dizziness.  All other systems reviewed and are negative.    PAST MEDICAL/SURGICAL HISTORY:  Past Medical History:  Diagnosis Date  . Asthma   . Hypertension    Past Surgical History:  Procedure Laterality Date  . ABDOMINAL HYSTERECTOMY    . PORTACATH PLACEMENT Right 06/04/2018   Procedure: INSERTION PORT-A-CATH;  Surgeon: JAviva Signs MD;  Location: [facility];  Service: General;  Laterality: Right;     SOCIAL HISTORY:  Social History   Socioeconomic History  . Marital status: Widowed    Spouse name: Not on file  . Number of children: Not on file  . Years of education: Not on file  . Highest education level: Not on file  Occupational History  . Not on file  Social Needs  . Financial resource strain: Not on file  . Food insecurity:    Worry: Not on file    Inability: Not on file  . Transportation needs:    Medical: Not on file    Non-medical: Not on file  Tobacco Use  . Smoking status: Never Smoker  . Smokeless tobacco: Never Used  Substance and Sexual Activity  . Alcohol use: Never    Frequency: Never  .  Drug use: Never  . Sexual activity: Not on file  Lifestyle  . Physical activity:    Days per week: Not on file    Minutes per session: Not on file  . Stress: Not on file  Relationships  . Social connections:    Talks on phone: Not on file    Gets together: Not on file    Attends religious service: Not on file    Active member of club or organization: Not on file    Attends meetings of clubs or organizations: Not on file    Relationship status: Not on  file  . Intimate partner violence:    Fear of current or ex partner: Not on file    Emotionally abused: Not on file    Physically abused: Not on file    Forced sexual activity: Not on file  Other Topics Concern  . Not on file  Social History Narrative  . Not on file    FAMILY HISTORY:  History reviewed. No pertinent family history.  CURRENT MEDICATIONS:  Outpatient Encounter Medications as of 09/04/2018  Medication Sig  . albuterol (PROAIR HFA) 108 (90 Base) MCG/ACT inhaler INHALE TWO PUFFS INTO THE LUNGS EVERY SIX HOURS AS NEEDED FOR WHEEZING  . albuterol (PROVENTIL) (2.5 MG/3ML) 0.083% nebulizer solution Use via neb q 4 hrs prn wheezing (Patient taking differently: Take 2.5 mg by nebulization every 4 (four) hours as needed for wheezing or shortness of breath. Use via neb q 4 hrs prn wheezing)  . amLODipine (NORVASC) 10 MG tablet Take 1 tablet (10 mg total) by mouth daily.  Marland Kitchen aspirin 81 MG tablet Take 81 mg by mouth daily.  . Cholecalciferol (VITAMIN D-3) 1000 UNITS CAPS Take 1 capsule by mouth daily.   . Flaxseed, Linseed, (FLAXSEED OIL) 1000 MG CAPS Take 1 capsule by mouth daily.   . hydrochlorothiazide (HYDRODIURIL) 25 MG tablet Take 1 tablet (25 mg total) by mouth daily.  Marland Kitchen lidocaine-prilocaine (EMLA) cream Apply to affected area once  . metFORMIN (GLUCOPHAGE) 500 MG tablet Take 1 tablet (500 mg total) by mouth daily with breakfast.  . montelukast (SINGULAIR) 10 MG tablet TAKE ONE TABLET BY MOUTH EVERY NIGHT AT BEDTIME  . nabumetone (RELAFEN) 750 MG tablet Take 1 tablet (750 mg total) by mouth 2 (two) times daily. With food (Patient taking differently: Take 750 mg by mouth 2 (two) times daily as needed for mild pain. With food)  . potassium chloride SA (K-DUR,KLOR-CON) 20 MEQ tablet Take 1 tablet (20 mEq total) by mouth 2 (two) times daily.  . prochlorperazine (COMPAZINE) 10 MG tablet Take 1 tablet (10 mg total) by mouth every 6 (six) hours as needed (Nausea or vomiting).  .  traMADol (ULTRAM) 50 MG tablet Take 1 tablet (50 mg total) by mouth every 6 (six) hours as needed.   No facility-administered encounter medications on file as of 09/04/2018.     ALLERGIES:  Allergies  Allergen Reactions  . Penicillins Other (See Comments)    Nausea, vomiting, dizziness Has patient had a PCN reaction causing immediate rash, facial/tongue/throat swelling, SOB or lightheadedness with hypotension: no Has patient had a PCN reaction causing severe rash involving mucus membranes or skin necrosis: no Has patient had a PCN reaction that required hospitalization: no Has patient had a PCN reaction occurring within the last 10 years: no If all of the above answers are "NO", then may proceed with Cephalosporin use.   Marland Kitchen Percocet [Oxycodone-Acetaminophen]     Nausea, vomiting, diarrhea  .  Ranitidine Itching     PHYSICAL EXAM:  ECOG Performance status: 1  Vitals:   09/04/18 0836  BP: (!) 133/54  Pulse: 70  Resp: 14  Temp: 98 F (36.7 C)  SpO2: 99%   Filed Weights   09/04/18 0836  Weight: 157 lb 3.2 oz (71.3 kg)    Physical Exam  Constitutional: She is oriented to person, place, and time. She appears well-developed and well-nourished.  Cardiovascular: Normal rate, regular rhythm and normal heart sounds.  Pulmonary/Chest: Effort normal and breath sounds normal.  Musculoskeletal: Normal range of motion.  Neurological: She is alert and oriented to person, place, and time.  Skin: Skin is warm and dry.  Psychiatric: She has a normal mood and affect. Her behavior is normal. Judgment and thought content normal.     LABORATORY DATA:  I have reviewed the labs as listed.  CBC    Component Value Date/Time   WBC 4.2 09/04/2018 0819   RBC 2.90 (L) 09/04/2018 0819   HGB 9.2 (L) 09/04/2018 0819   HGB 12.4 02/07/2017 1109   HCT 27.4 (L) 09/04/2018 0819   HCT 38.2 02/07/2017 1109   PLT 336 09/04/2018 0819   PLT 335 02/07/2017 1109   MCV 94.5 09/04/2018 0819   MCV 86  02/07/2017 1109   MCH 31.7 09/04/2018 0819   MCHC 33.6 09/04/2018 0819   RDW 22.7 (H) 09/04/2018 0819   RDW 15.1 02/07/2017 1109   LYMPHSABS 0.4 (L) 09/04/2018 0819   LYMPHSABS 3.0 02/07/2017 1109   MONOABS 0.4 09/04/2018 0819   EOSABS 0.3 09/04/2018 0819   EOSABS 1.1 (H) 02/07/2017 1109   BASOSABS 0.0 09/04/2018 0819   BASOSABS 0.0 02/07/2017 1109   CMP Latest Ref Rng & Units 09/04/2018 08/28/2018 08/21/2018  Glucose 70 - 99 mg/dL 134(H) 128(H) 114(H)  BUN 8 - 23 mg/dL _0 Creatinine 0.44 - 1.00 mg/dL 0.86 0.96 0.87  Sodium 135 - 145 mmol/L 137 135 138  Potassium 3.5 - 5.1 mmol/L 4.2 4.0 4.1  Chloride 98 - 111 mmol/L 103 101 102  CO2 22 - 32 mmol/L _1 Calcium 8.9 - 10.3 mg/dL 9.4 9.8 9.6  Total Protein 6.5 - 8.1 g/dL 7.3 7.4 7.1  Total Bilirubin 0.3 - 1.2 mg/dL 0.5 0.8 0.6  Alkaline Phos 38 - 126 U/L 51 51 51  AST 15 - 41 U/L _2 ALT 0 - 44 U/L _3 ASSESSMENT & PLAN:   Breast cancer, right ([place]) 1.  Locally advanced (stage IIIa, T3N1) right breast cancer: - Mammogram on 03/28/2018 was BI-RADS Category 0.  Mammogram and ultrasound on 04/22/2018 shows 5.9 x 5 x 6 cm mass extending in the upper quadrant of the right breast and right axillary lymph node measuring 2.8 x 1.6 x 1.7 cm.  Left breast has retroareolar mass.  Patient did not have mammogram in the last 20 years. - Biopsy on 04/29/2018 of the right breast mass consistent with IDC, grade 2-3, lymph node biopsy positive for metastatic disease, ER/PR positive, HER-2 negative, Ki-67 of 5% -Seen by Dr. Arnoldo Morale who recommended right modified radical mastectomy and left simple mastectomy. - Bone scan showed  increased uptake in the right greater trochanter and right sacroiliac joint.  CT scan of the chest abdomen and pelvis did not show any evidence of metastatic disease.  The uptake in the right greater trochanter was thought to be due to enthesopathic change.  Uptake in the right sacroiliac joint was  likely from degenerative changes at the right L5-S1 joint. - Due to advanced nature of her breast cancer, we have recommended neoadjuvant chemotherapy with dose dense AC followed by weekly paclitaxel for 12 weeks. - 4 cycles of dose dense AC from 06/05/2018 through 07/21/2018. - Weekly paclitaxel started on 08/07/2018.  - Last breast exam on 08/21/2018 measures mass 5 cm in the upper quadrant, freely mobile and softer.  Nipple inversion has also improved.  There is a very small lymph node in the right axilla which is also freely mobile palpable.  Left breast mass in the center is stable. - She will proceed with week 5 of paclitaxel today.  She is tolerating it very well.  I have reviewed her blood counts.  I will see her back in 3 to 4 weeks.  We will do another breast exam at that time.  2.  Left breast DCIS: -Biopsy on 04/29/2018 of the left breast shows DCIS, ER/PR positive.  Left axillary lymph node biopsy was negative for malignancy.  3.  Hypokalemia: -Potassium is normal today.  She will continue potassium supplements.      Orders placed this encounter:  Orders Placed This Encounter  Procedures  . CBC with Differential/Platelet  . Comprehensive metabolic panel  . CBC with Differential/Platelet  . Comprehensive metabolic panel      Derek Jack, MD [place] [phone]

## 2018-09-04 NOTE — Progress Notes (Signed)
Patient seen by Dr. Delton Coombes with lab review and ok to treat today.   Patient tolerated chemotherapy with no complaints voiced. Good blood return noted before and after administration of therapy.  Port site clean and dry with no bruising or swelling noted at site.  Band aid applied.  VSS with discharge and left ambulatory with family with no s/s of distress noted.

## 2018-09-04 NOTE — Patient Instructions (Signed)
[facility] Discharge Instructions for Patients Receiving Chemotherapy  Today you received the following chemotherapy agents taxol.     If you develop nausea and vomiting that is not controlled by your nausea medication, call the clinic.   BELOW ARE SYMPTOMS THAT SHOULD BE REPORTED IMMEDIATELY:  *FEVER GREATER THAN 100.5 F  *CHILLS WITH OR WITHOUT FEVER  NAUSEA AND VOMITING THAT IS NOT CONTROLLED WITH YOUR NAUSEA MEDICATION  *UNUSUAL SHORTNESS OF BREATH  *UNUSUAL BRUISING OR BLEEDING  TENDERNESS IN MOUTH AND THROAT WITH OR WITHOUT PRESENCE OF ULCERS  *URINARY PROBLEMS  *BOWEL PROBLEMS  UNUSUAL RASH Items with * indicate a potential emergency and should be followed up as soon as possible.  Feel free to call the clinic should you have any questions or concerns. The clinic phone number is [phone].  Please show the CHEMO ALERT CARD at check-in to the Emergency Department and triage nurse.   

## 2018-09-04 NOTE — Patient Instructions (Addendum)
[place] at [facility] Discharge Instructions  You saw Dr. Delton Coombes today. Continue your normal treatment cycle we will see you back in 3 weeks with labs.   Thank you for choosing [place] at [facility] to provide your oncology and hematology care.  To afford each patient quality time with our provider, please arrive at least 15 minutes before your scheduled appointment time.   If you have a lab appointment with the [facility] please come in thru the  Main Entrance and check in at the main information desk  You need to re-schedule your appointment should you arrive 10 or more minutes late.  We strive to give you quality time with our providers, and arriving late affects you and other patients whose appointments are after yours.  Also, if you no show three or more times for appointments you may be dismissed from the clinic at the providers discretion.     Again, thank you for choosing [facility].  Our hope is that these requests will decrease the amount of time that you wait before being seen by our physicians.       _____________________________________________________________  Should you have questions after your visit to [facility], please contact our office at [phone] between the hours of 8:00 a.m. and 4:30 p.m.  Voicemails left after 4:00 p.m. will not be returned until the following business day.  For prescription refill requests, have your pharmacy contact our office and allow 72 hours.    Cancer Center Support Programs:   > Cancer Support Group  2nd Tuesday of the month 1pm-2pm, Journey Room

## 2018-09-04 NOTE — Assessment & Plan Note (Signed)
1.  Locally advanced (stage IIIa, T3N1) right breast cancer: - Mammogram on 03/28/2018 was BI-RADS Category 0.  Mammogram and ultrasound on 04/22/2018 shows 5.9 x 5 x 6 cm mass extending in the upper quadrant of the right breast and right axillary lymph node measuring 2.8 x 1.6 x 1.7 cm.  Left breast has retroareolar mass.  Patient did not have mammogram in the last 20 years. - Biopsy on 04/29/2018 of the right breast mass consistent with IDC, grade 2-3, lymph node biopsy positive for metastatic disease, ER/PR positive, HER-2 negative, Ki-67 of 5% -Seen by Dr. Arnoldo Morale who recommended right modified radical mastectomy and left simple mastectomy. - Bone scan showed  increased uptake in the right greater trochanter and right sacroiliac joint.  CT scan of the chest abdomen and pelvis did not show any evidence of metastatic disease.  The uptake in the right greater trochanter was thought to be due to enthesopathic change.  Uptake in the right sacroiliac joint was likely from degenerative changes at the right L5-S1 joint. - Due to advanced nature of her breast cancer, we have recommended neoadjuvant chemotherapy with dose dense AC followed by weekly paclitaxel for 12 weeks. - 4 cycles of dose dense AC from 06/05/2018 through 07/21/2018. - Weekly paclitaxel started on 08/07/2018.  - Last breast exam on 08/21/2018 measures mass 5 cm in the upper quadrant, freely mobile and softer.  Nipple inversion has also improved.  There is a very small lymph node in the right axilla which is also freely mobile palpable.  Left breast mass in the center is stable. - She will proceed with week 5 of paclitaxel today.  She is tolerating it very well.  I have reviewed her blood counts.  I will see her back in 3 to 4 weeks.  We will do another breast exam at that time.  2.  Left breast DCIS: -Biopsy on 04/29/2018 of the left breast shows DCIS, ER/PR positive.  Left axillary lymph node biopsy was negative for malignancy.  3.   Hypokalemia: -Potassium is normal today.  She will continue potassium supplements.

## 2018-09-11 ENCOUNTER — Inpatient Hospital Stay (HOSPITAL_COMMUNITY): Payer: Medicare Other

## 2018-09-11 VITALS — BP 128/51 | HR 70 | Temp 98.2°F | Resp 18 | Wt 155.0 lb

## 2018-09-11 DIAGNOSIS — C773 Secondary and unspecified malignant neoplasm of axilla and upper limb lymph nodes: Secondary | ICD-10-CM | POA: Diagnosis not present

## 2018-09-11 DIAGNOSIS — Z5111 Encounter for antineoplastic chemotherapy: Secondary | ICD-10-CM | POA: Diagnosis not present

## 2018-09-11 DIAGNOSIS — E876 Hypokalemia: Secondary | ICD-10-CM | POA: Diagnosis not present

## 2018-09-11 DIAGNOSIS — Z170 Estrogen receptor positive status [ER+]: Secondary | ICD-10-CM | POA: Diagnosis not present

## 2018-09-11 DIAGNOSIS — C50811 Malignant neoplasm of overlapping sites of right female breast: Secondary | ICD-10-CM

## 2018-09-11 DIAGNOSIS — D0512 Intraductal carcinoma in situ of left breast: Secondary | ICD-10-CM | POA: Diagnosis not present

## 2018-09-11 LAB — CBC WITH DIFFERENTIAL/PLATELET
BASOS ABS: 0 10*3/uL (ref 0.0–0.1)
Basophils Relative: 1 %
EOS PCT: 4 %
Eosinophils Absolute: 0.2 10*3/uL (ref 0.0–0.7)
HCT: 27.6 % — ABNORMAL LOW (ref 36.0–46.0)
Hemoglobin: 9.2 g/dL — ABNORMAL LOW (ref 12.0–15.0)
LYMPHS ABS: 0.5 10*3/uL — AB (ref 0.7–4.0)
LYMPHS PCT: 10 %
MCH: 31.8 pg (ref 26.0–34.0)
MCHC: 33.3 g/dL (ref 30.0–36.0)
MCV: 95.5 fL (ref 78.0–100.0)
MONO ABS: 0.5 10*3/uL (ref 0.1–1.0)
Monocytes Relative: 11 %
Neutro Abs: 3.6 10*3/uL (ref 1.7–7.7)
Neutrophils Relative %: 74 %
PLATELETS: 274 10*3/uL (ref 150–400)
RBC: 2.89 MIL/uL — ABNORMAL LOW (ref 3.87–5.11)
RDW: 21.8 % — AB (ref 11.5–15.5)
WBC: 4.8 10*3/uL (ref 4.0–10.5)

## 2018-09-11 LAB — COMPREHENSIVE METABOLIC PANEL
ALT: 19 U/L (ref 0–44)
ANION GAP: 9 (ref 5–15)
AST: 22 U/L (ref 15–41)
Albumin: 4.2 g/dL (ref 3.5–5.0)
Alkaline Phosphatase: 53 U/L (ref 38–126)
BUN: 18 mg/dL (ref 8–23)
CHLORIDE: 102 mmol/L (ref 98–111)
CO2: 24 mmol/L (ref 22–32)
Calcium: 9.9 mg/dL (ref 8.9–10.3)
Creatinine, Ser: 1.04 mg/dL — ABNORMAL HIGH (ref 0.44–1.00)
GFR, EST NON AFRICAN AMERICAN: 53 mL/min — AB (ref >60)
Glucose, Bld: 108 mg/dL — ABNORMAL HIGH (ref 70–99)
POTASSIUM: 4.2 mmol/L (ref 3.5–5.1)
Sodium: 135 mmol/L (ref 135–145)
Total Bilirubin: 0.7 mg/dL (ref 0.3–1.2)
Total Protein: 7.6 g/dL (ref 6.5–8.1)

## 2018-09-11 MED ORDER — SODIUM CHLORIDE 0.9 % IV SOLN
Freq: Once | INTRAVENOUS | Status: AC
  Administered 2018-09-11: 09:00:00 via INTRAVENOUS

## 2018-09-11 MED ORDER — SODIUM CHLORIDE 0.9% FLUSH
10.0000 mL | INTRAVENOUS | Status: DC | PRN
  Administered 2018-09-11: 10 mL
  Filled 2018-09-11: qty 10

## 2018-09-11 MED ORDER — DIPHENHYDRAMINE HCL 50 MG/ML IJ SOLN
INTRAMUSCULAR | Status: AC
  Filled 2018-09-11: qty 1

## 2018-09-11 MED ORDER — SODIUM CHLORIDE 0.9 % IV SOLN
20.0000 mg | Freq: Once | INTRAVENOUS | Status: AC
  Administered 2018-09-11: 20 mg via INTRAVENOUS
  Filled 2018-09-11: qty 2

## 2018-09-11 MED ORDER — DIPHENHYDRAMINE HCL 50 MG/ML IJ SOLN
50.0000 mg | Freq: Once | INTRAMUSCULAR | Status: AC
  Administered 2018-09-11: 50 mg via INTRAVENOUS

## 2018-09-11 MED ORDER — HEPARIN SOD (PORK) LOCK FLUSH 100 UNIT/ML IV SOLN
500.0000 [IU] | Freq: Once | INTRAVENOUS | Status: AC | PRN
  Administered 2018-09-11: 500 [IU]

## 2018-09-11 MED ORDER — HEPARIN SOD (PORK) LOCK FLUSH 100 UNIT/ML IV SOLN
INTRAVENOUS | Status: AC
  Filled 2018-09-11: qty 25

## 2018-09-11 MED ORDER — FAMOTIDINE IN NACL 20-0.9 MG/50ML-% IV SOLN
INTRAVENOUS | Status: AC
  Filled 2018-09-11: qty 50

## 2018-09-11 MED ORDER — SODIUM CHLORIDE 0.9 % IV SOLN
80.0000 mg/m2 | Freq: Once | INTRAVENOUS | Status: AC
  Administered 2018-09-11: 144 mg via INTRAVENOUS
  Filled 2018-09-11: qty 24

## 2018-09-11 MED ORDER — FAMOTIDINE IN NACL 20-0.9 MG/50ML-% IV SOLN
20.0000 mg | Freq: Once | INTRAVENOUS | Status: AC
  Administered 2018-09-11: 20 mg via INTRAVENOUS

## 2018-09-11 NOTE — Progress Notes (Signed)
Whitney Velez tolerated Taxol infusion without incident or complaint. VSS upon completion of treatment. Discharged self ambulatory in presence of friend.

## 2018-09-11 NOTE — Patient Instructions (Signed)
[place] at [facility]  Discharge Instructions:  Today you received Taxol infusion. Follow up as scheduled. Call clinic for any questions or concerns. _______________________________________________________________  Thank you for choosing [place] at [facility] to provide your oncology and hematology care.  To afford each patient quality time with our providers, please arrive at least 15 minutes before your scheduled appointment.  You need to re-schedule your appointment if you arrive 10 or more minutes late.  We strive to give you quality time with our providers, and arriving late affects you and other patients whose appointments are after yours.  Also, if you no show three or more times for appointments you may be dismissed from the clinic.  Again, thank you for choosing [place] at [facility] hope is that these requests will allow you access to exceptional care and in a timely manner. _______________________________________________________________  If you have questions after your visit, please contact our office at [phone] between the hours of 8:30 a.m. and 5:00 p.m. Voicemails left after 4:30 p.m. will not be returned until the following business day. _______________________________________________________________  For prescription refill requests, have your pharmacy contact our office. _______________________________________________________________  Recommendations made by the consultant and any test results will be sent to your referring physician. _______________________________________________________________

## 2018-09-17 ENCOUNTER — Encounter: Payer: Self-pay | Admitting: Family Medicine

## 2018-09-17 ENCOUNTER — Ambulatory Visit (INDEPENDENT_AMBULATORY_CARE_PROVIDER_SITE_OTHER): Payer: Medicare Other | Admitting: Family Medicine

## 2018-09-17 VITALS — BP 120/78 | Wt 155.0 lb

## 2018-09-17 DIAGNOSIS — R21 Rash and other nonspecific skin eruption: Secondary | ICD-10-CM

## 2018-09-17 DIAGNOSIS — I10 Essential (primary) hypertension: Secondary | ICD-10-CM

## 2018-09-17 DIAGNOSIS — E119 Type 2 diabetes mellitus without complications: Secondary | ICD-10-CM | POA: Diagnosis not present

## 2018-09-17 LAB — POCT GLYCOSYLATED HEMOGLOBIN (HGB A1C): Hemoglobin A1C: 4.5 % (ref 4.0–5.6)

## 2018-09-17 MED ORDER — TRIAMCINOLONE ACETONIDE 0.1 % EX CREA: TOPICAL_CREAM | CUTANEOUS | 0 refills | Status: DC

## 2018-09-17 MED ORDER — AMLODIPINE BESYLATE 5 MG PO TABS: ORAL_TABLET | ORAL | 5 refills | Status: DC

## 2018-09-17 NOTE — Progress Notes (Signed)
   Subjective:    Patient ID: Whitney Velez, female    DOB: Feb 19, 1950, 68 y.o.   MRN: [id number]  Diabetes  She presents for her follow-up diabetic visit. She has type 2 diabetes mellitus. Risk factors for coronary artery disease include diabetes mellitus and hypertension. Current diabetic treatment includes diet. She is compliant with treatment all of the time. Her weight is stable. She is following a diabetic diet.   Patient currently undergoing chemo and will ask next week at appt if she can get flu shot on her chemo  Results for orders placed or performed in visit on 09/17/18  POCT glycosylated hemoglobin (Hb A1C)  Result Value Ref Range   Hemoglobin A1C 4.5 4.0 - 5.6 %   HbA1c POC (<> result, manual entry)     HbA1c, POC (prediabetic range)     HbA1c, POC (controlled diabetic range)     Things have improved woth the chemo, overall doing better  Pt now on two insures per d, and has regained some weight  Itchy patch and rash on back, aggravating, tryin ghydrocort   Patient claims compliance with diabetes medication. No obvious side effects. Reports no substantial low sugar spells. Most numbers are generally in good range when checked fasting. Generally does not miss a dose of medication. Watching diabetic diet closely  Blood pressure medicine and blood pressure levels reviewed today with patient. Compliant with blood pressure medicine. States does not miss a dose. No obvious side effects. Blood pressure generally good when checked elsewhere. Watching salt intake.    Review of Systems No headache, no major weight loss or weight gain, no chest pain no back pain abdominal pain no change in bowel habits complete ROS otherwise negative     Objective:   Physical Exam  Alert and oriented, vitals reviewed and stable, NAD ENT-TM's and ext canals WNL bilat via otoscopic exam Soft palate, tonsils and post pharynx WNL via oropharyngeal exam Neck-symmetric, no masses; thyroid  nonpalpable and nontender Pulmonary-no tachypnea or accessory muscle use; Clear without wheezes via auscultation Card--no abnrml murmurs, rhythm reg and rate WNL Carotid pulses symmetric, without bruits Skin reveals patchy eczematous-like rash on upper and mid back      Assessment & Plan:  Impression type 2 diabetes.  Excellent control.  Diet discussed.  Yearly eye exams encouraged  2.  Eczema discussed.  Triamcinolone cream twice daily to affected area local measures discussed  3.  Hypertension.  Blood pressure good on repeat maintain same therapy compliance discussed  4.  Breast cancer.  Admits to treatments.  Discussed.  Follow-up as scheduled.  Encouraged flu shot patient is going to ask her oncologist first

## 2018-09-18 ENCOUNTER — Encounter (HOSPITAL_COMMUNITY): Payer: Self-pay

## 2018-09-18 ENCOUNTER — Inpatient Hospital Stay (HOSPITAL_COMMUNITY): Payer: Medicare Other

## 2018-09-18 VITALS — BP 109/61 | HR 68 | Temp 97.5°F | Resp 18 | Wt 155.4 lb

## 2018-09-18 DIAGNOSIS — Z170 Estrogen receptor positive status [ER+]: Principal | ICD-10-CM

## 2018-09-18 DIAGNOSIS — Z5111 Encounter for antineoplastic chemotherapy: Secondary | ICD-10-CM | POA: Diagnosis not present

## 2018-09-18 DIAGNOSIS — C50811 Malignant neoplasm of overlapping sites of right female breast: Secondary | ICD-10-CM

## 2018-09-18 DIAGNOSIS — E876 Hypokalemia: Secondary | ICD-10-CM | POA: Diagnosis not present

## 2018-09-18 DIAGNOSIS — D0512 Intraductal carcinoma in situ of left breast: Secondary | ICD-10-CM | POA: Diagnosis not present

## 2018-09-18 DIAGNOSIS — C773 Secondary and unspecified malignant neoplasm of axilla and upper limb lymph nodes: Secondary | ICD-10-CM | POA: Diagnosis not present

## 2018-09-18 LAB — COMPREHENSIVE METABOLIC PANEL
ALBUMIN: 4.1 g/dL (ref 3.5–5.0)
ALT: 17 U/L (ref 0–44)
AST: 22 U/L (ref 15–41)
Alkaline Phosphatase: 54 U/L (ref 38–126)
Anion gap: 8 (ref 5–15)
BILIRUBIN TOTAL: 0.7 mg/dL (ref 0.3–1.2)
BUN: 16 mg/dL (ref 8–23)
CO2: 25 mmol/L (ref 22–32)
CREATININE: 0.99 mg/dL (ref 0.44–1.00)
Calcium: 9.6 mg/dL (ref 8.9–10.3)
Chloride: 102 mmol/L (ref 98–111)
GFR calc Af Amer: >60 mL/min (ref >60)
GFR calc non Af Amer: 56 mL/min — ABNORMAL LOW (ref >60)
GLUCOSE: 122 mg/dL — AB (ref 70–99)
POTASSIUM: 4.4 mmol/L (ref 3.5–5.1)
Sodium: 135 mmol/L (ref 135–145)
TOTAL PROTEIN: 7.4 g/dL (ref 6.5–8.1)

## 2018-09-18 LAB — CBC WITH DIFFERENTIAL/PLATELET
Basophils Absolute: 0 10*3/uL (ref 0.0–0.1)
Basophils Relative: 1 %
EOS ABS: 0.2 10*3/uL (ref 0.0–0.7)
Eosinophils Relative: 5 %
HEMATOCRIT: 26.6 % — AB (ref 36.0–46.0)
Hemoglobin: 8.9 g/dL — ABNORMAL LOW (ref 12.0–15.0)
LYMPHS ABS: 0.5 10*3/uL — AB (ref 0.7–4.0)
Lymphocytes Relative: 14 %
MCH: 32.8 pg (ref 26.0–34.0)
MCHC: 33.5 g/dL (ref 30.0–36.0)
MCV: 98.2 fL (ref 78.0–100.0)
MONOS PCT: 9 %
Monocytes Absolute: 0.3 10*3/uL (ref 0.1–1.0)
NEUTROS ABS: 2.8 10*3/uL (ref 1.7–7.7)
Neutrophils Relative %: 71 %
Platelets: 256 10*3/uL (ref 150–400)
RBC: 2.71 MIL/uL — ABNORMAL LOW (ref 3.87–5.11)
RDW: 20.5 % — AB (ref 11.5–15.5)
WBC: 3.9 10*3/uL — ABNORMAL LOW (ref 4.0–10.5)

## 2018-09-18 MED ORDER — HEPARIN SOD (PORK) LOCK FLUSH 100 UNIT/ML IV SOLN
500.0000 [IU] | Freq: Once | INTRAVENOUS | Status: AC | PRN
  Administered 2018-09-18: 500 [IU]

## 2018-09-18 MED ORDER — SODIUM CHLORIDE 0.9 % IV SOLN
80.0000 mg/m2 | Freq: Once | INTRAVENOUS | Status: AC
  Administered 2018-09-18: 144 mg via INTRAVENOUS
  Filled 2018-09-18: qty 24

## 2018-09-18 MED ORDER — DIPHENHYDRAMINE HCL 50 MG/ML IJ SOLN
50.0000 mg | Freq: Once | INTRAMUSCULAR | Status: AC
  Administered 2018-09-18: 50 mg via INTRAVENOUS
  Filled 2018-09-18: qty 1

## 2018-09-18 MED ORDER — SODIUM CHLORIDE 0.9% FLUSH
10.0000 mL | INTRAVENOUS | Status: DC | PRN
  Administered 2018-09-18: 10 mL
  Filled 2018-09-18: qty 10

## 2018-09-18 MED ORDER — SODIUM CHLORIDE 0.9 % IV SOLN
20.0000 mg | Freq: Once | INTRAVENOUS | Status: AC
  Administered 2018-09-18: 20 mg via INTRAVENOUS
  Filled 2018-09-18: qty 2

## 2018-09-18 MED ORDER — SODIUM CHLORIDE 0.9 % IV SOLN
Freq: Once | INTRAVENOUS | Status: AC
  Administered 2018-09-18: 10:00:00 via INTRAVENOUS

## 2018-09-18 MED ORDER — FAMOTIDINE IN NACL 20-0.9 MG/50ML-% IV SOLN
20.0000 mg | Freq: Once | INTRAVENOUS | Status: AC
  Administered 2018-09-18: 20 mg via INTRAVENOUS
  Filled 2018-09-18: qty 50

## 2018-09-18 NOTE — Patient Instructions (Signed)
[facility] Discharge Instructions for Patients Receiving Chemotherapy  Today you received the following chemotherapy agents taxol.     If you develop nausea and vomiting that is not controlled by your nausea medication, call the clinic.   BELOW ARE SYMPTOMS THAT SHOULD BE REPORTED IMMEDIATELY:  *FEVER GREATER THAN 100.5 F  *CHILLS WITH OR WITHOUT FEVER  NAUSEA AND VOMITING THAT IS NOT CONTROLLED WITH YOUR NAUSEA MEDICATION  *UNUSUAL SHORTNESS OF BREATH  *UNUSUAL BRUISING OR BLEEDING  TENDERNESS IN MOUTH AND THROAT WITH OR WITHOUT PRESENCE OF ULCERS  *URINARY PROBLEMS  *BOWEL PROBLEMS  UNUSUAL RASH Items with * indicate a potential emergency and should be followed up as soon as possible.  Feel free to call the clinic should you have any questions or concerns. The clinic phone number is [phone].  Please show the CHEMO ALERT CARD at check-in to the Emergency Department and triage nurse.   

## 2018-09-18 NOTE — Progress Notes (Signed)
Patient tolerated chemotherapy with no complaints voiced.  Good blood return noted after treatment.  Port site clean and dry with no bruising or swelling noted at site.  VSs with discharge and left ambulatory with no s/s of distress noted.

## 2018-09-25 ENCOUNTER — Inpatient Hospital Stay (HOSPITAL_BASED_OUTPATIENT_CLINIC_OR_DEPARTMENT_OTHER): Payer: Medicare Other | Admitting: Hematology

## 2018-09-25 ENCOUNTER — Encounter (HOSPITAL_COMMUNITY): Payer: Self-pay

## 2018-09-25 ENCOUNTER — Other Ambulatory Visit: Payer: Self-pay

## 2018-09-25 ENCOUNTER — Inpatient Hospital Stay (HOSPITAL_COMMUNITY): Payer: Medicare Other

## 2018-09-25 ENCOUNTER — Encounter (HOSPITAL_COMMUNITY): Payer: Self-pay | Admitting: Hematology

## 2018-09-25 VITALS — BP 127/66 | HR 78 | Temp 97.3°F | Resp 18 | Wt 154.0 lb

## 2018-09-25 DIAGNOSIS — C50811 Malignant neoplasm of overlapping sites of right female breast: Secondary | ICD-10-CM

## 2018-09-25 DIAGNOSIS — Z170 Estrogen receptor positive status [ER+]: Principal | ICD-10-CM

## 2018-09-25 DIAGNOSIS — Z5111 Encounter for antineoplastic chemotherapy: Secondary | ICD-10-CM | POA: Diagnosis not present

## 2018-09-25 DIAGNOSIS — E876 Hypokalemia: Secondary | ICD-10-CM | POA: Diagnosis not present

## 2018-09-25 DIAGNOSIS — D0512 Intraductal carcinoma in situ of left breast: Secondary | ICD-10-CM

## 2018-09-25 DIAGNOSIS — C773 Secondary and unspecified malignant neoplasm of axilla and upper limb lymph nodes: Secondary | ICD-10-CM | POA: Diagnosis not present

## 2018-09-25 LAB — COMPREHENSIVE METABOLIC PANEL
ALK PHOS: 56 U/L (ref 38–126)
ALT: 19 U/L (ref 0–44)
AST: 22 U/L (ref 15–41)
Albumin: 4.1 g/dL (ref 3.5–5.0)
Anion gap: 10 (ref 5–15)
BUN: 15 mg/dL (ref 8–23)
CALCIUM: 9.7 mg/dL (ref 8.9–10.3)
CO2: 23 mmol/L (ref 22–32)
CREATININE: 0.9 mg/dL (ref 0.44–1.00)
Chloride: 100 mmol/L (ref 98–111)
GFR calc non Af Amer: >60 mL/min (ref >60)
GLUCOSE: 104 mg/dL — AB (ref 70–99)
Potassium: 4.1 mmol/L (ref 3.5–5.1)
SODIUM: 133 mmol/L — AB (ref 135–145)
Total Bilirubin: 0.8 mg/dL (ref 0.3–1.2)
Total Protein: 7.4 g/dL (ref 6.5–8.1)

## 2018-09-25 LAB — CBC WITH DIFFERENTIAL/PLATELET
Basophils Absolute: 0 10*3/uL (ref 0.0–0.1)
Basophils Relative: 1 %
EOS ABS: 0.1 10*3/uL (ref 0.0–0.7)
Eosinophils Relative: 3 %
HCT: 27 % — ABNORMAL LOW (ref 36.0–46.0)
HEMOGLOBIN: 9.2 g/dL — AB (ref 12.0–15.0)
LYMPHS ABS: 0.5 10*3/uL — AB (ref 0.7–4.0)
LYMPHS PCT: 13 %
MCH: 34.1 pg — AB (ref 26.0–34.0)
MCHC: 34.1 g/dL (ref 30.0–36.0)
MCV: 100 fL (ref 78.0–100.0)
Monocytes Absolute: 0.4 10*3/uL (ref 0.1–1.0)
Monocytes Relative: 11 %
Neutro Abs: 2.8 10*3/uL (ref 1.7–7.7)
Neutrophils Relative %: 72 %
Platelets: 299 10*3/uL (ref 150–400)
RBC: 2.7 MIL/uL — AB (ref 3.87–5.11)
RDW: 18.4 % — ABNORMAL HIGH (ref 11.5–15.5)
WBC: 3.9 10*3/uL — ABNORMAL LOW (ref 4.0–10.5)

## 2018-09-25 MED ORDER — SODIUM CHLORIDE 0.9 % IV SOLN
Freq: Once | INTRAVENOUS | Status: AC
  Administered 2018-09-25: 09:00:00 via INTRAVENOUS

## 2018-09-25 MED ORDER — SODIUM CHLORIDE 0.9 % IV SOLN
20.0000 mg | Freq: Once | INTRAVENOUS | Status: AC
  Administered 2018-09-25: 20 mg via INTRAVENOUS
  Filled 2018-09-25: qty 2

## 2018-09-25 MED ORDER — FAMOTIDINE IN NACL 20-0.9 MG/50ML-% IV SOLN
20.0000 mg | Freq: Once | INTRAVENOUS | Status: AC
  Administered 2018-09-25: 20 mg via INTRAVENOUS
  Filled 2018-09-25: qty 50

## 2018-09-25 MED ORDER — DIPHENHYDRAMINE HCL 50 MG/ML IJ SOLN
50.0000 mg | Freq: Once | INTRAMUSCULAR | Status: AC
  Administered 2018-09-25: 50 mg via INTRAVENOUS
  Filled 2018-09-25: qty 1

## 2018-09-25 MED ORDER — HEPARIN SOD (PORK) LOCK FLUSH 100 UNIT/ML IV SOLN
500.0000 [IU] | Freq: Once | INTRAVENOUS | Status: AC | PRN
  Administered 2018-09-25: 500 [IU]
  Filled 2018-09-25: qty 5

## 2018-09-25 MED ORDER — SODIUM CHLORIDE 0.9 % IV SOLN
80.0000 mg/m2 | Freq: Once | INTRAVENOUS | Status: AC
  Administered 2018-09-25: 144 mg via INTRAVENOUS
  Filled 2018-09-25: qty 24

## 2018-09-25 NOTE — Assessment & Plan Note (Signed)
1.  Locally advanced (stage IIIa, T3N1) right breast cancer: - Mammogram on 03/28/2018 was BI-RADS Category 0.  Mammogram and ultrasound on 04/22/2018 shows 5.9 x 5 x 6 cm mass extending in the upper quadrant of the right breast and right axillary lymph node measuring 2.8 x 1.6 x 1.7 cm.  Left breast has retroareolar mass.  Patient did not have mammogram in the last 20 years. - Biopsy on 04/29/2018 of the right breast mass consistent with IDC, grade 2-3, lymph node biopsy positive for metastatic disease, ER/PR positive, HER-2 negative, Ki-67 of 5% -Seen by Dr. Arnoldo Morale who recommended right modified radical mastectomy and left simple mastectomy. - Bone scan showed  increased uptake in the right greater trochanter and right sacroiliac joint.  CT scan of the chest abdomen and pelvis did not show any evidence of metastatic disease.  The uptake in the right greater trochanter was thought to be due to enthesopathic change.  Uptake in the right sacroiliac joint was likely from degenerative changes at the right L5-S1 joint. - Due to advanced nature of her breast cancer, we have recommended neoadjuvant chemotherapy with dose dense AC followed by weekly paclitaxel for 12 weeks. - 4 cycles of dose dense AC from 06/05/2018 through 07/21/2018. - Weekly paclitaxel started on 08/07/2018.  - Last breast exam on 08/21/2018 measures mass 5 cm in the upper quadrant, freely mobile and softer.  Nipple inversion has also improved.  There is a very small lymph node in the right axilla which is also freely mobile palpable.  Left breast mass in the center is stable. - She will proceed with her week 8 paclitaxel today without any dose modifications.  She will come back in 2 weeks for follow-up.  We will do breast examination at that time.   2.  Left breast DCIS: -Biopsy on 04/29/2018 of the left breast shows DCIS, ER/PR positive.  Left axillary lymph node biopsy was negative for malignancy.  3.  Hypokalemia: -Potassium is normal today.   She will continue potassium supplements.

## 2018-09-25 NOTE — Progress Notes (Signed)
[address] [place], [place] [address]   CLINIC:  Medical Oncology/Hematology  PCP:  Whitney Kirschner, MD [place] [address] [phone]   REASON FOR VISIT:  Follow-up for right breast cancer, ER+/PR+/HER2-  CURRENT THERAPY: Paclitaxel weekly  BRIEF ONCOLOGIC HISTORY:    Breast cancer, right ([place])   05/12/2018 Initial Diagnosis    Breast cancer, right ([place])    06/02/2018 -  Chemotherapy    The patient had DOXOrubicin (ADRIAMYCIN) chemo injection 110 mg, 60 mg/m2 = 110 mg, Intravenous,  Once, 4 of 4 cycles Administration: 110 mg (06/05/2018), 110 mg (06/19/2018), 110 mg (07/07/2018), 110 mg (07/21/2018) palonosetron (ALOXI) injection 0.25 mg, 0.25 mg, Intravenous,  Once, 4 of 4 cycles Administration: 0.25 mg (06/05/2018), 0.25 mg (06/19/2018), 0.25 mg (07/07/2018), 0.25 mg (07/21/2018) pegfilgrastim-cbqv (UDENYCA) injection 6 mg, 6 mg, Subcutaneous, Once, 4 of 4 cycles Administration: 6 mg (06/06/2018), 6 mg (06/20/2018), 6 mg (07/08/2018), 6 mg (07/22/2018) cyclophosphamide (CYTOXAN) 1,100 mg in sodium chloride 0.9 % 250 mL chemo infusion, 600 mg/m2 = 1,100 mg, Intravenous,  Once, 4 of 4 cycles Administration: 1,100 mg (06/05/2018), 1,100 mg (06/19/2018), 1,100 mg (07/07/2018), 1,100 mg (07/21/2018) PACLitaxel (TAXOL) 144 mg in sodium chloride 0.9 % 250 mL chemo infusion (</= 65m/m2), 80 mg/m2 = 144 mg, Intravenous,  Once, 8 of 12 cycles Administration: 144 mg (08/07/2018), 144 mg (08/14/2018), 144 mg (08/21/2018), 144 mg (08/28/2018), 144 mg (09/04/2018), 144 mg (09/11/2018), 144 mg (09/18/2018), 144 mg (09/25/2018) fosaprepitant (EMEND) 150 mg, dexamethasone (DECADRON) 12 mg in sodium chloride 0.9 % 145 mL IVPB, , Intravenous,  Once, 4 of 4 cycles Administration:  (06/05/2018),  (06/19/2018),  (07/07/2018),  (07/21/2018)  for chemotherapy treatment.       INTERVAL HISTORY:  [name]. female returns for routine follow-up right breast cancer and  consideration for next cycle of chemotherapy. Patient is here today with her son. She has no complaints and she is tolerating chemo well. Her appetite and energy level remain good at 75%. She is also maintaining her weight at this time. She drinks ensure daily. She denies any new pains. Denies any nausea, vomiting, or diarrhea. Denies any bleeding. Denies any numbness or tingling in her hands or feet.     REVIEW OF SYSTEMS:  Review of Systems  All other systems reviewed and are negative.    PAST MEDICAL/SURGICAL HISTORY:  Past Medical History:  Diagnosis Date  . Asthma   . Hypertension    Past Surgical History:  Procedure Laterality Date  . ABDOMINAL HYSTERECTOMY    . PORTACATH PLACEMENT Right 06/04/2018   Procedure: INSERTION PORT-A-CATH;  Surgeon: JAviva Signs MD;  Location: [facility];  Service: General;  Laterality: Right;     SOCIAL HISTORY:  Social History   Socioeconomic History  . Marital status: Widowed    Spouse name: Not on file  . Number of children: Not on file  . Years of education: Not on file  . Highest education level: Not on file  Occupational History  . Not on file  Social Needs  . Financial resource strain: Not on file  . Food insecurity:    Worry: Not on file    Inability: Not on file  . Transportation needs:    Medical: Not on file    Non-medical: Not on file  Tobacco Use  . Smoking status: Never Smoker  . Smokeless tobacco: Never Used  Substance and Sexual Activity  . Alcohol use:  Never    Frequency: Never  . Drug use: Never  . Sexual activity: Not on file  Lifestyle  . Physical activity:    Days per week: Not on file    Minutes per session: Not on file  . Stress: Not on file  Relationships  . Social connections:    Talks on phone: Not on file    Gets together: Not on file    Attends religious service: Not on file    Active member of club or organization: Not on file    Attends meetings of clubs or organizations: Not on file     Relationship status: Not on file  . Intimate partner violence:    Fear of current or ex partner: Not on file    Emotionally abused: Not on file    Physically abused: Not on file    Forced sexual activity: Not on file  Other Topics Concern  . Not on file  Social History Narrative  . Not on file    FAMILY HISTORY:  History reviewed. No pertinent family history.  CURRENT MEDICATIONS:  Outpatient Encounter Medications as of 09/25/2018  Medication Sig  . albuterol (PROAIR HFA) 108 (90 Base) MCG/ACT inhaler INHALE TWO PUFFS INTO THE LUNGS EVERY SIX HOURS AS NEEDED FOR WHEEZING  . albuterol (PROVENTIL) (2.5 MG/3ML) 0.083% nebulizer solution Use via neb q 4 hrs prn wheezing (Patient taking differently: Take 2.5 mg by nebulization every 4 (four) hours as needed for wheezing or shortness of breath. Use via neb q 4 hrs prn wheezing)  . amLODipine (NORVASC) 5 MG tablet Take one tablet daily  . aspirin 81 MG tablet Take 81 mg by mouth daily.  . Cholecalciferol (VITAMIN D-3) 1000 UNITS CAPS Take 1 capsule by mouth daily.   . Flaxseed, Linseed, (FLAXSEED OIL) 1000 MG CAPS Take 1 capsule by mouth daily.   Marland Kitchen lidocaine-prilocaine (EMLA) cream Apply to affected area once  . montelukast (SINGULAIR) 10 MG tablet TAKE ONE TABLET BY MOUTH EVERY NIGHT AT BEDTIME  . nabumetone (RELAFEN) 750 MG tablet Take 1 tablet (750 mg total) by mouth 2 (two) times daily. With food (Patient taking differently: Take 750 mg by mouth 2 (two) times daily as needed for mild pain. With food)  . potassium chloride SA (K-DUR,KLOR-CON) 20 MEQ tablet Take 1 tablet (20 mEq total) by mouth 2 (two) times daily.  . prochlorperazine (COMPAZINE) 10 MG tablet Take 1 tablet (10 mg total) by mouth every 6 (six) hours as needed (Nausea or vomiting).  . traMADol (ULTRAM) 50 MG tablet Take 1 tablet (50 mg total) by mouth every 6 (six) hours as needed.  . triamcinolone cream (KENALOG) 0.1 % Apply twice daily to affected areas.  . [EXPIRED] 0.9 %   sodium chloride infusion   . [EXPIRED] dexamethasone (DECADRON) 20 mg in sodium chloride 0.9 % 50 mL IVPB   . [EXPIRED] diphenhydrAMINE (BENADRYL) injection 50 mg   . [EXPIRED] famotidine (PEPCID) IVPB 20 mg premix   . [EXPIRED] heparin lock flush 100 unit/mL   . [EXPIRED] PACLitaxel (TAXOL) 144 mg in sodium chloride 0.9 % 250 mL chemo infusion (</= 63m/m2)    No facility-administered encounter medications on file as of 09/25/2018.     ALLERGIES:  Allergies  Allergen Reactions  . Penicillins Other (See Comments)    Nausea, vomiting, dizziness Has patient had a PCN reaction causing immediate rash, facial/tongue/throat swelling, SOB or lightheadedness with hypotension: no Has patient had a PCN reaction causing severe rash involving mucus  membranes or skin necrosis: no Has patient had a PCN reaction that required hospitalization: no Has patient had a PCN reaction occurring within the last 10 years: no If all of the above answers are "NO", then may proceed with Cephalosporin use.   Marland Kitchen Percocet [Oxycodone-Acetaminophen]     Nausea, vomiting, diarrhea  . Ranitidine Itching     PHYSICAL EXAM:  ECOG Performance status: 1  Vital SIGNS: BP: 129/60, P: 74, R: 18, T:98.0, SATS: 100% Weight: 154  Physical Exam  Constitutional: She is oriented to person, place, and time. She appears well-developed and well-nourished.  Cardiovascular: Normal rate, regular rhythm and normal heart sounds.  Pulmonary/Chest: Effort normal and breath sounds normal.  Musculoskeletal: Normal range of motion.  Neurological: She is alert and oriented to person, place, and time.  Skin: Skin is warm and dry.  Psychiatric: She has a normal mood and affect. Her behavior is normal. Judgment and thought content normal.     LABORATORY DATA:  I have reviewed the labs as listed.  CBC    Component Value Date/Time   WBC 3.9 (L) 09/25/2018 0817   RBC 2.70 (L) 09/25/2018 0817   HGB 9.2 (L) 09/25/2018 0817   HGB 12.4  02/07/2017 1109   HCT 27.0 (L) 09/25/2018 0817   HCT 38.2 02/07/2017 1109   PLT 299 09/25/2018 0817   PLT 335 02/07/2017 1109   MCV 100.0 09/25/2018 0817   MCV 86 02/07/2017 1109   MCH 34.1 (H) 09/25/2018 0817   MCHC 34.1 09/25/2018 0817   RDW 18.4 (H) 09/25/2018 0817   RDW 15.1 02/07/2017 1109   LYMPHSABS 0.5 (L) 09/25/2018 0817   LYMPHSABS 3.0 02/07/2017 1109   MONOABS 0.4 09/25/2018 0817   EOSABS 0.1 09/25/2018 0817   EOSABS 1.1 (H) 02/07/2017 1109   BASOSABS 0.0 09/25/2018 0817   BASOSABS 0.0 02/07/2017 1109   CMP Latest Ref Rng & Units 09/25/2018 09/18/2018 09/11/2018  Glucose 70 - 99 mg/dL 104(H) 122(H) 108(H)  BUN 8 - 23 mg/dL _0 Creatinine 0.44 - 1.00 mg/dL 0.90 0.99 1.04(H)  Sodium 135 - 145 mmol/L 133(L) 135 135  Potassium 3.5 - 5.1 mmol/L 4.1 4.4 4.2  Chloride 98 - 111 mmol/L 100 102 102  CO2 22 - 32 mmol/L _1 Calcium 8.9 - 10.3 mg/dL 9.7 9.6 9.9  Total Protein 6.5 - 8.1 g/dL 7.4 7.4 7.6  Total Bilirubin 0.3 - 1.2 mg/dL 0.8 0.7 0.7  Alkaline Phos 38 - 126 U/L 56 54 53  AST 15 - 41 U/L _2 ALT 0 - 44 U/L _3 ASSESSMENT & PLAN:   Breast cancer, right ([place] [facility]) 1.  Locally advanced (stage IIIa, T3N1) right breast cancer: - Mammogram on 03/28/2018 was BI-RADS Category 0.  Mammogram and ultrasound on 04/22/2018 shows 5.9 x 5 x 6 cm mass extending in the upper quadrant of the right breast and right axillary lymph node measuring 2.8 x 1.6 x 1.7 cm.  Left breast has retroareolar mass.  Patient did not have mammogram in the last 20 years. - Biopsy on 04/29/2018 of the right breast mass consistent with IDC, grade 2-3, lymph node biopsy positive for metastatic disease, ER/PR positive, HER-2 negative, Ki-67 of 5% -Seen by Dr. Arnoldo Morale who recommended right modified radical mastectomy and left simple mastectomy. - Bone scan showed  increased uptake in the right greater trochanter and right sacroiliac joint.  CT scan of  the chest abdomen and pelvis  did not show any evidence of metastatic disease.  The uptake in the right greater trochanter was thought to be due to enthesopathic change.  Uptake in the right sacroiliac joint was likely from degenerative changes at the right L5-S1 joint. - Due to advanced nature of her breast cancer, we have recommended neoadjuvant chemotherapy with dose dense AC followed by weekly paclitaxel for 12 weeks. - 4 cycles of dose dense AC from 06/05/2018 through 07/21/2018. - Weekly paclitaxel started on 08/07/2018.  - Last breast exam on 08/21/2018 measures mass 5 cm in the upper quadrant, freely mobile and softer.  Nipple inversion has also improved.  There is a very small lymph node in the right axilla which is also freely mobile palpable.  Left breast mass in the center is stable. - She will proceed with her week 8 paclitaxel today without any dose modifications.  She will come back in 2 weeks for follow-up.  We will do breast examination at that time.   2.  Left breast DCIS: -Biopsy on 04/29/2018 of the left breast shows DCIS, ER/PR positive.  Left axillary lymph node biopsy was negative for malignancy.  3.  Hypokalemia: -Potassium is normal today.  She will continue potassium supplements.       Orders placed this encounter:  Orders Placed This Encounter  Procedures  . CBC with Differential/Platelet  . Comprehensive metabolic panel      Derek Jack, MD [place] [phone]

## 2018-09-25 NOTE — Progress Notes (Signed)
Tolerated infusion w/o adverse reaction.  Alert, in no distress.  VSS.  Discharged ambulatory in c/o son.  

## 2018-09-25 NOTE — Patient Instructions (Signed)
[facility] at [facility] Discharge Instructions  Follow up in 2 weeks with labs    Thank you for choosing [facility] at [facility] to provide your oncology and hematology care.  To afford each patient quality time with our provider, please arrive at least 15 minutes before your scheduled appointment time.   If you have a lab appointment with the Cancer Center please come in thru the  Main Entrance and check in at the main information desk  You need to re-schedule your appointment should you arrive 10 or more minutes late.  We strive to give you quality time with our providers, and arriving late affects you and other patients whose appointments are after yours.  Also, if you no show three or more times for appointments you may be dismissed from the clinic at the providers discretion.     Again, thank you for choosing [facility].  Our hope is that these requests will decrease the amount of time that you wait before being seen by our physicians.       _____________________________________________________________  Should you have questions after your visit to  Cancer Center, please contact our office at [phone] between the hours of 8:00 a.m. and 4:30 p.m.  Voicemails left after 4:00 p.m. will not be returned until the following business day.  For prescription refill requests, have your pharmacy contact our office and allow 72 hours.    Cancer Center Support Programs:   > Cancer Support Group  2nd Tuesday of the month 1pm-2pm, Journey Room    

## 2018-10-02 ENCOUNTER — Encounter (HOSPITAL_COMMUNITY): Payer: Self-pay

## 2018-10-02 ENCOUNTER — Inpatient Hospital Stay (HOSPITAL_COMMUNITY): Payer: Medicare Other | Attending: Hematology

## 2018-10-02 ENCOUNTER — Inpatient Hospital Stay (HOSPITAL_COMMUNITY): Payer: Medicare Other

## 2018-10-02 VITALS — BP 122/59 | HR 74 | Temp 97.7°F | Resp 18 | Wt 154.2 lb

## 2018-10-02 DIAGNOSIS — Z79899 Other long term (current) drug therapy: Secondary | ICD-10-CM | POA: Insufficient documentation

## 2018-10-02 DIAGNOSIS — C773 Secondary and unspecified malignant neoplasm of axilla and upper limb lymph nodes: Secondary | ICD-10-CM | POA: Insufficient documentation

## 2018-10-02 DIAGNOSIS — Z170 Estrogen receptor positive status [ER+]: Principal | ICD-10-CM

## 2018-10-02 DIAGNOSIS — Z5111 Encounter for antineoplastic chemotherapy: Secondary | ICD-10-CM | POA: Diagnosis not present

## 2018-10-02 DIAGNOSIS — C50811 Malignant neoplasm of overlapping sites of right female breast: Secondary | ICD-10-CM | POA: Diagnosis not present

## 2018-10-02 DIAGNOSIS — N6452 Nipple discharge: Secondary | ICD-10-CM | POA: Diagnosis not present

## 2018-10-02 DIAGNOSIS — E876 Hypokalemia: Secondary | ICD-10-CM | POA: Diagnosis not present

## 2018-10-02 DIAGNOSIS — Z23 Encounter for immunization: Secondary | ICD-10-CM | POA: Diagnosis not present

## 2018-10-02 DIAGNOSIS — D0512 Intraductal carcinoma in situ of left breast: Secondary | ICD-10-CM | POA: Insufficient documentation

## 2018-10-02 LAB — COMPREHENSIVE METABOLIC PANEL
ALBUMIN: 4 g/dL (ref 3.5–5.0)
ALK PHOS: 53 U/L (ref 38–126)
ALT: 19 U/L (ref 0–44)
ANION GAP: 6 (ref 5–15)
AST: 24 U/L (ref 15–41)
BILIRUBIN TOTAL: 0.7 mg/dL (ref 0.3–1.2)
BUN: 14 mg/dL (ref 8–23)
CALCIUM: 9.9 mg/dL (ref 8.9–10.3)
CO2: 27 mmol/L (ref 22–32)
CREATININE: 0.89 mg/dL (ref 0.44–1.00)
Chloride: 101 mmol/L (ref 98–111)
GFR calc non Af Amer: >60 mL/min (ref >60)
GLUCOSE: 98 mg/dL (ref 70–99)
Potassium: 4.1 mmol/L (ref 3.5–5.1)
Sodium: 134 mmol/L — ABNORMAL LOW (ref 135–145)
TOTAL PROTEIN: 7.5 g/dL (ref 6.5–8.1)

## 2018-10-02 LAB — CBC WITH DIFFERENTIAL/PLATELET
Basophils Absolute: 0 10*3/uL (ref 0.0–0.1)
Basophils Relative: 1 %
Eosinophils Absolute: 0.1 10*3/uL (ref 0.0–0.7)
Eosinophils Relative: 4 %
HEMATOCRIT: 26.9 % — AB (ref 36.0–46.0)
HEMOGLOBIN: 9 g/dL — AB (ref 12.0–15.0)
LYMPHS ABS: 0.5 10*3/uL — AB (ref 0.7–4.0)
Lymphocytes Relative: 13 %
MCH: 33.8 pg (ref 26.0–34.0)
MCHC: 33.5 g/dL (ref 30.0–36.0)
MCV: 101.1 fL — ABNORMAL HIGH (ref 78.0–100.0)
Monocytes Absolute: 0.5 10*3/uL (ref 0.1–1.0)
Monocytes Relative: 12 %
NEUTROS PCT: 70 %
Neutro Abs: 2.6 10*3/uL (ref 1.7–7.7)
Platelets: 289 10*3/uL (ref 150–400)
RBC: 2.66 MIL/uL — AB (ref 3.87–5.11)
RDW: 16.7 % — ABNORMAL HIGH (ref 11.5–15.5)
WBC: 3.7 10*3/uL — AB (ref 4.0–10.5)

## 2018-10-02 MED ORDER — SODIUM CHLORIDE 0.9 % IV SOLN
20.0000 mg | Freq: Once | INTRAVENOUS | Status: AC
  Administered 2018-10-02: 20 mg via INTRAVENOUS
  Filled 2018-10-02: qty 2

## 2018-10-02 MED ORDER — INFLUENZA VAC SPLIT HIGH-DOSE 0.5 ML IM SUSY
0.5000 mL | PREFILLED_SYRINGE | Freq: Once | INTRAMUSCULAR | Status: AC
  Administered 2018-10-02: 0.5 mL via INTRAMUSCULAR
  Filled 2018-10-02: qty 0.5

## 2018-10-02 MED ORDER — HEPARIN SOD (PORK) LOCK FLUSH 100 UNIT/ML IV SOLN
500.0000 [IU] | Freq: Once | INTRAVENOUS | Status: AC | PRN
  Administered 2018-10-02: 500 [IU]
  Filled 2018-10-02: qty 5

## 2018-10-02 MED ORDER — SODIUM CHLORIDE 0.9 % IV SOLN
80.0000 mg/m2 | Freq: Once | INTRAVENOUS | Status: AC
  Administered 2018-10-02: 144 mg via INTRAVENOUS
  Filled 2018-10-02: qty 24

## 2018-10-02 MED ORDER — FAMOTIDINE IN NACL 20-0.9 MG/50ML-% IV SOLN
20.0000 mg | Freq: Once | INTRAVENOUS | Status: AC
  Administered 2018-10-02: 20 mg via INTRAVENOUS
  Filled 2018-10-02: qty 50

## 2018-10-02 MED ORDER — SODIUM CHLORIDE 0.9 % IV SOLN
Freq: Once | INTRAVENOUS | Status: AC
  Administered 2018-10-02: 10:00:00 via INTRAVENOUS

## 2018-10-02 MED ORDER — DIPHENHYDRAMINE HCL 50 MG/ML IJ SOLN
50.0000 mg | Freq: Once | INTRAMUSCULAR | Status: AC
  Administered 2018-10-02: 50 mg via INTRAVENOUS
  Filled 2018-10-02: qty 1

## 2018-10-02 MED ORDER — SODIUM CHLORIDE 0.9% FLUSH
10.0000 mL | INTRAVENOUS | Status: DC | PRN
  Administered 2018-10-02: 10 mL
  Filled 2018-10-02: qty 10

## 2018-10-02 NOTE — Patient Instructions (Addendum)
[facility] Discharge Instructions for Patients Receiving Chemotherapy   Beginning January 23rd 2017 lab work for the [facility] will be done in the  Main lab at [facility] on 1st floor. If you have a lab appointment with the [facility] please come in thru the  Main Entrance and check in at the main information desk   Today you received the following chemotherapy agents Taxol as well as Influenza vaccine. Follow-up as scheduled. Call clinic for any questions or concerns  To help prevent nausea and vomiting after your treatment, we encourage you to take your nausea medication   If you develop nausea and vomiting, or diarrhea that is not controlled by your medication, call the clinic.  The clinic phone number is [phone]. Office hours are Monday-Friday 8:30am-5:00pm.  BELOW ARE SYMPTOMS THAT SHOULD BE REPORTED IMMEDIATELY:  *FEVER GREATER THAN 101.0 F  *CHILLS WITH OR WITHOUT FEVER  NAUSEA AND VOMITING THAT IS NOT CONTROLLED WITH YOUR NAUSEA MEDICATION  *UNUSUAL SHORTNESS OF BREATH  *UNUSUAL BRUISING OR BLEEDING  TENDERNESS IN MOUTH AND THROAT WITH OR WITHOUT PRESENCE OF ULCERS  *URINARY PROBLEMS  *BOWEL PROBLEMS  UNUSUAL RASH Items with * indicate a potential emergency and should be followed up as soon as possible. If you have an emergency after office hours please contact your primary care physician or go to the nearest emergency department.  Please call the clinic during office hours if you have any questions or concerns.   You may also contact the Patient Navigator at [phone] should you have any questions or need assistance in obtaining follow up care.      Resources For Cancer Patients and their Caregivers ? American Cancer Society: Can assist with transportation, wigs, general needs, runs Look Good Feel Better.        4072832557 ? Cancer Care: Provides financial assistance, online support groups, medication/co-pay  assistance.  1-800-813-HOPE (248)165-8811) ? [facility] Assists [place] cancer patients and their families through emotional , educational and financial support.  929-351-8316 ? [place] DSS Where to apply for food stamps, Medicaid and utility assistance. 623-117-8685 ? [facility]: Transportation to medical appointments. 780-197-8075 ? Social Security Administration: May apply for disability if have a Stage IV cancer. 203-581-9715 818-559-8379 ? [employer], Disability and Transit Services: Assists with nutrition, care and transit needs. [phone]

## 2018-10-02 NOTE — Progress Notes (Signed)
Whitney Velez tolerated Taxol infusion and Influenza vaccine well without complaints or incident. Lab results and pt's increase in headaches reviewed with Dr. Delton Coombes prior to administering these medications. VSS upon discharge. Pt discharged self ambulatory in satisfactory condition accompanied by family member

## 2018-10-09 ENCOUNTER — Inpatient Hospital Stay (HOSPITAL_BASED_OUTPATIENT_CLINIC_OR_DEPARTMENT_OTHER): Payer: Medicare Other | Admitting: Hematology

## 2018-10-09 ENCOUNTER — Encounter (HOSPITAL_COMMUNITY): Payer: Self-pay | Admitting: Hematology

## 2018-10-09 ENCOUNTER — Inpatient Hospital Stay (HOSPITAL_COMMUNITY): Payer: Medicare Other

## 2018-10-09 VITALS — BP 115/57 | HR 74 | Temp 98.1°F | Resp 18

## 2018-10-09 DIAGNOSIS — Z170 Estrogen receptor positive status [ER+]: Secondary | ICD-10-CM

## 2018-10-09 DIAGNOSIS — C50811 Malignant neoplasm of overlapping sites of right female breast: Secondary | ICD-10-CM

## 2018-10-09 DIAGNOSIS — C773 Secondary and unspecified malignant neoplasm of axilla and upper limb lymph nodes: Secondary | ICD-10-CM | POA: Diagnosis not present

## 2018-10-09 DIAGNOSIS — Z23 Encounter for immunization: Secondary | ICD-10-CM | POA: Diagnosis not present

## 2018-10-09 DIAGNOSIS — D0512 Intraductal carcinoma in situ of left breast: Secondary | ICD-10-CM

## 2018-10-09 DIAGNOSIS — Z5111 Encounter for antineoplastic chemotherapy: Secondary | ICD-10-CM | POA: Diagnosis not present

## 2018-10-09 DIAGNOSIS — E876 Hypokalemia: Secondary | ICD-10-CM

## 2018-10-09 DIAGNOSIS — N6452 Nipple discharge: Secondary | ICD-10-CM | POA: Diagnosis not present

## 2018-10-09 LAB — CBC WITH DIFFERENTIAL/PLATELET
Abs Immature Granulocytes: 0.04 10*3/uL (ref 0.00–0.07)
BASOS ABS: 0 10*3/uL (ref 0.0–0.1)
Basophils Relative: 1 %
EOS PCT: 4 %
Eosinophils Absolute: 0.1 10*3/uL (ref 0.0–0.5)
HCT: 28.8 % — ABNORMAL LOW (ref 36.0–46.0)
Hemoglobin: 9.2 g/dL — ABNORMAL LOW (ref 12.0–15.0)
Immature Granulocytes: 1 %
Lymphocytes Relative: 15 %
Lymphs Abs: 0.5 10*3/uL — ABNORMAL LOW (ref 0.7–4.0)
MCH: 33.3 pg (ref 26.0–34.0)
MCHC: 31.9 g/dL (ref 30.0–36.0)
MCV: 104.3 fL — ABNORMAL HIGH (ref 80.0–100.0)
MONO ABS: 0.4 10*3/uL (ref 0.1–1.0)
Monocytes Relative: 11 %
NEUTROS ABS: 2.5 10*3/uL (ref 1.7–7.7)
NRBC: 0 % (ref 0.0–0.2)
Neutrophils Relative %: 68 %
PLATELETS: 308 10*3/uL (ref 150–400)
RBC: 2.76 MIL/uL — AB (ref 3.87–5.11)
RDW: 15.3 % (ref 11.5–15.5)
WBC: 3.7 10*3/uL — AB (ref 4.0–10.5)

## 2018-10-09 LAB — COMPREHENSIVE METABOLIC PANEL
ALT: 21 U/L (ref 0–44)
ANION GAP: 9 (ref 5–15)
AST: 25 U/L (ref 15–41)
Albumin: 4.1 g/dL (ref 3.5–5.0)
Alkaline Phosphatase: 61 U/L (ref 38–126)
BUN: 12 mg/dL (ref 8–23)
CO2: 23 mmol/L (ref 22–32)
Calcium: 9.6 mg/dL (ref 8.9–10.3)
Chloride: 103 mmol/L (ref 98–111)
Creatinine, Ser: 0.93 mg/dL (ref 0.44–1.00)
GFR calc non Af Amer: >60 mL/min (ref >60)
Glucose, Bld: 137 mg/dL — ABNORMAL HIGH (ref 70–99)
POTASSIUM: 4.2 mmol/L (ref 3.5–5.1)
SODIUM: 135 mmol/L (ref 135–145)
Total Bilirubin: 0.7 mg/dL (ref 0.3–1.2)
Total Protein: 7.7 g/dL (ref 6.5–8.1)

## 2018-10-09 MED ORDER — DIPHENHYDRAMINE HCL 50 MG/ML IJ SOLN
50.0000 mg | Freq: Once | INTRAMUSCULAR | Status: AC
  Administered 2018-10-09: 50 mg via INTRAVENOUS
  Filled 2018-10-09: qty 1

## 2018-10-09 MED ORDER — SODIUM CHLORIDE 0.9 % IV SOLN
20.0000 mg | Freq: Once | INTRAVENOUS | Status: AC
  Administered 2018-10-09: 20 mg via INTRAVENOUS
  Filled 2018-10-09: qty 2

## 2018-10-09 MED ORDER — SODIUM CHLORIDE 0.9 % IV SOLN
80.0000 mg/m2 | Freq: Once | INTRAVENOUS | Status: AC
  Administered 2018-10-09: 144 mg via INTRAVENOUS
  Filled 2018-10-09: qty 24

## 2018-10-09 MED ORDER — FAMOTIDINE IN NACL 20-0.9 MG/50ML-% IV SOLN
20.0000 mg | Freq: Once | INTRAVENOUS | Status: AC
  Administered 2018-10-09: 20 mg via INTRAVENOUS
  Filled 2018-10-09: qty 50

## 2018-10-09 MED ORDER — SODIUM CHLORIDE 0.9 % IV SOLN
Freq: Once | INTRAVENOUS | Status: AC
  Administered 2018-10-09: 10:00:00 via INTRAVENOUS

## 2018-10-09 MED ORDER — HEPARIN SOD (PORK) LOCK FLUSH 100 UNIT/ML IV SOLN
500.0000 [IU] | Freq: Once | INTRAVENOUS | Status: AC | PRN
  Administered 2018-10-09: 500 [IU]
  Filled 2018-10-09: qty 5

## 2018-10-09 MED ORDER — SODIUM CHLORIDE 0.9% FLUSH
10.0000 mL | INTRAVENOUS | Status: DC | PRN
  Administered 2018-10-09: 10 mL
  Filled 2018-10-09: qty 10

## 2018-10-09 NOTE — Patient Instructions (Signed)
[facility] at [facility] Discharge Instructions     Thank you for choosing  Cancer Center at [facility] to provide your oncology and hematology care.  To afford each patient quality time with our provider, please arrive at least 15 minutes before your scheduled appointment time.   If you have a lab appointment with the Cancer Center please come in thru the  Main Entrance and check in at the main information desk  You need to re-schedule your appointment should you arrive 10 or more minutes late.  We strive to give you quality time with our providers, and arriving late affects you and other patients whose appointments are after yours.  Also, if you no show three or more times for appointments you may be dismissed from the clinic at the providers discretion.     Again, thank you for choosing [facility].  Our hope is that these requests will decrease the amount of time that you wait before being seen by our physicians.       _____________________________________________________________  Should you have questions after your visit to [facility], please contact our office at [phone] between the hours of 8:00 a.m. and 4:30 p.m.  Voicemails left after 4:00 p.m. will not be returned until the following business day.  For prescription refill requests, have your pharmacy contact our office and allow 72 hours.    Cancer Center Support Programs:   > Cancer Support Group  2nd Tuesday of the month 1pm-2pm, Journey Room    

## 2018-10-09 NOTE — Assessment & Plan Note (Signed)
1.  Locally advanced (stage IIIa, T3N1) right breast cancer: - Mammogram on 03/28/2018 was BI-RADS Category 0.  Mammogram and ultrasound on 04/22/2018 shows 5.9 x 5 x 6 cm mass extending in the upper quadrant of the right breast and right axillary lymph node measuring 2.8 x 1.6 x 1.7 cm.  Left breast has retroareolar mass.  Patient did not have mammogram in the last 20 years. - Biopsy on 04/29/2018 of the right breast mass consistent with IDC, grade 2-3, lymph node biopsy positive for metastatic disease, ER/PR positive, HER-2 negative, Ki-67 of 5% -Seen by Dr. Jenkins who recommended right modified radical mastectomy and left simple mastectomy. - Bone scan showed  increased uptake in the right greater trochanter and right sacroiliac joint.  CT scan of the chest abdomen and pelvis did not show any evidence of metastatic disease.  The uptake in the right greater trochanter was thought to be due to enthesopathic change.  Uptake in the right sacroiliac joint was likely from degenerative changes at the right L5-S1 joint. - Due to advanced nature of her breast cancer, we have recommended neoadjuvant chemotherapy with dose dense AC followed by weekly paclitaxel for 12 weeks. - 4 cycles of dose dense AC from 06/05/2018 through 07/21/2018. - Weekly paclitaxel started on 08/07/2018.  - Week 9 of paclitaxel was on 10/02/2018.  She is continuing to tolerate it well.  No neuropathy symptoms noted. -Physical examination today reveals 4 to 5 cm mass in the right breast, softer, freely mobile, about the nipple.  There is serosanguineous discharge from the nipple which is new.  Very small palpable lymph node in the right axilla.  Left breast has stable mass in the center. -She may proceed with week 10 of paclitaxel today.  She will complete 12 weeks of paclitaxel on 10/23/2018. -She will be referred to Dr. Jenkins for right modified radical mastectomy and left simple mastectomy.  I will see her back in 4 weeks for follow-up.  2.   Left breast DCIS: -Biopsy on 04/29/2018 of the left breast shows DCIS, ER/PR positive.  Left axillary lymph node biopsy was negative for malignancy.  3.  Hypokalemia: -She will continue potassium supplements.  Potassium is 4.2 today.  

## 2018-10-09 NOTE — Patient Instructions (Signed)
[facility] Discharge Instructions for Patients Receiving Chemotherapy   Beginning January 23rd 2017 lab work for the Cancer Center will be done in the  Main lab at [facility] on 1st floor. If you have a lab appointment with the Cancer Center please come in thru the  Main Entrance and check in at the main information desk   Today you received the following chemotherapy agents Taxol. Follow-up as scheduled. Call clinic for any questions or concerns  To help prevent nausea and vomiting after your treatment, we encourage you to take your nausea medication   If you develop nausea and vomiting, or diarrhea that is not controlled by your medication, call the clinic.  The clinic phone number is [phone]. Office hours are Monday-Friday 8:30am-5:00pm.  BELOW ARE SYMPTOMS THAT SHOULD BE REPORTED IMMEDIATELY:  *FEVER GREATER THAN 101.0 F  *CHILLS WITH OR WITHOUT FEVER  NAUSEA AND VOMITING THAT IS NOT CONTROLLED WITH YOUR NAUSEA MEDICATION  *UNUSUAL SHORTNESS OF BREATH  *UNUSUAL BRUISING OR BLEEDING  TENDERNESS IN MOUTH AND THROAT WITH OR WITHOUT PRESENCE OF ULCERS  *URINARY PROBLEMS  *BOWEL PROBLEMS  UNUSUAL RASH Items with * indicate a potential emergency and should be followed up as soon as possible. If you have an emergency after office hours please contact your primary care physician or go to the nearest emergency department.  Please call the clinic during office hours if you have any questions or concerns.   You may also contact the Patient Navigator at [phone] should you have any questions or need assistance in obtaining follow up care.      Resources For Cancer Patients and their Caregivers ? American Cancer Society: Can assist with transportation, wigs, general needs, runs Look Good Feel Better.        1-888-227-6333 ? Cancer Care: Provides financial assistance, online support groups, medication/co-pay assistance.  1-800-813-HOPE  (4673) ? [facility] Assists [place] cancer patients and their families through emotional , educational and financial support.  336-427-4357 ? [place] DSS Where to apply for food stamps, Medicaid and utility assistance. 336-342-1394 ? [facility]: Transportation to medical appointments. 336-347-2287 ? Social Security Administration: May apply for disability if have a Stage IV cancer. 336-342-7796 1-800-772-1213 ? [place] Aging, Disability and Transit Services: Assists with nutrition, care and transit needs. [phone]         

## 2018-10-09 NOTE — Progress Notes (Signed)
Whitney Velez, [place] [address]   CLINIC:  Medical Oncology/Hematology  PCP:  Mikey Kirschner, MD [place] [address] [phone]   REASON FOR VISIT: Follow-up for right breast cancer, ER+/PR+/HER2-  CURRENT THERAPY: weekly paclitaxel  BRIEF ONCOLOGIC HISTORY:    Breast cancer, right ([place])   05/12/2018 Initial Diagnosis    Breast cancer, right ([place])    06/02/2018 -  Chemotherapy    The patient had DOXOrubicin (ADRIAMYCIN) chemo injection 110 mg, 60 mg/m2 = 110 mg, Intravenous,  Once, 4 of 4 cycles Administration: 110 mg (06/05/2018), 110 mg (06/19/2018), 110 mg (07/07/2018), 110 mg (07/21/2018) palonosetron (ALOXI) injection 0.25 mg, 0.25 mg, Intravenous,  Once, 4 of 4 cycles Administration: 0.25 mg (06/05/2018), 0.25 mg (06/19/2018), 0.25 mg (07/07/2018), 0.25 mg (07/21/2018) pegfilgrastim-cbqv (UDENYCA) injection 6 mg, 6 mg, Subcutaneous, Once, 4 of 4 cycles Administration: 6 mg (06/06/2018), 6 mg (06/20/2018), 6 mg (07/08/2018), 6 mg (07/22/2018) cyclophosphamide (CYTOXAN) 1,100 mg in sodium chloride 0.9 % 250 mL chemo infusion, 600 mg/m2 = 1,100 mg, Intravenous,  Once, 4 of 4 cycles Administration: 1,100 mg (06/05/2018), 1,100 mg (06/19/2018), 1,100 mg (07/07/2018), 1,100 mg (07/21/2018) PACLitaxel (TAXOL) 144 mg in sodium chloride 0.9 % 250 mL chemo infusion (</= 1m/m2), 80 mg/m2 = 144 mg, Intravenous,  Once, 10 of 12 cycles Administration: 144 mg (08/07/2018), 144 mg (08/14/2018), 144 mg (08/21/2018), 144 mg (08/28/2018), 144 mg (09/04/2018), 144 mg (09/11/2018), 144 mg (09/18/2018), 144 mg (09/25/2018), 144 mg (10/02/2018) fosaprepitant (EMEND) 150 mg, dexamethasone (DECADRON) 12 mg in sodium chloride 0.9 % 145 mL IVPB, , Intravenous,  Once, 4 of 4 cycles Administration:  (06/05/2018),  (06/19/2018),  (07/07/2018),  (07/21/2018)  for chemotherapy treatment.      INTERVAL HISTORY:  [name]. female returns for routine follow-up right  breast cancer. Patient is here today with her daughter. She experiences fatigue, dizziness, and weakness usually days 2-5 after treatment weekly. She starts to improve when its time for her next treatment. She has small amount of serosanguineous discharge from her right nipple.  This is the first time she has noticed this. Her appetite is 50% and she is drinking ensure daily to help maintain her weight. Her energy level is 50%. She denies any new pains or lumps. Denies any nausea, vomiting, or diarrhea. Denies any mouth sores or skin rashes.      REVIEW OF SYSTEMS:  Review of Systems  Constitutional: Positive for fatigue.  Neurological: Positive for dizziness and extremity weakness.  All other systems reviewed and are negative.    PAST MEDICAL/SURGICAL HISTORY:  Past Medical History:  Diagnosis Date  . Asthma   . Hypertension    Past Surgical History:  Procedure Laterality Date  . ABDOMINAL HYSTERECTOMY    . PORTACATH PLACEMENT Right 06/04/2018   Procedure: INSERTION PORT-A-CATH;  Surgeon: JAviva Signs MD;  Location: [facility];  Service: General;  Laterality: Right;     SOCIAL HISTORY:  Social History   Socioeconomic History  . Marital status: Widowed    Spouse name: Not on file  . Number of children: Not on file  . Years of education: Not on file  . Highest education level: Not on file  Occupational History  . Not on file  Social Needs  . Financial resource strain: Not on file  . Food insecurity:    Worry: Not on file    Inability: Not on file  . Transportation needs:  Medical: Not on file    Non-medical: Not on file  Tobacco Use  . Smoking status: Never Smoker  . Smokeless tobacco: Never Used  Substance and Sexual Activity  . Alcohol use: Never    Frequency: Never  . Drug use: Never  . Sexual activity: Not on file  Lifestyle  . Physical activity:    Days per week: Not on file    Minutes per session: Not on file  . Stress: Not on file  Relationships  .  Social connections:    Talks on phone: Not on file    Gets together: Not on file    Attends religious service: Not on file    Active member of club or organization: Not on file    Attends meetings of clubs or organizations: Not on file    Relationship status: Not on file  . Intimate partner violence:    Fear of current or ex partner: Not on file    Emotionally abused: Not on file    Physically abused: Not on file    Forced sexual activity: Not on file  Other Topics Concern  . Not on file  Social History Narrative  . Not on file    FAMILY HISTORY:  History reviewed. No pertinent family history.  CURRENT MEDICATIONS:  Outpatient Encounter Medications as of 10/09/2018  Medication Sig  . albuterol (PROAIR HFA) 108 (90 Base) MCG/ACT inhaler INHALE TWO PUFFS INTO THE LUNGS EVERY SIX HOURS AS NEEDED FOR WHEEZING  . albuterol (PROVENTIL) (2.5 MG/3ML) 0.083% nebulizer solution Use via neb q 4 hrs prn wheezing (Patient taking differently: Take 2.5 mg by nebulization every 4 (four) hours as needed for wheezing or shortness of breath. Use via neb q 4 hrs prn wheezing)  . amLODipine (NORVASC) 5 MG tablet Take one tablet daily  . aspirin 81 MG tablet Take 81 mg by mouth daily.  . Cholecalciferol (VITAMIN D-3) 1000 UNITS CAPS Take 1 capsule by mouth daily.   . Flaxseed, Linseed, (FLAXSEED OIL) 1000 MG CAPS Take 1 capsule by mouth daily.   Marland Kitchen lidocaine-prilocaine (EMLA) cream Apply to affected area once  . montelukast (SINGULAIR) 10 MG tablet TAKE ONE TABLET BY MOUTH EVERY NIGHT AT BEDTIME  . nabumetone (RELAFEN) 750 MG tablet Take 1 tablet (750 mg total) by mouth 2 (two) times daily. With food (Patient taking differently: Take 750 mg by mouth 2 (two) times daily as needed for mild pain. With food)  . potassium chloride SA (K-DUR,KLOR-CON) 20 MEQ tablet Take 1 tablet (20 mEq total) by mouth 2 (two) times daily.  . prochlorperazine (COMPAZINE) 10 MG tablet Take 1 tablet (10 mg total) by mouth every 6  (six) hours as needed (Nausea or vomiting).  . traMADol (ULTRAM) 50 MG tablet Take 1 tablet (50 mg total) by mouth every 6 (six) hours as needed.  . triamcinolone cream (KENALOG) 0.1 % Apply twice daily to affected areas.   No facility-administered encounter medications on file as of 10/09/2018.     ALLERGIES:  Allergies  Allergen Reactions  . Penicillins Other (See Comments)    Nausea, vomiting, dizziness Has patient had a PCN reaction causing immediate rash, facial/tongue/throat swelling, SOB or lightheadedness with hypotension: no Has patient had a PCN reaction causing severe rash involving mucus membranes or skin necrosis: no Has patient had a PCN reaction that required hospitalization: no Has patient had a PCN reaction occurring within the last 10 years: no If all of the above answers are "NO", then may  proceed with Cephalosporin use.   Marland Kitchen Percocet [Oxycodone-Acetaminophen]     Nausea, vomiting, diarrhea  . Ranitidine Itching     PHYSICAL EXAM:  ECOG Performance status: 1  Vitals:   10/09/18 0916  BP: (!) 142/62  Pulse: 76  Resp: 18  Temp: 97.7 F (36.5 C)  SpO2: 100%   Filed Weights   10/09/18 0916  Weight: 152 lb 8 oz (69.2 kg)    Physical Exam  Constitutional: She is oriented to person, place, and time. She appears well-developed and well-nourished.  Neck: Normal range of motion.  Cardiovascular: Normal rate, regular rhythm and normal heart sounds.  Pulmonary/Chest: Effort normal and breath sounds normal.  Abdominal: Soft.  Musculoskeletal: Normal range of motion.  Neurological: She is alert and oriented to person, place, and time.  Skin: Skin is warm.  Right nipple serosanguineous  discharge   Psychiatric: She has a normal mood and affect. Her behavior is normal. Judgment and thought content normal.  Right breast mass measuring 4 to 5 cm above the nipple, freely mobile and feels much softer.  There is discharge.  Left breast mass is stable.   LABORATORY  DATA:  I have reviewed the labs as listed.  CBC    Component Value Date/Time   WBC 3.7 (L) 10/09/2018 0840   RBC 2.76 (L) 10/09/2018 0840   HGB 9.2 (L) 10/09/2018 0840   HGB 12.4 02/07/2017 1109   HCT 28.8 (L) 10/09/2018 0840   HCT 38.2 02/07/2017 1109   PLT 308 10/09/2018 0840   PLT 335 02/07/2017 1109   MCV 104.3 (H) 10/09/2018 0840   MCV 86 02/07/2017 1109   MCH 33.3 10/09/2018 0840   MCHC 31.9 10/09/2018 0840   RDW 15.3 10/09/2018 0840   RDW 15.1 02/07/2017 1109   LYMPHSABS 0.5 (L) 10/09/2018 0840   LYMPHSABS 3.0 02/07/2017 1109   MONOABS 0.4 10/09/2018 0840   EOSABS 0.1 10/09/2018 0840   EOSABS 1.1 (H) 02/07/2017 1109   BASOSABS 0.0 10/09/2018 0840   BASOSABS 0.0 02/07/2017 1109   CMP Latest Ref Rng & Units 10/09/2018 10/02/2018 09/25/2018  Glucose 70 - 99 mg/dL 137(H) 98 104(H)  BUN 8 - 23 mg/dL _0 Creatinine 0.44 - 1.00 mg/dL 0.93 0.89 0.90  Sodium 135 - 145 mmol/L 135 134(L) 133(L)  Potassium 3.5 - 5.1 mmol/L 4.2 4.1 4.1  Chloride 98 - 111 mmol/L 103 101 100  CO2 22 - 32 mmol/L _1 Calcium 8.9 - 10.3 mg/dL 9.6 9.9 9.7  Total Protein 6.5 - 8.1 g/dL 7.7 7.5 7.4  Total Bilirubin 0.3 - 1.2 mg/dL 0.7 0.7 0.8  Alkaline Phos 38 - 126 U/L 61 53 56  AST 15 - 41 U/L _2 ALT 0 - 44 U/L _3 ASSESSMENT & PLAN:   Breast cancer, right ([place]) 1.  Locally advanced (stage IIIa, T3N1) right breast cancer: - Mammogram on 03/28/2018 was BI-RADS Category 0.  Mammogram and ultrasound on 04/22/2018 shows 5.9 x 5 x 6 cm mass extending in the upper quadrant of the right breast and right axillary lymph node measuring 2.8 x 1.6 x 1.7 cm.  Left breast has retroareolar mass.  Patient did not have mammogram in the last 20 years. - Biopsy on 04/29/2018 of the right breast mass consistent with IDC, grade 2-3, lymph node biopsy positive for metastatic disease, ER/PR positive, HER-2 negative, Ki-67 of 5% -Seen by Dr. Arnoldo Morale  who recommended right modified  radical mastectomy and left simple mastectomy. - Bone scan showed  increased uptake in the right greater trochanter and right sacroiliac joint.  CT scan of the chest abdomen and pelvis did not show any evidence of metastatic disease.  The uptake in the right greater trochanter was thought to be due to enthesopathic change.  Uptake in the right sacroiliac joint was likely from degenerative changes at the right L5-S1 joint. - Due to advanced nature of her breast cancer, we have recommended neoadjuvant chemotherapy with dose dense AC followed by weekly paclitaxel for 12 weeks. - 4 cycles of dose dense AC from 06/05/2018 through 07/21/2018. - Weekly paclitaxel started on 08/07/2018.  - Week 9 of paclitaxel was on 10/02/2018.  She is continuing to tolerate it well.  No neuropathy symptoms noted. -Physical examination today reveals 4 to 5 cm mass in the right breast, softer, freely mobile, about the nipple.  There is serosanguineous discharge from the nipple which is new.  Very small palpable lymph node in the right axilla.  Left breast has stable mass in the center. -She may proceed with week 10 of paclitaxel today.  She will complete 12 weeks of paclitaxel on 10/23/2018. -She will be referred to Dr. Arnoldo Morale for right modified radical mastectomy and left simple mastectomy.  I will see her back in 4 weeks for follow-up.  2.  Left breast DCIS: -Biopsy on 04/29/2018 of the left breast shows DCIS, ER/PR positive.  Left axillary lymph node biopsy was negative for malignancy.  3.  Hypokalemia: -She will continue potassium supplements.  Potassium is 4.2 today.       Orders placed this encounter:  No orders of the defined types were placed in this encounter.     Derek Jack, MD [place] [phone]

## 2018-10-09 NOTE — Progress Notes (Signed)
0955 Labs reviewed with and pt seen by Dr. Delton Coombes and pt approved for Taxol infusion today per MD                                                            Whitney Velez tolerated Taxol infusion well without complaints or incident. VSS upon discharge. Pt discharged self ambulatory in satisfactory condition accompanied by her daughter

## 2018-10-13 ENCOUNTER — Telehealth (HOSPITAL_COMMUNITY): Payer: Self-pay | Admitting: Hematology

## 2018-10-13 NOTE — Telephone Encounter (Signed)
Pc to pt to inform her of the [employer] and asked if she would like to apply. Pt would like to apply. She will bring in fin docs on 10/17/18.

## 2018-10-17 ENCOUNTER — Inpatient Hospital Stay (HOSPITAL_COMMUNITY): Payer: Medicare Other

## 2018-10-17 ENCOUNTER — Encounter (HOSPITAL_COMMUNITY): Payer: Self-pay

## 2018-10-17 VITALS — BP 120/75 | HR 77 | Temp 98.1°F | Resp 18

## 2018-10-17 DIAGNOSIS — Z170 Estrogen receptor positive status [ER+]: Principal | ICD-10-CM

## 2018-10-17 DIAGNOSIS — C50811 Malignant neoplasm of overlapping sites of right female breast: Secondary | ICD-10-CM

## 2018-10-17 DIAGNOSIS — C773 Secondary and unspecified malignant neoplasm of axilla and upper limb lymph nodes: Secondary | ICD-10-CM | POA: Diagnosis not present

## 2018-10-17 DIAGNOSIS — D0512 Intraductal carcinoma in situ of left breast: Secondary | ICD-10-CM | POA: Diagnosis not present

## 2018-10-17 DIAGNOSIS — Z5111 Encounter for antineoplastic chemotherapy: Secondary | ICD-10-CM | POA: Diagnosis not present

## 2018-10-17 DIAGNOSIS — Z23 Encounter for immunization: Secondary | ICD-10-CM | POA: Diagnosis not present

## 2018-10-17 LAB — COMPREHENSIVE METABOLIC PANEL
ALT: 18 U/L (ref 0–44)
AST: 26 U/L (ref 15–41)
Albumin: 4.3 g/dL (ref 3.5–5.0)
Alkaline Phosphatase: 58 U/L (ref 38–126)
Anion gap: 9 (ref 5–15)
BUN: 16 mg/dL (ref 8–23)
CHLORIDE: 103 mmol/L (ref 98–111)
CO2: 24 mmol/L (ref 22–32)
Calcium: 9.9 mg/dL (ref 8.9–10.3)
Creatinine, Ser: 1.11 mg/dL — ABNORMAL HIGH (ref 0.44–1.00)
GFR, EST AFRICAN AMERICAN: 57 mL/min — AB (ref >60)
GFR, EST NON AFRICAN AMERICAN: 49 mL/min — AB (ref >60)
Glucose, Bld: 135 mg/dL — ABNORMAL HIGH (ref 70–99)
Potassium: 4.1 mmol/L (ref 3.5–5.1)
SODIUM: 136 mmol/L (ref 135–145)
Total Bilirubin: 0.5 mg/dL (ref 0.3–1.2)
Total Protein: 7.6 g/dL (ref 6.5–8.1)

## 2018-10-17 LAB — CBC WITH DIFFERENTIAL/PLATELET
ABS IMMATURE GRANULOCYTES: 0.09 10*3/uL — AB (ref 0.00–0.07)
BASOS PCT: 1 %
Basophils Absolute: 0 10*3/uL (ref 0.0–0.1)
Eosinophils Absolute: 0.2 10*3/uL (ref 0.0–0.5)
Eosinophils Relative: 4 %
HCT: 29.9 % — ABNORMAL LOW (ref 36.0–46.0)
Hemoglobin: 9.9 g/dL — ABNORMAL LOW (ref 12.0–15.0)
Immature Granulocytes: 2 %
Lymphocytes Relative: 13 %
Lymphs Abs: 0.6 10*3/uL — ABNORMAL LOW (ref 0.7–4.0)
MCH: 34.3 pg — AB (ref 26.0–34.0)
MCHC: 33.1 g/dL (ref 30.0–36.0)
MCV: 103.5 fL — AB (ref 80.0–100.0)
MONO ABS: 0.4 10*3/uL (ref 0.1–1.0)
MONOS PCT: 10 %
NEUTROS ABS: 3.1 10*3/uL (ref 1.7–7.7)
NEUTROS PCT: 70 %
Platelets: 324 10*3/uL (ref 150–400)
RBC: 2.89 MIL/uL — AB (ref 3.87–5.11)
RDW: 14.5 % (ref 11.5–15.5)
WBC: 4.4 10*3/uL (ref 4.0–10.5)
nRBC: 0 % (ref 0.0–0.2)

## 2018-10-17 MED ORDER — FAMOTIDINE IN NACL 20-0.9 MG/50ML-% IV SOLN
20.0000 mg | Freq: Once | INTRAVENOUS | Status: AC
  Administered 2018-10-17: 20 mg via INTRAVENOUS
  Filled 2018-10-17: qty 50

## 2018-10-17 MED ORDER — SODIUM CHLORIDE 0.9 % IV SOLN
80.0000 mg/m2 | Freq: Once | INTRAVENOUS | Status: AC
  Administered 2018-10-17: 144 mg via INTRAVENOUS
  Filled 2018-10-17: qty 24

## 2018-10-17 MED ORDER — SODIUM CHLORIDE 0.9% FLUSH
10.0000 mL | INTRAVENOUS | Status: DC | PRN
  Administered 2018-10-17: 10 mL
  Filled 2018-10-17: qty 10

## 2018-10-17 MED ORDER — HEPARIN SOD (PORK) LOCK FLUSH 100 UNIT/ML IV SOLN
500.0000 [IU] | Freq: Once | INTRAVENOUS | Status: AC | PRN
  Administered 2018-10-17: 500 [IU]

## 2018-10-17 MED ORDER — SODIUM CHLORIDE 0.9 % IV SOLN
Freq: Once | INTRAVENOUS | Status: AC
  Administered 2018-10-17: 12:00:00 via INTRAVENOUS

## 2018-10-17 MED ORDER — SODIUM CHLORIDE 0.9 % IV SOLN
20.0000 mg | Freq: Once | INTRAVENOUS | Status: AC
  Administered 2018-10-17: 20 mg via INTRAVENOUS
  Filled 2018-10-17: qty 2

## 2018-10-17 MED ORDER — DIPHENHYDRAMINE HCL 50 MG/ML IJ SOLN
50.0000 mg | Freq: Once | INTRAMUSCULAR | Status: AC
  Administered 2018-10-17: 50 mg via INTRAVENOUS
  Filled 2018-10-17: qty 1

## 2018-10-17 NOTE — Patient Instructions (Signed)
[facility] Discharge Instructions for Patients Receiving Chemotherapy   Beginning January 23rd 2017 lab work for the Cancer Center will be done in the  Main lab at [facility] on 1st floor. If you have a lab appointment with the Cancer Center please come in thru the  Main Entrance and check in at the main information desk   Today you received the following chemotherapy agents Taxol. Follow-up as scheduled. Call clinic for any questions or concerns  To help prevent nausea and vomiting after your treatment, we encourage you to take your nausea medication   If you develop nausea and vomiting, or diarrhea that is not controlled by your medication, call the clinic.  The clinic phone number is [phone]. Office hours are Monday-Friday 8:30am-5:00pm.  BELOW ARE SYMPTOMS THAT SHOULD BE REPORTED IMMEDIATELY:  *FEVER GREATER THAN 101.0 F  *CHILLS WITH OR WITHOUT FEVER  NAUSEA AND VOMITING THAT IS NOT CONTROLLED WITH YOUR NAUSEA MEDICATION  *UNUSUAL SHORTNESS OF BREATH  *UNUSUAL BRUISING OR BLEEDING  TENDERNESS IN MOUTH AND THROAT WITH OR WITHOUT PRESENCE OF ULCERS  *URINARY PROBLEMS  *BOWEL PROBLEMS  UNUSUAL RASH Items with * indicate a potential emergency and should be followed up as soon as possible. If you have an emergency after office hours please contact your primary care physician or go to the nearest emergency department.  Please call the clinic during office hours if you have any questions or concerns.   You may also contact the Patient Navigator at [phone] should you have any questions or need assistance in obtaining follow up care.      Resources For Cancer Patients and their Caregivers ? American Cancer Society: Can assist with transportation, wigs, general needs, runs Look Good Feel Better.        1-888-227-6333 ? Cancer Care: Provides financial assistance, online support groups, medication/co-pay assistance.  1-800-813-HOPE  (4673) ? [facility] Assists [place] cancer patients and their families through emotional , educational and financial support.  336-427-4357 ? [place] DSS Where to apply for food stamps, Medicaid and utility assistance. 336-342-1394 ? [facility]: Transportation to medical appointments. 336-347-2287 ? Social Security Administration: May apply for disability if have a Stage IV cancer. 336-342-7796 1-800-772-1213 ? [place] Aging, Disability and Transit Services: Assists with nutrition, care and transit needs. [phone]         

## 2018-10-17 NOTE — Progress Notes (Signed)
Whitney Velez tolerated Taxol infusion well without complaints or incident. Labs reviewed prior to administering this medication. VSS upon discharge. Pt discharged self ambulatory in satisfactory condition accompanied by family member

## 2018-10-24 ENCOUNTER — Inpatient Hospital Stay (HOSPITAL_COMMUNITY): Payer: Medicare Other | Attending: Hematology

## 2018-10-24 ENCOUNTER — Other Ambulatory Visit: Payer: Self-pay

## 2018-10-24 ENCOUNTER — Encounter (HOSPITAL_COMMUNITY): Payer: Self-pay

## 2018-10-24 ENCOUNTER — Inpatient Hospital Stay (HOSPITAL_COMMUNITY): Payer: Medicare Other

## 2018-10-24 VITALS — BP 117/67 | HR 80 | Temp 97.8°F | Resp 16 | Wt 150.8 lb

## 2018-10-24 DIAGNOSIS — Z170 Estrogen receptor positive status [ER+]: Secondary | ICD-10-CM

## 2018-10-24 DIAGNOSIS — C773 Secondary and unspecified malignant neoplasm of axilla and upper limb lymph nodes: Secondary | ICD-10-CM | POA: Diagnosis not present

## 2018-10-24 DIAGNOSIS — Z23 Encounter for immunization: Secondary | ICD-10-CM | POA: Diagnosis not present

## 2018-10-24 DIAGNOSIS — C50811 Malignant neoplasm of overlapping sites of right female breast: Secondary | ICD-10-CM | POA: Diagnosis not present

## 2018-10-24 DIAGNOSIS — Z5111 Encounter for antineoplastic chemotherapy: Secondary | ICD-10-CM | POA: Diagnosis not present

## 2018-10-24 DIAGNOSIS — D0512 Intraductal carcinoma in situ of left breast: Secondary | ICD-10-CM | POA: Diagnosis not present

## 2018-10-24 LAB — CBC WITH DIFFERENTIAL/PLATELET
Abs Immature Granulocytes: 0.04 10*3/uL (ref 0.00–0.07)
Basophils Absolute: 0 10*3/uL (ref 0.0–0.1)
Basophils Relative: 1 %
Eosinophils Absolute: 0.2 10*3/uL (ref 0.0–0.5)
Eosinophils Relative: 4 %
HEMATOCRIT: 29.8 % — AB (ref 36.0–46.0)
HEMOGLOBIN: 9.8 g/dL — AB (ref 12.0–15.0)
Immature Granulocytes: 1 %
LYMPHS ABS: 0.6 10*3/uL — AB (ref 0.7–4.0)
LYMPHS PCT: 15 %
MCH: 34.4 pg — AB (ref 26.0–34.0)
MCHC: 32.9 g/dL (ref 30.0–36.0)
MCV: 104.6 fL — ABNORMAL HIGH (ref 80.0–100.0)
MONO ABS: 0.4 10*3/uL (ref 0.1–1.0)
MONOS PCT: 10 %
Neutro Abs: 3.1 10*3/uL (ref 1.7–7.7)
Neutrophils Relative %: 69 %
Platelets: 308 10*3/uL (ref 150–400)
RBC: 2.85 MIL/uL — ABNORMAL LOW (ref 3.87–5.11)
RDW: 14 % (ref 11.5–15.5)
WBC: 4.4 10*3/uL (ref 4.0–10.5)
nRBC: 0 % (ref 0.0–0.2)

## 2018-10-24 LAB — COMPREHENSIVE METABOLIC PANEL
ALBUMIN: 4.3 g/dL (ref 3.5–5.0)
ALK PHOS: 59 U/L (ref 38–126)
ALT: 20 U/L (ref 0–44)
AST: 22 U/L (ref 15–41)
Anion gap: 9 (ref 5–15)
BILIRUBIN TOTAL: 0.8 mg/dL (ref 0.3–1.2)
BUN: 16 mg/dL (ref 8–23)
CO2: 25 mmol/L (ref 22–32)
Calcium: 9.8 mg/dL (ref 8.9–10.3)
Chloride: 102 mmol/L (ref 98–111)
Creatinine, Ser: 1.09 mg/dL — ABNORMAL HIGH (ref 0.44–1.00)
GFR calc non Af Amer: 50 mL/min — ABNORMAL LOW (ref >60)
GFR, EST AFRICAN AMERICAN: 58 mL/min — AB (ref >60)
GLUCOSE: 139 mg/dL — AB (ref 70–99)
Potassium: 4 mmol/L (ref 3.5–5.1)
Sodium: 136 mmol/L (ref 135–145)
TOTAL PROTEIN: 7.5 g/dL (ref 6.5–8.1)

## 2018-10-24 MED ORDER — DIPHENHYDRAMINE HCL 50 MG/ML IJ SOLN: 50.0000 mg | Freq: Once | INTRAMUSCULAR | Status: DC

## 2018-10-24 MED ORDER — SODIUM CHLORIDE 0.9 % IV SOLN
20.0000 mg | Freq: Once | INTRAVENOUS | Status: AC
  Administered 2018-10-24: 20 mg via INTRAVENOUS
  Filled 2018-10-24: qty 2

## 2018-10-24 MED ORDER — SODIUM CHLORIDE 0.9 % IV SOLN
80.0000 mg/m2 | Freq: Once | INTRAVENOUS | Status: AC
  Administered 2018-10-24: 144 mg via INTRAVENOUS
  Filled 2018-10-24: qty 24

## 2018-10-24 MED ORDER — HEPARIN SOD (PORK) LOCK FLUSH 100 UNIT/ML IV SOLN
500.0000 [IU] | Freq: Once | INTRAVENOUS | Status: AC | PRN
  Administered 2018-10-24: 500 [IU]

## 2018-10-24 MED ORDER — FAMOTIDINE IN NACL 20-0.9 MG/50ML-% IV SOLN
INTRAVENOUS | Status: AC
  Filled 2018-10-24: qty 50

## 2018-10-24 MED ORDER — SODIUM CHLORIDE 0.9 % IV SOLN
Freq: Once | INTRAVENOUS | Status: AC
  Administered 2018-10-24: 12:00:00 via INTRAVENOUS

## 2018-10-24 MED ORDER — SODIUM CHLORIDE 0.9% FLUSH
10.0000 mL | INTRAVENOUS | Status: DC | PRN
  Administered 2018-10-24: 10 mL
  Filled 2018-10-24: qty 10

## 2018-10-24 MED ORDER — FAMOTIDINE IN NACL 20-0.9 MG/50ML-% IV SOLN
20.0000 mg | Freq: Once | INTRAVENOUS | Status: AC
  Administered 2018-10-24: 20 mg via INTRAVENOUS

## 2018-10-24 NOTE — Progress Notes (Signed)
Pt ambulated to room 407 for last treatment of Taxol. VSS. No complaints at this time. Labs reviewed.   Last treatment given today per MD orders. Tolerated infusion without adverse affects. Vital signs stable. No complaints at this time. Discharged from clinic ambulatory.

## 2018-10-24 NOTE — Patient Instructions (Signed)
[facility] Discharge Instructions for Patients Receiving Chemotherapy  Today you received the following chemotherapy agents   To help prevent nausea and vomiting after your treatment, we encourage you to take your nausea medication   If you develop nausea and vomiting that is not controlled by your nausea medication, call the clinic.   BELOW ARE SYMPTOMS THAT SHOULD BE REPORTED IMMEDIATELY:  *FEVER GREATER THAN 100.5 F  *CHILLS WITH OR WITHOUT FEVER  NAUSEA AND VOMITING THAT IS NOT CONTROLLED WITH YOUR NAUSEA MEDICATION  *UNUSUAL SHORTNESS OF BREATH  *UNUSUAL BRUISING OR BLEEDING  TENDERNESS IN MOUTH AND THROAT WITH OR WITHOUT PRESENCE OF ULCERS  *URINARY PROBLEMS  *BOWEL PROBLEMS  UNUSUAL RASH Items with * indicate a potential emergency and should be followed up as soon as possible.  Feel free to call the clinic should you have any questions or concerns. The clinic phone number is [phone].  Please show the CHEMO ALERT CARD at check-in to the Emergency Department and triage nurse.   

## 2018-10-28 DIAGNOSIS — E1142 Type 2 diabetes mellitus with diabetic polyneuropathy: Secondary | ICD-10-CM | POA: Diagnosis not present

## 2018-10-28 DIAGNOSIS — B351 Tinea unguium: Secondary | ICD-10-CM | POA: Diagnosis not present

## 2018-11-06 ENCOUNTER — Encounter (INDEPENDENT_AMBULATORY_CARE_PROVIDER_SITE_OTHER): Payer: Self-pay

## 2018-11-06 ENCOUNTER — Encounter: Payer: Self-pay | Admitting: General Surgery

## 2018-11-06 ENCOUNTER — Ambulatory Visit (INDEPENDENT_AMBULATORY_CARE_PROVIDER_SITE_OTHER): Payer: Medicare Other | Admitting: General Surgery

## 2018-11-06 VITALS — BP 168/86 | HR 123 | Temp 96.9°F | Resp 16 | Wt 147.8 lb

## 2018-11-06 DIAGNOSIS — C50911 Malignant neoplasm of unspecified site of right female breast: Secondary | ICD-10-CM | POA: Diagnosis not present

## 2018-11-06 DIAGNOSIS — C50012 Malignant neoplasm of nipple and areola, left female breast: Secondary | ICD-10-CM | POA: Diagnosis not present

## 2018-11-06 DIAGNOSIS — Z170 Estrogen receptor positive status [ER+]: Secondary | ICD-10-CM | POA: Diagnosis not present

## 2018-11-06 DIAGNOSIS — C773 Secondary and unspecified malignant neoplasm of axilla and upper limb lymph nodes: Secondary | ICD-10-CM | POA: Diagnosis not present

## 2018-11-06 NOTE — Progress Notes (Signed)
Subjective:     Whitney Velez  Patient was referred back by Dr. Delton Coombes for surgery.  Patient has finished her neoadjuvant therapy.  Patient just finished chemotherapy last week.  She does feel weak at the present time. Objective:    BP (!) 168/86 (BP Location: Left Arm, Patient Position: Sitting, Cuff Size: Normal)   Pulse (!) 123   Temp (!) 96.9 F (36.1 C) (Temporal)   Resp 16   Wt 147 lb 12.8 oz (67 kg)   BMI 28.87 kg/m   General:  alert, cooperative and no distress  Lungs clear to auscultation with equal breath sounds bilaterally Heart examination reveals regular rate and rhythm without S3, S4, murmurs Right breast with increased density in nipple retraction centrally.  No palpable lymph nodes noted. Left breast examination without dominant mass, nipple discharge, dimpling.  The axilla is negative for palpable nodes.  Dr. 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  She would like to defer any reconstructive surgery until after she is finished with chemotherapy.  She is scheduled for a right modified radical mastectomy, left simple mastectomy on 12/01/2018.  The risks and benefits of the procedure including bleeding, infection, and the possibility of a blood transfusion were fully explained to the patient, who gave informed consent.

## 2018-11-06 NOTE — Patient Instructions (Signed)
Total or Modified Radical Mastectomy A total mastectomy and a modified radical mastectomy are types of surgery for breast cancer. If you are having a total mastectomy (simple mastectomy), your entire breast will be removed. If you are having a modified radical mastectomy, your breast and nipple will be removed along with the lymph nodes under your arm. You may also have some of the lining over the muscle tissues under your breast removed. Let your health care provider know about:  Any allergies you have.  All medicines you are taking, including vitamins, herbs, eye drops, creams, and over-the-counter medicines.  Previous problems you or members of your family have had with the use of anesthetics.  Any blood disorders you have.  Any surgeries you have had.  Any medical conditions you have. What are the risks? Generally, this is a safe procedure. However, problems may occur, including:  Pain.  Infection.  Bleeding.  Scar tissue.  Chest numbness on the side of the surgery.  Fluid buildup under the skin flaps where your breast was removed (seroma).  Sensation of throbbing or tingling.  Stress or sadness from losing your breast.  If you have the lymph nodes under your arm removed, you may have arm swelling, weakness, or numbness on the same side of your body as your surgery. What happens before the procedure?  Ask your health care provider about: ? Changing or stopping your regular medicines. This is especially important if you are taking diabetes medicines or blood thinners. ? Taking medicines such as aspirin and ibuprofen. These medicines can thin your blood. Do not take these medicines before your procedure if your health care provider instructs you not to.  Follow your health care provider's instructions about eating or drinking restrictions.  You may be checked for extra fluid around your lymph nodes (lymphedema).  Plan to have someone take you home after the  procedure. What happens during the procedure?  An IV tube will be inserted into one of your veins.  You will be given a medicine that makes you fall asleep (general anesthetic).  Your breast will be cleaned with a germ-killing solution (antiseptic).  A wide incision will be made around your nipple. The skin and nipple inside the incision will be removed along with all breast tissue.  If you are having a modified radical mastectomy: ? The lining over your chest muscles will be removed. ? The incision may be extended to reach the lymph nodes under your arm, or a second incision may be made. ? The lymph nodes will be removed.  You may have a drainage tube inserted into your incision to collect fluid that builds up after surgery. This tube is connected to a suction bulb.  Your incision or incisions will be closed with stitches (sutures).  A bandage (dressing) will be placed over your breast and under your arm. The procedure may vary among health care providers and hospitals. What happens after the procedure?  You will be moved to a recovery area.  Your blood pressure, heart rate, breathing rate, and blood oxygen level will be monitored often until the medicines you were given have worn off.  You will be given pain medicine as needed.  After a while, you will be taken to a hospital room.  You will be encouraged to get up and walk as soon as you can.  Your IV tube can be removed when you are able to eat and drink.  Your drain may be removed before you go home   from the hospital, or you may be sent home with your drain and suction bulb. This information is not intended to replace advice given to you by your health care provider. Make sure you discuss any questions you have with your health care provider. Document Released: 09/11/2001 Document Revised: 08/23/2016 Document Reviewed: 09/01/2014 Elsevier Interactive Patient Education  2018 Elsevier Inc.  

## 2018-11-06 NOTE — H&P (Signed)
Whitney Velez; [id number]; [birth year]   HPI Patient is a 68 year old white female who was referred to my care by [facility] radiology, Dr. Shelly Bombard, for evaluation treatment of bilateral breast cancer.  This was found on screening mammography.  Patient has had biopsies at multiple sites in both the right and left breast.  Patient denies any family history of cancer.  She states she has had lumps in both breasts for some time, but was avoiding undergoing mammography.  She denies any nipple discharge.  She was referred to oncology and underwent neoadjuvant chemotherapy.  She has finished her chemotherapy and is referred back to my care by Dr. Delton Coombes for bilateral mastectomies. History reviewed. No pertinent past medical history.  History reviewed. No pertinent surgical history.  History reviewed. No pertinent family history.  Current Outpatient Medications on File Prior to Visit  Medication Sig Dispense Refill  . albuterol (PROAIR HFA) 108 (90 Base) MCG/ACT inhaler INHALE TWO PUFFS INTO THE LUNGS EVERY SIX HOURS AS NEEDED FOR WHEEZING 8.5 g 5  . albuterol (PROVENTIL) (2.5 MG/3ML) 0.083% nebulizer solution Use via neb q 4 hrs prn wheezing 25 vial 2  . amLODipine (NORVASC) 10 MG tablet Take 1 tablet (10 mg total) by mouth daily. 30 tablet 4  . aspirin 81 MG tablet Take 81 mg by mouth daily.    . Cholecalciferol (VITAMIN D-3) 1000 UNITS CAPS Take by mouth daily.    . Flaxseed, Linseed, (FLAXSEED OIL) 1000 MG CAPS Take by mouth daily.    . hydrochlorothiazide (HYDRODIURIL) 25 MG tablet Take 1 tablet (25 mg total) by mouth daily. 30 tablet 5  . metFORMIN (GLUCOPHAGE) 500 MG tablet Take 1 tablet (500 mg total) by mouth daily with breakfast. 30 tablet 5  . montelukast (SINGULAIR) 10 MG tablet TAKE ONE TABLET BY MOUTH EVERY NIGHT AT BEDTIME 30 tablet 5  . nabumetone (RELAFEN) 750 MG tablet Take 1 tablet (750 mg total) by mouth 2 (two) times daily. With food 28 tablet 5   Current Facility-Administered  Medications on File Prior to Visit  Medication Dose Route Frequency Provider Last Rate Last Dose  . methylPREDNISolone acetate (DEPO-MEDROL) injection 40 mg  40 mg Intra-articular Once Mikey Kirschner, MD      . methylPREDNISolone acetate (DEPO-MEDROL) injection 40 mg  40 mg Intra-articular Once Mikey Kirschner, MD        Allergies  Allergen Reactions  . Penicillins     Nausea, vomiting, dizziness  . Percocet [Oxycodone-Acetaminophen]     Nausea, vomiting, diarrhea  . Ranitidine Itching    Social History   Substance and Sexual Activity  Alcohol Use Not on file    Social History   Tobacco Use  Smoking Status Never Smoker  Smokeless Tobacco Never Used    Review of Systems  Constitutional: Negative.   HENT: Negative.   Eyes: Negative.   Respiratory: Negative.   Cardiovascular: Negative.   Genitourinary: Negative.   Musculoskeletal: Negative.   Skin: Negative.   Neurological: Negative.   Endo/Heme/Allergies: Negative.   Psychiatric/Behavioral: Negative.     Objective   Vitals:   05/06/18 1336  BP: (!) 156/74  Pulse: 79  Resp: 18  Temp: 98.6 F (37 C)    Physical Exam  Constitutional: She is oriented to person, place, and time. She appears well-developed and well-nourished.  HENT:  Head: Normocephalic and atraumatic.  Neck: Normal range of motion. Neck supple.  Cardiovascular: Normal rate and regular rhythm. Exam reveals no gallop and no friction rub.  Murmur heard. 2 out of 6 systolic ejection murmur.  Pulmonary/Chest: Effort normal and breath sounds normal. No stridor. No respiratory distress. She has no wheezes. She has no rales.  Lymphadenopathy:    She has no cervical adenopathy.  Neurological: She is alert and oriented to person, place, and time.  Vitals reviewed. Breast: Dominant deep large mass which appears fixated to the chest wall with indentation inferiorly at the 6 to 7 o'clock position.  Dimpling noted in this region.  Right axilla with  palpable lymph node.  Left breast with increased density noted centrally extending towards the chest wall.  Left axilla without palpable lymph nodes. Final pathology: Invasive ductal carcinoma of the right breast, positive lymph node.  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  The risks and benefits of the procedures including bleeding, infection, cardiopulmonary difficulties, and the possibility of a blood transfusion were fully explained to the patient, who gave informed consent.

## 2018-11-07 ENCOUNTER — Other Ambulatory Visit (HOSPITAL_COMMUNITY): Payer: Self-pay | Admitting: *Deleted

## 2018-11-07 ENCOUNTER — Telehealth (HOSPITAL_COMMUNITY): Payer: Self-pay | Admitting: Hematology

## 2018-11-07 ENCOUNTER — Inpatient Hospital Stay (HOSPITAL_BASED_OUTPATIENT_CLINIC_OR_DEPARTMENT_OTHER): Payer: Medicare Other | Admitting: Hematology

## 2018-11-07 ENCOUNTER — Encounter (HOSPITAL_COMMUNITY): Payer: Self-pay | Admitting: Hematology

## 2018-11-07 ENCOUNTER — Other Ambulatory Visit: Payer: Self-pay

## 2018-11-07 ENCOUNTER — Inpatient Hospital Stay (HOSPITAL_COMMUNITY): Payer: Medicare Other | Attending: Hematology

## 2018-11-07 VITALS — BP 117/64 | HR 103 | Temp 98.0°F | Resp 16 | Wt 146.0 lb

## 2018-11-07 DIAGNOSIS — E876 Hypokalemia: Secondary | ICD-10-CM

## 2018-11-07 DIAGNOSIS — Z79899 Other long term (current) drug therapy: Secondary | ICD-10-CM | POA: Diagnosis not present

## 2018-11-07 DIAGNOSIS — C50811 Malignant neoplasm of overlapping sites of right female breast: Secondary | ICD-10-CM

## 2018-11-07 DIAGNOSIS — C773 Secondary and unspecified malignant neoplasm of axilla and upper limb lymph nodes: Secondary | ICD-10-CM | POA: Diagnosis not present

## 2018-11-07 DIAGNOSIS — Z170 Estrogen receptor positive status [ER+]: Secondary | ICD-10-CM

## 2018-11-07 DIAGNOSIS — Z9013 Acquired absence of bilateral breasts and nipples: Secondary | ICD-10-CM | POA: Diagnosis not present

## 2018-11-07 DIAGNOSIS — D0512 Intraductal carcinoma in situ of left breast: Secondary | ICD-10-CM

## 2018-11-07 LAB — CBC WITH DIFFERENTIAL/PLATELET
Abs Immature Granulocytes: 0.02 10*3/uL (ref 0.00–0.07)
BASOS PCT: 1 %
Basophils Absolute: 0 10*3/uL (ref 0.0–0.1)
EOS ABS: 0.2 10*3/uL (ref 0.0–0.5)
EOS PCT: 4 %
HEMATOCRIT: 32 % — AB (ref 36.0–46.0)
Hemoglobin: 10.3 g/dL — ABNORMAL LOW (ref 12.0–15.0)
IMMATURE GRANULOCYTES: 1 %
LYMPHS ABS: 0.8 10*3/uL (ref 0.7–4.0)
Lymphocytes Relative: 18 %
MCH: 32.9 pg (ref 26.0–34.0)
MCHC: 32.2 g/dL (ref 30.0–36.0)
MCV: 102.2 fL — AB (ref 80.0–100.0)
MONO ABS: 0.6 10*3/uL (ref 0.1–1.0)
MONOS PCT: 13 %
Neutro Abs: 2.7 10*3/uL (ref 1.7–7.7)
Neutrophils Relative %: 63 %
PLATELETS: 285 10*3/uL (ref 150–400)
RBC: 3.13 MIL/uL — ABNORMAL LOW (ref 3.87–5.11)
RDW: 13.9 % (ref 11.5–15.5)
WBC: 4.3 10*3/uL (ref 4.0–10.5)
nRBC: 0 % (ref 0.0–0.2)

## 2018-11-07 LAB — COMPREHENSIVE METABOLIC PANEL
ALT: 19 U/L (ref 0–44)
AST: 26 U/L (ref 15–41)
Albumin: 4.3 g/dL (ref 3.5–5.0)
Alkaline Phosphatase: 60 U/L (ref 38–126)
Anion gap: 9 (ref 5–15)
BILIRUBIN TOTAL: 0.5 mg/dL (ref 0.3–1.2)
BUN: 21 mg/dL (ref 8–23)
CHLORIDE: 106 mmol/L (ref 98–111)
CO2: 21 mmol/L — ABNORMAL LOW (ref 22–32)
CREATININE: 1.24 mg/dL — AB (ref 0.44–1.00)
Calcium: 10 mg/dL (ref 8.9–10.3)
GFR, EST AFRICAN AMERICAN: 50 mL/min — AB (ref >60)
GFR, EST NON AFRICAN AMERICAN: 43 mL/min — AB (ref >60)
Glucose, Bld: 138 mg/dL — ABNORMAL HIGH (ref 70–99)
POTASSIUM: 3.8 mmol/L (ref 3.5–5.1)
Sodium: 136 mmol/L (ref 135–145)
TOTAL PROTEIN: 7.6 g/dL (ref 6.5–8.1)

## 2018-11-07 MED ORDER — POTASSIUM CHLORIDE CRYS ER 20 MEQ PO TBCR: 20.0000 meq | EXTENDED_RELEASE_TABLET | Freq: Two times a day (BID) | ORAL | 1 refills | Status: DC

## 2018-11-07 MED ORDER — ANASTROZOLE 1 MG PO TABS: 1.0000 mg | ORAL_TABLET | Freq: Every day | ORAL | 3 refills | Status: DC

## 2018-11-07 MED FILL — POTASSIUM CL ER 20 MEQ TABL: 20 | 90 days supply | Qty: 180 | Fill #0

## 2018-11-07 NOTE — Progress Notes (Signed)
[address], [place] [address]   CLINIC:  Medical Oncology/Hematology  PCP:  Mikey Kirschner, [place] [address] [phone]   REASON FOR VISIT: Follow-up for right breast cancer, ER+/PR+/HER2-  CURRENT THERAPY: Finished paclitaxel now Starting Arimidex waiting for surgery  BRIEF ONCOLOGIC HISTORY:    Breast cancer, right ([place])   05/12/2018 Initial Diagnosis    Breast cancer, right ([place])    06/02/2018 -  Chemotherapy    The patient had DOXOrubicin (ADRIAMYCIN) chemo injection 110 mg, 60 mg/m2 = 110 mg, Intravenous,  Once, 4 of 4 cycles Administration: 110 mg (06/05/2018), 110 mg (06/19/2018), 110 mg (07/07/2018), 110 mg (07/21/2018) palonosetron (ALOXI) injection 0.25 mg, 0.25 mg, Intravenous,  Once, 4 of 4 cycles Administration: 0.25 mg (06/05/2018), 0.25 mg (06/19/2018), 0.25 mg (07/07/2018), 0.25 mg (07/21/2018) pegfilgrastim-cbqv (UDENYCA) injection 6 mg, 6 mg, Subcutaneous, Once, 4 of 4 cycles Administration: 6 mg (06/06/2018), 6 mg (06/20/2018), 6 mg (07/08/2018), 6 mg (07/22/2018) cyclophosphamide (CYTOXAN) 1,100 mg in sodium chloride 0.9 % 250 mL chemo infusion, 600 mg/m2 = 1,100 mg, Intravenous,  Once, 4 of 4 cycles Administration: 1,100 mg (06/05/2018), 1,100 mg (06/19/2018), 1,100 mg (07/07/2018), 1,100 mg (07/21/2018) PACLitaxel (TAXOL) 144 mg in sodium chloride 0.9 % 250 mL chemo infusion (</= 63m/m2), 80 mg/m2 = 144 mg, Intravenous,  Once, 12 of 12 cycles Administration: 144 mg (08/07/2018), 144 mg (08/14/2018), 144 mg (08/21/2018), 144 mg (08/28/2018), 144 mg (09/04/2018), 144 mg (09/11/2018), 144 mg (09/18/2018), 144 mg (09/25/2018), 144 mg (10/02/2018), 144 mg (10/09/2018), 144 mg (10/17/2018), 144 mg (10/24/2018) fosaprepitant (EMEND) 150 mg, dexamethasone (DECADRON) 12 mg in sodium chloride 0.9 % 145 mL IVPB, , Intravenous,  Once, 4 of 4 cycles Administration:  (06/05/2018),  (06/19/2018),  (07/07/2018),  (07/21/2018)  for chemotherapy  treatment.       INTERVAL HISTORY:  Ms. [id number]. female returns for routine follow-up for right breast cancer. She is here today with her daughter. She is doing well since finishing her treatments. She still has fatigue and weakness throughout the day. She denies any nausea, vomiting, or diarrhea. Denies any numbness and tingling. Denies any new pains or lumps present. Denies any bleeding or dark stools. She reports her appetite at 100% and she drinks ensure 2 times a day to help maintain her weight. Her energy level at 75%.     REVIEW OF SYSTEMS:  Review of Systems  Constitutional: Positive for fatigue.  Neurological: Positive for extremity weakness.  All other systems reviewed and are negative.    PAST MEDICAL/SURGICAL HISTORY:  Past Medical History:  Diagnosis Date  . Asthma   . Hypertension    Past Surgical History:  Procedure Laterality Date  . ABDOMINAL HYSTERECTOMY    . PORTACATH PLACEMENT Right 06/04/2018   Procedure: INSERTION PORT-A-CATH;  Surgeon: JAviva Signs MD;  Location: [facility];  Service: General;  Laterality: Right;     SOCIAL HISTORY:  Social History   Socioeconomic History  . Marital status: Widowed    Spouse name: Not on file  . Number of children: Not on file  . Years of education: Not on file  . Highest education level: Not on file  Occupational History  . Not on file  Social Needs  . Financial resource strain: Not on file  . Food insecurity:    Worry: Not on file    Inability: Not on file  . Transportation needs:    Medical: Not on  file    Non-medical: Not on file  Tobacco Use  . Smoking status: Never Smoker  . Smokeless tobacco: Never Used  Substance and Sexual Activity  . Alcohol use: Never    Frequency: Never  . Drug use: Never  . Sexual activity: Not on file  Lifestyle  . Physical activity:    Days per week: Not on file    Minutes per session: Not on file  . Stress: Not on file  Relationships  . Social connections:     Talks on phone: Not on file    Gets together: Not on file    Attends religious service: Not on file    Active member of club or organization: Not on file    Attends meetings of clubs or organizations: Not on file    Relationship status: Not on file  . Intimate partner violence:    Fear of current or ex partner: Not on file    Emotionally abused: Not on file    Physically abused: Not on file    Forced sexual activity: Not on file  Other Topics Concern  . Not on file  Social History Narrative  . Not on file    FAMILY HISTORY:  History reviewed. No pertinent family history.  CURRENT MEDICATIONS:  Outpatient Encounter Medications as of 11/07/2018  Medication Sig  . albuterol (PROAIR HFA) 108 (90 Base) MCG/ACT inhaler INHALE TWO PUFFS INTO THE LUNGS EVERY SIX HOURS AS NEEDED FOR WHEEZING  . albuterol (PROVENTIL) (2.5 MG/3ML) 0.083% nebulizer solution Use via neb q 4 hrs prn wheezing (Patient taking differently: Take 2.5 mg by nebulization every 4 (four) hours as needed for wheezing or shortness of breath. Use via neb q 4 hrs prn wheezing)  . amLODipine (NORVASC) 5 MG tablet Take one tablet daily  . aspirin 81 MG tablet Take 81 mg by mouth daily.  . Cholecalciferol (VITAMIN D-3) 1000 UNITS CAPS Take 1 capsule by mouth daily.   . Flaxseed, Linseed, (FLAXSEED OIL) 1000 MG CAPS Take 1 capsule by mouth daily.   Marland Kitchen lidocaine-prilocaine (EMLA) cream Apply to affected area once  . montelukast (SINGULAIR) 10 MG tablet TAKE ONE TABLET BY MOUTH EVERY NIGHT AT BEDTIME  . nabumetone (RELAFEN) 750 MG tablet Take 1 tablet (750 mg total) by mouth 2 (two) times daily. With food (Patient taking differently: Take 750 mg by mouth 2 (two) times daily as needed for mild pain. With food)  . prochlorperazine (COMPAZINE) 10 MG tablet Take 1 tablet (10 mg total) by mouth every 6 (six) hours as needed (Nausea or vomiting).  . traMADol (ULTRAM) 50 MG tablet Take 1 tablet (50 mg total) by mouth every 6 (six) hours as  needed.  . triamcinolone cream (KENALOG) 0.1 % Apply twice daily to affected areas.  . [DISCONTINUED] potassium chloride SA (K-DUR,KLOR-CON) 20 MEQ tablet Take 1 tablet (20 mEq total) by mouth 2 (two) times daily.  Marland Kitchen anastrozole (ARIMIDEX) 1 MG tablet Take 1 tablet (1 mg total) by mouth daily.   No facility-administered encounter medications on file as of 11/07/2018.     ALLERGIES:  Allergies  Allergen Reactions  . Penicillins Other (See Comments)    Nausea, vomiting, dizziness Has patient had a PCN reaction causing immediate rash, facial/tongue/throat swelling, SOB or lightheadedness with hypotension: no Has patient had a PCN reaction causing severe rash involving mucus membranes or skin necrosis: no Has patient had a PCN reaction that required hospitalization: no Has patient had a PCN reaction occurring within the  last 10 years: no If all of the above answers are "NO", then may proceed with Cephalosporin use.   Marland Kitchen Percocet [Oxycodone-Acetaminophen]     Nausea, vomiting, diarrhea  . Ranitidine Itching     PHYSICAL EXAM:  ECOG Performance status: 1  Vitals:   11/07/18 1031  BP: 117/64  Pulse: (!) 103  Resp: 16  Temp: 98 F (36.7 C)  SpO2: 100%   Filed Weights   11/07/18 1031  Weight: 146 lb (66.2 kg)    Physical Exam  Constitutional: She is oriented to person, place, and time. She appears well-developed and well-nourished.  Musculoskeletal: Normal range of motion.  Neurological: She is alert and oriented to person, place, and time.  Skin: Skin is warm and dry.  Psychiatric: She has a normal mood and affect. Her behavior is normal. Judgment and thought content normal.     LABORATORY DATA:  I have reviewed the labs as listed.  CBC    Component Value Date/Time   WBC 4.3 11/07/2018 0959   RBC 3.13 (L) 11/07/2018 0959   HGB 10.3 (L) 11/07/2018 0959   HGB 12.4 02/07/2017 1109   HCT 32.0 (L) 11/07/2018 0959   HCT 38.2 02/07/2017 1109   PLT 285 11/07/2018 0959    PLT 335 02/07/2017 1109   MCV 102.2 (H) 11/07/2018 0959   MCV 86 02/07/2017 1109   MCH 32.9 11/07/2018 0959   MCHC 32.2 11/07/2018 0959   RDW 13.9 11/07/2018 0959   RDW 15.1 02/07/2017 1109   LYMPHSABS 0.8 11/07/2018 0959   LYMPHSABS 3.0 02/07/2017 1109   MONOABS 0.6 11/07/2018 0959   EOSABS 0.2 11/07/2018 0959   EOSABS 1.1 (H) 02/07/2017 1109   BASOSABS 0.0 11/07/2018 0959   BASOSABS 0.0 02/07/2017 1109   CMP Latest Ref Rng & Units 11/07/2018 10/24/2018 10/17/2018  Glucose 70 - 99 mg/dL 138(H) 139(H) 135(H)  BUN 8 - 23 mg/dL '21 16 16  ' Creatinine 0.44 - 1.00 mg/dL 1.24(H) 1.09(H) 1.11(H)  Sodium 135 - 145 mmol/L 136 136 136  Potassium 3.5 - 5.1 mmol/L 3.8 4.0 4.1  Chloride 98 - 111 mmol/L 106 102 103  CO2 22 - 32 mmol/L 21(L) 25 24  Calcium 8.9 - 10.3 mg/dL 10.0 9.8 9.9  Total Protein 6.5 - 8.1 g/dL 7.6 7.5 7.6  Total Bilirubin 0.3 - 1.2 mg/dL 0.5 0.8 0.5  Alkaline Phos 38 - 126 U/L 60 59 58  AST 15 - 41 U/L '26 22 26  ' ALT 0 - 44 U/L '19 20 18           ' ASSESSMENT & PLAN:   Breast cancer, right ([place]) 1.  Locally advanced (stage IIIa, T3N1) right breast cancer: - Mammogram on 03/28/2018 was BI-RADS Category 0.  Mammogram and ultrasound on 04/22/2018 shows 5.9 x 5 x 6 cm mass extending in the upper quadrant of the right breast and right axillary lymph node measuring 2.8 x 1.6 x 1.7 cm.  Left breast has retroareolar mass.  Patient did not have mammogram in the last 20 years. - Biopsy on 04/29/2018 of the right breast mass consistent with IDC, grade 2-3, lymph node biopsy positive for metastatic disease, ER/PR positive, HER-2 negative, Ki-67 of 5% -Seen by Dr. Arnoldo Morale who recommended right modified radical mastectomy and left simple mastectomy. - Bone scan showed  increased uptake in the right greater trochanter and right sacroiliac joint.  CT scan of the chest abdomen and pelvis did not show any evidence of metastatic disease.  The uptake in  the right greater trochanter was  thought to be due to enthesopathic change.  Uptake in the right sacroiliac joint was likely from degenerative changes at the right L5-S1 joint. - Due to advanced nature of her breast cancer, we have recommended neoadjuvant chemotherapy with dose dense AC followed by weekly paclitaxel for 12 weeks. - 4 cycles of dose dense AC from 06/05/2018 through 07/21/2018. - 12 cycles of weekly paclitaxel from 08/07/2018 through 10/23/2018.  Weekly paclitaxel started on 08/07/2018.  - Last physical examination revealed 4 to 5 cm mass in the right breast, softer, freely mobile.  Very small lymph node palpable in the right axilla.  Left breast has nonspecific mass in the center. -She was seen by Dr. Arnoldo Morale and was scheduled for right mastectomy and lymph node dissection and left simple mastectomy on 12/01/2018. -She reports that her energy levels are beginning to improve. -I have talked to her about starting her on aromatase inhibitor anastrozole.  We talked about side effects in detail including hot flashes, decreased bone mineral density.  We will order a bone density test as a baseline. -I have recommended her to take calcium and vitamin D twice daily. -I will see her back in 2 to 3 weeks after surgery to discuss the results.  2.  Left breast DCIS: -Biopsy on 04/29/2018 of the left breast shows DCIS, ER/PR positive.  Left axillary lymph node biopsy was negative for malignancy.  3.  Hypokalemia: -She will continue potassium supplements.  Potassium is 4.2 today.       Orders placed this encounter:  Orders Placed This Encounter  Procedures  . DG Bone Density  . CBC with Differential/Platelet  . Comprehensive metabolic panel      Derek Jack, MD [place] [phone]

## 2018-11-07 NOTE — Telephone Encounter (Signed)
Reordered pts 90 day supply of potassium from [employer]. Bal of $23.01 will be billed to [address]

## 2018-11-07 NOTE — Patient Instructions (Signed)
[facility] Discharge Instructions     Thank you for choosing [facility] to provide your oncology and hematology care.  To afford each patient quality time with our provider, please arrive at least 15 minutes before your scheduled appointment time.   If you have a lab appointment with the Cancer Center please come in thru the  Main Entrance and check in at the main information desk  You need to re-schedule your appointment should you arrive 10 or more minutes late.  We strive to give you quality time with our providers, and arriving late affects you and other patients whose appointments are after yours.  Also, if you no show three or more times for appointments you may be dismissed from the clinic at the providers discretion.     Again, thank you for choosing [facility].  Our hope is that these requests will decrease the amount of time that you wait before being seen by our physicians.       _____________________________________________________________  Should you have questions after your visit to  Cancer Center, please contact our office at [phone] between the hours of 8:00 a.m. and 4:30 p.m.  Voicemails left after 4:00 p.m. will not be returned until the following business day.  For prescription refill requests, have your pharmacy contact our office and allow 72 hours.    Cancer Center Support Programs:   > Cancer Support Group  2nd Tuesday of the month 1pm-2pm, Journey Room    

## 2018-11-07 NOTE — Assessment & Plan Note (Signed)
1.  Locally advanced (stage IIIa, T3N1) right breast cancer: - Mammogram on 03/28/2018 was BI-RADS Category 0.  Mammogram and ultrasound on 04/22/2018 shows 5.9 x 5 x 6 cm mass extending in the upper quadrant of the right breast and right axillary lymph node measuring 2.8 x 1.6 x 1.7 cm.  Left breast has retroareolar mass.  Patient did not have mammogram in the last 20 years. - Biopsy on 04/29/2018 of the right breast mass consistent with IDC, grade 2-3, lymph node biopsy positive for metastatic disease, ER/PR positive, HER-2 negative, Ki-67 of 5% -Seen by Dr. Arnoldo Morale who recommended right modified radical mastectomy and left simple mastectomy. - Bone scan showed  increased uptake in the right greater trochanter and right sacroiliac joint.  CT scan of the chest abdomen and pelvis did not show any evidence of metastatic disease.  The uptake in the right greater trochanter was thought to be due to enthesopathic change.  Uptake in the right sacroiliac joint was likely from degenerative changes at the right L5-S1 joint. - Due to advanced nature of her breast cancer, we have recommended neoadjuvant chemotherapy with dose dense AC followed by weekly paclitaxel for 12 weeks. - 4 cycles of dose dense AC from 06/05/2018 through 07/21/2018. - 12 cycles of weekly paclitaxel from 08/07/2018 through 10/23/2018.  Weekly paclitaxel started on 08/07/2018.  - Last physical examination revealed 4 to 5 cm mass in the right breast, softer, freely mobile.  Very small lymph node palpable in the right axilla.  Left breast has nonspecific mass in the center. -She was seen by Dr. Arnoldo Morale and was scheduled for right mastectomy and lymph node dissection and left simple mastectomy on 12/01/2018. -She reports that her energy levels are beginning to improve. -I have talked to her about starting her on aromatase inhibitor anastrozole.  We talked about side effects in detail including hot flashes, decreased bone mineral density.  We will order a  bone density test as a baseline. -I have recommended her to take calcium and vitamin D twice daily. -I will see her back in 2 to 3 weeks after surgery to discuss the results.  2.  Left breast DCIS: -Biopsy on 04/29/2018 of the left breast shows DCIS, ER/PR positive.  Left axillary lymph node biopsy was negative for malignancy.  3.  Hypokalemia: -She will continue potassium supplements.  Potassium is 4.2 today.

## 2018-11-13 ENCOUNTER — Ambulatory Visit (HOSPITAL_COMMUNITY)
Admission: RE | Admit: 2018-11-13 | Discharge: 2018-11-13 | Disposition: A | Discharge status: Home / Self Care | Payer: Medicare Other | Source: Ambulatory Visit | Attending: Nurse Practitioner | Admitting: Nurse Practitioner

## 2018-11-13 DIAGNOSIS — Z13820 Encounter for screening for osteoporosis: Secondary | ICD-10-CM | POA: Diagnosis not present

## 2018-11-13 DIAGNOSIS — Z780 Asymptomatic menopausal state: Secondary | ICD-10-CM | POA: Diagnosis not present

## 2018-11-13 DIAGNOSIS — Z170 Estrogen receptor positive status [ER+]: Secondary | ICD-10-CM | POA: Insufficient documentation

## 2018-11-13 DIAGNOSIS — C50811 Malignant neoplasm of overlapping sites of right female breast: Secondary | ICD-10-CM | POA: Diagnosis not present

## 2018-11-17 NOTE — Patient Instructions (Signed)
Whitney Velez  11/17/2018     @PREFPERIOPPHARMACY @   Your procedure is scheduled on  12/01/2018   Report to [facility] at  37   A.M.  Call this number if you have problems the morning of surgery:  [phone]   Remember:  Do not eat or drink after midnight.    Take these medicines the morning of surgery with A SIP OF WATER  Amlodipine, relafen(if needed), compazine ( if needed), Ultram ( if needed). Use your inhaler and your nebulizer before you come.    Do not wear jewelry, make-up or nail polish.  Do not wear lotions, powders, or perfumes, or deodorant.  Do not shave 48 hours prior to surgery.  Men may shave face and neck.  Do not bring valuables to the hospital.  [facility] is not responsible for any belongings or valuables.  Contacts, dentures or bridgework may not be worn into surgery.  Leave your suitcase in the car.  After surgery it may be brought to your room.  For patients admitted to the hospital, discharge time will be determined by your treatment team.  Patients discharged the day of surgery will not be allowed to drive home.   Name and phone number of your driver:   daughter Special instructions:  None  Please read over the following fact sheets that you were given. Pain Booklet, Coughing and Deep Breathing, Blood Transfusion Information, Lab Information, Surgical Site Infection Prevention, Anesthesia Post-op Instructions and Care and Recovery After Surgery      Total or Modified Radical Mastectomy A total mastectomy and a modified radical mastectomy are types of surgery for breast cancer. If you are having a total mastectomy (simple mastectomy), your entire breast will be removed. If you are having a modified radical mastectomy, your breast and nipple will be removed along with the lymph nodes under your arm. You may also have some of the lining over the muscle tissues under your breast removed. Let your health care provider know  about:  Any allergies you have.  All medicines you are taking, including vitamins, herbs, eye drops, creams, and over-the-counter medicines.  Previous problems you or members of your family have had with the use of anesthetics.  Any blood disorders you have.  Any surgeries you have had.  Any medical conditions you have. What are the risks? Generally, this is a safe procedure. However, problems may occur, including:  Pain.  Infection.  Bleeding.  Scar tissue.  Chest numbness on the side of the surgery.  Fluid buildup under the skin flaps where your breast was removed (seroma).  Sensation of throbbing or tingling.  Stress or sadness from losing your breast.  If you have the lymph nodes under your arm removed, you may have arm swelling, weakness, or numbness on the same side of your body as your surgery. What happens before the procedure?  Ask your health care provider about: ? Changing or stopping your regular medicines. This is especially important if you are taking diabetes medicines or blood thinners. ? Taking medicines such as aspirin and ibuprofen. These medicines can thin your blood. Do not take these medicines before your procedure if your health care provider instructs you not to.  Follow your health care provider's instructions about eating or drinking restrictions.  You may be checked for extra fluid around your lymph nodes (lymphedema).  Plan to have someone take you home after the procedure. What happens during  the procedure?  An IV tube will be inserted into one of your veins.  You will be given a medicine that makes you fall asleep (general anesthetic).  Your breast will be cleaned with a germ-killing solution (antiseptic).  A wide incision will be made around your nipple. The skin and nipple inside the incision will be removed along with all breast tissue.  If you are having a modified radical mastectomy: ? The lining over your chest muscles will be  removed. ? The incision may be extended to reach the lymph nodes under your arm, or a second incision may be made. ? The lymph nodes will be removed.  You may have a drainage tube inserted into your incision to collect fluid that builds up after surgery. This tube is connected to a suction bulb.  Your incision or incisions will be closed with stitches (sutures).  A bandage (dressing) will be placed over your breast and under your arm. The procedure may vary among health care providers and hospitals. What happens after the procedure?  You will be moved to a recovery area.  Your blood pressure, heart rate, breathing rate, and blood oxygen level will be monitored often until the medicines you were given have worn off.  You will be given pain medicine as needed.  After a while, you will be taken to a hospital room.  You will be encouraged to get up and walk as soon as you can.  Your IV tube can be removed when you are able to eat and drink.  Your drain may be removed before you go home from the hospital, or you may be sent home with your drain and suction bulb. This information is not intended to replace advice given to you by your health care provider. Make sure you discuss any questions you have with your health care provider. Document Released: 09/11/2001 Document Revised: 08/23/2016 Document Reviewed: 09/01/2014 Elsevier Interactive Patient Education  2018 [place]. Total or Modified Radical Mastectomy, Care After Refer to this sheet in the next few weeks. These instructions provide you with information about caring for yourself after your procedure. Your health care provider may also give you more specific instructions. Your treatment has been planned according to current medical practices, but problems sometimes occur. Call your health care provider if you have any problems or questions after your procedure. What can I expect after the procedure? After your procedure, it is  common to have:  Pain.  Numbness.  Stiffness in your arm or shoulder.  Feelings of stress, sadness, or depression.  If the lymph nodes under your arm were removed, you may have arm swelling, weakness, or numbness on the same side of your body as your surgery. Follow these instructions at home: Incision care  There are many different ways to close and cover an incision, including stitches, skin glue, and adhesive strips. Follow your health care provider's instructions about: ? Incision care. ? Bandage (dressing) changes and removal. ? Incision closure removal.  Check your incision area every day for signs of infection. Watch for: ? Redness, swelling, or pain. ? Fluid, blood, or pus.  If you were sent home with a surgical drain in place, follow your health care provider's instructions for emptying it. Bathing  Do not take baths, swim, or use a hot tub until your health care provider approves.  Take sponge baths until your health care provider says that you can start showering or bathing. Activity  Return to your normal activities as directed  by your health care provider.  Avoid strenuous exercise.  Be careful to avoid any activities that could cause an injury to your arm on the side of your surgery.  Do not lift anything that is heavier than 10 lb (4.5 kg). Avoid lifting with the arm that is on the side of your surgery.  Do not carry heavy objects on your shoulder.  After your drain is removed, you should perform exercises to keep your arm from getting stiff and swollen. Talk with your health care provider about which exercises are safe for you. General instructions  Take medicines only as directed by your health care provider.  You may eat what you usually do.  Keep your arm elevated when at rest.  Do not wear tight jewelry on your arm, wrist, or fingers on the side of your surgery.  Get checked for extra fluid around your lymph nodes (lymphedema) as often as told  by your health care provider.  If you had a modified radical mastectomy, always let your health care providers know that lymph nodes under your arm were removed. This is important information to share before you are involved in certain procedures, such as giving blood or having your blood pressure taken. Contact a health care provider if:  You have a fever.  Your pain medicine is not working.  Your arm swelling, weakness, or numbness has not improved after a few weeks.  You have new swelling in your breast or arm.  You have redness, swelling, or pain in your incision area.  You have fluid, blood, or pus coming from your incision. Get help right away if:  You have very bad pain in your breast or arm.  You have chest pain.  You have difficulty breathing. This information is not intended to replace advice given to you by your health care provider. Make sure you discuss any questions you have with your health care provider. Document Released: 08/09/2004 Document Revised: 08/23/2016 Document Reviewed: 09/01/2014 Elsevier Interactive Patient Education  2018 [place] Anesthesia, Adult General anesthesia is the use of medicines to make a person "go to sleep" (be unconscious) for a medical procedure. General anesthesia is often recommended when a procedure:  Is long.  Requires you to be still or in an unusual position.  Is major and can cause you to lose blood.  Is impossible to do without general anesthesia.  The medicines used for general anesthesia are called general anesthetics. In addition to making you sleep, the medicines:  Prevent pain.  Control your blood pressure.  Relax your muscles.  Tell a health care provider about:  Any allergies you have.  All medicines you are taking, including vitamins, herbs, eye drops, creams, and over-the-counter medicines.  Any problems you or family members have had with anesthetic medicines.  Types of anesthetics you  have had in the past.  Any bleeding disorders you have.  Any surgeries you have had.  Any medical conditions you have.  Any history of heart or lung conditions, such as heart failure, sleep apnea, or chronic obstructive pulmonary disease (COPD).  Whether you are pregnant or may be pregnant.  Whether you use tobacco, alcohol, marijuana, or street drugs.  Any history of Armed forces logistics/support/administrative officer.  Any history of depression or anxiety. What are the risks? Generally, this is a safe procedure. However, problems may occur, including:  Allergic reaction to anesthetics.  Lung and heart problems.  Inhaling food or liquids from your stomach into your lungs (aspiration).  Injury to nerves.  Waking up during your procedure and being unable to move (rare).  Extreme agitation or a state of mental confusion (delirium) when you wake up from the anesthetic.  Air in the bloodstream, which can lead to stroke.  These problems are more likely to develop if you are having a major surgery or if you have an advanced medical condition. You can prevent some of these complications by answering all of your health care provider's questions thoroughly and by following all pre-procedure instructions. General anesthesia can cause side effects, including:  Nausea or vomiting  A sore throat from the breathing tube.  Feeling cold or shivery.  Feeling tired, washed out, or achy.  Sleepiness or drowsiness.  Confusion or agitation.  What happens before the procedure? Staying hydrated Follow instructions from your health care provider about hydration, which may include:  Up to 2 hours before the procedure - you may continue to drink clear liquids, such as water, clear fruit juice, black coffee, and plain tea.  Eating and drinking restrictions Follow instructions from your health care provider about eating and drinking, which may include:  8 hours before the procedure - stop eating heavy meals or foods such  as meat, fried foods, or fatty foods.  6 hours before the procedure - stop eating light meals or foods, such as toast or cereal.  6 hours before the procedure - stop drinking milk or drinks that contain milk.  2 hours before the procedure - stop drinking clear liquids.  Medicines  Ask your health care provider about: ? Changing or stopping your regular medicines. This is especially important if you are taking diabetes medicines or blood thinners. ? Taking medicines such as aspirin and ibuprofen. These medicines can thin your blood. Do not take these medicines before your procedure if your health care provider instructs you not to. ? Taking new dietary supplements or medicines. Do not take these during the week before your procedure unless your health care provider approves them.  If you are told to take a medicine or to continue taking a medicine on the day of the procedure, take the medicine with sips of water. General instructions   Ask if you will be going home the same day, the following day, or after a longer hospital stay. ? Plan to have someone take you home. ? Plan to have someone stay with you for the first 24 hours after you leave the hospital or clinic.  For 3-6 weeks before the procedure, try not to use any tobacco products, such as cigarettes, chewing tobacco, and e-cigarettes.  You may brush your teeth on the morning of the procedure, but make sure to spit out the toothpaste. What happens during the procedure?  You will be given anesthetics through a mask and through an IV tube in one of your veins.  You may receive medicine to help you relax (sedative).  As soon as you are asleep, a breathing tube may be used to help you breathe.  An anesthesia specialist will stay with you throughout the procedure. He or she will help keep you comfortable and safe by continuing to give you medicines and adjusting the amount of medicine that you get. He or she will also watch your  blood pressure, pulse, and oxygen levels to make sure that the anesthetics do not cause any problems.  If a breathing tube was used to help you breathe, it will be removed before you wake up. The procedure may vary among health  care providers and hospitals. What happens after the procedure?  You will wake up, often slowly, after the procedure is complete, usually in a recovery area.  Your blood pressure, heart rate, breathing rate, and blood oxygen level will be monitored until the medicines you were given have worn off.  You may be given medicine to help you calm down if you feel anxious or agitated.  If you will be going home the same day, your health care provider may check to make sure you can stand, drink, and urinate.  Your health care providers will treat your pain and side effects before you go home.  Do not drive for 24 hours if you received a sedative.  You may: ? Feel nauseous and vomit. ? Have a sore throat. ? Have mental slowness. ? Feel cold or shivery. ? Feel sleepy. ? Feel tired. ? Feel sore or achy, even in parts of your body where you did not have surgery. This information is not intended to replace advice given to you by your health care provider. Make sure you discuss any questions you have with your health care provider. Document Released: 03/25/2008 Document Revised: 05/29/2016 Document Reviewed: 12/01/2015 Elsevier Interactive Patient Education  2018 [place] Anesthesia, Adult, Care After These instructions provide you with information about caring for yourself after your procedure. Your health care provider may also give you more specific instructions. Your treatment has been planned according to current medical practices, but problems sometimes occur. Call your health care provider if you have any problems or questions after your procedure. What can I expect after the procedure? After the procedure, it is common to have:  Vomiting.  A sore  throat.  Mental slowness.  It is common to feel:  Nauseous.  Cold or shivery.  Sleepy.  Tired.  Sore or achy, even in parts of your body where you did not have surgery.  Follow these instructions at home: For at least 24 hours after the procedure:  Do not: ? Participate in activities where you could fall or become injured. ? Drive. ? Use heavy machinery. ? Drink alcohol. ? Take sleeping pills or medicines that cause drowsiness. ? Make important decisions or sign legal documents. ? Take care of children on your own.  Rest. Eating and drinking  If you vomit, drink water, juice, or soup when you can drink without vomiting.  Drink enough fluid to keep your urine clear or pale yellow.  Make sure you have little or no nausea before eating solid foods.  Follow the diet recommended by your health care provider. General instructions  Have a responsible adult stay with you until you are awake and alert.  Return to your normal activities as told by your health care provider. Ask your health care provider what activities are safe for you.  Take over-the-counter and prescription medicines only as told by your health care provider.  If you smoke, do not smoke without supervision.  Keep all follow-up visits as told by your health care provider. This is important. Contact a health care provider if:  You continue to have nausea or vomiting at home, and medicines are not helpful.  You cannot drink fluids or start eating again.  You cannot urinate after 8-12 hours.  You develop a skin rash.  You have fever.  You have increasing redness at the site of your procedure. Get help right away if:  You have difficulty breathing.  You have chest pain.  You have unexpected bleeding.  You feel that you are having a life-threatening or urgent problem. This information is not intended to replace advice given to you by your health care provider. Make sure you discuss any  questions you have with your health care provider. Document Released: 03/25/2001 Document Revised: 05/21/2016 Document Reviewed: 12/01/2015 Elsevier Interactive Patient Education  [employer].

## 2018-11-20 ENCOUNTER — Other Ambulatory Visit (HOSPITAL_COMMUNITY): Payer: Medicare Other

## 2018-11-20 ENCOUNTER — Ambulatory Visit (HOSPITAL_COMMUNITY): Payer: Medicare Other | Admitting: Hematology

## 2018-11-24 ENCOUNTER — Inpatient Hospital Stay (HOSPITAL_COMMUNITY): Admission: RE | Admit: 2018-11-24 | Payer: Medicare Other | Source: Ambulatory Visit

## 2018-11-26 ENCOUNTER — Encounter (HOSPITAL_COMMUNITY)
Admission: RE | Admit: 2018-11-26 | Discharge: 2018-11-26 | Disposition: A | Discharge status: Home / Self Care | Payer: Medicare Other | Source: Ambulatory Visit | Attending: General Surgery | Admitting: General Surgery

## 2018-11-26 DIAGNOSIS — Z01812 Encounter for preprocedural laboratory examination: Secondary | ICD-10-CM | POA: Diagnosis not present

## 2018-11-26 LAB — COMPREHENSIVE METABOLIC PANEL
ALT: 12 U/L (ref 0–44)
AST: 21 U/L (ref 15–41)
Albumin: 4 g/dL (ref 3.5–5.0)
Alkaline Phosphatase: 65 U/L (ref 38–126)
Anion gap: 9 (ref 5–15)
BUN: 19 mg/dL (ref 8–23)
CO2: 20 mmol/L — ABNORMAL LOW (ref 22–32)
Calcium: 9.9 mg/dL (ref 8.9–10.3)
Chloride: 107 mmol/L (ref 98–111)
Creatinine, Ser: 1.22 mg/dL — ABNORMAL HIGH (ref 0.44–1.00)
GFR, EST AFRICAN AMERICAN: 52 mL/min — AB (ref >60)
GFR, EST NON AFRICAN AMERICAN: 45 mL/min — AB (ref >60)
GLUCOSE: 85 mg/dL (ref 70–99)
POTASSIUM: 4.2 mmol/L (ref 3.5–5.1)
Sodium: 136 mmol/L (ref 135–145)
Total Bilirubin: 0.8 mg/dL (ref 0.3–1.2)
Total Protein: 7.6 g/dL (ref 6.5–8.1)

## 2018-11-26 LAB — ABO/RH: ABO/RH(D): A POS

## 2018-12-01 ENCOUNTER — Ambulatory Visit (HOSPITAL_COMMUNITY): Payer: Medicare Other | Admitting: Anesthesiology

## 2018-12-01 ENCOUNTER — Encounter (HOSPITAL_COMMUNITY)
Admission: RE | Disposition: A | Discharge status: Home / Self Care | Payer: Self-pay | Source: Home / Self Care | Attending: General Surgery

## 2018-12-01 ENCOUNTER — Inpatient Hospital Stay (HOSPITAL_COMMUNITY)
Admission: RE | Admit: 2018-12-01 | Discharge: 2018-12-03 | DRG: 580 | Disposition: A | Discharge status: Home / Self Care | Payer: Medicare Other | Attending: General Surgery | Admitting: General Surgery

## 2018-12-01 ENCOUNTER — Other Ambulatory Visit: Payer: Self-pay

## 2018-12-01 ENCOUNTER — Encounter (HOSPITAL_COMMUNITY): Payer: Self-pay | Admitting: *Deleted

## 2018-12-01 DIAGNOSIS — Z888 Allergy status to other drugs, medicaments and biological substances status: Secondary | ICD-10-CM

## 2018-12-01 DIAGNOSIS — Z7984 Long term (current) use of oral hypoglycemic drugs: Secondary | ICD-10-CM | POA: Diagnosis not present

## 2018-12-01 DIAGNOSIS — I10 Essential (primary) hypertension: Secondary | ICD-10-CM | POA: Diagnosis present

## 2018-12-01 DIAGNOSIS — Z880 Allergy status to penicillin: Secondary | ICD-10-CM

## 2018-12-01 DIAGNOSIS — C50911 Malignant neoplasm of unspecified site of right female breast: Secondary | ICD-10-CM | POA: Diagnosis not present

## 2018-12-01 DIAGNOSIS — Z170 Estrogen receptor positive status [ER+]: Secondary | ICD-10-CM | POA: Diagnosis not present

## 2018-12-01 DIAGNOSIS — D0512 Intraductal carcinoma in situ of left breast: Secondary | ICD-10-CM | POA: Diagnosis not present

## 2018-12-01 DIAGNOSIS — Z7982 Long term (current) use of aspirin: Secondary | ICD-10-CM | POA: Diagnosis not present

## 2018-12-01 DIAGNOSIS — Z79899 Other long term (current) drug therapy: Secondary | ICD-10-CM | POA: Diagnosis not present

## 2018-12-01 DIAGNOSIS — D62 Acute posthemorrhagic anemia: Secondary | ICD-10-CM | POA: Diagnosis not present

## 2018-12-01 DIAGNOSIS — C779 Secondary and unspecified malignant neoplasm of lymph node, unspecified: Secondary | ICD-10-CM | POA: Diagnosis not present

## 2018-12-01 DIAGNOSIS — Z9221 Personal history of antineoplastic chemotherapy: Secondary | ICD-10-CM

## 2018-12-01 DIAGNOSIS — C50912 Malignant neoplasm of unspecified site of left female breast: Secondary | ICD-10-CM | POA: Diagnosis not present

## 2018-12-01 DIAGNOSIS — E118 Type 2 diabetes mellitus with unspecified complications: Secondary | ICD-10-CM | POA: Diagnosis present

## 2018-12-01 DIAGNOSIS — Z9013 Acquired absence of bilateral breasts and nipples: Secondary | ICD-10-CM

## 2018-12-01 DIAGNOSIS — E119 Type 2 diabetes mellitus without complications: Secondary | ICD-10-CM | POA: Diagnosis not present

## 2018-12-01 DIAGNOSIS — J45909 Unspecified asthma, uncomplicated: Secondary | ICD-10-CM | POA: Diagnosis present

## 2018-12-01 DIAGNOSIS — C50811 Malignant neoplasm of overlapping sites of right female breast: Secondary | ICD-10-CM | POA: Diagnosis not present

## 2018-12-01 DIAGNOSIS — R011 Cardiac murmur, unspecified: Secondary | ICD-10-CM | POA: Diagnosis present

## 2018-12-01 DIAGNOSIS — Z885 Allergy status to narcotic agent status: Secondary | ICD-10-CM | POA: Diagnosis not present

## 2018-12-01 HISTORY — PX: SIMPLE MASTECTOMY WITH AXILLARY SENTINEL NODE BIOPSY: SHX6098

## 2018-12-01 HISTORY — PX: MASTECTOMY MODIFIED RADICAL: SHX5962

## 2018-12-01 SURGERY — MASTECTOMY, MODIFIED RADICAL
Anesthesia: General | Site: Breast | Laterality: Right

## 2018-12-01 MED ORDER — VANCOMYCIN HCL IN DEXTROSE 1-5 GM/200ML-% IV SOLN
1000.0000 mg | INTRAVENOUS | Status: AC
  Administered 2018-12-01: 1000 mg via INTRAVENOUS
  Filled 2018-12-01: qty 200

## 2018-12-01 MED ORDER — KETOROLAC TROMETHAMINE 30 MG/ML IJ SOLN
INTRAMUSCULAR | Status: AC
  Filled 2018-12-01: qty 1

## 2018-12-01 MED ORDER — EPHEDRINE SULFATE 50 MG/ML IJ SOLN
INTRAMUSCULAR | Status: DC | PRN
  Administered 2018-12-01: 10 mg via INTRAVENOUS

## 2018-12-01 MED ORDER — LIDOCAINE HCL (CARDIAC) PF 50 MG/5ML IV SOSY
PREFILLED_SYRINGE | INTRAVENOUS | Status: DC | PRN
  Administered 2018-12-01: 50 mg via INTRAVENOUS

## 2018-12-01 MED ORDER — BUPIVACAINE HCL (PF) 0.5 % IJ SOLN
INTRAMUSCULAR | Status: AC
  Filled 2018-12-01: qty 30

## 2018-12-01 MED ORDER — LIDOCAINE 2% (20 MG/ML) 5 ML SYRINGE
INTRAMUSCULAR | Status: AC
  Filled 2018-12-01: qty 10

## 2018-12-01 MED ORDER — ONDANSETRON 4 MG PO TBDP: 4.0000 mg | ORAL_TABLET | Freq: Four times a day (QID) | ORAL | Status: DC | PRN

## 2018-12-01 MED ORDER — BUPIVACAINE LIPOSOME 1.3 % IJ SUSP
INTRAMUSCULAR | Status: AC
  Filled 2018-12-01: qty 20

## 2018-12-01 MED ORDER — LACTATED RINGERS IV SOLN: INTRAVENOUS | Status: DC

## 2018-12-01 MED ORDER — 0.9 % SODIUM CHLORIDE (POUR BTL) OPTIME
TOPICAL | Status: DC | PRN
  Administered 2018-12-01 (×2): 1000 mL

## 2018-12-01 MED ORDER — SODIUM CHLORIDE 0.9% FLUSH
INTRAVENOUS | Status: AC
  Filled 2018-12-01: qty 10

## 2018-12-01 MED ORDER — FENTANYL CITRATE (PF) 100 MCG/2ML IJ SOLN
INTRAMUSCULAR | Status: DC | PRN
  Administered 2018-12-01: 25 ug via INTRAVENOUS
  Administered 2018-12-01: 50 ug via INTRAVENOUS
  Administered 2018-12-01 (×3): 25 ug via INTRAVENOUS
  Administered 2018-12-01: 50 ug via INTRAVENOUS
  Administered 2018-12-01 (×3): 25 ug via INTRAVENOUS

## 2018-12-01 MED ORDER — SIMETHICONE 80 MG PO CHEW: 40.0000 mg | CHEWABLE_TABLET | Freq: Four times a day (QID) | ORAL | Status: DC | PRN

## 2018-12-01 MED ORDER — SODIUM CHLORIDE 0.9 % IV SOLN
INTRAVENOUS | Status: DC
  Administered 2018-12-01 – 2018-12-02 (×2): via INTRAVENOUS

## 2018-12-01 MED ORDER — ONDANSETRON HCL 4 MG/2ML IJ SOLN: 4.0000 mg | Freq: Four times a day (QID) | INTRAMUSCULAR | Status: DC | PRN

## 2018-12-01 MED ORDER — ROCURONIUM BROMIDE 10 MG/ML (PF) SYRINGE
PREFILLED_SYRINGE | INTRAVENOUS | Status: AC
  Filled 2018-12-01: qty 10

## 2018-12-01 MED ORDER — MIDAZOLAM HCL 5 MG/5ML IJ SOLN
INTRAMUSCULAR | Status: DC | PRN
  Administered 2018-12-01: 1 mg via INTRAVENOUS

## 2018-12-01 MED ORDER — ONDANSETRON HCL 4 MG/2ML IJ SOLN
INTRAMUSCULAR | Status: AC
  Filled 2018-12-01: qty 2

## 2018-12-01 MED ORDER — PROPOFOL 10 MG/ML IV BOLUS
INTRAVENOUS | Status: DC | PRN
  Administered 2018-12-01: 150 mg via INTRAVENOUS

## 2018-12-01 MED ORDER — ONDANSETRON HCL 4 MG/2ML IJ SOLN
INTRAMUSCULAR | Status: DC | PRN
  Administered 2018-12-01: 4 mg via INTRAVENOUS

## 2018-12-01 MED ORDER — KETOROLAC TROMETHAMINE 30 MG/ML IJ SOLN
15.0000 mg | Freq: Four times a day (QID) | INTRAMUSCULAR | Status: DC | PRN
  Administered 2018-12-02 – 2018-12-03 (×5): 15 mg via INTRAVENOUS
  Filled 2018-12-01 (×6): qty 1

## 2018-12-01 MED ORDER — MORPHINE SULFATE (PF) 2 MG/ML IV SOLN: 2.0000 mg | INTRAVENOUS | Status: DC | PRN

## 2018-12-01 MED ORDER — ALBUTEROL SULFATE (2.5 MG/3ML) 0.083% IN NEBU: 2.5000 mg | INHALATION_SOLUTION | RESPIRATORY_TRACT | Status: DC | PRN

## 2018-12-01 MED ORDER — ACETAMINOPHEN 325 MG PO TABS: 650.0000 mg | ORAL_TABLET | Freq: Four times a day (QID) | ORAL | Status: DC | PRN

## 2018-12-01 MED ORDER — HYDROCODONE-ACETAMINOPHEN 7.5-325 MG PO TABS: 1.0000 | ORAL_TABLET | Freq: Once | ORAL | Status: DC | PRN

## 2018-12-01 MED ORDER — MIDAZOLAM HCL 2 MG/2ML IJ SOLN
INTRAMUSCULAR | Status: AC
  Filled 2018-12-01: qty 2

## 2018-12-01 MED ORDER — POVIDONE-IODINE 10 % EX OINT
TOPICAL_OINTMENT | CUTANEOUS | Status: AC
  Filled 2018-12-01: qty 1

## 2018-12-01 MED ORDER — CHLORHEXIDINE GLUCONATE CLOTH 2 % EX PADS: 6.0000 | MEDICATED_PAD | Freq: Once | CUTANEOUS | Status: DC

## 2018-12-01 MED ORDER — PROPOFOL 10 MG/ML IV BOLUS
INTRAVENOUS | Status: AC
  Filled 2018-12-01: qty 20

## 2018-12-01 MED ORDER — AMLODIPINE BESYLATE 5 MG PO TABS
5.0000 mg | ORAL_TABLET | Freq: Every day | ORAL | Status: DC
  Administered 2018-12-01 – 2018-12-03 (×3): 5 mg via ORAL
  Filled 2018-12-01 (×3): qty 1

## 2018-12-01 MED ORDER — BUPIVACAINE LIPOSOME 1.3 % IJ SUSP
INTRAMUSCULAR | Status: DC | PRN
  Administered 2018-12-01: 20 mL

## 2018-12-01 MED ORDER — ARTIFICIAL TEARS OPHTHALMIC OINT
TOPICAL_OINTMENT | OPHTHALMIC | Status: AC
  Filled 2018-12-01: qty 3.5

## 2018-12-01 MED ORDER — PROMETHAZINE HCL 25 MG/ML IJ SOLN
6.2500 mg | INTRAMUSCULAR | Status: DC | PRN
  Administered 2018-12-01: 6.25 mg via INTRAVENOUS
  Filled 2018-12-01: qty 1

## 2018-12-01 MED ORDER — KETOROLAC TROMETHAMINE 30 MG/ML IJ SOLN
15.0000 mg | Freq: Four times a day (QID) | INTRAMUSCULAR | Status: AC
  Administered 2018-12-01: 15 mg via INTRAVENOUS
  Filled 2018-12-01: qty 1

## 2018-12-01 MED ORDER — ACETAMINOPHEN 650 MG RE SUPP: 650.0000 mg | Freq: Four times a day (QID) | RECTAL | Status: DC | PRN

## 2018-12-01 MED ORDER — EPHEDRINE 5 MG/ML INJ
INTRAVENOUS | Status: AC
  Filled 2018-12-01: qty 10

## 2018-12-01 MED ORDER — NEOSTIGMINE METHYLSULFATE 3 MG/3ML IV SOSY
PREFILLED_SYRINGE | INTRAVENOUS | Status: AC
  Filled 2018-12-01: qty 3

## 2018-12-01 MED ORDER — FENTANYL CITRATE (PF) 250 MCG/5ML IJ SOLN
INTRAMUSCULAR | Status: AC
  Filled 2018-12-01: qty 5

## 2018-12-01 MED ORDER — HYDROMORPHONE HCL 1 MG/ML IJ SOLN: 0.2500 mg | INTRAMUSCULAR | Status: DC | PRN

## 2018-12-01 MED ORDER — LACTATED RINGERS IV SOLN
INTRAVENOUS | Status: DC | PRN
  Administered 2018-12-01 (×2): via INTRAVENOUS

## 2018-12-01 MED ORDER — FENTANYL CITRATE (PF) 100 MCG/2ML IJ SOLN
INTRAMUSCULAR | Status: AC
  Filled 2018-12-01: qty 2

## 2018-12-01 MED ORDER — MEPERIDINE HCL 50 MG/ML IJ SOLN: 6.2500 mg | INTRAMUSCULAR | Status: DC | PRN

## 2018-12-01 MED ORDER — ANASTROZOLE 1 MG PO TABS
1.0000 mg | ORAL_TABLET | Freq: Every day | ORAL | Status: DC
  Administered 2018-12-02 – 2018-12-03 (×2): 1 mg via ORAL
  Filled 2018-12-01 (×4): qty 1

## 2018-12-01 MED ORDER — ALBUTEROL SULFATE HFA 108 (90 BASE) MCG/ACT IN AERS: 2.0000 | INHALATION_SPRAY | Freq: Four times a day (QID) | RESPIRATORY_TRACT | Status: DC | PRN

## 2018-12-01 MED ORDER — ENOXAPARIN SODIUM 40 MG/0.4ML ~~LOC~~ SOLN
40.0000 mg | SUBCUTANEOUS | Status: DC
  Administered 2018-12-02 – 2018-12-03 (×2): 40 mg via SUBCUTANEOUS
  Filled 2018-12-01 (×2): qty 0.4

## 2018-12-01 MED ORDER — GLYCOPYRROLATE PF 0.2 MG/ML IJ SOSY
PREFILLED_SYRINGE | INTRAMUSCULAR | Status: AC
  Filled 2018-12-01: qty 2

## 2018-12-01 MED ORDER — POVIDONE-IODINE 10 % OINT PACKET
TOPICAL_OINTMENT | CUTANEOUS | Status: DC | PRN
  Administered 2018-12-01 (×2): 1 via TOPICAL

## 2018-12-01 MED ORDER — MONTELUKAST SODIUM 10 MG PO TABS
10.0000 mg | ORAL_TABLET | Freq: Every day | ORAL | Status: DC
  Administered 2018-12-01 – 2018-12-02 (×2): 10 mg via ORAL
  Filled 2018-12-01 (×2): qty 1

## 2018-12-01 SURGICAL SUPPLY — 46 items
APPLIER CLIP 11 MED OPEN (CLIP) ×8
BINDER BREAST LRG (GAUZE/BANDAGES/DRESSINGS) ×4 IMPLANT
CHLORAPREP W/TINT 26ML (MISCELLANEOUS) ×4 IMPLANT
CLIP APPLIE 11 MED OPEN (CLIP) ×4 IMPLANT
CLOTH BEACON ORANGE TIMEOUT ST (SAFETY) ×4 IMPLANT
COVER LIGHT HANDLE STERIS (MISCELLANEOUS) ×8 IMPLANT
DRAPE PROXIMA HALF (DRAPES) ×4 IMPLANT
DRAPE UTILITY W/TAPE 26X15 (DRAPES) ×4 IMPLANT
ELECT REM PT RETURN 9FT ADLT (ELECTROSURGICAL) ×4
ELECTRODE REM PT RTRN 9FT ADLT (ELECTROSURGICAL) ×2 IMPLANT
EVACUATOR DRAINAGE 10X20 100CC (DRAIN) ×4 IMPLANT
EVACUATOR SILICONE 100CC (DRAIN) ×4
GAUZE SPONGE 4X4 12PLY STRL (GAUZE/BANDAGES/DRESSINGS) ×4 IMPLANT
GLOVE BIOGEL M 6.5 STRL (GLOVE) ×4 IMPLANT
GLOVE BIOGEL PI IND STRL 6.5 (GLOVE) ×2 IMPLANT
GLOVE BIOGEL PI IND STRL 7.0 (GLOVE) ×6 IMPLANT
GLOVE BIOGEL PI INDICATOR 6.5 (GLOVE) ×2
GLOVE BIOGEL PI INDICATOR 7.0 (GLOVE) ×6
GLOVE SURG SS PI 7.5 STRL IVOR (GLOVE) ×8 IMPLANT
GOWN STRL REUS W/ TWL XL LVL3 (GOWN DISPOSABLE) ×2 IMPLANT
GOWN STRL REUS W/TWL LRG LVL3 (GOWN DISPOSABLE) ×12 IMPLANT
GOWN STRL REUS W/TWL XL LVL3 (GOWN DISPOSABLE) ×2
INST SET MINOR GENERAL (KITS) ×4 IMPLANT
KIT TURNOVER KIT A (KITS) ×4 IMPLANT
MANIFOLD NEPTUNE II (INSTRUMENTS) ×4 IMPLANT
NEEDLE HYPO 21X1.5 SAFETY (NEEDLE) ×4 IMPLANT
NS IRRIG 1000ML POUR BTL (IV SOLUTION) ×8 IMPLANT
PACK MINOR (CUSTOM PROCEDURE TRAY) ×4 IMPLANT
PAD ABD 5X9 TENDERSORB (GAUZE/BANDAGES/DRESSINGS) ×12 IMPLANT
PAD ARMBOARD 7.5X6 YLW CONV (MISCELLANEOUS) ×4 IMPLANT
SET BASIN LINEN APH (SET/KITS/TRAYS/PACK) ×4 IMPLANT
SPONGE DRAIN TRACH 4X4 STRL 2S (GAUZE/BANDAGES/DRESSINGS) ×8 IMPLANT
SPONGE INTESTINAL PEANUT (DISPOSABLE) ×4 IMPLANT
SPONGE LAP 18X18 RF (DISPOSABLE) ×12 IMPLANT
STAPLER VISISTAT (STAPLE) ×12 IMPLANT
SUT ETHILON 3 0 FSL (SUTURE) ×8 IMPLANT
SUT MNCRL AB 4-0 PS2 18 (SUTURE) ×8 IMPLANT
SUT SILK 2 0 (SUTURE) ×2
SUT SILK 2 0 SH (SUTURE) ×8 IMPLANT
SUT SILK 2-0 18XBRD TIE 12 (SUTURE) ×2 IMPLANT
SUT VIC AB 2-0 CT1 27 (SUTURE) ×20
SUT VIC AB 2-0 CT1 TAPERPNT 27 (SUTURE) ×20 IMPLANT
SUT VIC AB 3-0 SH 27 (SUTURE) ×2
SUT VIC AB 3-0 SH 27X BRD (SUTURE) ×2 IMPLANT
SYR 20CC LL (SYRINGE) ×4 IMPLANT
SYR BULB IRRIGATION 50ML (SYRINGE) ×4 IMPLANT

## 2018-12-01 NOTE — Anesthesia Procedure Notes (Signed)
Procedure Name: LMA Insertion Date/Time: 12/01/2018 7:37 AM Performed by: Ollen Bowl, CRNA Pre-anesthesia Checklist: Patient identified, Patient being monitored, Emergency Drugs available, Timeout performed and Suction available Patient Re-evaluated:Patient Re-evaluated prior to induction Oxygen Delivery Method: Circle System Utilized Preoxygenation: Pre-oxygenation with 100% oxygen Induction Type: IV induction Ventilation: Mask ventilation without difficulty LMA: LMA inserted LMA Size: 3.0 Number of attempts: 1 Placement Confirmation: positive ETCO2 and breath sounds checked- equal and bilateral

## 2018-12-01 NOTE — Anesthesia Preprocedure Evaluation (Signed)
Anesthesia Evaluation    Airway Mallampati: III   Neck ROM: full    Dental  (+) Missing, Poor Dentition   Pulmonary asthma ,           Cardiovascular hypertension, On Medications      Neuro/Psych    GI/Hepatic   Endo/Other  diabetes, Type 2  Renal/GU Renal disease     Musculoskeletal   Abdominal   Peds  Hematology   Anesthesia Other Findings Diabetic meds discountinued by PMD 6 months ago- stable  Blood Sugars. No inhaler use for astham in at least 3 months Opens wide to large, wide tongue with Class II view  Reproductive/Obstetrics                             Anesthesia Physical Anesthesia Plan  ASA: III  Anesthesia Plan: General   Post-op Pain Management:    Induction:   PONV Risk Score and Plan:   Airway Management Planned:   Additional Equipment:   Intra-op Plan:   Post-operative Plan:   Informed Consent:   Dental Advisory Given  Plan Discussed with: Anesthesiologist  Anesthesia Plan Comments:         Anesthesia Quick Evaluation

## 2018-12-01 NOTE — Interval H&P Note (Signed)
History and Physical Interval Note:  12/01/2018 7:14 AM  Whitney Velez  has presented today for surgery, with the diagnosis of bilateral breast cancer  The various methods of treatment have been discussed with the patient and family. After consideration of risks, benefits and other options for treatment, the patient has consented to  Procedure(s): MASTECTOMY MODIFIED RADICAL (Right) SIMPLE MASTECTOMY (Left) as a surgical intervention .  The patient's history has been reviewed, patient examined, no change in status, stable for surgery.  I have reviewed the patient's chart and labs.  Questions were answered to the patient's satisfaction.     Aviva Signs

## 2018-12-01 NOTE — Plan of Care (Signed)
  Problem: Acute Rehab PT Goals(only PT should resolve) Goal: Pt Will Go Supine/Side To Sit Outcome: Progressing Flowsheets (Taken 12/01/2018 1611) Pt will go Supine/Side to Sit: with modified independence Goal: Patient Will Transfer Sit To/From Stand Outcome: Progressing Flowsheets (Taken 12/01/2018 1611) Patient will transfer sit to/from stand: with supervision Goal: Pt Will Transfer Bed To Chair/Chair To Bed Outcome: Progressing Flowsheets (Taken 12/01/2018 1611) Pt will Transfer Bed to Chair/Chair to Bed: with supervision Goal: Pt Will Ambulate Outcome: Progressing Flowsheets (Taken 12/01/2018 1611) Pt will Ambulate: 75 feet; with min guard assist; with cane   4:12 PM, 12/01/18 Lonell Grandchild, MPT Physical Therapist with [facility] 336 [phone] office [phone] mobile phone

## 2018-12-01 NOTE — Evaluation (Signed)
Physical Therapy Evaluation Patient Details Name: Whitney Velez MRN: [id number] DOB: [birth year] Today's Date: 12/01/2018   History of Present Illness  Whitney Velez is a 68 y/o female, s/p Right modified radical mastectomy, left simple mastectomy 12/01/18 , with the diagnosis of bilateral breast cancer      Clinical Impression  Patient instructed in post-op mastectomy exercises with good return demonstrated and understanding acknowledged.  Patient limited to a few side steps at bedside mostly due to c/o severe dizziness when standing and put back to bed after therapy - RN notified.  Patient will benefit from continued physical therapy in hospital and recommended venue below to increase strength, balance, endurance for safe ADLs and gait.    Follow Up Recommendations Home health PT;Supervision/Assistance - 24 hour;Supervision for mobility/OOB    Equipment Recommendations  Rolling walker with 5" wheels    Recommendations for Other Services       Precautions / Restrictions Precautions Precautions: Fall Restrictions Weight Bearing Restrictions: No      Mobility  Bed Mobility Overal bed mobility: Needs Assistance Bed Mobility: Supine to Sit;Sit to Supine     Supine to sit: Min guard;Min assist Sit to supine: Min guard;Min assist   General bed mobility comments: required assistance to move BLE  Transfers Overall transfer level: Needs assistance Equipment used: 1 person hand held assist;None Transfers: Sit to/from Stand           General transfer comment: slow labored movement , unsteady on feet, c/o dizziness  Ambulation/Gait Ambulation/Gait assistance: Min assist;Mod assist Gait Distance (Feet): 3 Feet Assistive device: 1 person hand held assist;None Gait Pattern/deviations: Decreased step length - right;Decreased step length - left;Decreased stride length Gait velocity: slow   General Gait Details: limited to 4-5 insteady small side steps at bedside mostly due  to c/o persistent dizziness, once sitting, worse when standing  Stairs            Wheelchair Mobility    Modified Rankin (Stroke Patients Only)       Balance Overall balance assessment: Needs assistance Sitting-balance support: Feet supported;No upper extremity supported Sitting balance-Leahy Scale: Good     Standing balance support: No upper extremity supported;During functional activity Standing balance-Leahy Scale: Poor Standing balance comment: hand held assist                             Pertinent Vitals/Pain Pain Assessment: No/denies pain    Home Living Family/patient expects to be discharged to:: Private residence Living Arrangements: Alone Available Help at Discharge: Family   Home Access: Level entry     Home Layout: One level Home Equipment: Whitney Velez - single point      Prior Function Level of Independence: Independent         Comments: community ambulator, drives occasionally     Journalist, newspaper        Extremity/Trunk Assessment   Upper Extremity Assessment Upper Extremity Assessment: Generalized weakness    Lower Extremity Assessment Lower Extremity Assessment: Generalized weakness    Cervical / Trunk Assessment Cervical / Trunk Assessment: Normal  Communication   Communication: No difficulties  Cognition Arousal/Alertness: Awake/alert Behavior During Therapy: WFL for tasks assessed/performed Overall Cognitive Status: Within Functional Limits for tasks assessed  General Comments      Exercises Other Exercises Other Exercises: Patient instructed in post op mastectomy exercises   Assessment/Plan    PT Assessment Patient needs continued PT services  PT Problem List         PT Treatment Interventions Gait training;Stair training;Functional mobility training;Therapeutic activities;Therapeutic exercise;Patient/family education    PT Goals (Current goals can be  found in the Care Plan section)  Acute Rehab PT Goals Patient Stated Goal: return home with family to assist PT Goal Formulation: With patient/family Time For Goal Achievement: 12/06/18 Potential to Achieve Goals: Good    Frequency Min 5X/week   Barriers to discharge        Co-evaluation               AM-PAC PT "6 Clicks" Mobility  Outcome Measure Help needed turning from your back to your side while in a flat bed without using bedrails?: None Help needed moving from lying on your back to sitting on the side of a flat bed without using bedrails?: Total Help needed moving to and from a bed to a chair (including a wheelchair)?: Total Help needed standing up from a chair using your arms (e.g., wheelchair or bedside chair)?: A Lot Help needed to walk in hospital room?: A Lot Help needed climbing 3-5 steps with a railing? : A Lot 6 Click Score: 12    End of Session Equipment Utilized During Treatment: Gait belt Activity Tolerance: Patient limited by fatigue;Patient tolerated treatment well(Patient limited by dizziness) Patient left: in bed;with call bell/phone within reach;with family/visitor present Nurse Communication: Mobility status PT Visit Diagnosis: Unsteadiness on feet (R26.81);Other abnormalities of gait and mobility (R26.89);Muscle weakness (generalized) (M62.81)    Time: 0867-6195 PT Time Calculation (min) (ACUTE ONLY): 28 min   Charges:   PT Evaluation $PT Eval Moderate Complexity: 1 Mod PT Treatments $Therapeutic Activity: 23-37 mins        4:10 PM, 12/01/18 Whitney Velez, MPT Physical Therapist with [facility] [phone] office [phone] mobile phone

## 2018-12-01 NOTE — Transfer of Care (Signed)
Immediate Anesthesia Transfer of Care Note  Patient: Whitney Velez  Procedure(s) Performed: RIGHT MODIFIED RADICAL MASTECTOMY (Procedure #1) (Right Breast) LEFT SIMPLE MASTECTOMY (Procedure #2) (Left Breast)  Patient Location: PACU  Anesthesia Type:General  Level of Consciousness: awake and confused  Airway & Oxygen Therapy: Patient Spontanous Breathing  Post-op Assessment: Report given to RN  Post vital signs: Reviewed and stable  Last Vitals:  Vitals Value Taken Time  BP 126/95 12/01/2018 10:00 AM  Temp    Pulse 117 12/01/2018 10:02 AM  Resp 18 12/01/2018 10:02 AM  SpO2 95 % 12/01/2018 10:02 AM  Vitals shown include unvalidated device data.  Last Pain:  Vitals:   12/01/18 0647  TempSrc: Oral  PainSc: 0-No pain      Patients Stated Pain Goal: 6 (61/22/44 9753)  Complications: No apparent anesthesia complications

## 2018-12-01 NOTE — Op Note (Signed)
Patient:  Whitney Velez  DOB:  01-19-1950  MRN:  [id number]   Preop Diagnosis: Invasive ductal carcinoma of right breast, ductal carcinoma in situ of left breast  Postop Diagnosis: Same  Procedure: Right modified radical mastectomy, left simple mastectomy  Surgeon: Aviva Signs, MD  Assistant: Blake Divine, MD  Anes: General endotracheal  Indications: Patient is a 68 year old black female with a known history of invasive ductal carcinoma of the right breast with extension into the axilla as well as a ductal carcinoma in situ of the left breast.  She has undergone neoadjuvant chemotherapy and now presents for a right modified radical mastectomy and left simple mastectomy.  The risks and benefits of the procedures including bleeding, infection, cardiopulmonary difficulties, arm swelling, and the possibility of nerve injury were fully explained to the patient, who gave informed consent.  Procedure note: The patient was placed in the supine position.  After induction of general endotracheal anesthesia, the breasts and axillas were prepped and draped using usual sterile technique with DuraPrep.  Surgical site confirmation was performed.  An elliptical incision was made around the right nipple.  A superior flap was then performed to the clavicle and an inferior flap form to the chest wall.  The breasts was then removed from the pectoralis major muscle medial to lateral using Bovie electrocautery.  A suture was placed superiorly for orientation purposes.  The right breast was then removed from the operative field.  In the right axilla, multiple matted nodes were present.  These were removed and care was taken to avoid the major neurovascular structures.  Any bleeding was controlled using clips.  I could not palpate any other lymphadenopathy in the right axilla.  The wound was irrigated with normal saline.  A #10 flat Jackson-Pratt drain was placed into the right axilla and right flap and brought  through a separate stab wound inferior to the incision line.  It was secured at the skin level using a 3-0 nylon interrupted suture.  The wound was injected with Exparel.  The subcutaneous layer was reapproximated using a 2-0 Vicryl interrupted suture.  The skin was then closed using staples.  Next, we proceeded with the left simple mastectomy.  An elliptical incision was made around the nipple.  A superior flap was formed towards the clavicle and an inferior flap form to the chest wall.  The left breast was then removed from the pectoralis major muscle and chest wall using Bovie electrocautery.  A suture was placed superiorly for orientation purposes.  The left breast was then removed from the operative field.  The wound was irrigated with normal saline.  A bleeding was controlled using Bovie electrocautery.  The subcutaneous layer was reapproximated using 2-0 Vicryl interrupted suture.  The skin was closed using staples.  Betadine ointment and dry sterile dressings were applied to both wounds.  All tape and needle counts were correct at the end of the procedure.  The patient was extubated in the operating room and transferred to PACU in stable condition.  Complications: None  EBL: 250 cc  Specimen: Right breast, right axillary contents, left breast  Drains: Bilateral Jackson-Pratt drains to flaps

## 2018-12-01 NOTE — Anesthesia Postprocedure Evaluation (Signed)
Anesthesia Post Note  Patient: RHYEN MAZARIEGO  Procedure(s) Performed: RIGHT MODIFIED RADICAL MASTECTOMY (Procedure #1) (Right Breast) LEFT SIMPLE MASTECTOMY (Procedure #2) (Left Breast)  Patient location during evaluation: PACU Anesthesia Type: General Level of consciousness: awake and alert and oriented Pain management: pain level controlled Vital Signs Assessment: post-procedure vital signs reviewed and stable Respiratory status: spontaneous breathing Cardiovascular status: blood pressure returned to baseline and stable Postop Assessment: no apparent nausea or vomiting Anesthetic complications: no     Last Vitals:  Vitals:   12/01/18 1000 12/01/18 1021  BP: (!) 126/95   Pulse: (!) 110 96  Resp: 14   Temp: (!) 36.4 C   SpO2: 99%     Last Pain:  Vitals:   12/01/18 1021  TempSrc:   PainSc: 0-No pain                 Marylee Belzer

## 2018-12-02 ENCOUNTER — Encounter (HOSPITAL_COMMUNITY): Payer: Self-pay | Admitting: General Surgery

## 2018-12-02 LAB — CBC
HCT: 21.2 % — ABNORMAL LOW (ref 36.0–46.0)
Hemoglobin: 6.7 g/dL — CL (ref 12.0–15.0)
MCH: 32.1 pg (ref 26.0–34.0)
MCHC: 31.6 g/dL (ref 30.0–36.0)
MCV: 101.4 fL — ABNORMAL HIGH (ref 80.0–100.0)
Platelets: 172 10*3/uL (ref 150–400)
RBC: 2.09 MIL/uL — ABNORMAL LOW (ref 3.87–5.11)
RDW: 13.6 % (ref 11.5–15.5)
WBC: 7.4 10*3/uL (ref 4.0–10.5)
nRBC: 0 % (ref 0.0–0.2)

## 2018-12-02 LAB — BASIC METABOLIC PANEL
Anion gap: 7 (ref 5–15)
BUN: 17 mg/dL (ref 8–23)
CO2: 21 mmol/L — ABNORMAL LOW (ref 22–32)
Calcium: 8.1 mg/dL — ABNORMAL LOW (ref 8.9–10.3)
Chloride: 107 mmol/L (ref 98–111)
Creatinine, Ser: 1.21 mg/dL — ABNORMAL HIGH (ref 0.44–1.00)
GFR calc Af Amer: 52 mL/min — ABNORMAL LOW (ref >60)
GFR, EST NON AFRICAN AMERICAN: 45 mL/min — AB (ref >60)
Glucose, Bld: 86 mg/dL (ref 70–99)
Potassium: 4.1 mmol/L (ref 3.5–5.1)
Sodium: 135 mmol/L (ref 135–145)

## 2018-12-02 LAB — PREPARE RBC (CROSSMATCH)

## 2018-12-02 LAB — HEMOGLOBIN AND HEMATOCRIT, BLOOD
HCT: 30.9 % — ABNORMAL LOW (ref 36.0–46.0)
Hemoglobin: 10 g/dL — ABNORMAL LOW (ref 12.0–15.0)

## 2018-12-02 MED ORDER — SODIUM CHLORIDE 0.9% IV SOLUTION
Freq: Once | INTRAVENOUS | Status: AC
  Administered 2018-12-02: 11:00:00 via INTRAVENOUS

## 2018-12-02 NOTE — Progress Notes (Signed)
CRITICAL VALUE ALERT  Critical Value:  Hgb 6.7  Date & Time Notified:  12/02/18 0603 Provider Notified: RN sent text page with reslut values to Dr. Constance Haw

## 2018-12-02 NOTE — Care Management Note (Signed)
Case Management Note  Patient Details  Name: Whitney Velez MRN: [id number] Date of Birth: [birth year]  Subjective/Objective:  S/p bilateral mastectomy. Pt from home with strong family support. PT recommends HH PT, pt declines, saying she feels much better than she did yesterday. She has ambulated in the hall this AM. She has a cane to use pta but would like a walker.                   Action/Plan: Pt given list of DME providers and has chosen [facility]. Vaughan Basta, [facility] rep, given referral and will deliver DME to pt room prior to DC.   Expected Discharge Date:  12/03/18               Expected Discharge Plan:  Home/Self Care  In-House Referral:  NA  Discharge planning Services  CM Consult  Post Acute Care Choice:  Durable Medical Equipment Choice offered to:  Patient  DME Arranged:  Gilford Rile rolling DME Agency:  [place]:  Patient Refused [place] Agency:     Status of Service:  Completed, signed off  If discussed at [place] of Stay Meetings, dates discussed:    Additional Comments:  Sherald Barge, RN 12/02/2018, 2:27 PM

## 2018-12-02 NOTE — Progress Notes (Signed)
1 Day Post-Op  Subjective: Patient has moderate incisional pain.  Otherwise no other complaints.  Objective: Vital signs in last 24 hours: Temp:  [97.5 F (36.4 C)-98.2 F (36.8 C)] 98.2 F (36.8 C) (12/02 2244) Pulse Rate:  [69-110] 69 (12/03 0708) Resp:  [6-18] 18 (12/03 0708) BP: (115-127)/(69-95) 115/72 (12/03 0708) SpO2:  [98 %-100 %] 100 % (12/03 0708) Last BM Date: 11/30/18  Intake/Output from previous day: 12/02 0701 - 12/03 0700 In: 2886.1 [P.O.:240; I.V.:2246.1; IV Piggyback:300] Out: 480 [Drains:230; Blood:250] Intake/Output this shift: No intake/output data recorded.  General appearance: alert, cooperative and no distress Resp: clear to auscultation bilaterally Breasts: Bilateral mastectomy incisions healing well with ecchymosis present, but no hematoma.  JP drainage sanguinous. Cardio: regular rate and rhythm, S1, S2 normal, no murmur, click, rub or gallop  Lab Results:  Recent Labs    12/02/18 0440  WBC 7.4  HGB 6.7*  HCT 21.2*  PLT 172   BMET Recent Labs    12/02/18 0440  NA 135  K 4.1  CL 107  CO2 21*  GLUCOSE 86  BUN 17  CREATININE 1.21*  CALCIUM 8.1*   PT/INR No results for input(s): LABPROT, INR in the last 72 hours.  Studies/Results: No results found.  Anti-infectives: Anti-infectives (From admission, onward)   Start     Dose/Rate Route Frequency Ordered Stop   12/01/18 0700  vancomycin (VANCOCIN) IVPB 1000 mg/200 mL premix     1,000 mg 200 mL/hr over 60 Minutes Intravenous On call to O.R. 12/01/18 0650 12/01/18 0723      Assessment/Plan: s/p Procedure(s): RIGHT MODIFIED RADICAL MASTECTOMY (Procedure #1) LEFT SIMPLE MASTECTOMY (Procedure #2) Impression: Postoperative anemia secondary to acute surgical blood loss.  Patient states that she did receive blood transfusions during her new adjuvant chemotherapy.  Her blood pressure is low normal.  We will transfuse 2 units of packed red blood cells.  LOS: 1 day    Aviva Signs 12/02/2018

## 2018-12-02 NOTE — Progress Notes (Signed)
Physical Therapy Treatment Patient Details Name: Whitney Velez MRN: [id number] DOB: [birth year] Today's Date: 12/02/2018    History of Present Illness Whitney Velez is a 68 y/o female, s/p Right modified radical mastectomy, left simple mastectomy 12/01/18 , with the diagnosis of bilateral breast cancer      PT Comments    Patient performed much better today without c/o dizziness throughout session. Ambulated 40 feet with RW and min guard. Minimal assistance for bedsheets for supine to sit.  Patient would continue to benefit from skilled physical therapy in current environment and next venue to continue return to prior function and increase strength, endurance, balance, coordination, and functional mobility and gait skills.     Follow Up Recommendations  Home health PT;Supervision/Assistance - 24 hour;Supervision for mobility/OOB     Equipment Recommendations  Rolling walker with 5" wheels    Recommendations for Other Services       Precautions / Restrictions Precautions Precautions: Fall Restrictions Weight Bearing Restrictions: No    Mobility  Bed Mobility Overal bed mobility: Needs Assistance Bed Mobility: Supine to Sit;Sit to Supine     Supine to sit: Min guard;Min assist     General bed mobility comments: required assistance to move bedsheets  Transfers Overall transfer level: Needs assistance Equipment used: None;Rolling walker (2 wheeled) Transfers: Sit to/from [facility] Sit to Stand: Min guard Stand pivot transfers: Min guard       General transfer comment: slow labored movement   Ambulation/Gait Ambulation/Gait assistance: Min guard Gait Distance (Feet): 40 Feet Assistive device: Rolling walker (2 wheeled) Gait Pattern/deviations: Decreased step length - right;Decreased step length - left;Decreased stride length Gait velocity: decreased   General Gait Details: much better today; slow labored movement, small steps; no dizziness  today   Stairs             Wheelchair Mobility    Modified Rankin (Stroke Patients Only)       Balance Overall balance assessment: Needs assistance Sitting-balance support: Feet supported;No upper extremity supported Sitting balance-Leahy Scale: Good     Standing balance support: During functional activity;Bilateral upper extremity supported Standing balance-Leahy Scale: Fair Standing balance comment: fair/good with RW                            Cognition Arousal/Alertness: Awake/alert Behavior During Therapy: WFL for tasks assessed/performed Overall Cognitive Status: Within Functional Limits for tasks assessed                                        Exercises Other Exercises Other Exercises: Patient instructed in post op mastectomy exercises    General Comments        Pertinent Vitals/Pain Pain Assessment: No/denies pain    Home Living                      Prior Function            PT Goals (current goals can now be found in the care plan section) Acute Rehab PT Goals Patient Stated Goal: return home with family to assist PT Goal Formulation: With patient/family Time For Goal Achievement: 12/06/18 Potential to Achieve Goals: Good Progress towards PT goals: Progressing toward goals    Frequency    Min 5X/week      PT Plan Current plan remains appropriate  Co-evaluation              AM-PAC PT "6 Clicks" Mobility   Outcome Measure  Help needed turning from your back to your side while in a flat bed without using bedrails?: None Help needed moving from lying on your back to sitting on the side of a flat bed without using bedrails?: A Little Help needed moving to and from a bed to a chair (including a wheelchair)?: A Little Help needed standing up from a chair using your arms (e.g., wheelchair or bedside chair)?: A Little Help needed to walk in hospital room?: A Little Help needed climbing 3-5  steps with a railing? : A Lot 6 Click Score: 18    End of Session   Activity Tolerance: Patient limited by fatigue;Patient tolerated treatment well(Patient limited by dizziness) Patient left: with call bell/phone within reach;with family/visitor present;in chair Nurse Communication: Mobility status PT Visit Diagnosis: Unsteadiness on feet (R26.81);Other abnormalities of gait and mobility (R26.89);Muscle weakness (generalized) (M62.81)     Time: 0539-7673 PT Time Calculation (min) (ACUTE ONLY): 25 min  Charges:  $Gait Training: 8-22 mins $Therapeutic Activity: 8-22 mins                     Whitney Raveling. Hartnett-Rands, MS, PT Per [place] [phone] 12/02/2018, 11:23 AM

## 2018-12-03 LAB — TYPE AND SCREEN
ABO/RH(D): A POS
ANTIBODY SCREEN: NEGATIVE
Unit division: 0
Unit division: 0

## 2018-12-03 LAB — BASIC METABOLIC PANEL
Anion gap: 5 (ref 5–15)
BUN: 11 mg/dL (ref 8–23)
CO2: 22 mmol/L (ref 22–32)
Calcium: 8.3 mg/dL — ABNORMAL LOW (ref 8.9–10.3)
Chloride: 110 mmol/L (ref 98–111)
Creatinine, Ser: 0.89 mg/dL (ref 0.44–1.00)
GFR calc Af Amer: >60 mL/min (ref >60)
Glucose, Bld: 89 mg/dL (ref 70–99)
Potassium: 3.7 mmol/L (ref 3.5–5.1)
Sodium: 137 mmol/L (ref 135–145)

## 2018-12-03 LAB — CBC
HCT: 25.7 % — ABNORMAL LOW (ref 36.0–46.0)
Hemoglobin: 8.3 g/dL — ABNORMAL LOW (ref 12.0–15.0)
MCH: 30.9 pg (ref 26.0–34.0)
MCHC: 32.3 g/dL (ref 30.0–36.0)
MCV: 95.5 fL (ref 80.0–100.0)
Platelets: 142 10*3/uL — ABNORMAL LOW (ref 150–400)
RBC: 2.69 MIL/uL — ABNORMAL LOW (ref 3.87–5.11)
RDW: 15.2 % (ref 11.5–15.5)
WBC: 7.7 10*3/uL (ref 4.0–10.5)
nRBC: 0 % (ref 0.0–0.2)

## 2018-12-03 LAB — BPAM RBC
Blood Product Expiration Date: 201912272359
Blood Product Expiration Date: 201912272359
ISSUE DATE / TIME: 201912031106
ISSUE DATE / TIME: 201912031447
UNIT TYPE AND RH: 6200
Unit Type and Rh: 6200

## 2018-12-03 MED ORDER — TRAMADOL HCL 50 MG PO TABS: 50.0000 mg | ORAL_TABLET | Freq: Four times a day (QID) | ORAL | 0 refills | Status: DC | PRN

## 2018-12-03 NOTE — Discharge Instructions (Signed)
Surgical Drain Home Care °Surgical drains are used to remove extra fluid that normally builds up in a surgical wound after surgery. A surgical drain helps to heal a surgical wound. Different kinds of surgical drains include: °· Active drains. These drains use suction to pull drainage away from the surgical wound. Drainage flows through a tube to a container outside of the body. It is important to keep the bulb or the drainage container flat (compressed) at all times, except while you empty it. Flattening the bulb or container creates suction. The two most common types of active drains are bulb drains and Hemovac drains. °· Passive drains. These drains allow fluid to drain naturally, by gravity. Drainage flows through a tube to a bandage (dressing) or a container outside of the body. Passive drains do not need to be emptied. The most common type of passive drain is the Penrose drain. ° °A drain is placed during surgery. Immediately after surgery, drainage is usually bright red and a little thicker than water. The drainage may gradually turn yellow or pink and become thinner. It is likely that your health care provider will remove the drain when the drainage stops or when the amount decreases to 1-2 Tbsp (15-30 mL) during a 24-hour period. °How to care for your surgical drain °· Keep the skin around the drain dry and covered with a dressing at all times. °· Check your drain area every day for signs of infection. Check for: °? More redness, swelling, or pain. °? Pus or a bad smell. °? Cloudy drainage. °Follow instructions from your health care provider about how to take care of your drain and how to change your dressing. Change your dressing at least one time every day. Change it more often if needed to keep the dressing dry. Make sure you: °1. Gather your supplies, including: °? Tape. °? Germ-free cleaning solution (sterile saline). °? Split gauze drain sponge: 4 x 4 inches (10 x 10 cm). °? Gauze square: 4 x 4 inches  (10 x 10 cm). °2. Wash your hands with soap and water before you change your dressing. If soap and water are not available, use hand sanitizer. °3. Remove the old dressing. Avoid using scissors to do that. °4. Use sterile saline to clean your skin around the drain. °5. Place the tube through the slit in a drain sponge. Place the drain sponge so that it covers your wound. °6. Place the gauze square or another drain sponge on top of the drain sponge that is on the wound. Make sure the tube is between those layers. °7. Tape the dressing to your skin. °8. If you have an active bulb or Hemovac drain, tape the drainage tube to your skin 1-2 inches (2.5-5 cm) below the place where the tube enters your body. Taping keeps the tube from pulling on any stitches (sutures) that you have. °9. Wash your hands with soap and water. °10. Write down the color of your drainage and how often you change your dressing. ° °How to empty your active bulb or Hemovac drain °1. Make sure that you have a measuring cup that you can empty your drainage into. °2. Wash your hands with soap and water. If soap and water are not available, use hand sanitizer. °3. Gently move your fingers down the tube while squeezing very lightly. This is called stripping the tube. This clears any drainage, clots, or tissue from the tube. °? Do not pull on the tube. °? You may need to strip   the tube several times every day to keep the tube clear. °4. Open the bulb cap or the drain plug. Do not touch the inside of the cap or the bottom of the plug. °5. Empty all of the drainage into the measuring cup. °6. Compress the bulb or the container and replace the cap or the plug. To compress the bulb or the container, squeeze it firmly in the middle while you close the cap or plug the container. °7. Write down the amount of drainage that you have in each 24-hour period. If you have less than 2 Tbsp (30 mL) of drainage during 24 hours, contact your health care  provider. °8. Flush the drainage down the toilet. °9. Wash your hands with soap and water. °Contact a health care provider if: °· You have more redness, swelling, or pain around your drain area. °· The amount of drainage that you have is increasing instead of decreasing. °· You have pus or a bad smell coming from your drain area. °· You have a fever. °· You have drainage that is cloudy. °· There is a sudden stop or a sudden decrease in the amount of drainage that you have. °· Your tube falls out. °· Your active drain does not stay compressed after you empty it. °This information is not intended to replace advice given to you by your health care provider. Make sure you discuss any questions you have with your health care provider. °Document Released: 12/14/2000 Document Revised: 05/24/2016 Document Reviewed: 07/06/2015 °[employer]vier Interactive Patient Education © 2018 [employer] ° °

## 2018-12-03 NOTE — Progress Notes (Signed)
Physical Therapy Treatment Patient Details Name: Whitney Velez MRN: [id number] DOB: Aug 25, 1950 Today's Date: 12/03/2018    History of Present Illness Whitney Velez is a 68 y/o female, s/p Right modified radical mastectomy, left simple mastectomy 12/01/18 , with the diagnosis of bilateral breast cancer      PT Comments    Pt received in bed and was agreeable to PT treatment. Pt's overall mobility continues to be slow and labored requiring min guard to min A. Assisted pt to bathroom and amb 53ft with RW. Very slow cadence and movement generally labored but no evidence of LOB. Pt wished to return to bed at EOS; son at bedside. Continue to recommend HHPT upon d/c once ready for d/c in order to further maximize strength, gait, balance, and return to PLOF.     Follow Up Recommendations  Home health PT;Supervision/Assistance - 24 hour;Supervision for mobility/OOB     Equipment Recommendations  Rolling walker with 5" wheels    Recommendations for Other Services       Precautions / Restrictions Precautions Precautions: Fall Restrictions Weight Bearing Restrictions: No    Mobility  Bed Mobility Overal bed mobility: Needs Assistance Bed Mobility: Supine to Sit;Sit to Supine     Supine to sit: Min guard Sit to supine: Min guard;Min assist   General bed mobility comments: required assistance to move bedsheets; min A to reposition in bed sit > supine  Transfers Overall transfer level: Needs assistance Equipment used: None;Rolling walker (2 wheeled) Transfers: Sit to/from [facility] Sit to Stand: Min guard Stand pivot transfers: Min guard       General transfer comment: slow labored movement; cues for hand placement  Ambulation/Gait Ambulation/Gait assistance: Min guard Gait Distance (Feet): 90 Feet Assistive device: Rolling walker (2 wheeled) Gait Pattern/deviations: Decreased step length - right;Decreased step length - left;Decreased stride length Gait  velocity: decreased   General Gait Details: continued slow labored movement, small steps; no dizziness today during gait   Stairs             Wheelchair Mobility    Modified Rankin (Stroke Patients Only)       Balance Overall balance assessment: Needs assistance Sitting-balance support: Feet supported;No upper extremity supported Sitting balance-Leahy Scale: Good     Standing balance support: During functional activity;Bilateral upper extremity supported Standing balance-Leahy Scale: Fair Standing balance comment: fair/good with RW                            Cognition Arousal/Alertness: Awake/alert Behavior During Therapy: WFL for tasks assessed/performed Overall Cognitive Status: Within Functional Limits for tasks assessed                                        Exercises      General Comments        Pertinent Vitals/Pain      Home Living                      Prior Function            PT Goals (current goals can now be found in the care plan section) Acute Rehab PT Goals Patient Stated Goal: return home with family to assist PT Goal Formulation: With patient/family Time For Goal Achievement: 12/06/18 Potential to Achieve Goals: Good    Frequency  Min 5X/week      PT Plan      Co-evaluation              AM-PAC PT "6 Clicks" Mobility   Outcome Measure  Help needed turning from your back to your side while in a flat bed without using bedrails?: None Help needed moving from lying on your back to sitting on the side of a flat bed without using bedrails?: A Little Help needed moving to and from a bed to a chair (including a wheelchair)?: A Little Help needed standing up from a chair using your arms (e.g., wheelchair or bedside chair)?: A Little Help needed to walk in hospital room?: A Little Help needed climbing 3-5 steps with a railing? : A Lot 6 Click Score: 18    End of Session Equipment  Utilized During Treatment: Gait belt Activity Tolerance: Patient limited by fatigue;Patient tolerated treatment well Patient left: with call bell/phone within reach;with family/visitor present;in chair Nurse Communication: Mobility status PT Visit Diagnosis: Unsteadiness on feet (R26.81);Other abnormalities of gait and mobility (R26.89);Muscle weakness (generalized) (M62.81)     Time: 1478-2956 PT Time Calculation (min) (ACUTE ONLY): 15 min  Charges:  $Therapeutic Activity: 8-22 mins                        Geraldine Solar PT, DPT

## 2018-12-03 NOTE — Progress Notes (Signed)
Patient discharged home today per MD orders. Patient vital signs WDL. IV removed and site WDL. Discharge Instructions including follow up appointments, medications, and education reviewed with patient. Patient verbalizes understanding. Patient is transported out via wheelchair.  

## 2018-12-03 NOTE — Discharge Summary (Signed)
Physician Discharge Summary  Patient ID: Whitney Velez MRN: [id number] DOB/AGE: 70-Sep-1951 70 y.o.  Admit date: 12/01/2018 Discharge date: 12/03/2018  Admission Diagnoses: Bilateral breast carcinoma  Discharge Diagnoses: Same Active Problems:   S/P bilateral mastectomy   Invasive ductal carcinoma of breast, female, right ([place])   Ductal carcinoma in situ (DCIS) of left breast Anemia secondary to acute surgical blood loss  Discharged Condition: good  Hospital Course: Patient is a 68 year old black female with a known history of invasive ductal carcinoma of the right breast and ductal carcinoma in situ of the left breast who underwent neoadjuvant therapy.  She presented for a right modified radical mastectomy, left simple mastectomy on 12/01/2018.  She tolerated the procedures well.  Her postoperative course was remarkable for anemia secondary to acute surgical blood loss.  She did receive 2 units of packed red blood cells.  Her hematocrit responded appropriately.  Her vital signs are stable.  She is being discharged home on 12/03/2018 in good and improving condition.  Final pathology is pending.  Treatments: surgery: Right modified radical mastectomy, left simple mastectomy on 12/01/2018  Discharge Exam: Blood pressure (!) 144/80, pulse 82, temperature 98.2 F (36.8 C), temperature source Oral, resp. rate 18, height 5' (1.524 m), weight 66.2 kg, SpO2 99 %. General appearance: alert, cooperative and no distress Resp: clear to auscultation bilaterally Breasts: Bilateral mastectomy scars healing well.  JP drains with sanguinous drainage present. Cardio: regular rate and rhythm, S1, S2 normal, no murmur, click, rub or gallop  Disposition: Discharge disposition: 01-Home or Self Care       Discharge Instructions    Diet - low sodium heart healthy   Complete by:  As directed    Increase activity slowly   Complete by:  As directed      Allergies as of 12/03/2018      Reactions    Penicillins Other (See Comments)   Nausea, vomiting, dizziness Has patient had a PCN reaction causing immediate rash, facial/tongue/throat swelling, SOB or lightheadedness with hypotension: no Has patient had a PCN reaction causing severe rash involving mucus membranes or skin necrosis: no Has patient had a PCN reaction that required hospitalization: no Has patient had a PCN reaction occurring within the last 10 years: no If all of the above answers are "NO", then may proceed with Cephalosporin use.   Percocet [oxycodone-acetaminophen] Diarrhea, Nausea And Vomiting   Ranitidine Itching      Medication List    TAKE these medications   albuterol (2.5 MG/3ML) 0.083% nebulizer solution Commonly known as:  PROVENTIL Use via neb q 4 hrs prn wheezing What changed:    how much to take  how to take this  when to take this  reasons to take this   albuterol 108 (90 Base) MCG/ACT inhaler Commonly known as:  PROVENTIL HFA;VENTOLIN HFA INHALE TWO PUFFS INTO THE LUNGS EVERY SIX HOURS AS NEEDED FOR WHEEZING What changed:    how much to take  how to take this  when to take this  reasons to take this  additional instructions   amLODipine 5 MG tablet Commonly known as:  NORVASC Take one tablet daily What changed:    how much to take  how to take this  when to take this  additional instructions   anastrozole 1 MG tablet Commonly known as:  ARIMIDEX Take 1 tablet (1 mg total) by mouth daily.   aspirin 81 MG tablet Take 81 mg by mouth daily.   CALCIUM + VITAMIN  D3 PO Take 1 tablet by mouth 2 (two) times daily.   Flaxseed Oil 1000 MG Caps Take 1,000 mg by mouth daily.   lidocaine-prilocaine cream Commonly known as:  EMLA Apply to affected area once What changed:    how much to take  how to take this  when to take this  additional instructions   montelukast 10 MG tablet Commonly known as:  SINGULAIR TAKE ONE TABLET BY MOUTH EVERY NIGHT AT BEDTIME    potassium chloride SA 20 MEQ tablet Commonly known as:  K-DUR,KLOR-CON Take 1 tablet (20 mEq total) by mouth 2 (two) times daily.   traMADol 50 MG tablet Commonly known as:  ULTRAM Take 1 tablet (50 mg total) by mouth every 6 (six) hours as needed.   triamcinolone cream 0.1 % Commonly known as:  KENALOG Apply twice daily to affected areas. What changed:    how much to take  how to take this  when to take this  additional instructions            Durable Medical Equipment  (From admission, onward)         Start     Ordered   12/02/18 1251  For home use only DME Walker rolling  Once    Question:  Patient needs a walker to treat with the following condition  Answer:  General weakness   12/02/18 1250         Follow-up Information    Aviva Signs, MD. Schedule an appointment as soon as possible for a visit on 12/09/2018.   Specialty:  General Surgery Contact information: [address] [id number] [phone]           Signed: Aviva Signs 12/03/2018, 10:36 AM

## 2018-12-05 ENCOUNTER — Telehealth: Payer: Self-pay | Admitting: Emergency Medicine

## 2018-12-05 NOTE — Telephone Encounter (Signed)
Patients daughter called and stated the patient has been nauseous and has not had a bowel movement since Tuesday. Notified the daughter it is ok to give her mother nauseous medication but only as needed because it can cause constipation and so can the pain medication. I also notified [name] to give her mother food that are high in fiber to encourage bowel movements and to stay away from foods that can cause constipation such as bananas and apple sauce and to encourage water and Gatorade for hydration. [name] stated she understood and she would see [place] on Tuesday for her mothers f/u appt

## 2018-12-09 ENCOUNTER — Encounter: Payer: Self-pay | Admitting: General Surgery

## 2018-12-09 ENCOUNTER — Ambulatory Visit (INDEPENDENT_AMBULATORY_CARE_PROVIDER_SITE_OTHER): Payer: Self-pay | Admitting: General Surgery

## 2018-12-09 VITALS — BP 152/79 | HR 81 | Temp 98.6°F | Resp 16 | Wt 144.2 lb

## 2018-12-09 DIAGNOSIS — Z09 Encounter for follow-up examination after completed treatment for conditions other than malignant neoplasm: Secondary | ICD-10-CM

## 2018-12-09 NOTE — Progress Notes (Signed)
Subjective:     Whitney Velez  Here for follow-up, status post bilateral mastectomy.  Patient doing well.  He will having some drainage from both JP drains.  Is not having significant incisional pain. Objective:    BP (!) 152/79 (BP Location: Left Arm, Patient Position: Sitting, Cuff Size: Normal)   Pulse 81   Temp 98.6 F (37 C) (Temporal)   Resp 16   Wt 144 lb 3.2 oz (65.4 kg)   BMI 28.16 kg/m   General:  alert, cooperative and no distress  Breast: Both mastectomy incisions are healing well.  One half of staples removed.  JP drains with sanguinous drainage present, they were stripped of clots. Final pathology reviewed and discussed with patient.      Assessment:    Doing well postoperatively.    Plan:   Follow-up here in 1 week for wound check.

## 2018-12-16 ENCOUNTER — Encounter: Payer: Self-pay | Admitting: General Surgery

## 2018-12-16 ENCOUNTER — Ambulatory Visit (INDEPENDENT_AMBULATORY_CARE_PROVIDER_SITE_OTHER): Payer: Self-pay | Admitting: General Surgery

## 2018-12-16 VITALS — BP 139/20 | HR 70 | Temp 96.9°F | Resp 20 | Wt 139.0 lb

## 2018-12-16 DIAGNOSIS — Z09 Encounter for follow-up examination after completed treatment for conditions other than malignant neoplasm: Secondary | ICD-10-CM

## 2018-12-16 NOTE — Progress Notes (Signed)
Subjective:     Whitney Velez  Here for surgical follow-up.  Patient has been doing well.  She has been feeling somewhat weak, but this has been present for some time.  JP drainage has been very low. Objective:    BP (!) 139/20 (BP Location: Left Arm, Patient Position: Sitting, Cuff Size: Normal)   Pulse 70   Temp (!) 96.9 F (36.1 C) (Temporal)   Resp 20   Wt 139 lb (63 kg)   BMI 27.15 kg/m   General:  alert, cooperative and no distress  Bilateral mastectomy incisions healing well.  Remaining staples removed, Steri-Strips applied.  Both JP drains removed.     Assessment:    Doing well postoperatively.    Plan:   We will see patient again in 2 weeks for follow-up.  To see oncology next week.

## 2018-12-17 ENCOUNTER — Encounter (HOSPITAL_COMMUNITY): Payer: Self-pay | Admitting: Hematology

## 2018-12-17 ENCOUNTER — Inpatient Hospital Stay (HOSPITAL_BASED_OUTPATIENT_CLINIC_OR_DEPARTMENT_OTHER): Payer: Medicare Other | Admitting: Hematology

## 2018-12-17 ENCOUNTER — Other Ambulatory Visit: Payer: Self-pay

## 2018-12-17 ENCOUNTER — Inpatient Hospital Stay (HOSPITAL_COMMUNITY): Payer: Medicare Other | Attending: Hematology

## 2018-12-17 VITALS — BP 112/81 | HR 88 | Temp 98.1°F | Resp 16 | Wt 136.2 lb

## 2018-12-17 DIAGNOSIS — C50811 Malignant neoplasm of overlapping sites of right female breast: Secondary | ICD-10-CM

## 2018-12-17 DIAGNOSIS — Z9013 Acquired absence of bilateral breasts and nipples: Secondary | ICD-10-CM

## 2018-12-17 DIAGNOSIS — D0512 Intraductal carcinoma in situ of left breast: Secondary | ICD-10-CM | POA: Insufficient documentation

## 2018-12-17 DIAGNOSIS — M8580 Other specified disorders of bone density and structure, unspecified site: Secondary | ICD-10-CM

## 2018-12-17 DIAGNOSIS — Z170 Estrogen receptor positive status [ER+]: Secondary | ICD-10-CM

## 2018-12-17 DIAGNOSIS — C773 Secondary and unspecified malignant neoplasm of axilla and upper limb lymph nodes: Secondary | ICD-10-CM

## 2018-12-17 DIAGNOSIS — E876 Hypokalemia: Secondary | ICD-10-CM | POA: Insufficient documentation

## 2018-12-17 DIAGNOSIS — K5900 Constipation, unspecified: Secondary | ICD-10-CM

## 2018-12-17 DIAGNOSIS — R531 Weakness: Secondary | ICD-10-CM | POA: Diagnosis not present

## 2018-12-17 LAB — COMPREHENSIVE METABOLIC PANEL
ALT: 14 U/L (ref 0–44)
AST: 25 U/L (ref 15–41)
Albumin: 3.9 g/dL (ref 3.5–5.0)
Alkaline Phosphatase: 69 U/L (ref 38–126)
Anion gap: 12 (ref 5–15)
BUN: 20 mg/dL (ref 8–23)
CO2: 24 mmol/L (ref 22–32)
Calcium: 10.4 mg/dL — ABNORMAL HIGH (ref 8.9–10.3)
Chloride: 96 mmol/L — ABNORMAL LOW (ref 98–111)
Creatinine, Ser: 0.94 mg/dL (ref 0.44–1.00)
GFR calc Af Amer: >60 mL/min (ref >60)
GFR calc non Af Amer: >60 mL/min (ref >60)
Glucose, Bld: 108 mg/dL — ABNORMAL HIGH (ref 70–99)
Potassium: 3.6 mmol/L (ref 3.5–5.1)
Sodium: 132 mmol/L — ABNORMAL LOW (ref 135–145)
Total Bilirubin: 1 mg/dL (ref 0.3–1.2)
Total Protein: 7.7 g/dL (ref 6.5–8.1)

## 2018-12-17 LAB — CBC WITH DIFFERENTIAL/PLATELET
Abs Immature Granulocytes: 0.06 10*3/uL (ref 0.00–0.07)
BASOS ABS: 0 10*3/uL (ref 0.0–0.1)
BASOS PCT: 0 %
Eosinophils Absolute: 1.2 10*3/uL — ABNORMAL HIGH (ref 0.0–0.5)
Eosinophils Relative: 11 %
HCT: 33.3 % — ABNORMAL LOW (ref 36.0–46.0)
Hemoglobin: 10.4 g/dL — ABNORMAL LOW (ref 12.0–15.0)
Immature Granulocytes: 1 %
Lymphocytes Relative: 8 %
Lymphs Abs: 0.9 10*3/uL (ref 0.7–4.0)
MCH: 30.1 pg (ref 26.0–34.0)
MCHC: 31.2 g/dL (ref 30.0–36.0)
MCV: 96.5 fL (ref 80.0–100.0)
Monocytes Absolute: 1.2 10*3/uL — ABNORMAL HIGH (ref 0.1–1.0)
Monocytes Relative: 11 %
Neutro Abs: 7.8 10*3/uL — ABNORMAL HIGH (ref 1.7–7.7)
Neutrophils Relative %: 69 %
Platelets: 363 10*3/uL (ref 150–400)
RBC: 3.45 MIL/uL — ABNORMAL LOW (ref 3.87–5.11)
RDW: 13.6 % (ref 11.5–15.5)
WBC: 11.3 10*3/uL — AB (ref 4.0–10.5)
nRBC: 0 % (ref 0.0–0.2)

## 2018-12-17 MED ORDER — LACTULOSE 10 GM/15ML PO SOLN: 20.0000 g | Freq: Every day | ORAL | 0 refills | Status: DC

## 2018-12-17 NOTE — Patient Instructions (Signed)
[facility] Discharge Instructions     Thank you for choosing [facility] at [facility] to provide your oncology and hematology care.  To afford each patient quality time with our provider, please arrive at least 15 minutes before your scheduled appointment time.   If you have a lab appointment with the Cancer Center please come in thru the  Main Entrance and check in at the main information desk  You need to re-schedule your appointment should you arrive 10 or more minutes late.  We strive to give you quality time with our providers, and arriving late affects you and other patients whose appointments are after yours.  Also, if you no show three or more times for appointments you may be dismissed from the clinic at the providers discretion.     Again, thank you for choosing [facility].  Our hope is that these requests will decrease the amount of time that you wait before being seen by our physicians.       _____________________________________________________________  Should you have questions after your visit to [facility], please contact our office at [phone] between the hours of 8:00 a.m. and 4:30 p.m.  Voicemails left after 4:00 p.m. will not be returned until the following business day.  For prescription refill requests, have your pharmacy contact our office and allow 72 hours.    Cancer Center Support Programs:   > Cancer Support Group  2nd Tuesday of the month 1pm-2pm, Journey Room    

## 2018-12-17 NOTE — Progress Notes (Signed)
[name], [name] [address]   CLINIC:  Medical Oncology/Hematology  PCP:  Mikey Kirschner, [place] [address] [phone]  REASON FOR VISIT: Follow-up for right breast cancer, ER+/PR+/HER2-  CURRENT THERAPY: Finished paclitaxel now Starting Arimidex waiting for surgery  BRIEF ONCOLOGIC HISTORY:    Breast cancer, right ([facility])   05/12/2018 Initial Diagnosis    Breast cancer, right ([facility])    06/02/2018 -  Chemotherapy    The patient had DOXOrubicin (ADRIAMYCIN) chemo injection 110 mg, 60 mg/m2 = 110 mg, Intravenous,  Once, 4 of 4 cycles Administration: 110 mg (06/05/2018), 110 mg (06/19/2018), 110 mg (07/07/2018), 110 mg (07/21/2018) palonosetron (ALOXI) injection 0.25 mg, 0.25 mg, Intravenous,  Once, 4 of 4 cycles Administration: 0.25 mg (06/05/2018), 0.25 mg (06/19/2018), 0.25 mg (07/07/2018), 0.25 mg (07/21/2018) pegfilgrastim-cbqv (UDENYCA) injection 6 mg, 6 mg, Subcutaneous, Once, 4 of 4 cycles Administration: 6 mg (06/06/2018), 6 mg (06/20/2018), 6 mg (07/08/2018), 6 mg (07/22/2018) cyclophosphamide (CYTOXAN) 1,100 mg in sodium chloride 0.9 % 250 mL chemo infusion, 600 mg/m2 = 1,100 mg, Intravenous,  Once, 4 of 4 cycles Administration: 1,100 mg (06/05/2018), 1,100 mg (06/19/2018), 1,100 mg (07/07/2018), 1,100 mg (07/21/2018) PACLitaxel (TAXOL) 144 mg in sodium chloride 0.9 % 250 mL chemo infusion (</= 80m/m2), 80 mg/m2 = 144 mg, Intravenous,  Once, 12 of 12 cycles Administration: 144 mg (08/07/2018), 144 mg (08/14/2018), 144 mg (08/21/2018), 144 mg (08/28/2018), 144 mg (09/04/2018), 144 mg (09/11/2018), 144 mg (09/18/2018), 144 mg (09/25/2018), 144 mg (10/02/2018), 144 mg (10/09/2018), 144 mg (10/17/2018), 144 mg (10/24/2018) fosaprepitant (EMEND) 150 mg, dexamethasone (DECADRON) 12 mg in sodium chloride 0.9 % 145 mL IVPB, , Intravenous,  Once, 4 of 4 cycles Administration:  (06/05/2018),  (06/19/2018),  (07/07/2018),  (07/21/2018)  for chemotherapy  treatment.         INTERVAL HISTORY:  [name] returns for routine follow-up for right breast cancer. She is here today with her family. She is recovering well since surgery. She had her double mastectomy and she is feeling weak. She is needing a walker to get around. She has not had a bowel movement since she was discharged 12/4. Her pain has been well controlled and she only takes her pain medication at night time to help her sleep. She denies any bleeding or easy bruising. Denies any nausea, vomiting, or diarrhea. Denies any fevers or recent infections. She reports her appetite at 100% and her energy at 25%.    REVIEW OF SYSTEMS:  Review of Systems  Gastrointestinal: Positive for constipation.  Neurological: Positive for dizziness.  All other systems reviewed and are negative.    PAST MEDICAL/SURGICAL HISTORY:  Past Medical History:  Diagnosis Date  . Asthma   . Hypertension    Past Surgical History:  Procedure Laterality Date  . ABDOMINAL HYSTERECTOMY    . MASTECTOMY MODIFIED RADICAL Right 12/01/2018   Procedure: RIGHT MODIFIED RADICAL MASTECTOMY (Procedure #1);  Surgeon: JAviva Signs MD;  Location: [facility];  Service: General;  Laterality: Right;  . PORTACATH PLACEMENT Left 06/04/2018   Procedure: INSERTION PORT-A-CATH;  Surgeon: JAviva Signs MD;  Location: [facility];  Service: General;  Laterality: Left;  . SIMPLE MASTECTOMY WITH AXILLARY SENTINEL NODE BIOPSY Left 12/01/2018   Procedure: LEFT SIMPLE MASTECTOMY (Procedure #2);  Surgeon: JAviva Signs MD;  Location: [facility];  Service: General;  Laterality: Left;     SOCIAL HISTORY:  Social History   Socioeconomic History  .  Marital status: Widowed    Spouse name: Not on file  . Number of children: Not on file  . Years of education: Not on file  . Highest education level: Not on file  Occupational History  . Not on file  Social Needs  . Financial resource strain: Not on file  . Food insecurity:     Worry: Not on file    Inability: Not on file  . Transportation needs:    Medical: Not on file    Non-medical: Not on file  Tobacco Use  . Smoking status: Never Smoker  . Smokeless tobacco: Never Used  Substance and Sexual Activity  . Alcohol use: Never    Frequency: Never  . Drug use: Never  . Sexual activity: Not on file  Lifestyle  . Physical activity:    Days per week: Not on file    Minutes per session: Not on file  . Stress: Not on file  Relationships  . Social connections:    Talks on phone: Not on file    Gets together: Not on file    Attends religious service: Not on file    Active member of club or organization: Not on file    Attends meetings of clubs or organizations: Not on file    Relationship status: Not on file  . Intimate partner violence:    Fear of current or ex partner: Not on file    Emotionally abused: Not on file    Physically abused: Not on file    Forced sexual activity: Not on file  Other Topics Concern  . Not on file  Social History Narrative  . Not on file    FAMILY HISTORY:  History reviewed. No pertinent family history.  CURRENT MEDICATIONS:  Outpatient Encounter Medications as of 12/17/2018  Medication Sig  . albuterol (PROAIR HFA) 108 (90 Base) MCG/ACT inhaler INHALE TWO PUFFS INTO THE LUNGS EVERY SIX HOURS AS NEEDED FOR WHEEZING (Patient taking differently: Inhale 2 puffs into the lungs every 6 (six) hours as needed for wheezing or shortness of breath. )  . albuterol (PROVENTIL) (2.5 MG/3ML) 0.083% nebulizer solution Use via neb q 4 hrs prn wheezing (Patient taking differently: Take 2.5 mg by nebulization every 4 (four) hours as needed for wheezing or shortness of breath. Use via neb q 4 hrs prn wheezing)  . amLODipine (NORVASC) 5 MG tablet Take one tablet daily (Patient taking differently: Take 5 mg by mouth daily. )  . anastrozole (ARIMIDEX) 1 MG tablet Take 1 tablet (1 mg total) by mouth daily.  Marland Kitchen aspirin 81 MG tablet Take 81 mg by  mouth daily.  . Calcium Carb-Cholecalciferol (CALCIUM + VITAMIN D3 PO) Take 1 tablet by mouth 2 (two) times daily.  . Flaxseed, Linseed, (FLAXSEED OIL) 1000 MG CAPS Take 1,000 mg by mouth daily.   Marland Kitchen lidocaine-prilocaine (EMLA) cream Apply to affected area once (Patient taking differently: Apply 1 application topically daily. )  . montelukast (SINGULAIR) 10 MG tablet TAKE ONE TABLET BY MOUTH EVERY NIGHT AT BEDTIME (Patient taking differently: Take 10 mg by mouth at bedtime. )  . potassium chloride SA (K-DUR,KLOR-CON) 20 MEQ tablet Take 1 tablet (20 mEq total) by mouth 2 (two) times daily.  . traMADol (ULTRAM) 50 MG tablet Take 1 tablet (50 mg total) by mouth every 6 (six) hours as needed.  . triamcinolone cream (KENALOG) 0.1 % Apply twice daily to affected areas. (Patient taking differently: Apply 1 application topically 2 (two) times daily. )  .  lactulose (CHRONULAC) 10 GM/15ML solution Take 30 mLs (20 g total) by mouth daily.   No facility-administered encounter medications on file as of 12/17/2018.     ALLERGIES:  Allergies  Allergen Reactions  . Penicillins Other (See Comments)    Nausea, vomiting, dizziness Has patient had a PCN reaction causing immediate rash, facial/tongue/throat swelling, SOB or lightheadedness with hypotension: no Has patient had a PCN reaction causing severe rash involving mucus membranes or skin necrosis: no Has patient had a PCN reaction that required hospitalization: no Has patient had a PCN reaction occurring within the last 10 years: no If all of the above answers are "NO", then may proceed with Cephalosporin use.   Marland Kitchen Percocet [Oxycodone-Acetaminophen] Diarrhea and Nausea And Vomiting  . Ranitidine Itching     PHYSICAL EXAM:  ECOG Performance status: 1  Vitals:   12/17/18 1113  BP: 112/81  Pulse: 88  Resp: 16  Temp: 98.1 F (36.7 C)  SpO2: 100%   Filed Weights   12/17/18 1113  Weight: 136 lb 3.2 oz (61.8 kg)    Physical  Exam Constitutional:      Appearance: Normal appearance. She is normal weight.  Cardiovascular:     Rate and Rhythm: Normal rate and regular rhythm.     Heart sounds: Normal heart sounds.  Pulmonary:     Effort: Pulmonary effort is normal.     Breath sounds: Normal breath sounds.  Abdominal:     General: Abdomen is flat.     Palpations: Abdomen is soft.  Musculoskeletal: Normal range of motion.  Skin:    General: Skin is warm and dry.  Neurological:     Mental Status: She is alert and oriented to person, place, and time. Mental status is at baseline.  Psychiatric:        Mood and Affect: Mood normal.        Behavior: Behavior normal.        Thought Content: Thought content normal.        Judgment: Judgment normal.      LABORATORY DATA:  I have reviewed the labs as listed.  CBC    Component Value Date/Time   WBC 11.3 (H) 12/17/2018 0940   RBC 3.45 (L) 12/17/2018 0940   HGB 10.4 (L) 12/17/2018 0940   HGB 12.4 02/07/2017 1109   HCT 33.3 (L) 12/17/2018 0940   HCT 38.2 02/07/2017 1109   PLT 363 12/17/2018 0940   PLT 335 02/07/2017 1109   MCV 96.5 12/17/2018 0940   MCV 86 02/07/2017 1109   MCH 30.1 12/17/2018 0940   MCHC 31.2 12/17/2018 0940   RDW 13.6 12/17/2018 0940   RDW 15.1 02/07/2017 1109   LYMPHSABS 0.9 12/17/2018 0940   LYMPHSABS 3.0 02/07/2017 1109   MONOABS 1.2 (H) 12/17/2018 0940   EOSABS 1.2 (H) 12/17/2018 0940   EOSABS 1.1 (H) 02/07/2017 1109   BASOSABS 0.0 12/17/2018 0940   BASOSABS 0.0 02/07/2017 1109   CMP Latest Ref Rng & Units 12/17/2018 12/03/2018 12/02/2018  Glucose 70 - 99 mg/dL 108(H) 89 86  BUN 8 - 23 mg/dL '20 11 17  ' Creatinine 0.44 - 1.00 mg/dL 0.94 0.89 1.21(H)  Sodium 135 - 145 mmol/L 132(L) 137 135  Potassium 3.5 - 5.1 mmol/L 3.6 3.7 4.1  Chloride 98 - 111 mmol/L 96(L) 110 107  CO2 22 - 32 mmol/L 24 22 21(L)  Calcium 8.9 - 10.3 mg/dL 10.4(H) 8.3(L) 8.1(L)  Total Protein 6.5 - 8.1 g/dL 7.7 - -  Total Bilirubin  0.3 - 1.2 mg/dL 1.0 - -   Alkaline Phos 38 - 126 U/L 69 - -  AST 15 - 41 U/L 25 - -  ALT 0 - 44 U/L 14 - -       DIAGNOSTIC IMAGING:  I have independently reviewed the scans and discussed with the patient.   I have reviewed Francene Finders, NP's note and agree with the documentation.  I personally performed a face-to-face visit, made revisions and my assessment and plan is as follows.    ASSESSMENT & PLAN:   Breast cancer, right ([facility]) 1.  Locally advanced (stage IIIa, T3N1) right breast cancer: - Mammogram on 03/28/2018 was BI-RADS Category 0.  Mammogram and ultrasound on 04/22/2018 shows 5.9 x 5 x 6 cm mass extending in the upper quadrant of the right breast and right axillary lymph node measuring 2.8 x 1.6 x 1.7 cm.  Left breast has retroareolar mass.  Patient did not have mammogram in the last 20 years. - Biopsy on 04/29/2018 of the right breast mass consistent with IDC, grade 2-3, lymph node biopsy positive for metastatic disease, ER/PR positive, HER-2 negative, Ki-67 of 5% -Seen by Dr. Arnoldo Morale who recommended right modified radical mastectomy and left simple mastectomy. - Bone scan showed  increased uptake in the right greater trochanter and right sacroiliac joint.  CT scan of the chest abdomen and pelvis did not show any evidence of metastatic disease.  The uptake in the right greater trochanter was thought to be due to enthesopathic change.  Uptake in the right sacroiliac joint was likely from degenerative changes at the right L5-S1 joint. - Due to advanced nature of her breast cancer, we have recommended neoadjuvant chemotherapy with dose dense AC followed by weekly paclitaxel for 12 weeks. - 4 cycles of dose dense AC from 06/05/2018 through 07/21/2018. - 12 cycles of weekly paclitaxel from 08/07/2018 through 10/23/2018.  Weekly paclitaxel started on 08/07/2018.  - Last physical examination revealed 4 to 5 cm mass in the right breast, softer, freely mobile.  Very small lymph node palpable in the right axilla.  Left  breast has nonspecific mass in the center. -Anastrozole started on 11/07/2018. - Right modified radical mastectomy on 12/01/2018 with left simple mastectomy. 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 -We have called pathology and requested ER/PR and HER-2/neu staining on the mastectomy.  This will be reported in 2 to 3 days. -She is complaining of weakness in her legs.  We will order physical therapy. - I have recommended doing a whole-body PET CT scan.  We will see her back in 3 to 4 weeks.  2.  Left breast DCIS: -Biopsy on 04/29/2018 of the left breast shows DCIS, ER/PR positive.  Left axillary lymph node biopsy was negative for malignancy. -Left simple mastectomy on 12/01/2018 shows 7 cm DCIS, intermediate grade, with cribriform, papillary and cystic morphology.  No evidence of invasion.  3.  Hypokalemia: -Potassium is normal today.  She will continue potassium supplements.  4.  Osteopenia: - DEXA scan on 11/13/2018 shows T score of -0.8.  She is taking calcium and vitamin D. -I will talk to her about initiating on Prolia at next visit.       Orders placed this encounter:  Orders Placed This Encounter  Procedures  . NM PET Image Initial (PI)  Skull Base To Thigh  . CBC with Differential/Platelet  . Comprehensive metabolic panel      Derek Jack, MD [place] [phone]

## 2018-12-17 NOTE — Assessment & Plan Note (Addendum)
1.  Locally advanced (stage IIIa, T3N1) right breast cancer: - Mammogram on 03/28/2018 was BI-RADS Category 0.  Mammogram and ultrasound on 04/22/2018 shows 5.9 x 5 x 6 cm mass extending in the upper quadrant of the right breast and right axillary lymph node measuring 2.8 x 1.6 x 1.7 cm.  Left breast has retroareolar mass.  Patient did not have mammogram in the last 20 years. - Biopsy on 04/29/2018 of the right breast mass consistent with IDC, grade 2-3, lymph node biopsy positive for metastatic disease, ER/PR positive, HER-2 negative, Ki-67 of 5% -Seen by Dr. Arnoldo Morale who recommended right modified radical mastectomy and left simple mastectomy. - Bone scan showed  increased uptake in the right greater trochanter and right sacroiliac joint.  CT scan of the chest abdomen and pelvis did not show any evidence of metastatic disease.  The uptake in the right greater trochanter was thought to be due to enthesopathic change.  Uptake in the right sacroiliac joint was likely from degenerative changes at the right L5-S1 joint. - Due to advanced nature of her breast cancer, we have recommended neoadjuvant chemotherapy with dose dense AC followed by weekly paclitaxel for 12 weeks. - 4 cycles of dose dense AC from 06/05/2018 through 07/21/2018. - 12 cycles of weekly paclitaxel from 08/07/2018 through 10/23/2018.  Weekly paclitaxel started on 08/07/2018.  - Last physical examination revealed 4 to 5 cm mass in the right breast, softer, freely mobile.  Very small lymph node palpable in the right axilla.  Left breast has nonspecific mass in the center. -Anastrozole started on 11/07/2018. - Right modified radical mastectomy on 12/01/2018 with left simple mastectomy. 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 -We have called  pathology and requested ER/PR and HER-2/neu staining on the mastectomy.  This will be reported in 2 to 3 days. -She is complaining of weakness in her legs.  We will order physical therapy. - I have recommended doing a whole-body PET CT scan.  We will see her back in 3 to 4 weeks.  2.  Left breast DCIS: -Biopsy on 04/29/2018 of the left breast shows DCIS, ER/PR positive.  Left axillary lymph node biopsy was negative for malignancy. -Left simple mastectomy on 12/01/2018 shows 7 cm DCIS, intermediate grade, with cribriform, papillary and cystic morphology.  No evidence of invasion.  3.  Hypokalemia: -Potassium is normal today.  She will continue potassium supplements.  4.  Osteopenia: - DEXA scan on 11/13/2018 shows T score of -0.8.  She is taking calcium and vitamin D. -I will talk to her about initiating on Prolia at next visit.

## 2018-12-18 ENCOUNTER — Other Ambulatory Visit (HOSPITAL_COMMUNITY): Payer: Self-pay | Admitting: Nurse Practitioner

## 2018-12-19 ENCOUNTER — Other Ambulatory Visit (HOSPITAL_COMMUNITY): Payer: Self-pay | Admitting: Nurse Practitioner

## 2018-12-19 NOTE — Progress Notes (Signed)
This patient would benefit from having home Physical Therapy for her gait, strengthening, balance, and endurance. She is very weak and she is unsafe to ambulate due to her treatment and deconditioning.

## 2018-12-26 DIAGNOSIS — C50911 Malignant neoplasm of unspecified site of right female breast: Secondary | ICD-10-CM | POA: Diagnosis not present

## 2018-12-26 DIAGNOSIS — I10 Essential (primary) hypertension: Secondary | ICD-10-CM | POA: Diagnosis not present

## 2018-12-26 DIAGNOSIS — R262 Difficulty in walking, not elsewhere classified: Secondary | ICD-10-CM | POA: Diagnosis not present

## 2018-12-26 DIAGNOSIS — J45909 Unspecified asthma, uncomplicated: Secondary | ICD-10-CM | POA: Diagnosis not present

## 2018-12-26 DIAGNOSIS — Z483 Aftercare following surgery for neoplasm: Secondary | ICD-10-CM | POA: Diagnosis not present

## 2018-12-26 DIAGNOSIS — M6281 Muscle weakness (generalized): Secondary | ICD-10-CM | POA: Diagnosis not present

## 2018-12-26 DIAGNOSIS — Z9013 Acquired absence of bilateral breasts and nipples: Secondary | ICD-10-CM | POA: Diagnosis not present

## 2018-12-26 DIAGNOSIS — Z79891 Long term (current) use of opiate analgesic: Secondary | ICD-10-CM | POA: Diagnosis not present

## 2018-12-26 DIAGNOSIS — D0512 Intraductal carcinoma in situ of left breast: Secondary | ICD-10-CM | POA: Diagnosis not present

## 2018-12-26 DIAGNOSIS — Z7982 Long term (current) use of aspirin: Secondary | ICD-10-CM | POA: Diagnosis not present

## 2018-12-26 DIAGNOSIS — M8580 Other specified disorders of bone density and structure, unspecified site: Secondary | ICD-10-CM | POA: Diagnosis not present

## 2018-12-29 DIAGNOSIS — C50911 Malignant neoplasm of unspecified site of right female breast: Secondary | ICD-10-CM | POA: Diagnosis not present

## 2018-12-29 DIAGNOSIS — D0512 Intraductal carcinoma in situ of left breast: Secondary | ICD-10-CM | POA: Diagnosis not present

## 2018-12-29 DIAGNOSIS — Z483 Aftercare following surgery for neoplasm: Secondary | ICD-10-CM | POA: Diagnosis not present

## 2018-12-29 DIAGNOSIS — R262 Difficulty in walking, not elsewhere classified: Secondary | ICD-10-CM | POA: Diagnosis not present

## 2018-12-29 DIAGNOSIS — M6281 Muscle weakness (generalized): Secondary | ICD-10-CM | POA: Diagnosis not present

## 2018-12-29 DIAGNOSIS — Z9013 Acquired absence of bilateral breasts and nipples: Secondary | ICD-10-CM | POA: Diagnosis not present

## 2018-12-30 DIAGNOSIS — M6281 Muscle weakness (generalized): Secondary | ICD-10-CM | POA: Diagnosis not present

## 2018-12-30 DIAGNOSIS — R262 Difficulty in walking, not elsewhere classified: Secondary | ICD-10-CM | POA: Diagnosis not present

## 2018-12-30 DIAGNOSIS — Z9013 Acquired absence of bilateral breasts and nipples: Secondary | ICD-10-CM | POA: Diagnosis not present

## 2018-12-30 DIAGNOSIS — D0512 Intraductal carcinoma in situ of left breast: Secondary | ICD-10-CM | POA: Diagnosis not present

## 2018-12-30 DIAGNOSIS — Z483 Aftercare following surgery for neoplasm: Secondary | ICD-10-CM | POA: Diagnosis not present

## 2018-12-30 DIAGNOSIS — C50911 Malignant neoplasm of unspecified site of right female breast: Secondary | ICD-10-CM | POA: Diagnosis not present

## 2019-01-01 ENCOUNTER — Ambulatory Visit (INDEPENDENT_AMBULATORY_CARE_PROVIDER_SITE_OTHER): Payer: Self-pay | Admitting: General Surgery

## 2019-01-01 ENCOUNTER — Encounter: Payer: Self-pay | Admitting: General Surgery

## 2019-01-01 VITALS — BP 121/80 | HR 66 | Temp 96.8°F | Resp 16 | Wt 133.0 lb

## 2019-01-01 DIAGNOSIS — Z09 Encounter for follow-up examination after completed treatment for conditions other than malignant neoplasm: Secondary | ICD-10-CM

## 2019-01-01 MED ORDER — TRAMADOL HCL 50 MG PO TABS: 50.0000 mg | ORAL_TABLET | Freq: Four times a day (QID) | ORAL | 0 refills | Status: DC | PRN

## 2019-01-01 NOTE — Progress Notes (Signed)
Subjective:     Whitney Velez  Here for routine postoperative visit.  Patient states that she is a little under the weather.  Her appetite is decreased.  She denies any fever, chills.  No drainage has been noted from the wounds.  She is to see oncology soon. Objective:    BP 121/80 (BP Location: Left Arm, Patient Position: Sitting, Cuff Size: Normal)   Pulse 66   Temp (!) 96.8 F (36 C) (Temporal)   Resp 16   Wt 133 lb (60.3 kg)   BMI 25.97 kg/m   General:  alert, cooperative and no distress  Both mastectomy sites well-healed.  No hematoma or drainage noted.     Assessment:    Doing well from surgical standpoint.    Plan:   Follow-up here as needed.

## 2019-01-02 DIAGNOSIS — M6281 Muscle weakness (generalized): Secondary | ICD-10-CM | POA: Diagnosis not present

## 2019-01-02 DIAGNOSIS — R262 Difficulty in walking, not elsewhere classified: Secondary | ICD-10-CM | POA: Diagnosis not present

## 2019-01-02 DIAGNOSIS — D0512 Intraductal carcinoma in situ of left breast: Secondary | ICD-10-CM | POA: Diagnosis not present

## 2019-01-02 DIAGNOSIS — Z9013 Acquired absence of bilateral breasts and nipples: Secondary | ICD-10-CM | POA: Diagnosis not present

## 2019-01-02 DIAGNOSIS — Z483 Aftercare following surgery for neoplasm: Secondary | ICD-10-CM | POA: Diagnosis not present

## 2019-01-02 DIAGNOSIS — C50911 Malignant neoplasm of unspecified site of right female breast: Secondary | ICD-10-CM | POA: Diagnosis not present

## 2019-01-05 ENCOUNTER — Encounter (HOSPITAL_COMMUNITY): Payer: Medicare Other

## 2019-01-05 ENCOUNTER — Encounter (HOSPITAL_COMMUNITY)
Admission: RE | Admit: 2019-01-05 | Discharge: 2019-01-05 | Disposition: A | Discharge status: Home / Self Care | Payer: Medicare Other | Source: Ambulatory Visit | Attending: Nurse Practitioner | Admitting: Nurse Practitioner

## 2019-01-05 ENCOUNTER — Telehealth: Payer: Self-pay | Admitting: Family Medicine

## 2019-01-05 ENCOUNTER — Inpatient Hospital Stay (HOSPITAL_COMMUNITY): Payer: Medicare Other | Attending: Hematology

## 2019-01-05 DIAGNOSIS — C50911 Malignant neoplasm of unspecified site of right female breast: Secondary | ICD-10-CM | POA: Diagnosis not present

## 2019-01-05 DIAGNOSIS — C50811 Malignant neoplasm of overlapping sites of right female breast: Secondary | ICD-10-CM

## 2019-01-05 DIAGNOSIS — D0512 Intraductal carcinoma in situ of left breast: Secondary | ICD-10-CM | POA: Insufficient documentation

## 2019-01-05 DIAGNOSIS — Z170 Estrogen receptor positive status [ER+]: Secondary | ICD-10-CM | POA: Diagnosis not present

## 2019-01-05 DIAGNOSIS — C773 Secondary and unspecified malignant neoplasm of axilla and upper limb lymph nodes: Secondary | ICD-10-CM | POA: Insufficient documentation

## 2019-01-05 DIAGNOSIS — C50919 Malignant neoplasm of unspecified site of unspecified female breast: Secondary | ICD-10-CM | POA: Diagnosis not present

## 2019-01-05 DIAGNOSIS — M6281 Muscle weakness (generalized): Secondary | ICD-10-CM | POA: Diagnosis not present

## 2019-01-05 DIAGNOSIS — R262 Difficulty in walking, not elsewhere classified: Secondary | ICD-10-CM | POA: Diagnosis not present

## 2019-01-05 DIAGNOSIS — Z483 Aftercare following surgery for neoplasm: Secondary | ICD-10-CM | POA: Diagnosis not present

## 2019-01-05 DIAGNOSIS — Z9013 Acquired absence of bilateral breasts and nipples: Secondary | ICD-10-CM | POA: Diagnosis not present

## 2019-01-05 LAB — CBC WITH DIFFERENTIAL/PLATELET
Abs Immature Granulocytes: 0.02 10*3/uL (ref 0.00–0.07)
Basophils Absolute: 0 10*3/uL (ref 0.0–0.1)
Basophils Relative: 0 %
Eosinophils Absolute: 0.3 10*3/uL (ref 0.0–0.5)
Eosinophils Relative: 4 %
HCT: 34.9 % — ABNORMAL LOW (ref 36.0–46.0)
Hemoglobin: 11 g/dL — ABNORMAL LOW (ref 12.0–15.0)
Immature Granulocytes: 0 %
Lymphocytes Relative: 16 %
Lymphs Abs: 1.1 10*3/uL (ref 0.7–4.0)
MCH: 29.7 pg (ref 26.0–34.0)
MCHC: 31.5 g/dL (ref 30.0–36.0)
MCV: 94.3 fL (ref 80.0–100.0)
Monocytes Absolute: 0.9 10*3/uL (ref 0.1–1.0)
Monocytes Relative: 12 %
NEUTROS PCT: 68 %
Neutro Abs: 4.9 10*3/uL (ref 1.7–7.7)
PLATELETS: 243 10*3/uL (ref 150–400)
RBC: 3.7 MIL/uL — ABNORMAL LOW (ref 3.87–5.11)
RDW: 13.4 % (ref 11.5–15.5)
WBC: 7.3 10*3/uL (ref 4.0–10.5)
nRBC: 0 % (ref 0.0–0.2)

## 2019-01-05 LAB — COMPREHENSIVE METABOLIC PANEL
ALT: 14 U/L (ref 0–44)
AST: 22 U/L (ref 15–41)
Albumin: 4 g/dL (ref 3.5–5.0)
Alkaline Phosphatase: 59 U/L (ref 38–126)
Anion gap: 10 (ref 5–15)
BUN: 15 mg/dL (ref 8–23)
CHLORIDE: 104 mmol/L (ref 98–111)
CO2: 22 mmol/L (ref 22–32)
Calcium: 10.2 mg/dL (ref 8.9–10.3)
Creatinine, Ser: 0.9 mg/dL (ref 0.44–1.00)
GFR calc Af Amer: >60 mL/min (ref >60)
GFR calc non Af Amer: >60 mL/min (ref >60)
Glucose, Bld: 79 mg/dL (ref 70–99)
Potassium: 4.2 mmol/L (ref 3.5–5.1)
Sodium: 136 mmol/L (ref 135–145)
Total Bilirubin: 0.7 mg/dL (ref 0.3–1.2)
Total Protein: 7.7 g/dL (ref 6.5–8.1)

## 2019-01-05 MED ORDER — FLUDEOXYGLUCOSE F - 18 (FDG) INJECTION
7.7400 | Freq: Once | INTRAVENOUS | Status: AC | PRN
  Administered 2019-01-05: 7.74 via INTRAVENOUS

## 2019-01-05 NOTE — Telephone Encounter (Signed)
Verbal order for OT given as requested

## 2019-01-05 NOTE — Telephone Encounter (Signed)
ok 

## 2019-01-05 NOTE — Telephone Encounter (Signed)
Kat with [facility] is requesting orders for OT 3 times for 2 weeks and 2 times for 2 weeks.   CB# [phone] ok to LVM

## 2019-01-06 DIAGNOSIS — Z9013 Acquired absence of bilateral breasts and nipples: Secondary | ICD-10-CM | POA: Diagnosis not present

## 2019-01-06 DIAGNOSIS — R262 Difficulty in walking, not elsewhere classified: Secondary | ICD-10-CM | POA: Diagnosis not present

## 2019-01-06 DIAGNOSIS — D0512 Intraductal carcinoma in situ of left breast: Secondary | ICD-10-CM | POA: Diagnosis not present

## 2019-01-06 DIAGNOSIS — C50911 Malignant neoplasm of unspecified site of right female breast: Secondary | ICD-10-CM | POA: Diagnosis not present

## 2019-01-06 DIAGNOSIS — Z483 Aftercare following surgery for neoplasm: Secondary | ICD-10-CM | POA: Diagnosis not present

## 2019-01-06 DIAGNOSIS — M6281 Muscle weakness (generalized): Secondary | ICD-10-CM | POA: Diagnosis not present

## 2019-01-07 ENCOUNTER — Encounter (HOSPITAL_COMMUNITY): Payer: Self-pay | Admitting: Hematology

## 2019-01-07 ENCOUNTER — Inpatient Hospital Stay (HOSPITAL_COMMUNITY): Payer: Medicare Other | Attending: Hematology | Admitting: Hematology

## 2019-01-07 VITALS — BP 107/64 | HR 93 | Resp 18 | Wt 130.0 lb

## 2019-01-07 DIAGNOSIS — C50911 Malignant neoplasm of unspecified site of right female breast: Secondary | ICD-10-CM | POA: Diagnosis not present

## 2019-01-07 DIAGNOSIS — Z170 Estrogen receptor positive status [ER+]: Secondary | ICD-10-CM | POA: Diagnosis not present

## 2019-01-07 DIAGNOSIS — M8580 Other specified disorders of bone density and structure, unspecified site: Secondary | ICD-10-CM | POA: Insufficient documentation

## 2019-01-07 DIAGNOSIS — R5383 Other fatigue: Secondary | ICD-10-CM | POA: Diagnosis not present

## 2019-01-07 DIAGNOSIS — M6281 Muscle weakness (generalized): Secondary | ICD-10-CM | POA: Diagnosis not present

## 2019-01-07 DIAGNOSIS — D0512 Intraductal carcinoma in situ of left breast: Secondary | ICD-10-CM | POA: Insufficient documentation

## 2019-01-07 DIAGNOSIS — E876 Hypokalemia: Secondary | ICD-10-CM | POA: Diagnosis not present

## 2019-01-07 DIAGNOSIS — Z483 Aftercare following surgery for neoplasm: Secondary | ICD-10-CM | POA: Diagnosis not present

## 2019-01-07 DIAGNOSIS — Z9013 Acquired absence of bilateral breasts and nipples: Secondary | ICD-10-CM | POA: Insufficient documentation

## 2019-01-07 DIAGNOSIS — R531 Weakness: Secondary | ICD-10-CM

## 2019-01-07 DIAGNOSIS — C773 Secondary and unspecified malignant neoplasm of axilla and upper limb lymph nodes: Secondary | ICD-10-CM | POA: Insufficient documentation

## 2019-01-07 DIAGNOSIS — C50811 Malignant neoplasm of overlapping sites of right female breast: Secondary | ICD-10-CM | POA: Insufficient documentation

## 2019-01-07 DIAGNOSIS — R262 Difficulty in walking, not elsewhere classified: Secondary | ICD-10-CM | POA: Diagnosis not present

## 2019-01-07 NOTE — Assessment & Plan Note (Signed)
1.  Locally advanced (stage IIIa, T3N1) right breast cancer: - Mammogram on 03/28/2018 was BI-RADS Category 0.  Mammogram and ultrasound on 04/22/2018 shows 5.9 x 5 x 6 cm mass extending in the upper quadrant of the right breast and right axillary lymph node measuring 2.8 x 1.6 x 1.7 cm.  Left breast has retroareolar mass.  Patient did not have mammogram in the last 20 years. - Biopsy on 04/29/2018 of the right breast mass consistent with IDC, grade 2-3, lymph node biopsy positive for metastatic disease, ER/PR positive, HER-2 negative, Ki-67 of 5% -Seen by Dr. Arnoldo Morale who recommended right modified radical mastectomy and left simple mastectomy. - Bone scan showed  increased uptake in the right greater trochanter and right sacroiliac joint.  CT scan of the chest abdomen and pelvis did not show any evidence of metastatic disease.  The uptake in the right greater trochanter was thought to be due to enthesopathic change.  Uptake in the right sacroiliac joint was likely from degenerative changes at the right L5-S1 joint. - Due to advanced nature of her breast cancer, we have recommended neoadjuvant chemotherapy with dose dense AC followed by weekly paclitaxel for 12 weeks. - 4 cycles of dose dense AC from 06/05/2018 through 07/21/2018. - 12 cycles of weekly paclitaxel from 08/07/2018 through 10/23/2018.  Weekly paclitaxel started on 08/07/2018.  - Last physical examination revealed 4 to 5 cm mass in the right breast, softer, freely mobile.  Very small lymph node palpable in the right axilla.  Left breast has nonspecific mass in the center. -Anastrozole started on 11/07/2018. - Right modified radical mastectomy on 12/01/2018 with left simple mastectomy. 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 -We have ordered and  reviewed receptor status.  ER was positive, PR was negative and HER-2 was negative.  Ki-67 was less than 1%. - She is tolerating anastrozole very well.  Her lower extremity strength has improved after physical therapy.  She is able to walk without walker. - I reviewed the results of the PET CT scan dated 01/05/2019 which did not show any recurrence or metastatic disease. - I have recommended physical exercise on a regular basis.  She will come back in 4 months for follow-up.  2.  Left breast DCIS: -Biopsy on 04/29/2018 of the left breast shows DCIS, ER/PR positive.  Left axillary lymph node biopsy was negative for malignancy. -Left simple mastectomy on 12/01/2018 shows 7 cm DCIS, intermediate grade, with cribriform, papillary and cystic morphology.  No evidence of invasion.  3.  Hypokalemia: -Potassium is 4.2.  She will continue potassium supplement.  4.  Osteopenia: - DEXA scan on 11/13/2018 shows T score of -0.8.   -She is taking calcium and vitamin D twice daily. -I have recommended Prolia every 6 months.  She has not seen a dentist recently.  She will see a dentist and a get clearance. -We talked about the side effects including rare chance of osteonecrosis of the jaw.

## 2019-01-07 NOTE — Progress Notes (Signed)
[place], [place] [address]   CLINIC:  Medical Oncology/Hematology  PCP:  Mikey Kirschner, [place] [address] [phone]   REASON FOR VISIT: Follow-up for right breast cancer, ER+/PR+/HER2-  CURRENT THERAPY:Finished paclitaxel now StartingArimidexwaiting for surgery   BRIEF ONCOLOGIC HISTORY:    Breast cancer, right ([place])   05/12/2018 Initial Diagnosis    Breast cancer, right ([place])    06/02/2018 -  Chemotherapy    The patient had DOXOrubicin (ADRIAMYCIN) chemo injection 110 mg, 60 mg/m2 = 110 mg, Intravenous,  Once, 4 of 4 cycles Administration: 110 mg (06/05/2018), 110 mg (06/19/2018), 110 mg (07/07/2018), 110 mg (07/21/2018) palonosetron (ALOXI) injection 0.25 mg, 0.25 mg, Intravenous,  Once, 4 of 4 cycles Administration: 0.25 mg (06/05/2018), 0.25 mg (06/19/2018), 0.25 mg (07/07/2018), 0.25 mg (07/21/2018) pegfilgrastim-cbqv (UDENYCA) injection 6 mg, 6 mg, Subcutaneous, Once, 4 of 4 cycles Administration: 6 mg (06/06/2018), 6 mg (06/20/2018), 6 mg (07/08/2018), 6 mg (07/22/2018) cyclophosphamide (CYTOXAN) 1,100 mg in sodium chloride 0.9 % 250 mL chemo infusion, 600 mg/m2 = 1,100 mg, Intravenous,  Once, 4 of 4 cycles Administration: 1,100 mg (06/05/2018), 1,100 mg (06/19/2018), 1,100 mg (07/07/2018), 1,100 mg (07/21/2018) PACLitaxel (TAXOL) 144 mg in sodium chloride 0.9 % 250 mL chemo infusion (</= 40m/m2), 80 mg/m2 = 144 mg, Intravenous,  Once, 12 of 12 cycles Administration: 144 mg (08/07/2018), 144 mg (08/14/2018), 144 mg (08/21/2018), 144 mg (08/28/2018), 144 mg (09/04/2018), 144 mg (09/11/2018), 144 mg (09/18/2018), 144 mg (09/25/2018), 144 mg (10/02/2018), 144 mg (10/09/2018), 144 mg (10/17/2018), 144 mg (10/24/2018) fosaprepitant (EMEND) 150 mg, dexamethasone (DECADRON) 12 mg in sodium chloride 0.9 % 145 mL IVPB, , Intravenous,  Once, 4 of 4 cycles Administration:  (06/05/2018),  (06/19/2018),  (07/07/2018),  (07/21/2018)  for  chemotherapy treatment.       INTERVAL HISTORY:  [name].[id number]. female returns for routine follow-up for right breast cancer. She is here today with her family. She is feeling very tired and weak. She reports she does feel stronger than her last visit but still fatigued. Denies any nausea, vomiting, or diarrhea. Denies any new pains. Had not noticed any recent bleeding such as epistaxis, hematuria or hematochezia. Denies recent chest pain on exertion, shortness of breath on minimal exertion, pre-syncopal episodes, or palpitations. Denies any numbness or tingling in hands or feet. Denies any recent fevers, infections, or recent hospitalizations. She reports her appetite and energy level at 25%.    REVIEW OF SYSTEMS:  Review of Systems  Constitutional: Positive for fatigue.  Neurological: Positive for numbness.  All other systems reviewed and are negative.    PAST MEDICAL/SURGICAL HISTORY:  Past Medical History:  Diagnosis Date  . Asthma   . Hypertension    Past Surgical History:  Procedure Laterality Date  . ABDOMINAL HYSTERECTOMY    . MASTECTOMY MODIFIED RADICAL Right 12/01/2018   Procedure: RIGHT MODIFIED RADICAL MASTECTOMY (Procedure #1);  Surgeon: JAviva Signs MD;  Location: [facility];  Service: General;  Laterality: Right;  . PORTACATH PLACEMENT Left 06/04/2018   Procedure: INSERTION PORT-A-CATH;  Surgeon: JAviva Signs MD;  Location: [facility];  Service: General;  Laterality: Left;  . SIMPLE MASTECTOMY WITH AXILLARY SENTINEL NODE BIOPSY Left 12/01/2018   Procedure: LEFT SIMPLE MASTECTOMY (Procedure #2);  Surgeon: JAviva Signs MD;  Location: [facility];  Service: General;  Laterality: Left;     SOCIAL HISTORY:  Social History   Socioeconomic History  . Marital status: Widowed  Spouse name: Not on file  . Number of children: Not on file  . Years of education: Not on file  . Highest education level: Not on file  Occupational History  . Not on file  Social Needs  .  Financial resource strain: Not on file  . Food insecurity:    Worry: Not on file    Inability: Not on file  . Transportation needs:    Medical: Not on file    Non-medical: Not on file  Tobacco Use  . Smoking status: Never Smoker  . Smokeless tobacco: Never Used  Substance and Sexual Activity  . Alcohol use: Never    Frequency: Never  . Drug use: Never  . Sexual activity: Not on file  Lifestyle  . Physical activity:    Days per week: Not on file    Minutes per session: Not on file  . Stress: Not on file  Relationships  . Social connections:    Talks on phone: Not on file    Gets together: Not on file    Attends religious service: Not on file    Active member of club or organization: Not on file    Attends meetings of clubs or organizations: Not on file    Relationship status: Not on file  . Intimate partner violence:    Fear of current or ex partner: Not on file    Emotionally abused: Not on file    Physically abused: Not on file    Forced sexual activity: Not on file  Other Topics Concern  . Not on file  Social History Narrative  . Not on file    FAMILY HISTORY:  History reviewed. No pertinent family history.  CURRENT MEDICATIONS:  Outpatient Encounter Medications as of 01/07/2019  Medication Sig  . albuterol (PROAIR HFA) 108 (90 Base) MCG/ACT inhaler INHALE TWO PUFFS INTO THE LUNGS EVERY SIX HOURS AS NEEDED FOR WHEEZING (Patient taking differently: Inhale 2 puffs into the lungs every 6 (six) hours as needed for wheezing or shortness of breath. )  . albuterol (PROVENTIL) (2.5 MG/3ML) 0.083% nebulizer solution Use via neb q 4 hrs prn wheezing (Patient taking differently: Take 2.5 mg by nebulization every 4 (four) hours as needed for wheezing or shortness of breath. Use via neb q 4 hrs prn wheezing)  . amLODipine (NORVASC) 5 MG tablet Take one tablet daily (Patient taking differently: Take 5 mg by mouth daily. )  . anastrozole (ARIMIDEX) 1 MG tablet Take 1 tablet (1 mg  total) by mouth daily.  Marland Kitchen aspirin 81 MG tablet Take 81 mg by mouth daily.  . Calcium Carb-Cholecalciferol (CALCIUM + VITAMIN D3 PO) Take 1 tablet by mouth 2 (two) times daily.  . Flaxseed, Linseed, (FLAXSEED OIL) 1000 MG CAPS Take 1,000 mg by mouth daily.   Marland Kitchen lactulose (CHRONULAC) 10 GM/15ML solution Take 30 mLs (20 g total) by mouth daily.  Marland Kitchen lidocaine-prilocaine (EMLA) cream Apply to affected area once (Patient taking differently: Apply 1 application topically daily. )  . montelukast (SINGULAIR) 10 MG tablet TAKE ONE TABLET BY MOUTH EVERY NIGHT AT BEDTIME (Patient taking differently: Take 10 mg by mouth at bedtime. )  . potassium chloride SA (K-DUR,KLOR-CON) 20 MEQ tablet Take 1 tablet (20 mEq total) by mouth 2 (two) times daily.  . traMADol (ULTRAM) 50 MG tablet Take 1 tablet (50 mg total) by mouth every 6 (six) hours as needed.  . triamcinolone cream (KENALOG) 0.1 % Apply twice daily to affected areas. (Patient taking differently:  Apply 1 application topically 2 (two) times daily. )   No facility-administered encounter medications on file as of 01/07/2019.     ALLERGIES:  Allergies  Allergen Reactions  . Penicillins Other (See Comments)    Nausea, vomiting, dizziness Has patient had a PCN reaction causing immediate rash, facial/tongue/throat swelling, SOB or lightheadedness with hypotension: no Has patient had a PCN reaction causing severe rash involving mucus membranes or skin necrosis: no Has patient had a PCN reaction that required hospitalization: no Has patient had a PCN reaction occurring within the last 10 years: no If all of the above answers are "NO", then may proceed with Cephalosporin use.   Marland Kitchen Percocet [Oxycodone-Acetaminophen] Diarrhea and Nausea And Vomiting  . Ranitidine Itching     PHYSICAL EXAM:  ECOG Performance status: 1  Vitals:   01/07/19 1023  BP: 107/64  Pulse: 93  Resp: 18  SpO2: 100%   Filed Weights   01/07/19 1023  Weight: 130 lb (59 kg)     Physical Exam Constitutional:      Appearance: Normal appearance. She is normal weight.  Musculoskeletal: Normal range of motion.  Skin:    General: Skin is warm and dry.  Neurological:     Mental Status: She is alert and oriented to person, place, and time. Mental status is at baseline.  Psychiatric:        Mood and Affect: Mood normal.        Behavior: Behavior normal.        Thought Content: Thought content normal.        Judgment: Judgment normal.      LABORATORY DATA:  I have reviewed the labs as listed.  CBC    Component Value Date/Time   WBC 7.3 01/05/2019 1559   RBC 3.70 (L) 01/05/2019 1559   HGB 11.0 (L) 01/05/2019 1559   HGB 12.4 02/07/2017 1109   HCT 34.9 (L) 01/05/2019 1559   HCT 38.2 02/07/2017 1109   PLT 243 01/05/2019 1559   PLT 335 02/07/2017 1109   MCV 94.3 01/05/2019 1559   MCV 86 02/07/2017 1109   MCH 29.7 01/05/2019 1559   MCHC 31.5 01/05/2019 1559   RDW 13.4 01/05/2019 1559   RDW 15.1 02/07/2017 1109   LYMPHSABS 1.1 01/05/2019 1559   LYMPHSABS 3.0 02/07/2017 1109   MONOABS 0.9 01/05/2019 1559   EOSABS 0.3 01/05/2019 1559   EOSABS 1.1 (H) 02/07/2017 1109   BASOSABS 0.0 01/05/2019 1559   BASOSABS 0.0 02/07/2017 1109   CMP Latest Ref Rng & Units 01/05/2019 12/17/2018 12/03/2018  Glucose 70 - 99 mg/dL 79 108(H) 89  BUN 8 - 23 mg/dL '15 20 11  ' Creatinine 0.44 - 1.00 mg/dL 0.90 0.94 0.89  Sodium 135 - 145 mmol/L 136 132(L) 137  Potassium 3.5 - 5.1 mmol/L 4.2 3.6 3.7  Chloride 98 - 111 mmol/L 104 96(L) 110  CO2 22 - 32 mmol/L '22 24 22  ' Calcium 8.9 - 10.3 mg/dL 10.2 10.4(H) 8.3(L)  Total Protein 6.5 - 8.1 g/dL 7.7 7.7 -  Total Bilirubin 0.3 - 1.2 mg/dL 0.7 1.0 -  Alkaline Phos 38 - 126 U/L 59 69 -  AST 15 - 41 U/L 22 25 -  ALT 0 - 44 U/L 14 14 -       DIAGNOSTIC IMAGING:  I have independently reviewed the scans and discussed with the patient.   I have reviewed Francene Finders, NP's note and agree with the documentation.  I personally  performed a face-to-face visit,  made revisions and my assessment and plan is as follows.    ASSESSMENT & PLAN:   Breast cancer, right ([place]) 1.  Locally advanced (stage IIIa, T3N1) right breast cancer: - Mammogram on 03/28/2018 was BI-RADS Category 0.  Mammogram and ultrasound on 04/22/2018 shows 5.9 x 5 x 6 cm mass extending in the upper quadrant of the right breast and right axillary lymph node measuring 2.8 x 1.6 x 1.7 cm.  Left breast has retroareolar mass.  Patient did not have mammogram in the last 20 years. - Biopsy on 04/29/2018 of the right breast mass consistent with IDC, grade 2-3, lymph node biopsy positive for metastatic disease, ER/PR positive, HER-2 negative, Ki-67 of 5% -Seen by Dr. Arnoldo Morale who recommended right modified radical mastectomy and left simple mastectomy. - Bone scan showed  increased uptake in the right greater trochanter and right sacroiliac joint.  CT scan of the chest abdomen and pelvis did not show any evidence of metastatic disease.  The uptake in the right greater trochanter was thought to be due to enthesopathic change.  Uptake in the right sacroiliac joint was likely from degenerative changes at the right L5-S1 joint. - Due to advanced nature of her breast cancer, we have recommended neoadjuvant chemotherapy with dose dense AC followed by weekly paclitaxel for 12 weeks. - 4 cycles of dose dense AC from 06/05/2018 through 07/21/2018. - 12 cycles of weekly paclitaxel from 08/07/2018 through 10/23/2018.  Weekly paclitaxel started on 08/07/2018.  - Last physical examination revealed 4 to 5 cm mass in the right breast, softer, freely mobile.  Very small lymph node palpable in the right axilla.  Left breast has nonspecific mass in the center. -Anastrozole started on 11/07/2018. - Right modified radical mastectomy on 12/01/2018 with left simple mastectomy. 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 -We have ordered and reviewed receptor status.  ER was positive, PR was negative and HER-2 was negative.  Ki-67 was less than 1%. - She is tolerating anastrozole very well.  Her lower extremity strength has improved after physical therapy.  She is able to walk without walker. - I reviewed the results of the PET CT scan dated 01/05/2019 which did not show any recurrence or metastatic disease. - I have recommended physical exercise on a regular basis.  She will come back in 4 months for follow-up.  2.  Left breast DCIS: -Biopsy on 04/29/2018 of the left breast shows DCIS, ER/PR positive.  Left axillary lymph node biopsy was negative for malignancy. -Left simple mastectomy on 12/01/2018 shows 7 cm DCIS, intermediate grade, with cribriform, papillary and cystic morphology.  No evidence of invasion.  3.  Hypokalemia: -Potassium is 4.2.  She will continue potassium supplement.  4.  Osteopenia: - DEXA scan on 11/13/2018 shows T score of -0.8.   -She is taking calcium and vitamin D twice daily. -I have recommended Prolia every 6 months.  She has not seen a dentist recently.  She will see a dentist and a get clearance. -We talked about the side effects including rare chance of osteonecrosis of the jaw.       Orders placed this encounter:  Orders Placed This Encounter  Procedures  . CBC with Differential/Platelet  . Comprehensive metabolic panel      Derek Jack, MD [place] [phone]

## 2019-01-07 NOTE — Patient Instructions (Signed)
[place] at [facility] Discharge Instructions  Please have your dentist clear her so she can get Prolia injections for her bones.    Thank you for choosing [place] at [facility] to provide your oncology and hematology care.  To afford each patient quality time with our provider, please arrive at least 15 minutes before your scheduled appointment time.   If you have a lab appointment with the [place] please come in thru the  Main Entrance and check in at the main information desk  You need to re-schedule your appointment should you arrive 10 or more minutes late.  We strive to give you quality time with our providers, and arriving late affects you and other patients whose appointments are after yours.  Also, if you no show three or more times for appointments you may be dismissed from the clinic at the providers discretion.     Again, thank you for choosing [facility].  Our hope is that these requests will decrease the amount of time that you wait before being seen by our physicians.       _____________________________________________________________  Should you have questions after your visit to [facility], please contact our office at [phone] between the hours of 8:00 a.m. and 4:30 p.m.  Voicemails left after 4:00 p.m. will not be returned until the following business day.  For prescription refill requests, have your pharmacy contact our office and allow 72 hours.    Cancer Center Support Programs:   > Cancer Support Group  2nd Tuesday of the month 1pm-2pm, Journey Room

## 2019-01-08 DIAGNOSIS — M6281 Muscle weakness (generalized): Secondary | ICD-10-CM | POA: Diagnosis not present

## 2019-01-08 DIAGNOSIS — C50911 Malignant neoplasm of unspecified site of right female breast: Secondary | ICD-10-CM | POA: Diagnosis not present

## 2019-01-08 DIAGNOSIS — R262 Difficulty in walking, not elsewhere classified: Secondary | ICD-10-CM | POA: Diagnosis not present

## 2019-01-08 DIAGNOSIS — Z483 Aftercare following surgery for neoplasm: Secondary | ICD-10-CM | POA: Diagnosis not present

## 2019-01-08 DIAGNOSIS — Z9013 Acquired absence of bilateral breasts and nipples: Secondary | ICD-10-CM | POA: Diagnosis not present

## 2019-01-08 DIAGNOSIS — D0512 Intraductal carcinoma in situ of left breast: Secondary | ICD-10-CM | POA: Diagnosis not present

## 2019-01-09 ENCOUNTER — Ambulatory Visit (HOSPITAL_COMMUNITY): Payer: Medicare Other | Admitting: Hematology

## 2019-01-12 DIAGNOSIS — D0512 Intraductal carcinoma in situ of left breast: Secondary | ICD-10-CM | POA: Diagnosis not present

## 2019-01-12 DIAGNOSIS — Z9013 Acquired absence of bilateral breasts and nipples: Secondary | ICD-10-CM | POA: Diagnosis not present

## 2019-01-12 DIAGNOSIS — C50911 Malignant neoplasm of unspecified site of right female breast: Secondary | ICD-10-CM | POA: Diagnosis not present

## 2019-01-12 DIAGNOSIS — Z483 Aftercare following surgery for neoplasm: Secondary | ICD-10-CM | POA: Diagnosis not present

## 2019-01-12 DIAGNOSIS — R262 Difficulty in walking, not elsewhere classified: Secondary | ICD-10-CM | POA: Diagnosis not present

## 2019-01-12 DIAGNOSIS — M6281 Muscle weakness (generalized): Secondary | ICD-10-CM | POA: Diagnosis not present

## 2019-01-14 DIAGNOSIS — M6281 Muscle weakness (generalized): Secondary | ICD-10-CM | POA: Diagnosis not present

## 2019-01-14 DIAGNOSIS — C50911 Malignant neoplasm of unspecified site of right female breast: Secondary | ICD-10-CM | POA: Diagnosis not present

## 2019-01-14 DIAGNOSIS — R262 Difficulty in walking, not elsewhere classified: Secondary | ICD-10-CM | POA: Diagnosis not present

## 2019-01-14 DIAGNOSIS — Z9013 Acquired absence of bilateral breasts and nipples: Secondary | ICD-10-CM | POA: Diagnosis not present

## 2019-01-14 DIAGNOSIS — D0512 Intraductal carcinoma in situ of left breast: Secondary | ICD-10-CM | POA: Diagnosis not present

## 2019-01-14 DIAGNOSIS — Z483 Aftercare following surgery for neoplasm: Secondary | ICD-10-CM | POA: Diagnosis not present

## 2019-01-16 DIAGNOSIS — M6281 Muscle weakness (generalized): Secondary | ICD-10-CM | POA: Diagnosis not present

## 2019-01-16 DIAGNOSIS — Z9013 Acquired absence of bilateral breasts and nipples: Secondary | ICD-10-CM | POA: Diagnosis not present

## 2019-01-16 DIAGNOSIS — R262 Difficulty in walking, not elsewhere classified: Secondary | ICD-10-CM | POA: Diagnosis not present

## 2019-01-16 DIAGNOSIS — D0512 Intraductal carcinoma in situ of left breast: Secondary | ICD-10-CM | POA: Diagnosis not present

## 2019-01-16 DIAGNOSIS — C50911 Malignant neoplasm of unspecified site of right female breast: Secondary | ICD-10-CM | POA: Diagnosis not present

## 2019-01-16 DIAGNOSIS — Z483 Aftercare following surgery for neoplasm: Secondary | ICD-10-CM | POA: Diagnosis not present

## 2019-01-17 ENCOUNTER — Other Ambulatory Visit: Payer: Self-pay | Admitting: Family Medicine

## 2019-01-19 ENCOUNTER — Encounter: Payer: Self-pay | Admitting: Family Medicine

## 2019-01-19 ENCOUNTER — Ambulatory Visit (INDEPENDENT_AMBULATORY_CARE_PROVIDER_SITE_OTHER): Payer: Medicare Other | Admitting: Family Medicine

## 2019-01-19 VITALS — BP 102/78 | Ht 60.0 in | Wt 132.0 lb

## 2019-01-19 DIAGNOSIS — E119 Type 2 diabetes mellitus without complications: Secondary | ICD-10-CM | POA: Diagnosis not present

## 2019-01-19 DIAGNOSIS — Z9013 Acquired absence of bilateral breasts and nipples: Secondary | ICD-10-CM | POA: Diagnosis not present

## 2019-01-19 DIAGNOSIS — C50911 Malignant neoplasm of unspecified site of right female breast: Secondary | ICD-10-CM | POA: Diagnosis not present

## 2019-01-19 DIAGNOSIS — Z483 Aftercare following surgery for neoplasm: Secondary | ICD-10-CM | POA: Diagnosis not present

## 2019-01-19 DIAGNOSIS — M6281 Muscle weakness (generalized): Secondary | ICD-10-CM | POA: Diagnosis not present

## 2019-01-19 DIAGNOSIS — R262 Difficulty in walking, not elsewhere classified: Secondary | ICD-10-CM | POA: Diagnosis not present

## 2019-01-19 DIAGNOSIS — I10 Essential (primary) hypertension: Secondary | ICD-10-CM

## 2019-01-19 DIAGNOSIS — D0512 Intraductal carcinoma in situ of left breast: Secondary | ICD-10-CM | POA: Diagnosis not present

## 2019-01-19 DIAGNOSIS — R5383 Other fatigue: Secondary | ICD-10-CM

## 2019-01-19 DIAGNOSIS — J683 Other acute and subacute respiratory conditions due to chemicals, gases, fumes and vapors: Secondary | ICD-10-CM

## 2019-01-19 LAB — POCT GLYCOSYLATED HEMOGLOBIN (HGB A1C): HEMOGLOBIN A1C: 5.1 % (ref 4.0–5.6)

## 2019-01-19 MED ORDER — ALBUTEROL SULFATE (2.5 MG/3ML) 0.083% IN NEBU: INHALATION_SOLUTION | RESPIRATORY_TRACT | 2 refills | Status: DC

## 2019-01-19 MED ORDER — TRIAMCINOLONE ACETONIDE 0.1 % EX CREA: TOPICAL_CREAM | CUTANEOUS | 0 refills | Status: DC

## 2019-01-19 MED ORDER — ALBUTEROL SULFATE HFA 108 (90 BASE) MCG/ACT IN AERS: INHALATION_SPRAY | RESPIRATORY_TRACT | 5 refills | Status: DC

## 2019-01-19 MED ORDER — MONTELUKAST SODIUM 10 MG PO TABS: 10.0000 mg | ORAL_TABLET | Freq: Every day | ORAL | 1 refills | Status: DC

## 2019-01-19 NOTE — Progress Notes (Signed)
   Subjective:    Patient ID: Whitney Velez, female    DOB: [birth year], 69 y.o.   MRN: [id number] Patient arrives with numerous concerns HPI  Patient is here today to follow up on his chronic illnesses.   He has a history of Hypertension and was taking Norvasc 5 mg per day until her cancer Dr took her off of this.  Also has a history of diabetes and had been on Metformin 500 mg once per day. The cancer Dr. Also removed this from her regimen.  She does not have any concerns today. Results for orders placed or performed in visit on 01/19/19  POCT glycosylated hemoglobin (Hb A1C)  Result Value Ref Range   Hemoglobin A1C 5.1 4.0 - 5.6 %   HbA1c POC (<> result, manual entry)     HbA1c, POC (prediabetic range)     HbA1c, POC (controlled diabetic range)     Blood pressure medicine and blood pressure levels reviewed today with patient. Compliant with blood pressure medicine. States does not miss a dose. No obvious side effects. Blood pressure generally good when checked elsewhere. Watching salt intake.   Patient claims compliance with diabetes medication. No obvious side effects. Reports no substantial low sugar spells. Most numbers are generally in good range when checked fasting. Generally does not miss a dose of medication. Watching diabetic diet closely  Flu shot given  Energy overall getting better and better, the last wk has eaten quite a bit    Going thru phhys therapy, overall getting better , stil feeling unsteady but getting better    Had breast sureg for breast cancer along with chemotherapy.       Review of Systems No headache, no major weight loss or weight gain, no chest pain no back pain abdominal pain no change in bowel habits complete ROS otherwise negative     Objective:   Physical Exam Alert and oriented, vitals reviewed and stable, NAD ENT-TM's and ext canals WNL bilat via otoscopic exam Soft palate, tonsils and post pharynx WNL via oropharyngeal  exam Neck-symmetric, no masses; thyroid nonpalpable and nontender Pulmonary-no tachypnea or accessory muscle use; Clear without wheezes via auscultation Card--no abnrml murmurs, rhythm reg and rate WNL Carotid pulses symmetric, without bruits        Assessment & Plan:  Impression 1 hypertension.  Controlled now good off medications.  Likely due to weight loss discussed  2.  Type 2 diabetes.  A1c good.  Controlled now good off medications.  Once again due to weight loss  3.  Weight loss from breast cancer treatment.  Of note patient now clinically improved and starting to gain weight.  4.  Fatigue.  Slowly improving.  Discussed.  Exercise discussed and encouraged.  Appropriate nutrition discussed.  Follow-up in 6 months diet exercise discussed

## 2019-01-21 DIAGNOSIS — D0512 Intraductal carcinoma in situ of left breast: Secondary | ICD-10-CM | POA: Diagnosis not present

## 2019-01-21 DIAGNOSIS — M6281 Muscle weakness (generalized): Secondary | ICD-10-CM | POA: Diagnosis not present

## 2019-01-21 DIAGNOSIS — R262 Difficulty in walking, not elsewhere classified: Secondary | ICD-10-CM | POA: Diagnosis not present

## 2019-01-21 DIAGNOSIS — Z483 Aftercare following surgery for neoplasm: Secondary | ICD-10-CM | POA: Diagnosis not present

## 2019-01-21 DIAGNOSIS — Z9013 Acquired absence of bilateral breasts and nipples: Secondary | ICD-10-CM | POA: Diagnosis not present

## 2019-01-21 DIAGNOSIS — C50911 Malignant neoplasm of unspecified site of right female breast: Secondary | ICD-10-CM | POA: Diagnosis not present

## 2019-01-22 DIAGNOSIS — Z483 Aftercare following surgery for neoplasm: Secondary | ICD-10-CM | POA: Diagnosis not present

## 2019-01-22 DIAGNOSIS — C50911 Malignant neoplasm of unspecified site of right female breast: Secondary | ICD-10-CM | POA: Diagnosis not present

## 2019-01-22 DIAGNOSIS — M6281 Muscle weakness (generalized): Secondary | ICD-10-CM | POA: Diagnosis not present

## 2019-01-22 DIAGNOSIS — Z9013 Acquired absence of bilateral breasts and nipples: Secondary | ICD-10-CM | POA: Diagnosis not present

## 2019-01-22 DIAGNOSIS — D0512 Intraductal carcinoma in situ of left breast: Secondary | ICD-10-CM | POA: Diagnosis not present

## 2019-01-22 DIAGNOSIS — R262 Difficulty in walking, not elsewhere classified: Secondary | ICD-10-CM | POA: Diagnosis not present

## 2019-01-26 DIAGNOSIS — Z483 Aftercare following surgery for neoplasm: Secondary | ICD-10-CM | POA: Diagnosis not present

## 2019-01-26 DIAGNOSIS — C50911 Malignant neoplasm of unspecified site of right female breast: Secondary | ICD-10-CM | POA: Diagnosis not present

## 2019-01-26 DIAGNOSIS — M6281 Muscle weakness (generalized): Secondary | ICD-10-CM | POA: Diagnosis not present

## 2019-01-26 DIAGNOSIS — R262 Difficulty in walking, not elsewhere classified: Secondary | ICD-10-CM | POA: Diagnosis not present

## 2019-01-26 DIAGNOSIS — D0512 Intraductal carcinoma in situ of left breast: Secondary | ICD-10-CM | POA: Diagnosis not present

## 2019-01-26 DIAGNOSIS — Z9013 Acquired absence of bilateral breasts and nipples: Secondary | ICD-10-CM | POA: Diagnosis not present

## 2019-01-26 IMAGING — DX DG HIP (WITH OR WITHOUT PELVIS) 4+V*R*
3 series · 3 of 3 positions shown · non-contrast
Comparison: None

CLINICAL DATA: RIGHT shoulder pain radiating to RIGHT middle
finger, RIGHT knee pain posteriorly, RIGHT hip pain radiating below
knee, polyarthritis, onset of symptoms 2 weeks ago

EXAM:
DG HIP (WITH OR WITHOUT PELVIS) 4+V RIGHT

[pelvis ap (1 of 2)]
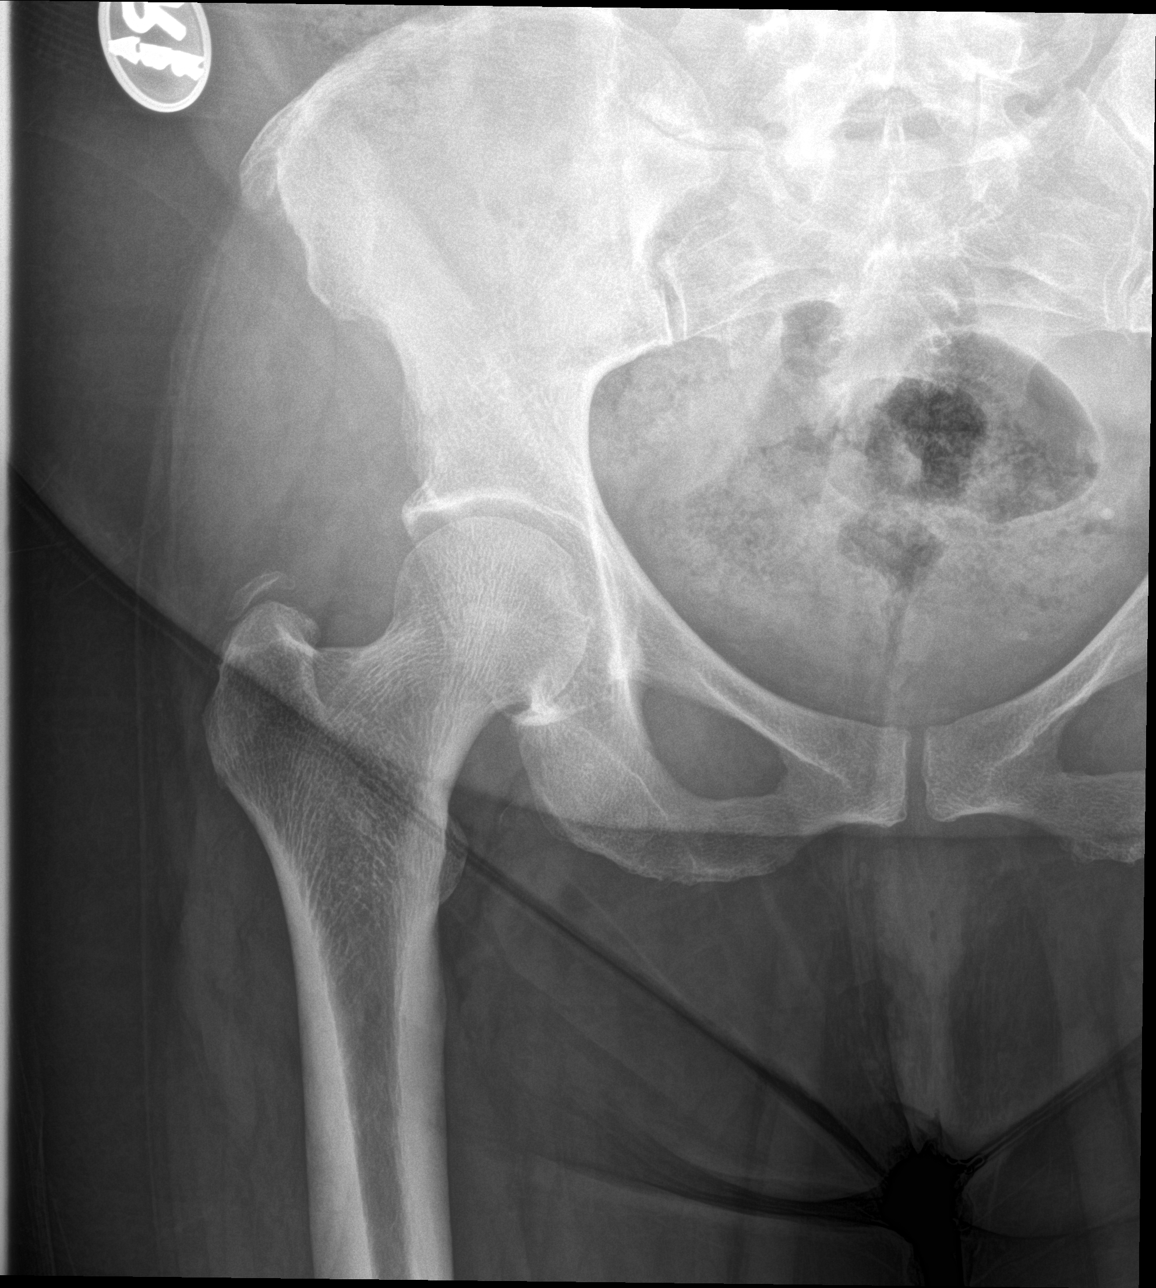

[hip lat]
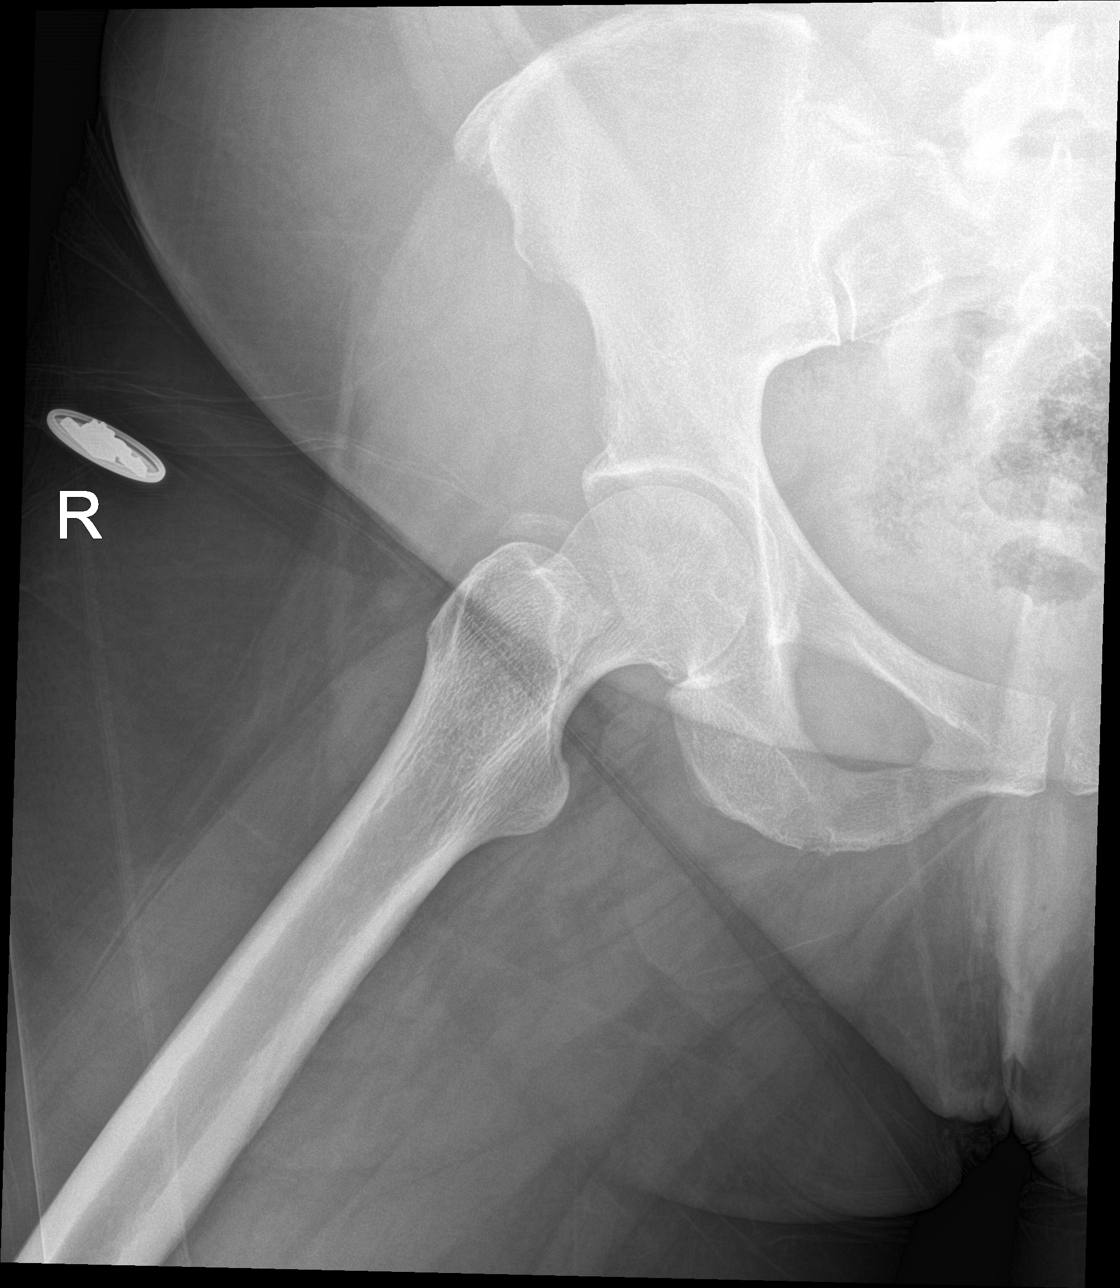

[pelvis ap (2 of 2)]
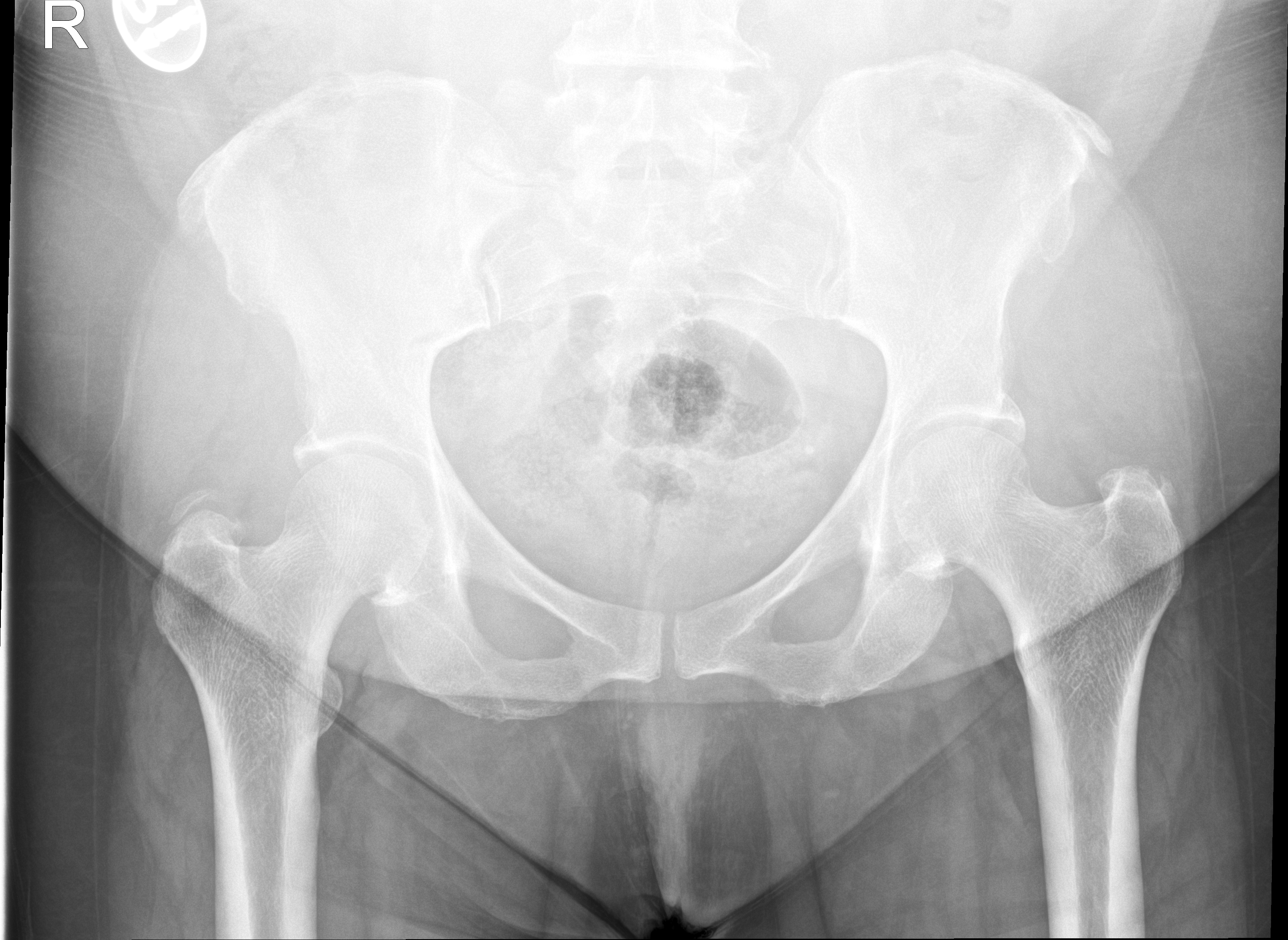

[3 of 3 positions shown; findings below may reference images not displayed]

FINDINGS: Mild osseous demineralization.

Hip and SI joint spaces preserved.

No acute fracture, dislocation, or bone destruction.

Small calcification adjacent to the greater trochanter appears
corticated and old.

Partial sacralization of the RIGHT transverse process of L5.
IMPRESSION: No acute osseous abnormalities.

## 2019-01-26 IMAGING — DX DG HAND COMPLETE 3+V*R*
3 series · 3 of 3 positions shown · non-contrast
Comparison: None

CLINICAL DATA: RIGHT shoulder pain radiating to RIGHT middle
finger, RIGHT knee pain posteriorly, RIGHT hip pain radiating below
knee, polyarthritis, onset of symptoms 2 weeks ago

EXAM:
RIGHT HAND - COMPLETE 3+ VIEW

[hand pa]
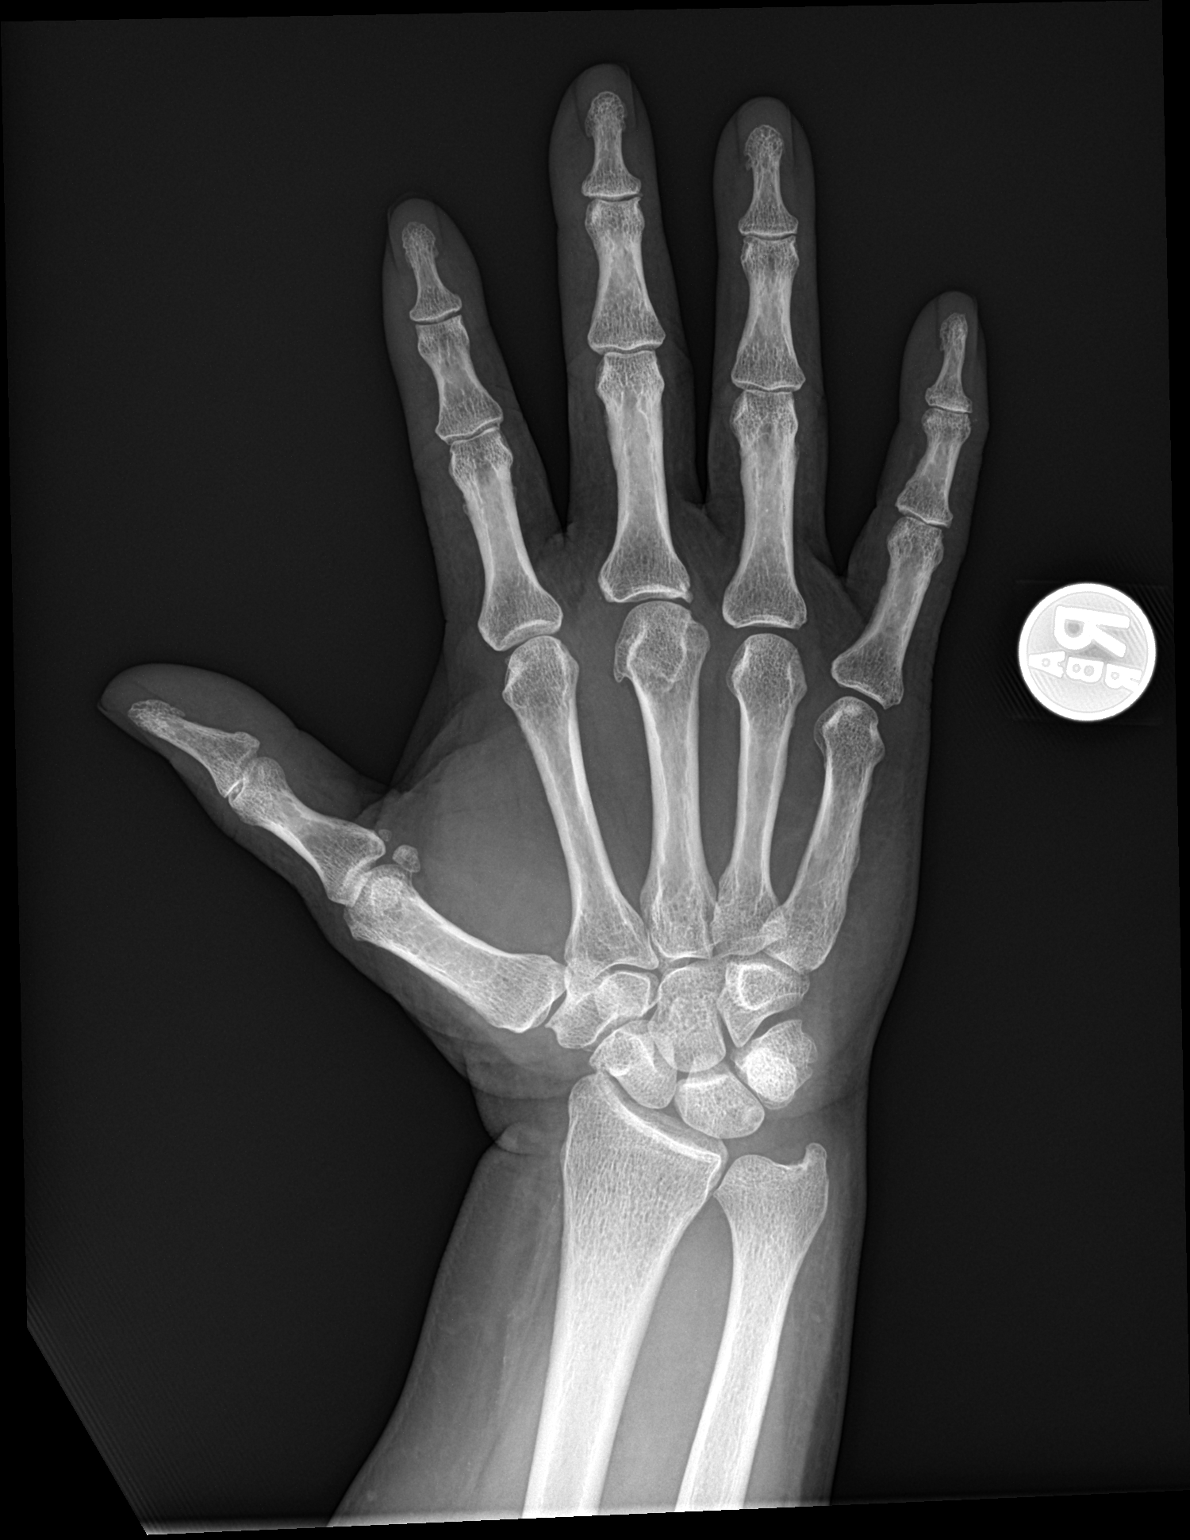

[hand obl]
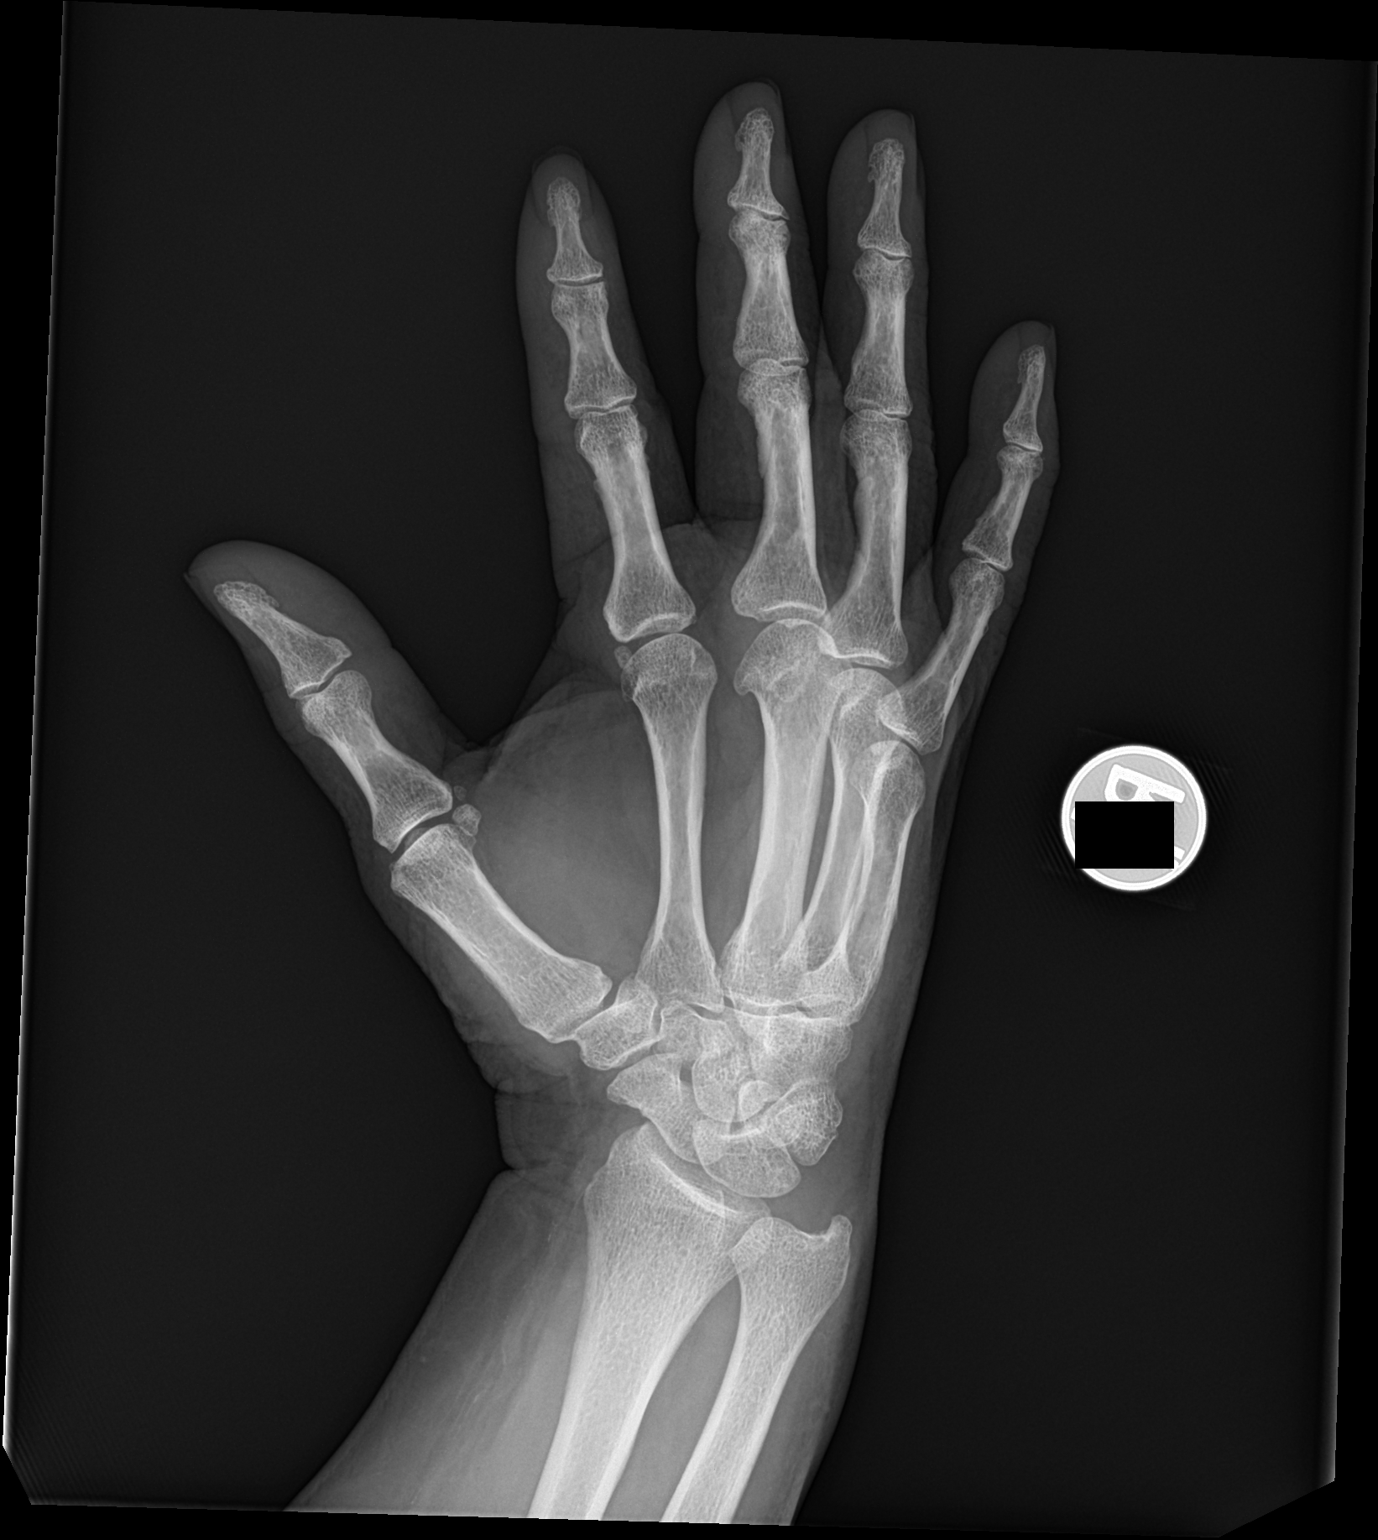

[hand lat]
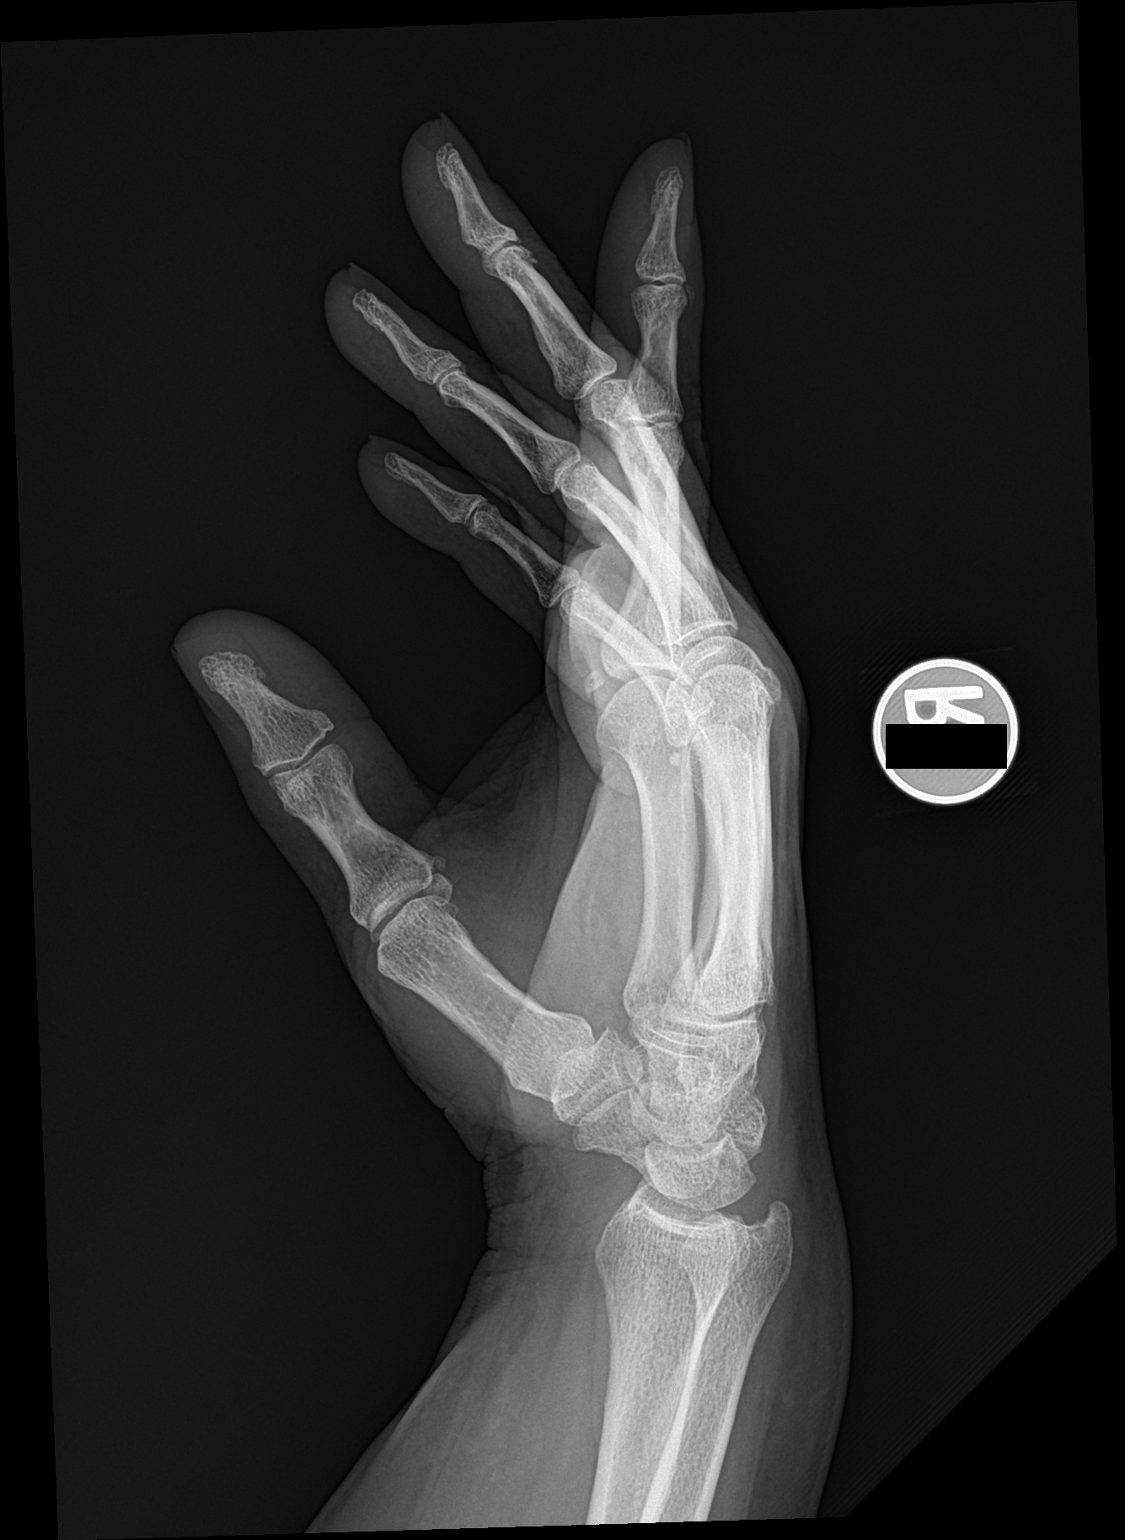

[3 of 3 positions shown; findings below may reference images not displayed]

FINDINGS: Osseous mineralization appears slightly decreased.

Mild degenerative changes at third MCP joint.

Minimal scattered degenerative changes at DIP joints RIGHT hand.

No acute fracture, dislocation or bone destruction.

No erosive or inflammatory changes identified.
IMPRESSION: Minimal degenerative changes greatest at third MCP joint.

No acute abnormalities.

## 2019-01-29 DIAGNOSIS — Z9013 Acquired absence of bilateral breasts and nipples: Secondary | ICD-10-CM | POA: Diagnosis not present

## 2019-01-29 DIAGNOSIS — Z483 Aftercare following surgery for neoplasm: Secondary | ICD-10-CM | POA: Diagnosis not present

## 2019-01-29 DIAGNOSIS — C50911 Malignant neoplasm of unspecified site of right female breast: Secondary | ICD-10-CM | POA: Diagnosis not present

## 2019-01-29 DIAGNOSIS — R262 Difficulty in walking, not elsewhere classified: Secondary | ICD-10-CM | POA: Diagnosis not present

## 2019-01-29 DIAGNOSIS — M6281 Muscle weakness (generalized): Secondary | ICD-10-CM | POA: Diagnosis not present

## 2019-01-29 DIAGNOSIS — D0512 Intraductal carcinoma in situ of left breast: Secondary | ICD-10-CM | POA: Diagnosis not present

## 2019-02-11 ENCOUNTER — Other Ambulatory Visit (HOSPITAL_COMMUNITY): Payer: Self-pay | Admitting: Nurse Practitioner

## 2019-02-25 ENCOUNTER — Other Ambulatory Visit (HOSPITAL_COMMUNITY): Payer: Self-pay | Admitting: *Deleted

## 2019-02-25 DIAGNOSIS — E876 Hypokalemia: Secondary | ICD-10-CM

## 2019-02-25 MED ORDER — POTASSIUM CHLORIDE CRYS ER 20 MEQ PO TBCR: 20.0000 meq | EXTENDED_RELEASE_TABLET | Freq: Two times a day (BID) | ORAL | 1 refills | Status: DC

## 2019-02-26 MED FILL — POTASSIUM CHLORIDE CRYS ER: 20 | 90 days supply | Qty: 180 | Fill #0

## 2019-02-27 ENCOUNTER — Other Ambulatory Visit (HOSPITAL_COMMUNITY): Payer: Self-pay | Admitting: Nurse Practitioner

## 2019-02-27 DIAGNOSIS — C50811 Malignant neoplasm of overlapping sites of right female breast: Secondary | ICD-10-CM

## 2019-02-27 DIAGNOSIS — Z170 Estrogen receptor positive status [ER+]: Principal | ICD-10-CM

## 2019-03-13 DIAGNOSIS — E1142 Type 2 diabetes mellitus with diabetic polyneuropathy: Secondary | ICD-10-CM | POA: Diagnosis not present

## 2019-03-13 DIAGNOSIS — B351 Tinea unguium: Secondary | ICD-10-CM | POA: Diagnosis not present

## 2019-05-01 ENCOUNTER — Other Ambulatory Visit: Payer: Self-pay | Admitting: Family Medicine

## 2019-05-01 ENCOUNTER — Other Ambulatory Visit: Payer: Self-pay

## 2019-05-01 ENCOUNTER — Inpatient Hospital Stay (HOSPITAL_COMMUNITY): Payer: Medicare Other | Attending: Hematology

## 2019-05-01 DIAGNOSIS — Z170 Estrogen receptor positive status [ER+]: Secondary | ICD-10-CM | POA: Diagnosis not present

## 2019-05-01 DIAGNOSIS — C50811 Malignant neoplasm of overlapping sites of right female breast: Secondary | ICD-10-CM | POA: Insufficient documentation

## 2019-05-01 DIAGNOSIS — M8580 Other specified disorders of bone density and structure, unspecified site: Secondary | ICD-10-CM | POA: Diagnosis not present

## 2019-05-01 DIAGNOSIS — Z9013 Acquired absence of bilateral breasts and nipples: Secondary | ICD-10-CM | POA: Insufficient documentation

## 2019-05-01 DIAGNOSIS — C773 Secondary and unspecified malignant neoplasm of axilla and upper limb lymph nodes: Secondary | ICD-10-CM | POA: Insufficient documentation

## 2019-05-01 DIAGNOSIS — E876 Hypokalemia: Secondary | ICD-10-CM | POA: Insufficient documentation

## 2019-05-01 LAB — CBC WITH DIFFERENTIAL/PLATELET
Abs Immature Granulocytes: 0.03 10*3/uL (ref 0.00–0.07)
Basophils Absolute: 0.1 10*3/uL (ref 0.0–0.1)
Basophils Relative: 1 %
Eosinophils Absolute: 1.6 10*3/uL — ABNORMAL HIGH (ref 0.0–0.5)
Eosinophils Relative: 22 %
HCT: 36.2 % (ref 36.0–46.0)
Hemoglobin: 12 g/dL (ref 12.0–15.0)
Immature Granulocytes: 0 %
Lymphocytes Relative: 18 %
Lymphs Abs: 1.3 10*3/uL (ref 0.7–4.0)
MCH: 29.8 pg (ref 26.0–34.0)
MCHC: 33.1 g/dL (ref 30.0–36.0)
MCV: 89.8 fL (ref 80.0–100.0)
Monocytes Absolute: 0.7 10*3/uL (ref 0.1–1.0)
Monocytes Relative: 10 %
Neutro Abs: 3.5 10*3/uL (ref 1.7–7.7)
Neutrophils Relative %: 49 %
Platelets: 229 10*3/uL (ref 150–400)
RBC: 4.03 MIL/uL (ref 3.87–5.11)
RDW: 15.6 % — ABNORMAL HIGH (ref 11.5–15.5)
WBC: 7.1 10*3/uL (ref 4.0–10.5)
nRBC: 0 % (ref 0.0–0.2)

## 2019-05-01 LAB — COMPREHENSIVE METABOLIC PANEL
ALT: 11 U/L (ref 0–44)
AST: 21 U/L (ref 15–41)
Albumin: 4 g/dL (ref 3.5–5.0)
Alkaline Phosphatase: 82 U/L (ref 38–126)
Anion gap: 9 (ref 5–15)
BUN: 12 mg/dL (ref 8–23)
CO2: 26 mmol/L (ref 22–32)
Calcium: 9.6 mg/dL (ref 8.9–10.3)
Chloride: 102 mmol/L (ref 98–111)
Creatinine, Ser: 0.91 mg/dL (ref 0.44–1.00)
GFR calc Af Amer: >60 mL/min (ref >60)
GFR calc non Af Amer: >60 mL/min (ref >60)
Glucose, Bld: 101 mg/dL — ABNORMAL HIGH (ref 70–99)
Potassium: 3.6 mmol/L (ref 3.5–5.1)
Sodium: 137 mmol/L (ref 135–145)
Total Bilirubin: 0.5 mg/dL (ref 0.3–1.2)
Total Protein: 7.5 g/dL (ref 6.5–8.1)

## 2019-05-01 NOTE — Telephone Encounter (Signed)
Six mo worth 

## 2019-05-01 NOTE — Telephone Encounter (Signed)
Contacted patient daughter and pt daughter states she is taking Amlodiopine 5 mg once daily. Please advise. Thank you

## 2019-05-08 ENCOUNTER — Other Ambulatory Visit: Payer: Self-pay

## 2019-05-08 ENCOUNTER — Inpatient Hospital Stay (HOSPITAL_BASED_OUTPATIENT_CLINIC_OR_DEPARTMENT_OTHER): Payer: Medicare Other | Admitting: Nurse Practitioner

## 2019-05-08 VITALS — BP 143/61 | HR 63 | Temp 98.1°F | Resp 18 | Wt 136.9 lb

## 2019-05-08 DIAGNOSIS — E876 Hypokalemia: Secondary | ICD-10-CM | POA: Diagnosis not present

## 2019-05-08 DIAGNOSIS — C50811 Malignant neoplasm of overlapping sites of right female breast: Secondary | ICD-10-CM

## 2019-05-08 DIAGNOSIS — M8580 Other specified disorders of bone density and structure, unspecified site: Secondary | ICD-10-CM | POA: Insufficient documentation

## 2019-05-08 DIAGNOSIS — C773 Secondary and unspecified malignant neoplasm of axilla and upper limb lymph nodes: Secondary | ICD-10-CM | POA: Diagnosis not present

## 2019-05-08 DIAGNOSIS — Z9013 Acquired absence of bilateral breasts and nipples: Secondary | ICD-10-CM

## 2019-05-08 DIAGNOSIS — D0512 Intraductal carcinoma in situ of left breast: Secondary | ICD-10-CM | POA: Diagnosis not present

## 2019-05-08 DIAGNOSIS — Z170 Estrogen receptor positive status [ER+]: Secondary | ICD-10-CM

## 2019-05-08 NOTE — Patient Instructions (Signed)
[facility] at [facility] Discharge Instructions     Thank you for choosing [facility] at [facility] to provide your oncology and hematology care.  To afford each patient quality time with our provider, please arrive at least 15 minutes before your scheduled appointment time.   If you have a lab appointment with the Cancer Center please come in thru the  Main Entrance and check in at the main information desk  You need to re-schedule your appointment should you arrive 10 or more minutes late.  We strive to give you quality time with our providers, and arriving late affects you and other patients whose appointments are after yours.  Also, if you no show three or more times for appointments you may be dismissed from the clinic at the providers discretion.     Again, thank you for choosing [facility].  Our hope is that these requests will decrease the amount of time that you wait before being seen by our physicians.       _____________________________________________________________  Should you have questions after your visit to [facility], please contact our office at [phone] between the hours of 8:00 a.m. and 4:30 p.m.  Voicemails left after 4:00 p.m. will not be returned until the following business day.  For prescription refill requests, have your pharmacy contact our office and allow 72 hours.    Cancer Center Support Programs:   > Cancer Support Group  2nd Tuesday of the month 1pm-2pm, Journey Room    

## 2019-05-08 NOTE — Progress Notes (Signed)
Whitney Velez, [place] [id number]   CLINIC:  Medical Oncology/Hematology  PCP:  Whitney Velez, [place] [address] [phone]  REASON FOR VISIT: Follow-up for breast cancer  CURRENT THERAPY: Anastrozole  BRIEF ONCOLOGIC HISTORY:    Breast cancer, right ([place])   05/12/2018 Initial Diagnosis    Breast cancer, right ([place])    06/02/2018 -  Chemotherapy    The patient had DOXOrubicin (ADRIAMYCIN) chemo injection 110 mg, 60 mg/m2 = 110 mg, Intravenous,  Once, 4 of 4 cycles Administration: 110 mg (06/05/2018), 110 mg (06/19/2018), 110 mg (07/07/2018), 110 mg (07/21/2018) palonosetron (ALOXI) injection 0.25 mg, 0.25 mg, Intravenous,  Once, 4 of 4 cycles Administration: 0.25 mg (06/05/2018), 0.25 mg (06/19/2018), 0.25 mg (07/07/2018), 0.25 mg (07/21/2018) pegfilgrastim-cbqv (UDENYCA) injection 6 mg, 6 mg, Subcutaneous, Once, 4 of 4 cycles Administration: 6 mg (06/06/2018), 6 mg (06/20/2018), 6 mg (07/08/2018), 6 mg (07/22/2018) cyclophosphamide (CYTOXAN) 1,100 mg in sodium chloride 0.9 % 250 mL chemo infusion, 600 mg/m2 = 1,100 mg, Intravenous,  Once, 4 of 4 cycles Administration: 1,100 mg (06/05/2018), 1,100 mg (06/19/2018), 1,100 mg (07/07/2018), 1,100 mg (07/21/2018) PACLitaxel (TAXOL) 144 mg in sodium chloride 0.9 % 250 mL chemo infusion (</= '80mg'$ /m2), 80 mg/m2 = 144 mg, Intravenous,  Once, 12 of 12 cycles Administration: 144 mg (08/07/2018), 144 mg (08/14/2018), 144 mg (08/21/2018), 144 mg (08/28/2018), 144 mg (09/04/2018), 144 mg (09/11/2018), 144 mg (09/18/2018), 144 mg (09/25/2018), 144 mg (10/02/2018), 144 mg (10/09/2018), 144 mg (10/17/2018), 144 mg (10/24/2018) fosaprepitant (EMEND) 150 mg, dexamethasone (DECADRON) 12 mg in sodium chloride 0.9 % 145 mL IVPB, , Intravenous,  Once, 4 of 4 cycles Administration:  (06/05/2018),  (06/19/2018),  (07/07/2018),  (07/21/2018)  for chemotherapy treatment.       INTERVAL HISTORY:  Whitney Velez 69 y.o. female  returns for routine follow-up for breast cancer.  She reports she is taking her anastrozole daily.  She reports she gets a little hot for the first few hours after taking it however she does not have flashes nor does it bother her the rest of the day.  She reports she has some numbness and tingling that is stable from her treatment.  Otherwise she is doing well and has no complaints at this time. Denies any nausea, vomiting, or diarrhea. Denies any new pains. Had not noticed any recent bleeding such as epistaxis, hematuria or hematochezia. Denies recent chest pain on exertion, shortness of breath on minimal exertion, pre-syncopal episodes, or palpitations. Denies any numbness or tingling in hands or feet. Denies any recent fevers, infections, or recent hospitalizations. Patient reports appetite at 100% and energy level at 100%.  She is eating well and maintaining her weight at this time.    REVIEW OF SYSTEMS:  Review of Systems  Neurological: Positive for numbness.  All other systems reviewed and are negative.    PAST MEDICAL/SURGICAL HISTORY:  Past Medical History:  Diagnosis Date  . Asthma   . Hypertension    Past Surgical History:  Procedure Laterality Date  . ABDOMINAL HYSTERECTOMY    . MASTECTOMY MODIFIED RADICAL Right 12/01/2018   Procedure: RIGHT MODIFIED RADICAL MASTECTOMY (Procedure #1);  Surgeon: Aviva Signs, MD;  Location: [facility];  Service: General;  Laterality: Right;  . PORTACATH PLACEMENT Left 06/04/2018   Procedure: INSERTION PORT-A-CATH;  Surgeon: Aviva Signs, MD;  Location: [facility];  Service: General;  Laterality: Left;  . SIMPLE MASTECTOMY WITH AXILLARY SENTINEL NODE BIOPSY Left 12/01/2018  Procedure: LEFT SIMPLE MASTECTOMY (Procedure #2);  Surgeon: Aviva Signs, MD;  Location: [facility];  Service: General;  Laterality: Left;     SOCIAL HISTORY:  Social History   Socioeconomic History  . Marital status: Widowed    Spouse name: Not on file  . Number of children: Not  on file  . Years of education: Not on file  . Highest education level: Not on file  Occupational History  . Not on file  Social Needs  . Financial resource strain: Not on file  . Food insecurity:    Worry: Not on file    Inability: Not on file  . Transportation needs:    Medical: Not on file    Non-medical: Not on file  Tobacco Use  . Smoking status: Never Smoker  . Smokeless tobacco: Never Used  Substance and Sexual Activity  . Alcohol use: Never    Frequency: Never  . Drug use: Never  . Sexual activity: Not on file  Lifestyle  . Physical activity:    Days per week: Not on file    Minutes per session: Not on file  . Stress: Not on file  Relationships  . Social connections:    Talks on phone: Not on file    Gets together: Not on file    Attends religious service: Not on file    Active member of club or organization: Not on file    Attends meetings of clubs or organizations: Not on file    Relationship status: Not on file  . Intimate partner violence:    Fear of current or ex partner: Not on file    Emotionally abused: Not on file    Physically abused: Not on file    Forced sexual activity: Not on file  Other Topics Concern  . Not on file  Social History Narrative  . Not on file    FAMILY HISTORY:  No family history on file.  CURRENT MEDICATIONS:  Outpatient Encounter Medications as of 05/08/2019  Medication Sig  . albuterol (PROAIR HFA) 108 (90 Base) MCG/ACT inhaler INHALE TWO PUFFS INTO THE LUNGS EVERY SIX HOURS AS NEEDED FOR WHEEZING  . albuterol (PROVENTIL) (2.5 MG/3ML) 0.083% nebulizer solution Use via neb q 4 hrs prn wheezing  . amLODipine (NORVASC) 5 MG tablet TAKE ONE (1) TABLET BY MOUTH EVERY DAY  . anastrozole (ARIMIDEX) 1 MG tablet TAKE ONE TABLET ('1MG'$  TOTAL) BY MOUTH DAILY  . aspirin 81 MG tablet Take 81 mg by mouth daily.  . Calcium Carb-Cholecalciferol (CALCIUM + VITAMIN D3 PO) Take 1 tablet by mouth 2 (two) times daily.  . Flaxseed, Linseed,  (FLAXSEED OIL) 1000 MG CAPS Take 1,000 mg by mouth daily.   Marland Kitchen lactulose (CHRONULAC) 10 GM/15ML solution Take 30 mLs (20 g total) by mouth daily.  . montelukast (SINGULAIR) 10 MG tablet Take 1 tablet (10 mg total) by mouth at bedtime.  . potassium chloride SA (K-DUR,KLOR-CON) 20 MEQ tablet Take 1 tablet (20 mEq total) by mouth 2 (two) times daily.  . traMADol (ULTRAM) 50 MG tablet Take 1 tablet (50 mg total) by mouth every 6 (six) hours as needed.  . triamcinolone cream (KENALOG) 0.1 % Apply twice daily to affected areas.  . lidocaine-prilocaine (EMLA) cream Apply to affected area once (Patient not taking: Reported on 05/08/2019)   No facility-administered encounter medications on file as of 05/08/2019.     ALLERGIES:  Allergies  Allergen Reactions  . Penicillins Other (See Comments)    Nausea, vomiting,  dizziness Has patient had a PCN reaction causing immediate rash, facial/tongue/throat swelling, SOB or lightheadedness with hypotension: no Has patient had a PCN reaction causing severe rash involving mucus membranes or skin necrosis: no Has patient had a PCN reaction that required hospitalization: no Has patient had a PCN reaction occurring within the last 10 years: no If all of the above answers are "NO", then may proceed with Cephalosporin use.   Marland Kitchen Percocet [Oxycodone-Acetaminophen] Diarrhea and Nausea And Vomiting  . Ranitidine Itching     PHYSICAL EXAM:  ECOG Performance status: 1  Vitals:   05/08/19 1052  BP: (!) 143/61  Pulse: 63  Resp: 18  Temp: 98.1 F (36.7 C)  SpO2: 99%   Filed Weights   05/08/19 1052  Weight: 136 lb 14.4 oz (62.1 kg)    Physical Exam Constitutional:      Appearance: Normal appearance. She is normal weight.  Cardiovascular:     Rate and Rhythm: Normal rate and regular rhythm.     Heart sounds: Normal heart sounds.  Pulmonary:     Effort: Pulmonary effort is normal.     Breath sounds: Normal breath sounds.  Abdominal:     General: Bowel  sounds are normal.     Palpations: Abdomen is soft.  Musculoskeletal: Normal range of motion.  Skin:    General: Skin is warm and dry.  Neurological:     Mental Status: She is alert and oriented to person, place, and time. Mental status is at baseline.  Psychiatric:        Mood and Affect: Mood normal.        Behavior: Behavior normal.        Thought Content: Thought content normal.        Judgment: Judgment normal.   Breast: Bilateral mastectomy sites healed.  No adenopathy.  No redness or edema.  No new lumps or masses felt.  LABORATORY DATA:  I have reviewed the labs as listed.  CBC    Component Value Date/Time   WBC 7.1 05/01/2019 1106   RBC 4.03 05/01/2019 1106   HGB 12.0 05/01/2019 1106   HGB 12.4 02/07/2017 1109   HCT 36.2 05/01/2019 1106   HCT 38.2 02/07/2017 1109   PLT 229 05/01/2019 1106   PLT 335 02/07/2017 1109   MCV 89.8 05/01/2019 1106   MCV 86 02/07/2017 1109   MCH 29.8 05/01/2019 1106   MCHC 33.1 05/01/2019 1106   RDW 15.6 (H) 05/01/2019 1106   RDW 15.1 02/07/2017 1109   LYMPHSABS 1.3 05/01/2019 1106   LYMPHSABS 3.0 02/07/2017 1109   MONOABS 0.7 05/01/2019 1106   EOSABS 1.6 (H) 05/01/2019 1106   EOSABS 1.1 (H) 02/07/2017 1109   BASOSABS 0.1 05/01/2019 1106   BASOSABS 0.0 02/07/2017 1109   CMP Latest Ref Rng & Units 05/01/2019 01/05/2019 12/17/2018  Glucose 70 - 99 mg/dL 101(H) 79 108(H)  BUN 8 - 23 mg/dL '12 15 20  '$ Creatinine 0.44 - 1.00 mg/dL 0.91 0.90 0.94  Sodium 135 - 145 mmol/L 137 136 132(L)  Potassium 3.5 - 5.1 mmol/L 3.6 4.2 3.6  Chloride 98 - 111 mmol/L 102 104 96(L)  CO2 22 - 32 mmol/L '26 22 24  '$ Calcium 8.9 - 10.3 mg/dL 9.6 10.2 10.4(H)  Total Protein 6.5 - 8.1 g/dL 7.5 7.7 7.7  Total Bilirubin 0.3 - 1.2 mg/dL 0.5 0.7 1.0  Alkaline Phos 38 - 126 U/L 82 59 69  AST 15 - 41 U/L '21 22 25  '$ ALT 0 -  44 U/L '11 14 14     '$ I personally performed a face-to-face visit.  All questions were answered to patient's stated satisfaction. Encouraged  patient to call with any new concerns or questions before his next visit to the cancer center and we can certain see him sooner, if needed.     ASSESSMENT & PLAN:   Breast cancer, right ([place]) 1.  Locally advanced right breast cancer, stage IIIa: - She had a mammogram on 03/28/2018's that was B RADS category 0. - She had a diagnostic mammogram and ultrasound on 04/22/2018 that showed a 5.9 x 5 x 6 cm mass extending in the upper right quadrant of the right breast and right axillary lymph nodes measuring 2.8 x 1.6 x 1.7 cm.  Left breast has retroareolar mass. - Patient did not have mammogram in the last 20 years. - Biopsy on 04/29/2018 of the right breast mass was consistent with IDC, grade 2-3, lymph node biopsy positive for metastatic disease, ER/PR positive, HER-2 negative, Ki-67 of 5%. - She was seen by Dr. Arnoldo Morale who recommended right modified radical mastectomy and left simple mastectomy. - Bone scan showed increased uptake in the right greater trochanter and right sacroiliac joint. - CT scan of the chest abdomen pelvis did not show any evidence of metastatic disease.  The uptake in the right greater trochanter was thought to be due to osteoporotic changes.  Uptake in the right sacroiliac joint was likely due from degenerative changes at the right L5-S1 joint. - Neoadjuvant chemotherapy with dose dense AC followed by weekly paclitaxel for 12 weeks. - 4 cycles of dose dense AC from 06/05/2018 through 07/21/2018. -12 cycles of weekly paclitaxel from 08/07/2018 through 10/23/2018. -Anastrozole was started on 11/07/2018 -Right modified radical mastectomy on 12/01/2018 with left simple mastectomy. 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 - Receptor markers were ordered and ER was positive, PR was  negative, and HER-2 was negative.  Ki 67 was less than 1%. - She is tolerating anastrozole very well.  Her lower extremity strength has improved after physical therapy and she is able to walk without a walker now. -She had a PET CT scan on 01/05/2019 which did not show any recurrent or metastatic disease. -She will follow-up in 4 months with repeat labs.  2.  Left breast DCIS: -Biopsy on 04/29/2018 of the left breast showed DCIS, ER/PR positive.  Left axillary lymph node biopsy was negative for malignancy. -Left simple mastectomy on 12/01/2018 showed 7 mm DCIS, intermediate grade, with cribriform, papillary and cystic morphology.  No evidence of invasion.  3.  Hypokalemia: - Labs on 05/08/2019 showed her potassium 3.6. -She will continue taking her potassium supplements at this time.  4.  Osteopenia: - Last DEXA scan on 11/13/2018 showed T score of -0.8. -She is taking calcium and vitamin D twice daily. -I have recommended Prolia every 6 months. -She has recently been cleared by the dentist and had teeth pulled. - We will start this on her follow-up visit.       Orders placed this encounter:  Orders Placed This Encounter  Procedures  . Lactate dehydrogenase  . CBC with Differential/Platelet  . Comprehensive metabolic panel  . Ferritin  . Iron and TIBC  . Vitamin B12  . VITAMIN D 25 Hydroxy (Vit-D Deficiency, Fractures)  . Folate  Francene Finders, FNP-C [place] [phone]

## 2019-05-08 NOTE — Assessment & Plan Note (Signed)
1.  Locally advanced right breast cancer, stage IIIa: - She had a mammogram on 03/28/2018's that was B RADS category 0. - She had a diagnostic mammogram and ultrasound on 04/22/2018 that showed a 5.9 x 5 x 6 cm mass extending in the upper right quadrant of the right breast and right axillary lymph nodes measuring 2.8 x 1.6 x 1.7 cm.  Left breast has retroareolar mass. - Patient did not have mammogram in the last 20 years. - Biopsy on 04/29/2018 of the right breast mass was consistent with IDC, grade 2-3, lymph node biopsy positive for metastatic disease, ER/PR positive, HER-2 negative, Ki-67 of 5%. - She was seen by Dr. Arnoldo Morale who recommended right modified radical mastectomy and left simple mastectomy. - Bone scan showed increased uptake in the right greater trochanter and right sacroiliac joint. - CT scan of the chest abdomen pelvis did not show any evidence of metastatic disease.  The uptake in the right greater trochanter was thought to be due to osteoporotic changes.  Uptake in the right sacroiliac joint was likely due from degenerative changes at the right L5-S1 joint. - Neoadjuvant chemotherapy with dose dense AC followed by weekly paclitaxel for 12 weeks. - 4 cycles of dose dense AC from 06/05/2018 through 07/21/2018. -12 cycles of weekly paclitaxel from 08/07/2018 through 10/23/2018. -Anastrozole was started on 11/07/2018 -Right modified radical mastectomy on 12/01/2018 with left simple mastectomy. 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 - Receptor markers were ordered and ER was positive, PR was negative, and HER-2 was negative.  Ki 67 was less than 1%. - She is tolerating anastrozole very well.  Her lower extremity strength has improved after physical therapy and she is able to walk  without a walker now. -She had a PET CT scan on 01/05/2019 which did not show any recurrent or metastatic disease. -She will follow-up in 4 months with repeat labs.  2.  Left breast DCIS: -Biopsy on 04/29/2018 of the left breast showed DCIS, ER/PR positive.  Left axillary lymph node biopsy was negative for malignancy. -Left simple mastectomy on 12/01/2018 showed 7 mm DCIS, intermediate grade, with cribriform, papillary and cystic morphology.  No evidence of invasion.  3.  Hypokalemia: - Labs on 05/08/2019 showed her potassium 3.6. -She will continue taking her potassium supplements at this time.  4.  Osteopenia: - Last DEXA scan on 11/13/2018 showed T score of -0.8. -She is taking calcium and vitamin D twice daily. -I have recommended Prolia every 6 months. -She has recently been cleared by the dentist and had teeth pulled. - We will start this on her follow-up visit.

## 2019-05-29 ENCOUNTER — Other Ambulatory Visit (HOSPITAL_COMMUNITY): Payer: Self-pay | Admitting: *Deleted

## 2019-05-29 DIAGNOSIS — E876 Hypokalemia: Secondary | ICD-10-CM

## 2019-05-29 MED ORDER — POTASSIUM CHLORIDE CRYS ER 20 MEQ PO TBCR: 20.0000 meq | EXTENDED_RELEASE_TABLET | Freq: Two times a day (BID) | ORAL | 1 refills | Status: DC

## 2019-06-02 ENCOUNTER — Telehealth (HOSPITAL_COMMUNITY): Payer: Self-pay | Admitting: Hematology

## 2019-06-02 ENCOUNTER — Other Ambulatory Visit (HOSPITAL_COMMUNITY): Payer: Self-pay | Admitting: *Deleted

## 2019-06-02 DIAGNOSIS — E876 Hypokalemia: Secondary | ICD-10-CM

## 2019-06-02 MED ORDER — POTASSIUM CHLORIDE CRYS ER 20 MEQ PO TBCR: 20.0000 meq | EXTENDED_RELEASE_TABLET | Freq: Two times a day (BID) | ORAL | 1 refills | Status: DC

## 2019-06-03 MED FILL — POTASSIUM CHLORIDE CRYS ER: 20 | 90 days supply | Qty: 180 | Fill #0

## 2019-06-09 DIAGNOSIS — B351 Tinea unguium: Secondary | ICD-10-CM | POA: Diagnosis not present

## 2019-06-09 DIAGNOSIS — E1142 Type 2 diabetes mellitus with diabetic polyneuropathy: Secondary | ICD-10-CM | POA: Diagnosis not present

## 2019-07-06 ENCOUNTER — Other Ambulatory Visit (HOSPITAL_COMMUNITY): Payer: Self-pay | Admitting: Nurse Practitioner

## 2019-07-06 DIAGNOSIS — C50811 Malignant neoplasm of overlapping sites of right female breast: Secondary | ICD-10-CM

## 2019-07-14 ENCOUNTER — Other Ambulatory Visit: Payer: Self-pay

## 2019-07-14 ENCOUNTER — Other Ambulatory Visit: Payer: Self-pay | Admitting: Family Medicine

## 2019-07-16 ENCOUNTER — Other Ambulatory Visit: Payer: Self-pay

## 2019-07-16 ENCOUNTER — Ambulatory Visit (INDEPENDENT_AMBULATORY_CARE_PROVIDER_SITE_OTHER): Payer: Medicare Other | Admitting: Family Medicine

## 2019-07-16 DIAGNOSIS — I10 Essential (primary) hypertension: Secondary | ICD-10-CM | POA: Diagnosis not present

## 2019-07-16 DIAGNOSIS — J683 Other acute and subacute respiratory conditions due to chemicals, gases, fumes and vapors: Secondary | ICD-10-CM

## 2019-07-16 DIAGNOSIS — E119 Type 2 diabetes mellitus without complications: Secondary | ICD-10-CM

## 2019-07-16 MED ORDER — AMLODIPINE BESYLATE 5 MG PO TABS: ORAL_TABLET | ORAL | 1 refills | Status: DC

## 2019-07-16 NOTE — Progress Notes (Signed)
   Subjective:  Audio only  Patient ID: Whitney Velez, female    DOB: 1950/04/08, 69 y.o.   MRN: [id number]  Diabetes She presents for her follow-up diabetic visit. She has type 2 diabetes mellitus. There are no hypoglycemic associated symptoms. There are no diabetic associated symptoms. There are no hypoglycemic complications. There are no diabetic complications. She sees a podiatrist.Eye exam is not current.  Hypertension This is a chronic problem. Risk factors for coronary artery disease include diabetes mellitus. There are no compliance problems.    Pt states she checks her blood pressure each morning and her sugars every other day.   Virtual Visit via Video Note  I connected with Whitney Velez on 07/16/19 at  2:00 PM EDT by a video enabled telemedicine application and verified that I am speaking with the correct person using two identifiers.  Location: Patient: home Provider: office   I discussed the limitations of evaluation and management by telemedicine and the availability of in person appointments. The patient expressed understanding and agreed to proceed.  History of Present Illness:    Observations/Objective:   Assessment and Plan:   Follow Up Instructions:    I discussed the assessment and treatment plan with the patient. The patient was provided an opportunity to ask questions and all were answered. The patient agreed with the plan and demonstrated an understanding of the instructions.   The patient was advised to call back or seek an in-person evaluation if the symptoms worsen or if the condition fails to improve as anticipated.  I provided25 minutes of non-face-to-face time during this encounter.   Vicente Males, LPN  Blood pressure medicine and blood pressure levels reviewed today with patient. Compliant with blood pressure medicine. States does not miss a dose. No obvious side effects. Blood pressure generally good when checked elsewhere. Watching salt  intake.   Patient claims compliance with diabetes medication. No obvious side effects. Reports no substantial low sugar spells. Most numbers are generally in good range when checked fasting. Generally does not miss a dose of medication. Watching diabetic diet closely  Glu around 96 or 87   bp 120s over 70s  Review of Systems No headache, no major weight loss or weight gain, no chest pain no back pain abdominal pain no change in bowel habits complete ROS otherwise negative     Objective:   Physical Exam  irtual      Assessment & Plan:  Impression 1 type 2 diabetes apparent good control diet discussed compliance with medications discussed  2.  Hypertension prior control good compliant meds patient maintain  3.  Reactive airways clinically stable  Exercise discussed blood work reviewed.  Medications refilled follow-up in 6 months

## 2019-07-20 ENCOUNTER — Ambulatory Visit: Payer: Medicare Other | Admitting: Family Medicine

## 2019-09-01 ENCOUNTER — Other Ambulatory Visit (HOSPITAL_COMMUNITY): Payer: Self-pay | Admitting: Nurse Practitioner

## 2019-09-01 DIAGNOSIS — C50811 Malignant neoplasm of overlapping sites of right female breast: Secondary | ICD-10-CM

## 2019-09-03 ENCOUNTER — Inpatient Hospital Stay (HOSPITAL_COMMUNITY): Payer: Medicare Other | Attending: Hematology

## 2019-09-03 ENCOUNTER — Other Ambulatory Visit: Payer: Self-pay

## 2019-09-03 DIAGNOSIS — D0582 Other specified type of carcinoma in situ of left breast: Secondary | ICD-10-CM | POA: Insufficient documentation

## 2019-09-03 DIAGNOSIS — C50811 Malignant neoplasm of overlapping sites of right female breast: Secondary | ICD-10-CM | POA: Diagnosis not present

## 2019-09-03 DIAGNOSIS — Z7982 Long term (current) use of aspirin: Secondary | ICD-10-CM | POA: Diagnosis not present

## 2019-09-03 DIAGNOSIS — I10 Essential (primary) hypertension: Secondary | ICD-10-CM | POA: Insufficient documentation

## 2019-09-03 DIAGNOSIS — Z170 Estrogen receptor positive status [ER+]: Secondary | ICD-10-CM | POA: Insufficient documentation

## 2019-09-03 DIAGNOSIS — J45909 Unspecified asthma, uncomplicated: Secondary | ICD-10-CM | POA: Insufficient documentation

## 2019-09-03 DIAGNOSIS — M8580 Other specified disorders of bone density and structure, unspecified site: Secondary | ICD-10-CM | POA: Diagnosis not present

## 2019-09-03 DIAGNOSIS — Z79811 Long term (current) use of aromatase inhibitors: Secondary | ICD-10-CM | POA: Insufficient documentation

## 2019-09-03 DIAGNOSIS — Z90710 Acquired absence of both cervix and uterus: Secondary | ICD-10-CM | POA: Insufficient documentation

## 2019-09-03 DIAGNOSIS — E876 Hypokalemia: Secondary | ICD-10-CM | POA: Insufficient documentation

## 2019-09-03 DIAGNOSIS — Z23 Encounter for immunization: Secondary | ICD-10-CM | POA: Insufficient documentation

## 2019-09-03 DIAGNOSIS — Z791 Long term (current) use of non-steroidal anti-inflammatories (NSAID): Secondary | ICD-10-CM | POA: Insufficient documentation

## 2019-09-03 DIAGNOSIS — Z9221 Personal history of antineoplastic chemotherapy: Secondary | ICD-10-CM | POA: Insufficient documentation

## 2019-09-03 DIAGNOSIS — Z79899 Other long term (current) drug therapy: Secondary | ICD-10-CM | POA: Insufficient documentation

## 2019-09-03 DIAGNOSIS — Z9013 Acquired absence of bilateral breasts and nipples: Secondary | ICD-10-CM | POA: Insufficient documentation

## 2019-09-03 LAB — CBC WITH DIFFERENTIAL/PLATELET
Abs Immature Granulocytes: 0.05 10*3/uL (ref 0.00–0.07)
Basophils Absolute: 0.1 10*3/uL (ref 0.0–0.1)
Basophils Relative: 1 %
Eosinophils Absolute: 2.2 10*3/uL — ABNORMAL HIGH (ref 0.0–0.5)
Eosinophils Relative: 24 %
HCT: 37 % (ref 36.0–46.0)
Hemoglobin: 12.1 g/dL (ref 12.0–15.0)
Immature Granulocytes: 1 %
Lymphocytes Relative: 15 %
Lymphs Abs: 1.3 10*3/uL (ref 0.7–4.0)
MCH: 29.8 pg (ref 26.0–34.0)
MCHC: 32.7 g/dL (ref 30.0–36.0)
MCV: 91.1 fL (ref 80.0–100.0)
Monocytes Absolute: 1 10*3/uL (ref 0.1–1.0)
Monocytes Relative: 11 %
Neutro Abs: 4.5 10*3/uL (ref 1.7–7.7)
Neutrophils Relative %: 48 %
Platelets: 229 10*3/uL (ref 150–400)
RBC: 4.06 MIL/uL (ref 3.87–5.11)
RDW: 14 % (ref 11.5–15.5)
WBC: 9 10*3/uL (ref 4.0–10.5)
nRBC: 0 % (ref 0.0–0.2)

## 2019-09-03 LAB — COMPREHENSIVE METABOLIC PANEL
ALT: 13 U/L (ref 0–44)
AST: 20 U/L (ref 15–41)
Albumin: 3.6 g/dL (ref 3.5–5.0)
Alkaline Phosphatase: 87 U/L (ref 38–126)
Anion gap: 9 (ref 5–15)
BUN: 23 mg/dL (ref 8–23)
CO2: 25 mmol/L (ref 22–32)
Calcium: 9.6 mg/dL (ref 8.9–10.3)
Chloride: 103 mmol/L (ref 98–111)
Creatinine, Ser: 0.97 mg/dL (ref 0.44–1.00)
GFR calc Af Amer: >60 mL/min (ref >60)
GFR calc non Af Amer: 59 mL/min — ABNORMAL LOW (ref >60)
Glucose, Bld: 95 mg/dL (ref 70–99)
Potassium: 4.3 mmol/L (ref 3.5–5.1)
Sodium: 137 mmol/L (ref 135–145)
Total Bilirubin: 0.2 mg/dL — ABNORMAL LOW (ref 0.3–1.2)
Total Protein: 6.8 g/dL (ref 6.5–8.1)

## 2019-09-03 LAB — IRON AND TIBC
Iron: 46 ug/dL (ref 28–170)
Saturation Ratios: 12 % (ref 10.4–31.8)
TIBC: 388 ug/dL (ref 250–450)
UIBC: 342 ug/dL

## 2019-09-03 LAB — FERRITIN: Ferritin: 26 ng/mL (ref 11–307)

## 2019-09-03 LAB — LACTATE DEHYDROGENASE: LDH: 167 U/L (ref 98–192)

## 2019-09-03 LAB — FOLATE: Folate: 9.3 ng/mL (ref >5.9)

## 2019-09-03 LAB — VITAMIN B12: Vitamin B-12: 207 pg/mL (ref 180–914)

## 2019-09-04 LAB — VITAMIN D 25 HYDROXY (VIT D DEFICIENCY, FRACTURES): Vit D, 25-Hydroxy: 33.5 ng/mL (ref 30.0–100.0)

## 2019-09-08 DIAGNOSIS — B351 Tinea unguium: Secondary | ICD-10-CM | POA: Diagnosis not present

## 2019-09-08 DIAGNOSIS — E1142 Type 2 diabetes mellitus with diabetic polyneuropathy: Secondary | ICD-10-CM | POA: Diagnosis not present

## 2019-09-09 ENCOUNTER — Other Ambulatory Visit (HOSPITAL_COMMUNITY): Payer: Self-pay | Admitting: Nurse Practitioner

## 2019-09-10 ENCOUNTER — Inpatient Hospital Stay (HOSPITAL_COMMUNITY): Payer: Medicare Other

## 2019-09-10 ENCOUNTER — Inpatient Hospital Stay (HOSPITAL_BASED_OUTPATIENT_CLINIC_OR_DEPARTMENT_OTHER): Payer: Medicare Other | Admitting: Nurse Practitioner

## 2019-09-10 ENCOUNTER — Other Ambulatory Visit (HOSPITAL_COMMUNITY): Payer: Self-pay | Admitting: Nurse Practitioner

## 2019-09-10 ENCOUNTER — Other Ambulatory Visit: Payer: Self-pay

## 2019-09-10 VITALS — BP 138/71 | HR 63 | Temp 97.5°F | Resp 18 | Wt 152.8 lb

## 2019-09-10 DIAGNOSIS — C50811 Malignant neoplasm of overlapping sites of right female breast: Secondary | ICD-10-CM | POA: Diagnosis not present

## 2019-09-10 DIAGNOSIS — Z7982 Long term (current) use of aspirin: Secondary | ICD-10-CM | POA: Diagnosis not present

## 2019-09-10 DIAGNOSIS — E876 Hypokalemia: Secondary | ICD-10-CM | POA: Diagnosis not present

## 2019-09-10 DIAGNOSIS — M8580 Other specified disorders of bone density and structure, unspecified site: Secondary | ICD-10-CM | POA: Diagnosis not present

## 2019-09-10 DIAGNOSIS — Z170 Estrogen receptor positive status [ER+]: Secondary | ICD-10-CM

## 2019-09-10 DIAGNOSIS — Z0000 Encounter for general adult medical examination without abnormal findings: Secondary | ICD-10-CM

## 2019-09-10 DIAGNOSIS — I10 Essential (primary) hypertension: Secondary | ICD-10-CM | POA: Diagnosis not present

## 2019-09-10 DIAGNOSIS — Z79899 Other long term (current) drug therapy: Secondary | ICD-10-CM | POA: Diagnosis not present

## 2019-09-10 DIAGNOSIS — J45909 Unspecified asthma, uncomplicated: Secondary | ICD-10-CM | POA: Diagnosis not present

## 2019-09-10 DIAGNOSIS — Z9013 Acquired absence of bilateral breasts and nipples: Secondary | ICD-10-CM | POA: Diagnosis not present

## 2019-09-10 DIAGNOSIS — D0582 Other specified type of carcinoma in situ of left breast: Secondary | ICD-10-CM | POA: Diagnosis not present

## 2019-09-10 DIAGNOSIS — Z23 Encounter for immunization: Secondary | ICD-10-CM | POA: Diagnosis not present

## 2019-09-10 DIAGNOSIS — Z791 Long term (current) use of non-steroidal anti-inflammatories (NSAID): Secondary | ICD-10-CM | POA: Diagnosis not present

## 2019-09-10 DIAGNOSIS — Z79811 Long term (current) use of aromatase inhibitors: Secondary | ICD-10-CM | POA: Diagnosis not present

## 2019-09-10 DIAGNOSIS — Z9221 Personal history of antineoplastic chemotherapy: Secondary | ICD-10-CM | POA: Diagnosis not present

## 2019-09-10 MED ORDER — DENOSUMAB 60 MG/ML ~~LOC~~ SOSY
60.0000 mg | PREFILLED_SYRINGE | Freq: Once | SUBCUTANEOUS | Status: AC
  Administered 2019-09-10: 60 mg via SUBCUTANEOUS

## 2019-09-10 MED ORDER — INFLUENZA VAC A&B SA ADJ QUAD 0.5 ML IM PRSY
0.5000 mL | PREFILLED_SYRINGE | Freq: Once | INTRAMUSCULAR | Status: AC
  Administered 2019-09-10: 12:00:00 0.5 mL via INTRAMUSCULAR
  Filled 2019-09-10: qty 0.5

## 2019-09-10 MED ORDER — DENOSUMAB 60 MG/ML ~~LOC~~ SOSY
PREFILLED_SYRINGE | SUBCUTANEOUS | Status: AC
  Filled 2019-09-10: qty 1

## 2019-09-10 NOTE — Progress Notes (Signed)
Consent for Prolia obtained today prior to giving Prolia. Prolia teaching sheet given and reviewed. I asked patient to have daughter review and call if there were any questions. I asked patient to make sure that dentist knew at her next dental appt that she is taking Prolia. Patient taking Calcium and Vitamin D 2 tablets everyday. I instructed pt to continue taking that supplement.   FLUAD given today in RT deltoid.

## 2019-09-10 NOTE — Patient Instructions (Signed)
[facility] Discharge Instructions  Follow up in 3 months with lab s   Thank you for choosing [facility] to provide your oncology and hematology care.  To afford each patient quality time with our provider, please arrive at least 15 minutes before your scheduled appointment time.   If you have a lab appointment with the Cancer Center please come in thru the Main Entrance and check in at the main information desk.  You need to re-schedule your appointment should you arrive 10 or more minutes late.  We strive to give you quality time with our providers, and arriving late affects you and other patients whose appointments are after yours.  Also, if you no show three or more times for appointments you may be dismissed from the clinic at the providers discretion.     Again, thank you for choosing [facility].  Our hope is that these requests will decrease the amount of time that you wait before being seen by our physicians.       _____________________________________________________________  Should you have questions after your visit to [facility], please contact our office at [phone] between the hours of 8:00 a.m. and 4:30 p.m.  Voicemails left after 4:00 p.m. will not be returned until the following business day.  For prescription refill requests, have your pharmacy contact our office and allow 72 hours.    Due to Covid, you will need to wear a mask upon entering the hospital. If you do not have a mask, a mask will be given to you at the Main Entrance upon arrival. For doctor visits, patients may have 1 support person with them. For treatment visits, patients can not have anyone with them due to social distancing guidelines and our immunocompromised population.      

## 2019-09-10 NOTE — Progress Notes (Signed)
[address] [place], [place] [address]   CLINIC:  Medical Oncology/Hematology  PCP:  Mikey Kirschner, [place] [address] [phone]   REASON FOR VISIT: Follow-up for Breast cancer  CURRENT THERAPY: Anastrozole  BRIEF ONCOLOGIC HISTORY:  Oncology History  Breast cancer, right ([place])  05/12/2018 Initial Diagnosis   Breast cancer, right ([place])   06/05/2018 - 10/30/2018 Chemotherapy   The patient had DOXOrubicin (ADRIAMYCIN) chemo injection 110 mg, 60 mg/m2 = 110 mg, Intravenous,  Once, 4 of 4 cycles Administration: 110 mg (06/05/2018), 110 mg (06/19/2018), 110 mg (07/07/2018), 110 mg (07/21/2018) palonosetron (ALOXI) injection 0.25 mg, 0.25 mg, Intravenous,  Once, 4 of 4 cycles Administration: 0.25 mg (06/05/2018), 0.25 mg (06/19/2018), 0.25 mg (07/07/2018), 0.25 mg (07/21/2018) pegfilgrastim-cbqv (UDENYCA) injection 6 mg, 6 mg, Subcutaneous, Once, 4 of 4 cycles Administration: 6 mg (06/06/2018), 6 mg (06/20/2018), 6 mg (07/08/2018), 6 mg (07/22/2018) cyclophosphamide (CYTOXAN) 1,100 mg in sodium chloride 0.9 % 250 mL chemo infusion, 600 mg/m2 = 1,100 mg, Intravenous,  Once, 4 of 4 cycles Administration: 1,100 mg (06/05/2018), 1,100 mg (06/19/2018), 1,100 mg (07/07/2018), 1,100 mg (07/21/2018) PACLitaxel (TAXOL) 144 mg in sodium chloride 0.9 % 250 mL chemo infusion (</= 73m/m2), 80 mg/m2 = 144 mg, Intravenous,  Once, 12 of 12 cycles Administration: 144 mg (08/07/2018), 144 mg (08/14/2018), 144 mg (08/21/2018), 144 mg (08/28/2018), 144 mg (09/04/2018), 144 mg (09/11/2018), 144 mg (09/18/2018), 144 mg (09/25/2018), 144 mg (10/02/2018), 144 mg (10/09/2018), 144 mg (10/17/2018), 144 mg (10/24/2018) fosaprepitant (EMEND) 150 mg, dexamethasone (DECADRON) 12 mg in sodium chloride 0.9 % 145 mL IVPB, , Intravenous,  Once, 4 of 4 cycles Administration:  (06/05/2018),  (06/19/2018),  (07/07/2018),  (07/21/2018)  for chemotherapy treatment.      INTERVAL HISTORY:  Ms.  [id number]. female returns for routine follow-up for breast cancer.  She reports she has been doing well since her last visit.  She reports she is much stronger since completing physical therapy.  She reports she still does her exercises at home daily.  She denies any bright red bleeding per rectum or melena.  She denies any pain. Denies any nausea, vomiting, or diarrhea. Denies any new pains. Had not noticed any recent bleeding such as epistaxis, hematuria or hematochezia. Denies recent chest pain on exertion, shortness of breath on minimal exertion, pre-syncopal episodes, or palpitations. Denies any numbness or tingling in hands or feet. Denies any recent fevers, infections, or recent hospitalizations. Patient reports appetite at 75% and energy level at 75%. She is eating well and maintaining her weight at this time.     REVIEW OF SYSTEMS:  Review of Systems  All other systems reviewed and are negative.    PAST MEDICAL/SURGICAL HISTORY:  Past Medical History:  Diagnosis Date  . Asthma   . Hypertension    Past Surgical History:  Procedure Laterality Date  . ABDOMINAL HYSTERECTOMY    . MASTECTOMY MODIFIED RADICAL Right 12/01/2018   Procedure: RIGHT MODIFIED RADICAL MASTECTOMY (Procedure #1);  Surgeon: JAviva Signs MD;  Location: [facility];  Service: General;  Laterality: Right;  . PORTACATH PLACEMENT Left 06/04/2018   Procedure: INSERTION PORT-A-CATH;  Surgeon: JAviva Signs MD;  Location: [facility];  Service: General;  Laterality: Left;  . SIMPLE MASTECTOMY WITH AXILLARY SENTINEL NODE BIOPSY Left 12/01/2018   Procedure: LEFT SIMPLE MASTECTOMY (Procedure #2);  Surgeon: JAviva Signs MD;  Location: [facility];  Service: General;  Laterality: Left;  SOCIAL HISTORY:  Social History   Socioeconomic History  . Marital status: Widowed    Spouse name: Not on file  . Number of children: Not on file  . Years of education: Not on file  . Highest education level: Not on file  Occupational  History  . Not on file  Social Needs  . Financial resource strain: Not on file  . Food insecurity    Worry: Not on file    Inability: Not on file  . Transportation needs    Medical: Not on file    Non-medical: Not on file  Tobacco Use  . Smoking status: Never Smoker  . Smokeless tobacco: Never Used  Substance and Sexual Activity  . Alcohol use: Never    Frequency: Never  . Drug use: Never  . Sexual activity: Not on file  Lifestyle  . Physical activity    Days per week: Not on file    Minutes per session: Not on file  . Stress: Not on file  Relationships  . Social Herbalist on phone: Not on file    Gets together: Not on file    Attends religious service: Not on file    Active member of club or organization: Not on file    Attends meetings of clubs or organizations: Not on file    Relationship status: Not on file  . Intimate partner violence    Fear of current or ex partner: Not on file    Emotionally abused: Not on file    Physically abused: Not on file    Forced sexual activity: Not on file  Other Topics Concern  . Not on file  Social History Narrative  . Not on file    FAMILY HISTORY:  No family history on file.  CURRENT MEDICATIONS:  Outpatient Encounter Medications as of 09/10/2019  Medication Sig  . amLODipine (NORVASC) 5 MG tablet Take one daily  . anastrozole (ARIMIDEX) 1 MG tablet TAKE ONE (1) TABLET BY MOUTH EVERY DAY  . aspirin 81 MG tablet Take 81 mg by mouth daily.  . Calcium Carb-Cholecalciferol (CALCIUM + VITAMIN D3 PO) Take 1 tablet by mouth 2 (two) times daily.  . Flaxseed, Linseed, (FLAXSEED OIL) 1000 MG CAPS Take 1,000 mg by mouth daily.   . montelukast (SINGULAIR) 10 MG tablet TAKE ONE TABLET (10MG TOTAL) BY MOUTH ATBEDTIME  . potassium chloride SA (K-DUR) 20 MEQ tablet Take 1 tablet (20 mEq total) by mouth 2 (two) times daily.  Marland Kitchen albuterol (PROAIR HFA) 108 (90 Base) MCG/ACT inhaler INHALE TWO PUFFS INTO THE LUNGS EVERY SIX HOURS AS  NEEDED FOR WHEEZING (Patient not taking: Reported on 09/10/2019)  . albuterol (PROVENTIL) (2.5 MG/3ML) 0.083% nebulizer solution Use via neb q 4 hrs prn wheezing (Patient not taking: Reported on 09/10/2019)  . ibuprofen (ADVIL) 200 MG tablet Take 200 mg by mouth at bedtime as needed.  . lactulose (CHRONULAC) 10 GM/15ML solution Take 30 mLs (20 g total) by mouth daily. (Patient not taking: Reported on 07/16/2019)  . lidocaine-prilocaine (EMLA) cream Apply to affected area once (Patient not taking: Reported on 09/10/2019)  . traMADol (ULTRAM) 50 MG tablet Take 1 tablet (50 mg total) by mouth every 6 (six) hours as needed. (Patient not taking: Reported on 09/10/2019)  . triamcinolone cream (KENALOG) 0.1 % Apply twice daily to affected areas. (Patient not taking: Reported on 09/10/2019)   Facility-Administered Encounter Medications as of 09/10/2019  Medication  . influenza vaccine adjuvanted (FLUAD) injection 0.5  mL    ALLERGIES:  Allergies  Allergen Reactions  . Penicillins Other (See Comments)    Nausea, vomiting, dizziness Has patient had a PCN reaction causing immediate rash, facial/tongue/throat swelling, SOB or lightheadedness with hypotension: no Has patient had a PCN reaction causing severe rash involving mucus membranes or skin necrosis: no Has patient had a PCN reaction that required hospitalization: no Has patient had a PCN reaction occurring within the last 10 years: no If all of the above answers are "NO", then may proceed with Cephalosporin use.   Marland Kitchen Percocet [Oxycodone-Acetaminophen] Diarrhea and Nausea And Vomiting  . Ranitidine Itching     PHYSICAL EXAM:  ECOG Performance status: 1  Vitals:   09/10/19 1102  BP: 138/71  Pulse: 63  Resp: 18  Temp: (!) 97.5 F (36.4 C)  SpO2: 100%   Filed Weights   09/10/19 1102  Weight: 152 lb 12.8 oz (69.3 kg)    Physical Exam Constitutional:      Appearance: Normal appearance. She is normal weight.  Cardiovascular:     Rate and  Rhythm: Normal rate and regular rhythm.     Heart sounds: Normal heart sounds.  Pulmonary:     Effort: Pulmonary effort is normal.     Breath sounds: Normal breath sounds.  Abdominal:     General: Bowel sounds are normal.     Palpations: Abdomen is soft.  Musculoskeletal: Normal range of motion.  Skin:    General: Skin is warm and dry.  Neurological:     Mental Status: She is alert and oriented to person, place, and time. Mental status is at baseline.  Psychiatric:        Mood and Affect: Mood normal.        Behavior: Behavior normal.        Thought Content: Thought content normal.        Judgment: Judgment normal.   Breast: Lateral mastectomy sites well-healed.  Scar tissue present.  No adenopathy or masses present.  LABORATORY DATA:  I have reviewed the labs as listed.  CBC    Component Value Date/Time   WBC 9.0 09/03/2019 1103   RBC 4.06 09/03/2019 1103   HGB 12.1 09/03/2019 1103   HGB 12.4 02/07/2017 1109   HCT 37.0 09/03/2019 1103   HCT 38.2 02/07/2017 1109   PLT 229 09/03/2019 1103   PLT 335 02/07/2017 1109   MCV 91.1 09/03/2019 1103   MCV 86 02/07/2017 1109   MCH 29.8 09/03/2019 1103   MCHC 32.7 09/03/2019 1103   RDW 14.0 09/03/2019 1103   RDW 15.1 02/07/2017 1109   LYMPHSABS 1.3 09/03/2019 1103   LYMPHSABS 3.0 02/07/2017 1109   MONOABS 1.0 09/03/2019 1103   EOSABS 2.2 (H) 09/03/2019 1103   EOSABS 1.1 (H) 02/07/2017 1109   BASOSABS 0.1 09/03/2019 1103   BASOSABS 0.0 02/07/2017 1109   CMP Latest Ref Rng & Units 09/03/2019 05/01/2019 01/05/2019  Glucose 70 - 99 mg/dL 95 101(H) 79  BUN 8 - 23 mg/dL _0 Creatinine 0.44 - 1.00 mg/dL 0.97 0.91 0.90  Sodium 135 - 145 mmol/L 137 137 136  Potassium 3.5 - 5.1 mmol/L 4.3 3.6 4.2  Chloride 98 - 111 mmol/L 103 102 104  CO2 22 - 32 mmol/L _1 Calcium 8.9 - 10.3 mg/dL 9.6 9.6 10.2  Total Protein 6.5 - 8.1 g/dL 6.8 7.5 7.7  Total Bilirubin 0.3 - 1.2 mg/dL 0.2(L) 0.5 0.7  Alkaline Phos 38 - 126 U/L 87  82 59   AST 15 - 41 U/L _0 ALT 0 - 44 U/L _1 I personally performed a face-to-face visit.  All questions were answered to patient's stated satisfaction. Encouraged patient to call with any new concerns or questions before his next visit to the cancer center and we can certain see him sooner, if needed.     ASSESSMENT & PLAN:   Breast cancer, right ([place]) 1.  Locally advanced right breast cancer, stage IIIa: - She had a mammogram on 03/28/2018's that was B RADS category 0. - She had a diagnostic mammogram and ultrasound on 04/22/2018 that showed a 5.9 x 5 x 6 cm mass extending in the upper right quadrant of the right breast and right axillary lymph nodes measuring 2.8 x 1.6 x 1.7 cm.  Left breast has retroareolar mass. - Patient did not have mammogram in the last 20 years. - Biopsy on 04/29/2018 of the right breast mass was consistent with IDC, grade 2-3, lymph node biopsy positive for metastatic disease, ER/PR positive, HER-2 negative, Ki-67 of 5%. - She was seen by Dr. Arnoldo Morale who recommended right modified radical mastectomy and left simple mastectomy. - Bone scan showed increased uptake in the right greater trochanter and right sacroiliac joint. - CT scan of the chest abdomen pelvis did not show any evidence of metastatic disease.  The uptake in the right greater trochanter was thought to be due to osteoporotic changes.  Uptake in the right sacroiliac joint was likely due from degenerative changes at the right L5-S1 joint. - Neoadjuvant chemotherapy with dose dense AC followed by weekly paclitaxel for 12 weeks. - 4 cycles of dose dense AC from 06/05/2018 through 07/21/2018. -12 cycles of weekly paclitaxel from 08/07/2018 through 10/23/2018. -Anastrozole was started on 11/07/2018 -Right modified radical mastectomy on 12/01/2018 with left simple mastectomy. 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 - Receptor markers were ordered and ER was positive, PR was negative, and HER-2 was negative.  Ki 67 was less than 1%. - She is tolerating anastrozole very well.  Her lower extremity strength has improved after physical therapy and she is able to walk without a walker now. -She had a PET CT scan on 01/05/2019 which did not show any recurrent or metastatic disease. -2020 showed WBC 9.9, hemoglobin 12.1, platelets 229, LDH 167, potassium 4.3, creatinine 0.97. -She will follow-up in 3 months with repeat labs.  2.  Left breast DCIS: -Biopsy on 04/29/2018 of the left breast showed DCIS, ER/PR positive.  Left axillary lymph node biopsy was negative for malignancy. -Left simple mastectomy on 12/01/2018 showed 7 mm DCIS, intermediate grade, with cribriform, papillary and cystic morphology.  No evidence of invasion. - Bilateral mastectomy sites were well-healed no adenopathy or masses present.  3.  Hypokalemia: - Labs on 09/30/2019 showed potassium 4.3 -She will continue taking her potassium supplements at this time.  4.  Osteopenia: - Last DEXA scan on 11/13/2018 showed T score of -0.8. -She is taking calcium and vitamin D twice daily. -I have recommended Prolia every 6 months. -She has recently been cleared by the dentist and had teeth pulled. - We will give her her first Prolia injection today on 09/10/2019       Orders placed this encounter:  Orders Placed  This Encounter  Procedures  . Lactate dehydrogenase  . CBC with Differential/Platelet  . Comprehensive metabolic panel  . Vitamin B12  . VITAMIN D 25 Hydroxy (Vit-D Deficiency, Fractures)      Francene Finders, FNP-C [place] [phone]

## 2019-09-10 NOTE — Assessment & Plan Note (Addendum)
1.  Locally advanced right breast cancer, stage IIIa: - She had a mammogram on 03/28/2018's that was B RADS category 0. - She had a diagnostic mammogram and ultrasound on 04/22/2018 that showed a 5.9 x 5 x 6 cm mass extending in the upper right quadrant of the right breast and right axillary lymph nodes measuring 2.8 x 1.6 x 1.7 cm.  Left breast has retroareolar mass. - Patient did not have mammogram in the last 20 years. - Biopsy on 04/29/2018 of the right breast mass was consistent with IDC, grade 2-3, lymph node biopsy positive for metastatic disease, ER/PR positive, HER-2 negative, Ki-67 of 5%. - She was seen by Dr. Arnoldo Morale who recommended right modified radical mastectomy and left simple mastectomy. - Bone scan showed increased uptake in the right greater trochanter and right sacroiliac joint. - CT scan of the chest abdomen pelvis did not show any evidence of metastatic disease.  The uptake in the right greater trochanter was thought to be due to osteoporotic changes.  Uptake in the right sacroiliac joint was likely due from degenerative changes at the right L5-S1 joint. - Neoadjuvant chemotherapy with dose dense AC followed by weekly paclitaxel for 12 weeks. - 4 cycles of dose dense AC from 06/05/2018 through 07/21/2018. -12 cycles of weekly paclitaxel from 08/07/2018 through 10/23/2018. -Anastrozole was started on 11/07/2018 -Right modified radical mastectomy on 12/01/2018 with left simple mastectomy. 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 - Receptor markers were ordered and ER was positive, PR was negative, and HER-2 was negative.  Ki 67 was less than 1%. - She is tolerating anastrozole very well.  Her lower extremity strength has improved after physical therapy and she is able to walk  without a walker now. -She had a PET CT scan on 01/05/2019 which did not show any recurrent or metastatic disease. -2020 showed WBC 9.9, hemoglobin 12.1, platelets 229, LDH 167, potassium 4.3, creatinine 0.97. -She will follow-up in 3 months with repeat labs.  2.  Left breast DCIS: -Biopsy on 04/29/2018 of the left breast showed DCIS, ER/PR positive.  Left axillary lymph node biopsy was negative for malignancy. -Left simple mastectomy on 12/01/2018 showed 7 mm DCIS, intermediate grade, with cribriform, papillary and cystic morphology.  No evidence of invasion. - Bilateral mastectomy sites were well-healed no adenopathy or masses present.  3.  Hypokalemia: - Labs on 09/30/2019 showed potassium 4.3 -She will continue taking her potassium supplements at this time.  4.  Osteopenia: - Last DEXA scan on 11/13/2018 showed T score of -0.8. -She is taking calcium and vitamin D twice daily. -I have recommended Prolia every 6 months. -She has recently been cleared by the dentist and had teeth pulled. - We will give her her first Prolia injection today on 09/10/2019

## 2019-10-13 ENCOUNTER — Other Ambulatory Visit: Payer: Self-pay | Admitting: Family Medicine

## 2019-10-15 ENCOUNTER — Other Ambulatory Visit: Payer: Self-pay | Admitting: *Deleted

## 2019-10-15 ENCOUNTER — Telehealth: Payer: Self-pay | Admitting: Family Medicine

## 2019-10-15 MED ORDER — MONTELUKAST SODIUM 10 MG PO TABS: ORAL_TABLET | ORAL | 3 refills | Status: DC

## 2019-10-15 NOTE — Telephone Encounter (Signed)
Pt's pharmacy sent refill request for pt's  montelukast (SINGULAIR) 10 MG tablet on 10/13/2019 & we have not sent it back  Pt states she needs it because she took her last one last night  Please advise & call pt - ok to [facility]

## 2019-10-15 NOTE — Telephone Encounter (Signed)
One yr worth

## 2019-10-15 NOTE — Telephone Encounter (Signed)
Refills sent to [facility]. Left message to return call to notify pt

## 2019-10-15 NOTE — Telephone Encounter (Signed)
Please advise. Thank you

## 2019-10-16 NOTE — Telephone Encounter (Signed)
Pt.notified

## 2019-12-03 ENCOUNTER — Other Ambulatory Visit: Payer: Self-pay

## 2019-12-03 ENCOUNTER — Inpatient Hospital Stay (HOSPITAL_COMMUNITY): Payer: Medicare Other | Attending: Hematology

## 2019-12-03 DIAGNOSIS — Z9013 Acquired absence of bilateral breasts and nipples: Secondary | ICD-10-CM | POA: Insufficient documentation

## 2019-12-03 DIAGNOSIS — E876 Hypokalemia: Secondary | ICD-10-CM | POA: Insufficient documentation

## 2019-12-03 DIAGNOSIS — Z9221 Personal history of antineoplastic chemotherapy: Secondary | ICD-10-CM | POA: Diagnosis not present

## 2019-12-03 DIAGNOSIS — M8580 Other specified disorders of bone density and structure, unspecified site: Secondary | ICD-10-CM | POA: Insufficient documentation

## 2019-12-03 DIAGNOSIS — Z170 Estrogen receptor positive status [ER+]: Secondary | ICD-10-CM | POA: Diagnosis not present

## 2019-12-03 DIAGNOSIS — Z90710 Acquired absence of both cervix and uterus: Secondary | ICD-10-CM | POA: Diagnosis not present

## 2019-12-03 DIAGNOSIS — C50811 Malignant neoplasm of overlapping sites of right female breast: Secondary | ICD-10-CM | POA: Insufficient documentation

## 2019-12-03 DIAGNOSIS — Z452 Encounter for adjustment and management of vascular access device: Secondary | ICD-10-CM | POA: Insufficient documentation

## 2019-12-03 DIAGNOSIS — Z791 Long term (current) use of non-steroidal anti-inflammatories (NSAID): Secondary | ICD-10-CM | POA: Diagnosis not present

## 2019-12-03 DIAGNOSIS — I10 Essential (primary) hypertension: Secondary | ICD-10-CM | POA: Insufficient documentation

## 2019-12-03 DIAGNOSIS — Z7982 Long term (current) use of aspirin: Secondary | ICD-10-CM | POA: Diagnosis not present

## 2019-12-03 DIAGNOSIS — C773 Secondary and unspecified malignant neoplasm of axilla and upper limb lymph nodes: Secondary | ICD-10-CM | POA: Insufficient documentation

## 2019-12-03 DIAGNOSIS — Z79811 Long term (current) use of aromatase inhibitors: Secondary | ICD-10-CM | POA: Insufficient documentation

## 2019-12-03 DIAGNOSIS — D0512 Intraductal carcinoma in situ of left breast: Secondary | ICD-10-CM | POA: Diagnosis not present

## 2019-12-03 DIAGNOSIS — Z79899 Other long term (current) drug therapy: Secondary | ICD-10-CM | POA: Diagnosis not present

## 2019-12-03 DIAGNOSIS — J45909 Unspecified asthma, uncomplicated: Secondary | ICD-10-CM | POA: Insufficient documentation

## 2019-12-03 LAB — CBC WITH DIFFERENTIAL/PLATELET
Abs Immature Granulocytes: 0.04 10*3/uL (ref 0.00–0.07)
Basophils Absolute: 0 10*3/uL (ref 0.0–0.1)
Basophils Relative: 1 %
Eosinophils Absolute: 0.9 10*3/uL — ABNORMAL HIGH (ref 0.0–0.5)
Eosinophils Relative: 11 %
HCT: 39.7 % (ref 36.0–46.0)
Hemoglobin: 13 g/dL (ref 12.0–15.0)
Immature Granulocytes: 1 %
Lymphocytes Relative: 21 %
Lymphs Abs: 1.6 10*3/uL (ref 0.7–4.0)
MCH: 29.3 pg (ref 26.0–34.0)
MCHC: 32.7 g/dL (ref 30.0–36.0)
MCV: 89.6 fL (ref 80.0–100.0)
Monocytes Absolute: 1.1 10*3/uL — ABNORMAL HIGH (ref 0.1–1.0)
Monocytes Relative: 14 %
Neutro Abs: 3.9 10*3/uL (ref 1.7–7.7)
Neutrophils Relative %: 52 %
Platelets: 262 10*3/uL (ref 150–400)
RBC: 4.43 MIL/uL (ref 3.87–5.11)
RDW: 13.8 % (ref 11.5–15.5)
WBC: 7.6 10*3/uL (ref 4.0–10.5)
nRBC: 0 % (ref 0.0–0.2)

## 2019-12-03 LAB — COMPREHENSIVE METABOLIC PANEL
ALT: 11 U/L (ref 0–44)
AST: 17 U/L (ref 15–41)
Albumin: 4.1 g/dL (ref 3.5–5.0)
Alkaline Phosphatase: 70 U/L (ref 38–126)
Anion gap: 10 (ref 5–15)
BUN: 15 mg/dL (ref 8–23)
CO2: 25 mmol/L (ref 22–32)
Calcium: 9.7 mg/dL (ref 8.9–10.3)
Chloride: 100 mmol/L (ref 98–111)
Creatinine, Ser: 1.01 mg/dL — ABNORMAL HIGH (ref 0.44–1.00)
GFR calc Af Amer: >60 mL/min (ref >60)
GFR calc non Af Amer: 56 mL/min — ABNORMAL LOW (ref >60)
Glucose, Bld: 80 mg/dL (ref 70–99)
Potassium: 4.2 mmol/L (ref 3.5–5.1)
Sodium: 135 mmol/L (ref 135–145)
Total Bilirubin: 0.5 mg/dL (ref 0.3–1.2)
Total Protein: 7.7 g/dL (ref 6.5–8.1)

## 2019-12-03 LAB — VITAMIN D 25 HYDROXY (VIT D DEFICIENCY, FRACTURES): Vit D, 25-Hydroxy: 25.51 ng/mL — ABNORMAL LOW (ref 30–100)

## 2019-12-03 LAB — VITAMIN B12: Vitamin B-12: 481 pg/mL (ref 180–914)

## 2019-12-03 LAB — LACTATE DEHYDROGENASE: LDH: 169 U/L (ref 98–192)

## 2019-12-10 ENCOUNTER — Encounter (HOSPITAL_COMMUNITY): Payer: Self-pay | Admitting: Nurse Practitioner

## 2019-12-10 ENCOUNTER — Inpatient Hospital Stay (HOSPITAL_COMMUNITY): Payer: Medicare Other

## 2019-12-10 ENCOUNTER — Inpatient Hospital Stay (HOSPITAL_COMMUNITY): Payer: Medicare Other | Admitting: Nurse Practitioner

## 2019-12-10 ENCOUNTER — Other Ambulatory Visit: Payer: Self-pay

## 2019-12-10 DIAGNOSIS — Z452 Encounter for adjustment and management of vascular access device: Secondary | ICD-10-CM | POA: Diagnosis not present

## 2019-12-10 DIAGNOSIS — C50811 Malignant neoplasm of overlapping sites of right female breast: Secondary | ICD-10-CM

## 2019-12-10 DIAGNOSIS — Z791 Long term (current) use of non-steroidal anti-inflammatories (NSAID): Secondary | ICD-10-CM | POA: Diagnosis not present

## 2019-12-10 DIAGNOSIS — J45909 Unspecified asthma, uncomplicated: Secondary | ICD-10-CM | POA: Diagnosis not present

## 2019-12-10 DIAGNOSIS — Z9221 Personal history of antineoplastic chemotherapy: Secondary | ICD-10-CM | POA: Diagnosis not present

## 2019-12-10 DIAGNOSIS — Z9013 Acquired absence of bilateral breasts and nipples: Secondary | ICD-10-CM | POA: Diagnosis not present

## 2019-12-10 DIAGNOSIS — C773 Secondary and unspecified malignant neoplasm of axilla and upper limb lymph nodes: Secondary | ICD-10-CM | POA: Diagnosis not present

## 2019-12-10 DIAGNOSIS — Z95828 Presence of other vascular implants and grafts: Secondary | ICD-10-CM

## 2019-12-10 DIAGNOSIS — Z170 Estrogen receptor positive status [ER+]: Secondary | ICD-10-CM

## 2019-12-10 DIAGNOSIS — I10 Essential (primary) hypertension: Secondary | ICD-10-CM | POA: Diagnosis not present

## 2019-12-10 DIAGNOSIS — M8580 Other specified disorders of bone density and structure, unspecified site: Secondary | ICD-10-CM | POA: Diagnosis not present

## 2019-12-10 DIAGNOSIS — E876 Hypokalemia: Secondary | ICD-10-CM | POA: Diagnosis not present

## 2019-12-10 DIAGNOSIS — Z79811 Long term (current) use of aromatase inhibitors: Secondary | ICD-10-CM | POA: Diagnosis not present

## 2019-12-10 DIAGNOSIS — Z79899 Other long term (current) drug therapy: Secondary | ICD-10-CM | POA: Diagnosis not present

## 2019-12-10 DIAGNOSIS — D0512 Intraductal carcinoma in situ of left breast: Secondary | ICD-10-CM | POA: Diagnosis not present

## 2019-12-10 DIAGNOSIS — Z7982 Long term (current) use of aspirin: Secondary | ICD-10-CM | POA: Diagnosis not present

## 2019-12-10 MED ORDER — SODIUM CHLORIDE 0.9% FLUSH
10.0000 mL | Freq: Once | INTRAVENOUS | Status: AC
  Administered 2019-12-10: 12:00:00 10 mL via INTRAVENOUS

## 2019-12-10 MED ORDER — HEPARIN SOD (PORK) LOCK FLUSH 100 UNIT/ML IV SOLN
500.0000 [IU] | Freq: Once | INTRAVENOUS | Status: AC
  Administered 2019-12-10: 500 [IU] via INTRAVENOUS

## 2019-12-10 NOTE — Assessment & Plan Note (Addendum)
1.  Locally advanced right breast cancer, stage IIIa: - She had a mammogram on 03/28/2018's that was B RADS category 0. - She had a diagnostic mammogram and ultrasound on 04/22/2018 that showed a 5.9 x 5 x 6 cm mass extending in the upper right quadrant of the right breast and right axillary lymph nodes measuring 2.8 x 1.6 x 1.7 cm.  Left breast has retroareolar mass. - Patient did not have mammogram in the last 20 years. - Biopsy on 04/29/2018 of the right breast mass was consistent with IDC, grade 2-3, lymph node biopsy positive for metastatic disease, ER/PR positive, HER-2 negative, Ki-67 of 5%. - She was seen by Dr. Arnoldo Morale who recommended right modified radical mastectomy and left simple mastectomy. - Bone scan showed increased uptake in the right greater trochanter and right sacroiliac joint. - CT scan of the chest abdomen pelvis did not show any evidence of metastatic disease.  The uptake in the right greater trochanter was thought to be due to osteoporotic changes.  Uptake in the right sacroiliac joint was likely due from degenerative changes at the right L5-S1 joint. - Neoadjuvant chemotherapy with dose dense AC followed by weekly paclitaxel for 12 weeks. - 4 cycles of dose dense AC from 06/05/2018 through 07/21/2018. -12 cycles of weekly paclitaxel from 08/07/2018 through 10/23/2018. -Anastrozole was started on 11/07/2018 -Right modified radical mastectomy on 12/01/2018 with left simple mastectomy. 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 - Receptor markers were ordered and ER was positive, PR was negative, and HER-2 was negative.  Ki 67 was less than 1%. - She is tolerating anastrozole very well.  Her lower extremity strength has improved after physical therapy and she is able to walk  without a walker now. -She had a PET CT scan on 01/05/2019 which did not show any recurrent or metastatic disease. -Labs done on 12/03/2019 showed WBC 7.6, hemoglobin 13.0, platelets 262, LDH 169, potassium 4.2, creatinine 1.01. -She will follow-up in 4 months with repeat labs.  2.  Left breast DCIS: -Biopsy on 04/29/2018 of the left breast showed DCIS, ER/PR positive.  Left axillary lymph node biopsy was negative for malignancy. -Left simple mastectomy on 12/01/2018 showed 7 mm DCIS, intermediate grade, with cribriform, papillary and cystic morphology.  No evidence of invasion. - Bilateral mastectomy sites were well-healed no adenopathy or masses present.  3.  Hypokalemia: - Labs on 12/03/2019 showed potassium 4.2 -She will continue taking her potassium supplements at this time.  4.  Osteopenia: - Last DEXA scan on 11/13/2018 showed T score of -0.8. -She is taking calcium and vitamin D twice daily. -I have recommended Prolia every 6 months. -She has recently been cleared by the dentist and had teeth pulled. - We will give her her first Prolia injection was on 09/10/2019

## 2019-12-10 NOTE — Patient Instructions (Signed)
[facility] at [facility] Discharge Instructions  Follow up in 4 months with labs    Thank you for choosing [facility] at [facility] to provide your oncology and hematology care.  To afford each patient quality time with our provider, please arrive at least 15 minutes before your scheduled appointment time.   If you have a lab appointment with the Cancer Center please come in thru the Main Entrance and check in at the main information desk.  You need to re-schedule your appointment should you arrive 10 or more minutes late.  We strive to give you quality time with our providers, and arriving late affects you and other patients whose appointments are after yours.  Also, if you no show three or more times for appointments you may be dismissed from the clinic at the providers discretion.     Again, thank you for choosing [facility].  Our hope is that these requests will decrease the amount of time that you wait before being seen by our physicians.       _____________________________________________________________  Should you have questions after your visit to [facility], please contact our office at [phone] between the hours of 8:00 a.m. and 4:30 p.m.  Voicemails left after 4:00 p.m. will not be returned until the following business day.  For prescription refill requests, have your pharmacy contact our office and allow 72 hours.    Due to Covid, you will need to wear a mask upon entering the hospital. If you do not have a mask, a mask will be given to you at the Main Entrance upon arrival. For doctor visits, patients may have 1 support person with them. For treatment visits, patients can not have anyone with them due to social distancing guidelines and our immunocompromised population.      

## 2019-12-10 NOTE — Progress Notes (Signed)
Whitney Velez, Whitney Velez [id number]   CLINIC:  Medical Oncology/Hematology  PCP:  Mikey Kirschner, [place] [address] [phone]   REASON FOR VISIT: Follow-up for breast cancer  CURRENT THERAPY: Anastrozole  BRIEF ONCOLOGIC HISTORY:  Oncology History  Breast cancer, right ([place])  05/12/2018 Initial Diagnosis   Breast cancer, right ([place])   06/05/2018 - 10/30/2018 Chemotherapy   The patient had DOXOrubicin (ADRIAMYCIN) chemo injection 110 mg, 60 mg/m2 = 110 mg, Intravenous,  Once, 4 of 4 cycles Administration: 110 mg (06/05/2018), 110 mg (06/19/2018), 110 mg (07/07/2018), 110 mg (07/21/2018) palonosetron (ALOXI) injection 0.25 mg, 0.25 mg, Intravenous,  Once, 4 of 4 cycles Administration: 0.25 mg (06/05/2018), 0.25 mg (06/19/2018), 0.25 mg (07/07/2018), 0.25 mg (07/21/2018) pegfilgrastim-cbqv (UDENYCA) injection 6 mg, 6 mg, Subcutaneous, Once, 4 of 4 cycles Administration: 6 mg (06/06/2018), 6 mg (06/20/2018), 6 mg (07/08/2018), 6 mg (07/22/2018) cyclophosphamide (CYTOXAN) 1,100 mg in sodium chloride 0.9 % 250 mL chemo infusion, 600 mg/m2 = 1,100 mg, Intravenous,  Once, 4 of 4 cycles Administration: 1,100 mg (06/05/2018), 1,100 mg (06/19/2018), 1,100 mg (07/07/2018), 1,100 mg (07/21/2018) PACLitaxel (TAXOL) 144 mg in sodium chloride 0.9 % 250 mL chemo infusion (</= 87m/m2), 80 mg/m2 = 144 mg, Intravenous,  Once, 12 of 12 cycles Administration: 144 mg (08/07/2018), 144 mg (08/14/2018), 144 mg (08/21/2018), 144 mg (08/28/2018), 144 mg (09/04/2018), 144 mg (09/11/2018), 144 mg (09/18/2018), 144 mg (09/25/2018), 144 mg (10/02/2018), 144 mg (10/09/2018), 144 mg (10/17/2018), 144 mg (10/24/2018) fosaprepitant (EMEND) 150 mg, dexamethasone (DECADRON) 12 mg in sodium chloride 0.9 % 145 mL IVPB, , Intravenous,  Once, 4 of 4 cycles Administration:  (06/05/2018),  (06/19/2018),  (07/07/2018),  (07/21/2018)  for chemotherapy treatment.      INTERVAL HISTORY:  Ms.  [id number]. female returns for routine follow-up for breast cancer.  Patient reports she has been doing well since her last visit.  She is able to get up and walk around without any lower extremity weakness.  Her fatigue is also improved.  She denies any bright red bleeding per rectum or melena.  She denies any easy bruising or bleeding.  She denies any joint or musculoskeletal pain.  She does report occasional hot flashes. Denies any nausea, vomiting, or diarrhea. Denies any new pains. Had not noticed any recent bleeding such as epistaxis, hematuria or hematochezia. Denies recent chest pain on exertion, shortness of breath on minimal exertion, pre-syncopal episodes, or palpitations. Denies any numbness or tingling in hands or feet. Denies any recent fevers, infections, or recent hospitalizations. Patient reports appetite at 100% and energy level at 75%.  She is eating well maintaining her weight is time.    REVIEW OF SYSTEMS:  Review of Systems  Endocrine: Positive for hot flashes.  Neurological: Positive for numbness.  All other systems reviewed and are negative.    PAST MEDICAL/SURGICAL HISTORY:  Past Medical History:  Diagnosis Date  . Asthma   . Hypertension    Past Surgical History:  Procedure Laterality Date  . ABDOMINAL HYSTERECTOMY    . MASTECTOMY MODIFIED RADICAL Right 12/01/2018   Procedure: RIGHT MODIFIED RADICAL MASTECTOMY (Procedure #1);  Surgeon: JAviva Signs MD;  Location: [facility];  Service: General;  Laterality: Right;  . PORTACATH PLACEMENT Left 06/04/2018   Procedure: INSERTION PORT-A-CATH;  Surgeon: JAviva Signs MD;  Location: [facility];  Service: General;  Laterality: Left;  . SIMPLE MASTECTOMY WITH AXILLARY SENTINEL NODE BIOPSY Left 12/01/2018  Procedure: LEFT SIMPLE MASTECTOMY (Procedure #2);  Surgeon: Aviva Signs, MD;  Location: [facility];  Service: General;  Laterality: Left;     SOCIAL HISTORY:  Social History   Socioeconomic History  . Marital status:  Widowed    Spouse name: Not on file  . Number of children: Not on file  . Years of education: Not on file  . Highest education level: Not on file  Occupational History  . Not on file  Tobacco Use  . Smoking status: Never Smoker  . Smokeless tobacco: Never Used  Substance and Sexual Activity  . Alcohol use: Never  . Drug use: Never  . Sexual activity: Not on file  Other Topics Concern  . Not on file  Social History Narrative  . Not on file   Social Determinants of Health   Financial Resource Strain:   . Difficulty of Paying Living Expenses: Not on file  Food Insecurity:   . Worried About Charity fundraiser in the Last Year: Not on file  . Ran Out of Food in the Last Year: Not on file  Transportation Needs:   . Lack of Transportation (Medical): Not on file  . Lack of Transportation (Non-Medical): Not on file  Physical Activity:   . Days of Exercise per Week: Not on file  . Minutes of Exercise per Session: Not on file  Stress:   . Feeling of Stress : Not on file  Social Connections:   . Frequency of Communication with Friends and Family: Not on file  . Frequency of Social Gatherings with Friends and Family: Not on file  . Attends Religious Services: Not on file  . Active Member of Clubs or Organizations: Not on file  . Attends Archivist Meetings: Not on file  . Marital Status: Not on file  Intimate Partner Violence:   . Fear of Current or Ex-Partner: Not on file  . Emotionally Abused: Not on file  . Physically Abused: Not on file  . Sexually Abused: Not on file    FAMILY HISTORY:  History reviewed. No pertinent family history.  CURRENT MEDICATIONS:  Outpatient Encounter Medications as of 12/10/2019  Medication Sig  . amLODipine (NORVASC) 5 MG tablet Take one daily  . anastrozole (ARIMIDEX) 1 MG tablet TAKE ONE (1) TABLET BY MOUTH EVERY DAY  . aspirin 81 MG tablet Take 81 mg by mouth daily.  . Calcium Carb-Cholecalciferol (CALCIUM + VITAMIN D3 PO) Take  1 tablet by mouth 2 (two) times daily.  Marland Kitchen denosumab (PROLIA) 60 MG/ML SOSY injection Inject 60 mg into the skin every 6 (six) months.  . Flaxseed, Linseed, (FLAXSEED OIL) 1000 MG CAPS Take 1,000 mg by mouth daily.   . montelukast (SINGULAIR) 10 MG tablet TAKE ONE TABLET (10MG TOTAL) BY MOUTH ATBEDTIME  . potassium chloride SA (K-DUR) 20 MEQ tablet Take 1 tablet (20 mEq total) by mouth 2 (two) times daily.  Marland Kitchen albuterol (PROAIR HFA) 108 (90 Base) MCG/ACT inhaler INHALE TWO PUFFS INTO THE LUNGS EVERY SIX HOURS AS NEEDED FOR WHEEZING (Patient not taking: Reported on 09/10/2019)  . albuterol (PROVENTIL) (2.5 MG/3ML) 0.083% nebulizer solution Use via neb q 4 hrs prn wheezing (Patient not taking: Reported on 09/10/2019)  . ibuprofen (ADVIL) 200 MG tablet Take 200 mg by mouth at bedtime as needed.  . lactulose (CHRONULAC) 10 GM/15ML solution Take 30 mLs (20 g total) by mouth daily. (Patient not taking: Reported on 07/16/2019)  . lidocaine-prilocaine (EMLA) cream Apply to affected area once (Patient  not taking: Reported on 09/10/2019)  . traMADol (ULTRAM) 50 MG tablet Take 1 tablet (50 mg total) by mouth every 6 (six) hours as needed. (Patient not taking: Reported on 09/10/2019)  . triamcinolone cream (KENALOG) 0.1 % Apply twice daily to affected areas. (Patient not taking: Reported on 09/10/2019)   Facility-Administered Encounter Medications as of 12/10/2019  Medication  . heparin lock flush 100 unit/mL  . sodium chloride flush (NS) 0.9 % injection 10 mL    ALLERGIES:  Allergies  Allergen Reactions  . Penicillins Other (See Comments)    Nausea, vomiting, dizziness Has patient had a PCN reaction causing immediate rash, facial/tongue/throat swelling, SOB or lightheadedness with hypotension: no Has patient had a PCN reaction causing severe rash involving mucus membranes or skin necrosis: no Has patient had a PCN reaction that required hospitalization: no Has patient had a PCN reaction occurring within the  last 10 years: no If all of the above answers are "NO", then may proceed with Cephalosporin use.   Marland Kitchen Percocet [Oxycodone-Acetaminophen] Diarrhea and Nausea And Vomiting  . Ranitidine Itching     PHYSICAL EXAM:  ECOG Performance status: 1  Vitals:   12/10/19 1046  BP: (!) 133/105  Pulse: 62  Resp: 18  Temp: 98.4 F (36.9 C)  SpO2: 100%   Filed Weights   12/10/19 1046  Weight: 166 lb 6.4 oz (75.5 kg)    Physical Exam Constitutional:      Appearance: She is obese.  Cardiovascular:     Rate and Rhythm: Normal rate and regular rhythm.     Heart sounds: Normal heart sounds.  Pulmonary:     Effort: Pulmonary effort is normal.     Breath sounds: Normal breath sounds.  Abdominal:     General: Bowel sounds are normal.     Palpations: Abdomen is soft.  Musculoskeletal:        General: Normal range of motion.  Skin:    General: Skin is warm.  Neurological:     Mental Status: She is alert and oriented to person, place, and time. Mental status is at baseline.  Psychiatric:        Mood and Affect: Mood normal.        Behavior: Behavior normal.        Thought Content: Thought content normal.        Judgment: Judgment normal.      LABORATORY DATA:  I have reviewed the labs as listed.  CBC    Component Value Date/Time   WBC 7.6 12/03/2019 1048   RBC 4.43 12/03/2019 1048   HGB 13.0 12/03/2019 1048   HGB 12.4 02/07/2017 1109   HCT 39.7 12/03/2019 1048   HCT 38.2 02/07/2017 1109   PLT 262 12/03/2019 1048   PLT 335 02/07/2017 1109   MCV 89.6 12/03/2019 1048   MCV 86 02/07/2017 1109   MCH 29.3 12/03/2019 1048   MCHC 32.7 12/03/2019 1048   RDW 13.8 12/03/2019 1048   RDW 15.1 02/07/2017 1109   LYMPHSABS 1.6 12/03/2019 1048   LYMPHSABS 3.0 02/07/2017 1109   MONOABS 1.1 (H) 12/03/2019 1048   EOSABS 0.9 (H) 12/03/2019 1048   EOSABS 1.1 (H) 02/07/2017 1109   BASOSABS 0.0 12/03/2019 1048   BASOSABS 0.0 02/07/2017 1109   CMP Latest Ref Rng & Units 12/03/2019 09/03/2019  05/01/2019  Glucose 70 - 99 mg/dL 80 95 101(H)  BUN 8 - 23 mg/dL _0 Creatinine 0.44 - 1.00 mg/dL 1.01(H) 0.97 0.91  Sodium 135 -  145 mmol/L 135 137 137  Potassium 3.5 - 5.1 mmol/L 4.2 4.3 3.6  Chloride 98 - 111 mmol/L 100 103 102  CO2 22 - 32 mmol/L _0 Calcium 8.9 - 10.3 mg/dL 9.7 9.6 9.6  Total Protein 6.5 - 8.1 g/dL 7.7 6.8 7.5  Total Bilirubin 0.3 - 1.2 mg/dL 0.5 0.2(L) 0.5  Alkaline Phos 38 - 126 U/L 70 87 82  AST 15 - 41 U/L _1 ALT 0 - 44 U/L _2 I personally performed a face-to-face visit.  All questions were answered to patient's stated satisfaction. Encouraged patient to call with any new concerns or questions before his next visit to the cancer center and we can certain see him sooner, if needed.     ASSESSMENT & PLAN:   Breast cancer, right ([place]) 1.  Locally advanced right breast cancer, stage IIIa: - She had a mammogram on 03/28/2018's that was B RADS category 0. - She had a diagnostic mammogram and ultrasound on 04/22/2018 that showed a 5.9 x 5 x 6 cm mass extending in the upper right quadrant of the right breast and right axillary lymph nodes measuring 2.8 x 1.6 x 1.7 cm.  Left breast has retroareolar mass. - Patient did not have mammogram in the last 20 years. - Biopsy on 04/29/2018 of the right breast mass was consistent with IDC, grade 2-3, lymph node biopsy positive for metastatic disease, ER/PR positive, HER-2 negative, Ki-67 of 5%. - She was seen by Dr. Arnoldo Morale who recommended right modified radical mastectomy and left simple mastectomy. - Bone scan showed increased uptake in the right greater trochanter and right sacroiliac joint. - CT scan of the chest abdomen pelvis did not show any evidence of metastatic disease.  The uptake in the right greater trochanter was thought to be due to osteoporotic changes.  Uptake in the right sacroiliac joint was likely due from degenerative changes at the right L5-S1 joint. - Neoadjuvant chemotherapy with  dose dense AC followed by weekly paclitaxel for 12 weeks. - 4 cycles of dose dense AC from 06/05/2018 through 07/21/2018. -12 cycles of weekly paclitaxel from 08/07/2018 through 10/23/2018. -Anastrozole was started on 11/07/2018 -Right modified radical mastectomy on 12/01/2018 with left simple mastectomy. 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 - Receptor markers were ordered and ER was positive, PR was negative, and HER-2 was negative.  Ki 67 was less than 1%. - She is tolerating anastrozole very well.  Her lower extremity strength has improved after physical therapy and she is able to walk without a walker now. -She had a PET CT scan on 01/05/2019 which did not show any recurrent or metastatic disease. -Labs done on 12/03/2019 showed WBC 7.6, hemoglobin 13.0, platelets 262, LDH 169, potassium 4.2, creatinine 1.01. -She will follow-up in 4 months with repeat labs.  2.  Left breast DCIS: -Biopsy on 04/29/2018 of the left breast showed DCIS, ER/PR positive.  Left axillary lymph node biopsy was negative for malignancy. -Left simple mastectomy on 12/01/2018 showed 7 mm DCIS, intermediate grade, with cribriform, papillary and cystic morphology.  No evidence of invasion. - Bilateral mastectomy sites were well-healed no adenopathy or masses present.  3.  Hypokalemia: - Labs on 12/03/2019 showed potassium 4.2 -She will continue taking her potassium supplements at  this time.  4.  Osteopenia: - Last DEXA scan on 11/13/2018 showed T score of -0.8. -She is taking calcium and vitamin D twice daily. -I have recommended Prolia every 6 months. -She has recently been cleared by the dentist and had teeth pulled. - We will give her her first Prolia injection was on 09/10/2019       Orders placed this  encounter:  Orders Placed This Encounter  Procedures  . Lactate dehydrogenase  . CBC with Differential/Platelet  . Comprehensive metabolic panel  . Vitamin D 25 hydroxy  . Vitamin B12     Francene Finders, FNP-C [facility] [phone]

## 2019-12-10 NOTE — Progress Notes (Unsigned)
Whitney Velez presented for Portacath access and flush. Portacath located in the left chest wall accessed with  H 20 needle. Clean, Dry and Intact Good blood return present. Portacath flushed with 53ml NS and 500U/71ml Heparin per protocol and needle removed intact. Procedure without incident. Patient tolerated procedure well.

## 2020-02-04 ENCOUNTER — Encounter: Payer: Self-pay | Admitting: Family Medicine

## 2020-02-16 ENCOUNTER — Other Ambulatory Visit: Payer: Self-pay

## 2020-02-16 ENCOUNTER — Ambulatory Visit (INDEPENDENT_AMBULATORY_CARE_PROVIDER_SITE_OTHER): Payer: Medicare Other | Admitting: Family Medicine

## 2020-02-16 DIAGNOSIS — E119 Type 2 diabetes mellitus without complications: Secondary | ICD-10-CM | POA: Diagnosis not present

## 2020-02-16 DIAGNOSIS — I10 Essential (primary) hypertension: Secondary | ICD-10-CM | POA: Diagnosis not present

## 2020-02-16 DIAGNOSIS — Z13220 Encounter for screening for lipoid disorders: Secondary | ICD-10-CM | POA: Diagnosis not present

## 2020-02-16 DIAGNOSIS — J683 Other acute and subacute respiratory conditions due to chemicals, gases, fumes and vapors: Secondary | ICD-10-CM

## 2020-02-16 MED ORDER — AMLODIPINE BESYLATE 5 MG PO TABS: ORAL_TABLET | ORAL | 1 refills | Status: DC

## 2020-02-16 NOTE — Progress Notes (Signed)
   Subjective:  Audio  Patient ID: Whitney Velez, female    DOB: Apr 09, 1950, 70 y.o.   MRN: [id number]  Hypertension This is a chronic problem. There are no compliance problems (takes meds every day, eats healthy, walks dogs for exercise).    Abdominal pain. Sharp pain that last for about 5 -10 minutes. Happens about once a day. Started 3 -4 weeks ago. Sitting still helps the pain.   Virtual Visit via Telephone Note  I connected with Whitney Velez on 02/16/20 at  2:30 PM EST by telephone and verified that I am speaking with the correct person using two identifiers.  Location: Patient: home Provider: office   I discussed the limitations, risks, security and privacy concerns of performing an evaluation and management service by telephone and the availability of in person appointments. I also discussed with the patient that there may be a patient responsible charge related to this service. The patient expressed understanding and agreed to proceed.   History of Present Illness:    Observations/Objective:   Assessment and Plan:   Follow Up Instructions:    I discussed the assessment and treatment plan with the patient. The patient was provided an opportunity to ask questions and all were answered. The patient agreed with the plan and demonstrated an understanding of the instructions.   The patient was advised to call back or seek an in-person evaluation if the symptoms worsen or if the condition fails to improve as anticipated.  I provided 30 minutes of non-face-to-face time during this encounter.  Blood pressure medicine and blood pressure levels reviewed today with patient. Compliant with blood pressure medicine. States does not miss a dose. No obvious side effects. Blood pressure generally good when checked elsewhere. Watching salt intake.   reac airways.  Overall stable.  Patient continues to take lipid medication regularly. No obvious side effects from it. Generally does  not miss a dose. Prior blood work results are reviewed with patient. Patient continues to work on fat intake in diet  Abdominal pain is quick.  Transient.  Nonspecific.  Resolves quickly.  No fever no chills.  Ongoing good appetite.    Review of Systems No headache no chest pain no shortness of breath    Objective:   Physical Exam   Virtual     Assessment & Plan:  Impression 1 type 2 diabetes exact status uncertain discussed need A1c  2.  Hypertension blood pressure good when checked elsewhere patient maintain same meds  3.  Reactive airways clinically stable  4.  Abdominal pain very nonspecific discussed to maintain vigilance if worsens come in for evaluation  5.  Hyperlipidemia exact control uncertain recommend blood work  Maintain on meds diet exercise discussed follow-up in 6 months further recommendations based on blood work

## 2020-02-17 ENCOUNTER — Other Ambulatory Visit: Payer: Self-pay | Admitting: Family Medicine

## 2020-02-19 ENCOUNTER — Other Ambulatory Visit: Payer: Self-pay

## 2020-02-19 MED ORDER — MONTELUKAST SODIUM 10 MG PO TABS: ORAL_TABLET | ORAL | 3 refills | Status: DC

## 2020-02-19 MED ORDER — ALBUTEROL SULFATE HFA 108 (90 BASE) MCG/ACT IN AERS: INHALATION_SPRAY | RESPIRATORY_TRACT | 5 refills | Status: DC

## 2020-02-19 NOTE — Telephone Encounter (Signed)
Med check up 02/16/20

## 2020-02-25 ENCOUNTER — Inpatient Hospital Stay (HOSPITAL_COMMUNITY): Payer: Medicare Other | Attending: Hematology

## 2020-02-25 ENCOUNTER — Other Ambulatory Visit: Payer: Self-pay

## 2020-02-25 DIAGNOSIS — E876 Hypokalemia: Secondary | ICD-10-CM | POA: Insufficient documentation

## 2020-02-25 DIAGNOSIS — M8580 Other specified disorders of bone density and structure, unspecified site: Secondary | ICD-10-CM | POA: Diagnosis not present

## 2020-02-25 DIAGNOSIS — Z9013 Acquired absence of bilateral breasts and nipples: Secondary | ICD-10-CM | POA: Insufficient documentation

## 2020-02-25 DIAGNOSIS — Z79899 Other long term (current) drug therapy: Secondary | ICD-10-CM | POA: Insufficient documentation

## 2020-02-25 DIAGNOSIS — C773 Secondary and unspecified malignant neoplasm of axilla and upper limb lymph nodes: Secondary | ICD-10-CM | POA: Diagnosis not present

## 2020-02-25 DIAGNOSIS — C50811 Malignant neoplasm of overlapping sites of right female breast: Secondary | ICD-10-CM

## 2020-02-25 DIAGNOSIS — Z170 Estrogen receptor positive status [ER+]: Secondary | ICD-10-CM | POA: Insufficient documentation

## 2020-02-25 DIAGNOSIS — I10 Essential (primary) hypertension: Secondary | ICD-10-CM | POA: Insufficient documentation

## 2020-02-25 DIAGNOSIS — Z79811 Long term (current) use of aromatase inhibitors: Secondary | ICD-10-CM | POA: Diagnosis not present

## 2020-02-25 DIAGNOSIS — Z7982 Long term (current) use of aspirin: Secondary | ICD-10-CM | POA: Insufficient documentation

## 2020-02-25 DIAGNOSIS — Z9221 Personal history of antineoplastic chemotherapy: Secondary | ICD-10-CM | POA: Insufficient documentation

## 2020-02-25 LAB — COMPREHENSIVE METABOLIC PANEL
ALT: 13 U/L (ref 0–44)
AST: 21 U/L (ref 15–41)
Albumin: 4 g/dL (ref 3.5–5.0)
Alkaline Phosphatase: 67 U/L (ref 38–126)
Anion gap: 10 (ref 5–15)
BUN: 16 mg/dL (ref 8–23)
CO2: 23 mmol/L (ref 22–32)
Calcium: 10.6 mg/dL — ABNORMAL HIGH (ref 8.9–10.3)
Chloride: 103 mmol/L (ref 98–111)
Creatinine, Ser: 1.14 mg/dL — ABNORMAL HIGH (ref 0.44–1.00)
GFR calc Af Amer: 56 mL/min — ABNORMAL LOW (ref >60)
GFR calc non Af Amer: 48 mL/min — ABNORMAL LOW (ref >60)
Glucose, Bld: 96 mg/dL (ref 70–99)
Potassium: 4 mmol/L (ref 3.5–5.1)
Sodium: 136 mmol/L (ref 135–145)
Total Bilirubin: 0.5 mg/dL (ref 0.3–1.2)
Total Protein: 7.7 g/dL (ref 6.5–8.1)

## 2020-02-25 LAB — CBC WITH DIFFERENTIAL/PLATELET
Abs Immature Granulocytes: 0.04 10*3/uL (ref 0.00–0.07)
Basophils Absolute: 0.1 10*3/uL (ref 0.0–0.1)
Basophils Relative: 1 %
Eosinophils Absolute: 1.5 10*3/uL — ABNORMAL HIGH (ref 0.0–0.5)
Eosinophils Relative: 16 %
HCT: 39.3 % (ref 36.0–46.0)
Hemoglobin: 12.8 g/dL (ref 12.0–15.0)
Immature Granulocytes: 0 %
Lymphocytes Relative: 17 %
Lymphs Abs: 1.5 10*3/uL (ref 0.7–4.0)
MCH: 29.2 pg (ref 26.0–34.0)
MCHC: 32.6 g/dL (ref 30.0–36.0)
MCV: 89.5 fL (ref 80.0–100.0)
Monocytes Absolute: 1 10*3/uL (ref 0.1–1.0)
Monocytes Relative: 11 %
Neutro Abs: 5.1 10*3/uL (ref 1.7–7.7)
Neutrophils Relative %: 55 %
Platelets: 266 10*3/uL (ref 150–400)
RBC: 4.39 MIL/uL (ref 3.87–5.11)
RDW: 14.6 % (ref 11.5–15.5)
WBC: 9.2 10*3/uL (ref 4.0–10.5)
nRBC: 0 % (ref 0.0–0.2)

## 2020-02-25 LAB — IRON AND TIBC
Iron: 47 ug/dL (ref 28–170)
Saturation Ratios: 11 % (ref 10.4–31.8)
TIBC: 433 ug/dL (ref 250–450)
UIBC: 386 ug/dL

## 2020-02-25 LAB — FERRITIN: Ferritin: 27 ng/mL (ref 11–307)

## 2020-02-25 LAB — LACTATE DEHYDROGENASE: LDH: 161 U/L (ref 98–192)

## 2020-02-25 LAB — VITAMIN D 25 HYDROXY (VIT D DEFICIENCY, FRACTURES): Vit D, 25-Hydroxy: 38.65 ng/mL (ref 30–100)

## 2020-02-25 LAB — VITAMIN B12: Vitamin B-12: 680 pg/mL (ref 180–914)

## 2020-03-03 ENCOUNTER — Inpatient Hospital Stay (HOSPITAL_COMMUNITY): Payer: Medicare Other | Attending: Hematology | Admitting: Nurse Practitioner

## 2020-03-03 ENCOUNTER — Other Ambulatory Visit: Payer: Self-pay

## 2020-03-03 ENCOUNTER — Inpatient Hospital Stay (HOSPITAL_COMMUNITY): Payer: Medicare Other

## 2020-03-03 ENCOUNTER — Encounter (HOSPITAL_COMMUNITY): Payer: Self-pay | Admitting: Nurse Practitioner

## 2020-03-03 DIAGNOSIS — I10 Essential (primary) hypertension: Secondary | ICD-10-CM | POA: Insufficient documentation

## 2020-03-03 DIAGNOSIS — Z791 Long term (current) use of non-steroidal anti-inflammatories (NSAID): Secondary | ICD-10-CM | POA: Diagnosis not present

## 2020-03-03 DIAGNOSIS — Z90710 Acquired absence of both cervix and uterus: Secondary | ICD-10-CM | POA: Insufficient documentation

## 2020-03-03 DIAGNOSIS — M8580 Other specified disorders of bone density and structure, unspecified site: Secondary | ICD-10-CM

## 2020-03-03 DIAGNOSIS — Z9011 Acquired absence of right breast and nipple: Secondary | ICD-10-CM | POA: Insufficient documentation

## 2020-03-03 DIAGNOSIS — C50811 Malignant neoplasm of overlapping sites of right female breast: Secondary | ICD-10-CM

## 2020-03-03 DIAGNOSIS — E876 Hypokalemia: Secondary | ICD-10-CM | POA: Insufficient documentation

## 2020-03-03 DIAGNOSIS — Z7982 Long term (current) use of aspirin: Secondary | ICD-10-CM | POA: Diagnosis not present

## 2020-03-03 DIAGNOSIS — Z79899 Other long term (current) drug therapy: Secondary | ICD-10-CM | POA: Insufficient documentation

## 2020-03-03 DIAGNOSIS — J45909 Unspecified asthma, uncomplicated: Secondary | ICD-10-CM | POA: Insufficient documentation

## 2020-03-03 DIAGNOSIS — C50911 Malignant neoplasm of unspecified site of right female breast: Secondary | ICD-10-CM | POA: Insufficient documentation

## 2020-03-03 DIAGNOSIS — Z9221 Personal history of antineoplastic chemotherapy: Secondary | ICD-10-CM | POA: Insufficient documentation

## 2020-03-03 DIAGNOSIS — Z79811 Long term (current) use of aromatase inhibitors: Secondary | ICD-10-CM | POA: Diagnosis not present

## 2020-03-03 DIAGNOSIS — Z170 Estrogen receptor positive status [ER+]: Secondary | ICD-10-CM | POA: Diagnosis not present

## 2020-03-03 MED ORDER — DENOSUMAB 60 MG/ML ~~LOC~~ SOSY
60.0000 mg | PREFILLED_SYRINGE | Freq: Once | SUBCUTANEOUS | Status: AC
  Administered 2020-03-03: 60 mg via SUBCUTANEOUS

## 2020-03-03 NOTE — Patient Instructions (Signed)
[place] at [facility]  Discharge Instructions:  Prolia injection received today. _______________________________________________________________  Thank you for choosing [facility] at [facility] to provide your oncology and hematology care.  To afford each patient quality time with our providers, please arrive at least 15 minutes before your scheduled appointment.  You need to re-schedule your appointment if you arrive 10 or more minutes late.  We strive to give you quality time with our providers, and arriving late affects you and other patients whose appointments are after yours.  Also, if you no show three or more times for appointments you may be dismissed from the clinic.  Again, thank you for choosing [facility] at [facility] hope is that these requests will allow you access to exceptional care and in a timely manner. _______________________________________________________________  If you have questions after your visit, please contact our office at [phone] between the hours of 8:30 a.m. and 5:00 p.m. Voicemails left after 4:30 p.m. will not be returned until the following business day. _______________________________________________________________  For prescription refill requests, have your pharmacy contact our office. _______________________________________________________________  Recommendations made by the consultant and any test results will be sent to your referring physician. _______________________________________________________________

## 2020-03-03 NOTE — Assessment & Plan Note (Addendum)
1.  Locally advanced right breast cancer, stage IIIa: - She had a mammogram on 03/28/2018's that was B RADS category 0. - She had a diagnostic mammogram and ultrasound on 04/22/2018 that showed a 5.9 x 5 x 6 cm mass extending in the upper right quadrant of the right breast and right axillary lymph nodes measuring 2.8 x 1.6 x 1.7 cm.  Left breast has retroareolar mass. - Patient did not have mammogram in the last 20 years. - Biopsy on 04/29/2018 of the right breast mass was consistent with IDC, grade 2-3, lymph node biopsy positive for metastatic disease, ER/PR positive, HER-2 negative, Ki-67 of 5%. - She was seen by Dr. Arnoldo Morale who recommended right modified radical mastectomy and left simple mastectomy. - Bone scan showed increased uptake in the right greater trochanter and right sacroiliac joint. - CT scan of the chest abdomen pelvis did not show any evidence of metastatic disease.  The uptake in the right greater trochanter was thought to be due to osteoporotic changes.  Uptake in the right sacroiliac joint was likely due from degenerative changes at the right L5-S1 joint. - Neoadjuvant chemotherapy with dose dense AC followed by weekly paclitaxel for 12 weeks. - 4 cycles of dose dense AC from 06/05/2018 through 07/21/2018. -12 cycles of weekly paclitaxel from 08/07/2018 through 10/23/2018. -Anastrozole was started on 11/07/2018 -Right modified radical mastectomy on 12/01/2018 with left simple mastectomy. 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 - Receptor markers were ordered and ER was positive, PR was negative, and HER-2 was negative.  Ki 67 was less than 1%. - She is tolerating anastrozole very well.  Her lower extremity strength has improved after physical therapy and she is able to walk  without a walker now. -She had a PET CT scan on 01/05/2019 which did not show any recurrent or metastatic disease. -Labs done on 02/25/2020 showed WBC 9.2, hemoglobin 12.8, platelets 266, LDH 161-, potassium 4.9, creatinine 1.14. -She will follow-up in 6 months with repeat labs.  2.  Left breast DCIS: -Biopsy on 04/29/2018 of the left breast showed DCIS, ER/PR positive.  Left axillary lymph node biopsy was negative for malignancy. -Left simple mastectomy on 12/01/2018 showed 7 mm DCIS, intermediate grade, with cribriform, papillary and cystic morphology.  No evidence of invasion. - Bilateral mastectomy sites were well-healed no adenopathy or masses present.  3.  Hypokalemia: - Labs on 02/25/2020 showed potassium 4.0 -She will continue taking her potassium supplements at this time.  4.  Osteopenia: - Last DEXA scan on 11/13/2018 showed T score of -0.8. -She is taking calcium and vitamin D twice daily. -I have recommended Prolia every 6 months. -She has recently been cleared by the dentist and had teeth pulled. - We will give her her first Prolia injection was on 09/10/2019 -She will get her second Prolia injection today on 03/03/2020.

## 2020-03-03 NOTE — Progress Notes (Signed)
Whitney Velez presents today for denosumab injection. Lab work reviewed prior to administration. VSS. Pt reports taking Ca and Vit D as instructed. Pt denies tooth/jaw pain and denies recent or future invasive dental work. Injection tolerated well, see MAR for details. Site clean and dry, band aid applied. Pt discharged in satisfactory condition with follow up instructions.

## 2020-03-03 NOTE — Progress Notes (Signed)
[employer] [place], [place] [address]   CLINIC:  Medical Oncology/Hematology  PCP:  Mikey Kirschner, [place] [address] [phone]   REASON FOR VISIT: Follow-up for breast cancer  CURRENT THERAPY: Anastrozole  BRIEF ONCOLOGIC HISTORY:  Oncology History  Breast cancer, right ([place])  05/12/2018 Initial Diagnosis   Breast cancer, right ([place])   06/05/2018 - 10/30/2018 Chemotherapy   The patient had DOXOrubicin (ADRIAMYCIN) chemo injection 110 mg, 60 mg/m2 = 110 mg, Intravenous,  Once, 4 of 4 cycles Administration: 110 mg (06/05/2018), 110 mg (06/19/2018), 110 mg (07/07/2018), 110 mg (07/21/2018) palonosetron (ALOXI) injection 0.25 mg, 0.25 mg, Intravenous,  Once, 4 of 4 cycles Administration: 0.25 mg (06/05/2018), 0.25 mg (06/19/2018), 0.25 mg (07/07/2018), 0.25 mg (07/21/2018) pegfilgrastim-cbqv (UDENYCA) injection 6 mg, 6 mg, Subcutaneous, Once, 4 of 4 cycles Administration: 6 mg (06/06/2018), 6 mg (06/20/2018), 6 mg (07/08/2018), 6 mg (07/22/2018) cyclophosphamide (CYTOXAN) 1,100 mg in sodium chloride 0.9 % 250 mL chemo infusion, 600 mg/m2 = 1,100 mg, Intravenous,  Once, 4 of 4 cycles Administration: 1,100 mg (06/05/2018), 1,100 mg (06/19/2018), 1,100 mg (07/07/2018), 1,100 mg (07/21/2018) PACLitaxel (TAXOL) 144 mg in sodium chloride 0.9 % 250 mL chemo infusion (</= 55m/m2), 80 mg/m2 = 144 mg, Intravenous,  Once, 12 of 12 cycles Administration: 144 mg (08/07/2018), 144 mg (08/14/2018), 144 mg (08/21/2018), 144 mg (08/28/2018), 144 mg (09/04/2018), 144 mg (09/11/2018), 144 mg (09/18/2018), 144 mg (09/25/2018), 144 mg (10/02/2018), 144 mg (10/09/2018), 144 mg (10/17/2018), 144 mg (10/24/2018) fosaprepitant (EMEND) 150 mg, dexamethasone (DECADRON) 12 mg in sodium chloride 0.9 % 145 mL IVPB, , Intravenous,  Once, 4 of 4 cycles Administration:  (06/05/2018),  (06/19/2018),  (07/07/2018),  (07/21/2018)  for chemotherapy treatment.       INTERVAL HISTORY:  Ms.  TDeanda89y.o. female returns for routine follow-up for breast cancer.  Patient reports she is doing well since her last visit.  She denies any new lumps or bumps.  She denies any bright red bleeding per rectum or melena.  She denies any easy bruising or bleeding.  She denies any new bone pain.  She does report occasional joint pain in her fingers. Denies any nausea, vomiting, or diarrhea. Denies any new pains. Had not noticed any recent bleeding such as epistaxis, hematuria or hematochezia. Denies recent chest pain on exertion, shortness of breath on minimal exertion, pre-syncopal episodes, or palpitations. Denies any numbness or tingling in hands or feet. Denies any recent fevers, infections, or recent hospitalizations. Patient reports appetite at 100% and energy level at 100%.  She is eating well maintaining her weight at this time.     REVIEW OF SYSTEMS:  Review of Systems  Neurological: Positive for numbness.  All other systems reviewed and are negative.    PAST MEDICAL/SURGICAL HISTORY:  Past Medical History:  Diagnosis Date   Asthma    Hypertension    Past Surgical History:  Procedure Laterality Date   ABDOMINAL HYSTERECTOMY     MASTECTOMY MODIFIED RADICAL Right 12/01/2018   Procedure: RIGHT MODIFIED RADICAL MASTECTOMY (Procedure #1);  Surgeon: JAviva Signs MD;  Location: [facility];  Service: General;  Laterality: Right;   PORTACATH PLACEMENT Left 06/04/2018   Procedure: INSERTION PORT-A-CATH;  Surgeon: JAviva Signs MD;  Location: [facility];  Service: General;  Laterality: Left;   SIMPLE MASTECTOMY WITH AXILLARY SENTINEL NODE BIOPSY Left 12/01/2018   Procedure: LEFT SIMPLE MASTECTOMY (Procedure #2);  Surgeon: JAviva Signs MD;  Location: [facility];  Service: General;  Laterality: Left;     SOCIAL HISTORY:  Social History   Socioeconomic History   Marital status: Widowed    Spouse name: Not on file   Number of children: Not on file   Years of education: Not on file    Highest education level: Not on file  Occupational History   Not on file  Tobacco Use   Smoking status: Never Smoker   Smokeless tobacco: Never Used  Substance and Sexual Activity   Alcohol use: Never   Drug use: Never   Sexual activity: Not on file  Other Topics Concern   Not on file  Social History Narrative   Not on file   Social Determinants of Health   Financial Resource Strain:    Difficulty of Paying Living Expenses: Not on file  Food Insecurity:    Worried About Ball in the Last Year: Not on file   Ran Out of Food in the Last Year: Not on file  Transportation Needs:    Lack of Transportation (Medical): Not on file   Lack of Transportation (Non-Medical): Not on file  Physical Activity:    Days of Exercise per Week: Not on file   Minutes of Exercise per Session: Not on file  Stress:    Feeling of Stress : Not on file  Social Connections:    Frequency of Communication with Friends and Family: Not on file   Frequency of Social Gatherings with Friends and Family: Not on file   Attends Religious Services: Not on file   Active Member of Clubs or Organizations: Not on file   Attends Archivist Meetings: Not on file   Marital Status: Not on file  Intimate Partner Violence:    Fear of Current or Ex-Partner: Not on file   Emotionally Abused: Not on file   Physically Abused: Not on file   Sexually Abused: Not on file    FAMILY HISTORY:  History reviewed. No pertinent family history.  CURRENT MEDICATIONS:  Outpatient Encounter Medications as of 03/03/2020  Medication Sig   albuterol (PROAIR HFA) 108 (90 Base) MCG/ACT inhaler INHALE TWO PUFFS INTO THE LUNGS EVERY SIX HOURS AS NEEDED FOR WHEEZING   albuterol (VENTOLIN HFA) 108 (90 Base) MCG/ACT inhaler INHALE TWO PUFFS INTO THE LUNGS EVERY SIX HOURS AS NEEDED FOR WHEEZING   amLODipine (NORVASC) 5 MG tablet Take one daily   anastrozole (ARIMIDEX) 1 MG tablet TAKE ONE  (1) TABLET BY MOUTH EVERY DAY   aspirin 81 MG tablet Take 81 mg by mouth daily.   Calcium Carb-Cholecalciferol (CALCIUM + VITAMIN D3 PO) Take 1 tablet by mouth 2 (two) times daily.   denosumab (PROLIA) 60 MG/ML SOSY injection Inject 60 mg into the skin every 6 (six) months.   Flaxseed, Linseed, (FLAXSEED OIL) 1000 MG CAPS Take 1,000 mg by mouth daily.    ibuprofen (ADVIL) 200 MG tablet Take 200 mg by mouth at bedtime as needed.   montelukast (SINGULAIR) 10 MG tablet TAKE ONE TABLET (10MG TOTAL) BY MOUTH ATBEDTIME   potassium chloride SA (K-DUR) 20 MEQ tablet Take 1 tablet (20 mEq total) by mouth 2 (two) times daily.   No facility-administered encounter medications on file as of 03/03/2020.    ALLERGIES:  Allergies  Allergen Reactions   Penicillins Other (See Comments)    Nausea, vomiting, dizziness Has patient had a PCN reaction causing immediate rash, facial/tongue/throat swelling, SOB or lightheadedness with hypotension: no Has patient had a  PCN reaction causing severe rash involving mucus membranes or skin necrosis: no Has patient had a PCN reaction that required hospitalization: no Has patient had a PCN reaction occurring within the last 10 years: no If all of the above answers are "NO", then may proceed with Cephalosporin use.    Percocet [Oxycodone-Acetaminophen] Diarrhea and Nausea And Vomiting   Ranitidine Itching     PHYSICAL EXAM:  ECOG Performance status: 1  Vitals:   03/03/20 1100  BP: 130/72  Pulse: 60  Resp: 16  Temp: (!) 97.1 F (36.2 C)  SpO2: 100%   Filed Weights   03/03/20 1100  Weight: 170 lb 2 oz (77.2 kg)    Physical Exam Constitutional:      Appearance: Normal appearance. She is normal weight.  Cardiovascular:     Rate and Rhythm: Normal rate and regular rhythm.     Heart sounds: Normal heart sounds.  Pulmonary:     Effort: Pulmonary effort is normal.     Breath sounds: Normal breath sounds.  Abdominal:     General: Bowel sounds are  normal.     Palpations: Abdomen is soft.  Musculoskeletal:        General: Normal range of motion.  Skin:    General: Skin is warm and dry.  Neurological:     Mental Status: She is alert. Mental status is at baseline. She is disoriented.  Psychiatric:        Mood and Affect: Mood normal.        Behavior: Behavior normal.        Thought Content: Thought content normal.        Judgment: Judgment normal.   Breast: No palpable masses, no skin changes and no adenopathy.   LABORATORY DATA:  I have reviewed the labs as listed.  CBC    Component Value Date/Time   WBC 9.2 02/25/2020 1112   RBC 4.39 02/25/2020 1112   HGB 12.8 02/25/2020 1112   HGB 12.4 02/07/2017 1109   HCT 39.3 02/25/2020 1112   HCT 38.2 02/07/2017 1109   PLT 266 02/25/2020 1112   PLT 335 02/07/2017 1109   MCV 89.5 02/25/2020 1112   MCV 86 02/07/2017 1109   MCH 29.2 02/25/2020 1112   MCHC 32.6 02/25/2020 1112   RDW 14.6 02/25/2020 1112   RDW 15.1 02/07/2017 1109   LYMPHSABS 1.5 02/25/2020 1112   LYMPHSABS 3.0 02/07/2017 1109   MONOABS 1.0 02/25/2020 1112   EOSABS 1.5 (H) 02/25/2020 1112   EOSABS 1.1 (H) 02/07/2017 1109   BASOSABS 0.1 02/25/2020 1112   BASOSABS 0.0 02/07/2017 1109   CMP Latest Ref Rng & Units 02/25/2020 12/03/2019 09/03/2019  Glucose 70 - 99 mg/dL 96 80 95  BUN 8 - 23 mg/dL '16 15 23  ' Creatinine 0.44 - 1.00 mg/dL 1.14(H) 1.01(H) 0.97  Sodium 135 - 145 mmol/L 136 135 137  Potassium 3.5 - 5.1 mmol/L 4.0 4.2 4.3  Chloride 98 - 111 mmol/L 103 100 103  CO2 22 - 32 mmol/L '23 25 25  ' Calcium 8.9 - 10.3 mg/dL 10.6(H) 9.7 9.6  Total Protein 6.5 - 8.1 g/dL 7.7 7.7 6.8  Total Bilirubin 0.3 - 1.2 mg/dL 0.5 0.5 0.2(L)  Alkaline Phos 38 - 126 U/L 67 70 87  AST 15 - 41 U/L '21 17 20  ' ALT 0 - 44 U/L '13 11 13     ' I personally performed a face-to-face visit.  All questions were answered to patient's stated satisfaction. Encouraged patient to call  with any new concerns or questions before his next visit to  the cancer center and we can certain see him sooner, if needed.     ASSESSMENT & PLAN:   Breast cancer, right ([place]) 1.  Locally advanced right breast cancer, stage IIIa: - She had a mammogram on 03/28/2018's that was B RADS category 0. - She had a diagnostic mammogram and ultrasound on 04/22/2018 that showed a 5.9 x 5 x 6 cm mass extending in the upper right quadrant of the right breast and right axillary lymph nodes measuring 2.8 x 1.6 x 1.7 cm.  Left breast has retroareolar mass. - Patient did not have mammogram in the last 20 years. - Biopsy on 04/29/2018 of the right breast mass was consistent with IDC, grade 2-3, lymph node biopsy positive for metastatic disease, ER/PR positive, HER-2 negative, Ki-67 of 5%. - She was seen by Dr. Arnoldo Morale who recommended right modified radical mastectomy and left simple mastectomy. - Bone scan showed increased uptake in the right greater trochanter and right sacroiliac joint. - CT scan of the chest abdomen pelvis did not show any evidence of metastatic disease.  The uptake in the right greater trochanter was thought to be due to osteoporotic changes.  Uptake in the right sacroiliac joint was likely due from degenerative changes at the right L5-S1 joint. - Neoadjuvant chemotherapy with dose dense AC followed by weekly paclitaxel for 12 weeks. - 4 cycles of dose dense AC from 06/05/2018 through 07/21/2018. -12 cycles of weekly paclitaxel from 08/07/2018 through 10/23/2018. -Anastrozole was started on 11/07/2018 -Right modified radical mastectomy on 12/01/2018 with left simple mastectomy. 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 - Receptor markers were ordered and ER was positive, PR was negative, and HER-2 was negative.  Ki 67 was less than 1%. - She is  tolerating anastrozole very well.  Her lower extremity strength has improved after physical therapy and she is able to walk without a walker now. -She had a PET CT scan on 01/05/2019 which did not show any recurrent or metastatic disease. -Labs done on 02/25/2020 showed WBC 9.2, hemoglobin 12.8, platelets 266, LDH 161-, potassium 4.9, creatinine 1.14. -She will follow-up in 6 months with repeat labs.  2.  Left breast DCIS: -Biopsy on 04/29/2018 of the left breast showed DCIS, ER/PR positive.  Left axillary lymph node biopsy was negative for malignancy. -Left simple mastectomy on 12/01/2018 showed 7 mm DCIS, intermediate grade, with cribriform, papillary and cystic morphology.  No evidence of invasion. - Bilateral mastectomy sites were well-healed no adenopathy or masses present.  3.  Hypokalemia: - Labs on 02/25/2020 showed potassium 4.0 -She will continue taking her potassium supplements at this time.  4.  Osteopenia: - Last DEXA scan on 11/13/2018 showed T score of -0.8. -She is taking calcium and vitamin D twice daily. -I have recommended Prolia every 6 months. -She has recently been cleared by the dentist and had teeth pulled. - We will give her her first Prolia injection was on 09/10/2019 -She will get her second Prolia injection today on 03/03/2020.       Orders placed this encounter:  Orders Placed This Encounter  Procedures   Lactate dehydrogenase   Vitamin B12   VITAMIN D 25 Hydroxy (Vit-D Deficiency, Fractures)   Folate   Magnesium   CBC  with Differential/Platelet   Comprehensive metabolic panel      Francene Finders, FNP-C [place] [phone]

## 2020-03-03 NOTE — Patient Instructions (Signed)
[facility] at [facility] Discharge Instructions  Follow up in 6 months with labs    Thank you for choosing [facility] at [facility] to provide your oncology and hematology care.  To afford each patient quality time with our provider, please arrive at least 15 minutes before your scheduled appointment time.   If you have a lab appointment with the Cancer Center please come in thru the Main Entrance and check in at the main information desk.  You need to re-schedule your appointment should you arrive 10 or more minutes late.  We strive to give you quality time with our providers, and arriving late affects you and other patients whose appointments are after yours.  Also, if you no show three or more times for appointments you may be dismissed from the clinic at the providers discretion.     Again, thank you for choosing [facility].  Our hope is that these requests will decrease the amount of time that you wait before being seen by our physicians.       _____________________________________________________________  Should you have questions after your visit to [facility], please contact our office at [phone] between the hours of 8:00 a.m. and 4:30 p.m.  Voicemails left after 4:00 p.m. will not be returned until the following business day.  For prescription refill requests, have your pharmacy contact our office and allow 72 hours.    Due to Covid, you will need to wear a mask upon entering the hospital. If you do not have a mask, a mask will be given to you at the Main Entrance upon arrival. For doctor visits, patients may have 1 support person with them. For treatment visits, patients can not have anyone with them due to social distancing guidelines and our immunocompromised population.      

## 2020-03-21 ENCOUNTER — Other Ambulatory Visit (HOSPITAL_COMMUNITY): Payer: Self-pay | Admitting: Nurse Practitioner

## 2020-03-21 DIAGNOSIS — E876 Hypokalemia: Secondary | ICD-10-CM

## 2020-03-24 ENCOUNTER — Ambulatory Visit (INDEPENDENT_AMBULATORY_CARE_PROVIDER_SITE_OTHER): Payer: Medicare Other | Admitting: Family Medicine

## 2020-03-24 ENCOUNTER — Other Ambulatory Visit: Payer: Self-pay

## 2020-03-24 ENCOUNTER — Encounter: Payer: Self-pay | Admitting: Family Medicine

## 2020-03-24 VITALS — BP 136/82 | Temp 97.3°F | Wt 171.4 lb

## 2020-03-24 DIAGNOSIS — I10 Essential (primary) hypertension: Secondary | ICD-10-CM | POA: Diagnosis not present

## 2020-03-24 DIAGNOSIS — Z13220 Encounter for screening for lipoid disorders: Secondary | ICD-10-CM

## 2020-03-24 DIAGNOSIS — M25511 Pain in right shoulder: Secondary | ICD-10-CM

## 2020-03-24 DIAGNOSIS — M79641 Pain in right hand: Secondary | ICD-10-CM | POA: Diagnosis not present

## 2020-03-24 DIAGNOSIS — E119 Type 2 diabetes mellitus without complications: Secondary | ICD-10-CM | POA: Diagnosis not present

## 2020-03-24 MED ORDER — DICLOFENAC SODIUM 50 MG PO TBEC: DELAYED_RELEASE_TABLET | ORAL | 2 refills | Status: DC

## 2020-03-24 NOTE — Progress Notes (Signed)
   Subjective:    Patient ID: Whitney Velez, female    DOB: 1950-06-03, 70 y.o.   MRN: [id number]  Shoulder Pain  The pain is present in the right arm and right hand. This is a new problem. Episode onset: 3 weeks. Episode frequency: 3-4 times a day; most time late at night. The problem has been gradually worsening. The quality of the pain is described as sharp. Associated symptoms comments: Pt states that arm will hurts so bad that she will cry and at times she will not be able to fully close right hand . She has tried nothing for the symptoms.   Pt had mastectomy in December 2020 and is not sure if that has anything to do with pain  Worse at night  Blood pressure medicine and blood pressure levels reviewed today with patient. Compliant with blood pressure medicine. States does not miss a dose. No obvious side effects. Blood pressure generally good when checked elsewhere. Watching salt intake.   Patient claims compliance with diabetes medication. No obvious side effects. Reports no substantial low sugar spells. Most numbers are generally in good range when checked fasting. Generally does not miss a dose of medication. Watching diabetic diet closely  Patient has symptoms in the right arm and shoulder and hand.  Somewhat atypical in any specific diagnosis.  Some aching shoulder discomfort with certain movements of the shoulder.  Also notes hand discomfort bilateral right greater than right.  And forearm discomfort.  Trace edema at times.  Status postmastectomy on the right.  Review of Systems No headache no chest pain no shortness of breath    Objective:   Physical Exam  Alert active good hydration.  Somewhat obese.  HEENT normal.  Right shoulder mild impingement sign.  Both hands reveals some mild swelling   Difficult to say whether this is mild edema or baseline obesity.  Some tenderness in the forearm to palpation      Assessment & Plan:  Impression 1 nonspecific arm pain, both hand  and shoulder.  We will do x-rays of these 2 areas.  Will give a trial of an anti-inflammatory medicine  2.  Hypertension decent control discussed to maintain same meds  3.  Type 2 diabetes.  Exact control uncertain.  Await blood work  4.  Asthma.  Clinically stable compliant with meds  5.  Status post breast cancer surgery  Trial Voltaren symptom care discussed warning signs discussed.  Shoulder exercises encouraged.  Follow-up in 6 months

## 2020-03-25 ENCOUNTER — Telehealth: Payer: Self-pay | Admitting: Family Medicine

## 2020-03-25 MED ORDER — DICLOFENAC SODIUM 50 MG PO TBEC: DELAYED_RELEASE_TABLET | ORAL | 2 refills | Status: DC

## 2020-03-25 NOTE — Telephone Encounter (Signed)
Prescription sent electronically to pharmacy as requested.

## 2020-03-25 NOTE — Telephone Encounter (Signed)
Meds from yesterday, Voltaren, sent to [facility] and needs to go to [facility].

## 2020-03-28 DIAGNOSIS — E119 Type 2 diabetes mellitus without complications: Secondary | ICD-10-CM | POA: Diagnosis not present

## 2020-03-28 DIAGNOSIS — I10 Essential (primary) hypertension: Secondary | ICD-10-CM | POA: Diagnosis not present

## 2020-03-28 DIAGNOSIS — Z13220 Encounter for screening for lipoid disorders: Secondary | ICD-10-CM | POA: Diagnosis not present

## 2020-03-29 LAB — HEMOGLOBIN A1C
Est. average glucose Bld gHb Est-mCnc: 111 mg/dL
Hgb A1c MFr Bld: 5.5 % (ref 4.8–5.6)

## 2020-03-29 LAB — LIPID PANEL
Chol/HDL Ratio: 3.2 ratio (ref 0.0–4.4)
Cholesterol, Total: 211 mg/dL — ABNORMAL HIGH (ref 100–199)
HDL: 65 mg/dL (ref >39)
LDL Chol Calc (NIH): 132 mg/dL — ABNORMAL HIGH (ref 0–99)
Triglycerides: 81 mg/dL (ref 0–149)
VLDL Cholesterol Cal: 14 mg/dL (ref 5–40)

## 2020-03-29 LAB — SEDIMENTATION RATE: Sed Rate: 22 mm/hr (ref 0–40)

## 2020-04-03 ENCOUNTER — Encounter: Payer: Self-pay | Admitting: Family Medicine

## 2020-04-08 ENCOUNTER — Other Ambulatory Visit: Payer: Self-pay | Admitting: Family Medicine

## 2020-04-08 ENCOUNTER — Telehealth: Payer: Self-pay | Admitting: Family Medicine

## 2020-04-08 NOTE — Telephone Encounter (Signed)
[facility] calling back about the Hydrochlorothiazide rx that we refused.  Said patient requested it and we have filled it before.  Nurse unavailable at time so I told them someone would call them back.

## 2020-04-11 NOTE — Telephone Encounter (Signed)
Call pt, this was stopped in fall of 2019 per our records (but not clear why) ask her why we stopped, ask her why she want to resume

## 2020-04-11 NOTE — Telephone Encounter (Signed)
Left message to return call 

## 2020-04-13 ENCOUNTER — Other Ambulatory Visit: Payer: Self-pay | Admitting: *Deleted

## 2020-04-13 NOTE — Telephone Encounter (Signed)
Ok resumme hctz at old dose for one year

## 2020-04-13 NOTE — Telephone Encounter (Signed)
Phoned in [facility] pharmacy to approve refill and called to let pt know.

## 2020-06-27 ENCOUNTER — Telehealth: Payer: Self-pay | Admitting: *Deleted

## 2020-06-27 ENCOUNTER — Telehealth: Payer: Self-pay | Admitting: Family Medicine

## 2020-06-27 NOTE — Telephone Encounter (Signed)
Patient requesting prescription for a nebulizer. Patient has been using one she got from a family member and it is no longer working. Please advise.

## 2020-06-28 ENCOUNTER — Other Ambulatory Visit: Payer: Self-pay | Admitting: *Deleted

## 2020-06-28 MED ORDER — ALBUTEROL SULFATE HFA 108 (90 BASE) MCG/ACT IN AERS: INHALATION_SPRAY | RESPIRATORY_TRACT | 1 refills | Status: DC

## 2020-06-28 NOTE — Telephone Encounter (Signed)
Lm to find out what pharmacy this needs to be sent to.

## 2020-06-28 NOTE — Telephone Encounter (Signed)
Error

## 2020-06-28 NOTE — Telephone Encounter (Signed)
Await md sign

## 2020-06-28 NOTE — Telephone Encounter (Signed)
[facility] 

## 2020-06-28 NOTE — Telephone Encounter (Signed)
Please write out the script for this.  Thx. Dr. Lovena Le

## 2020-06-29 NOTE — Telephone Encounter (Signed)
[facility] called stating diagnosis of Reactive airways dysfunction syndrome will not cover nebulizer (this is the only thing I found in the chart) and they also needed dosage and medication info for nebulizer.

## 2020-06-29 NOTE — Telephone Encounter (Signed)
Pls call pt and let her know it's not covered.  Does she see a pulmonologist?  If so she can get with them about it.   Otherwise she will need referral to pulm for PFTs to have diagnosis of asthma.   Thx.   Dr. Lovena Le

## 2020-06-30 NOTE — Telephone Encounter (Signed)
Patients daughter notified of Dr. Tanna Furry recommendation. Daughter states pt has had asthma for a long time and was seeing Dr. Richardson Landry about that which is why she has the inhalers but if she needs to go to pulmonology then they were ok with getting a referral.

## 2020-06-30 NOTE — Telephone Encounter (Signed)
Ok pls submit referral to pulmonary.  Regular inahler we can give her, but if needing a new machine, then pt needs to have pfts and documented asthma dx.   All in the chart at this time is "reactive airway disease."  Thx.   Dr. Lovena Le

## 2020-07-01 ENCOUNTER — Other Ambulatory Visit: Payer: Self-pay | Admitting: *Deleted

## 2020-07-01 DIAGNOSIS — J683 Other acute and subacute respiratory conditions due to chemicals, gases, fumes and vapors: Secondary | ICD-10-CM

## 2020-07-08 ENCOUNTER — Encounter: Payer: Self-pay | Admitting: Family Medicine

## 2020-07-14 ENCOUNTER — Other Ambulatory Visit: Payer: Self-pay

## 2020-07-14 ENCOUNTER — Ambulatory Visit (INDEPENDENT_AMBULATORY_CARE_PROVIDER_SITE_OTHER): Payer: Medicare Other | Admitting: Nurse Practitioner

## 2020-07-14 ENCOUNTER — Encounter: Payer: Self-pay | Admitting: Nurse Practitioner

## 2020-07-14 ENCOUNTER — Ambulatory Visit (HOSPITAL_COMMUNITY)
Admission: RE | Admit: 2020-07-14 | Discharge: 2020-07-14 | Disposition: A | Discharge status: Home / Self Care | Payer: Medicare Other | Source: Ambulatory Visit | Attending: Nurse Practitioner | Admitting: Nurse Practitioner

## 2020-07-14 VITALS — BP 130/74 | HR 88 | Temp 97.1°F | Ht 60.0 in | Wt 171.0 lb

## 2020-07-14 DIAGNOSIS — J683 Other acute and subacute respiratory conditions due to chemicals, gases, fumes and vapors: Secondary | ICD-10-CM

## 2020-07-14 DIAGNOSIS — F410 Panic disorder [episodic paroxysmal anxiety] without agoraphobia: Secondary | ICD-10-CM | POA: Diagnosis not present

## 2020-07-14 DIAGNOSIS — R0602 Shortness of breath: Secondary | ICD-10-CM | POA: Diagnosis present

## 2020-07-14 MED ORDER — CLONAZEPAM 0.5 MG PO TABS: ORAL_TABLET | ORAL | 0 refills | Status: DC

## 2020-07-14 MED ORDER — FLUTICASONE PROPIONATE HFA 44 MCG/ACT IN AERO: 2.0000 | INHALATION_SPRAY | Freq: Two times a day (BID) | RESPIRATORY_TRACT | 2 refills | Status: DC

## 2020-07-14 MED ORDER — ALBUTEROL SULFATE HFA 108 (90 BASE) MCG/ACT IN AERS: INHALATION_SPRAY | RESPIRATORY_TRACT | 2 refills | Status: DC

## 2020-07-14 NOTE — Progress Notes (Signed)
° °  Subjective:    Patient ID: Whitney Velez, female    DOB: 04/13/1950, 70 y.o.   MRN: [id number]  HPIshortness of breath for about 2 months. Worse on hot days. Using albuterol inhaler 3 -4 times a day.     Review of Systems     Objective:   Physical Exam        Assessment & Plan:

## 2020-07-14 NOTE — Progress Notes (Signed)
Subjective:    Patient ID: Whitney Velez, female    DOB: Nov 14, 1950, 70 y.o.   MRN: [id number]  HPI shortness of breath   Onset 3 months ago: shortness of breath and wheezing have worsened.  Is having to use inhaler more frequently about 3-4 times a day.  After using inhaler several times it takes about an hour for pt to recover.  Shortness of breath wakes pt during sleep, worsens when outside in sun, and doing normal activities around the house. States she gets anxious during these episodes which makes the it worse and thinks this is what makes them last so long. Has been referred to pulmonology clinic; still waiting on appointment.   Review of Systems  Constitutional: Positive for activity change. Negative for fatigue and fever.  Respiratory: Positive for shortness of breath. Negative for cough and chest tightness.   Cardiovascular: Negative for chest pain, palpitations and leg swelling.  Gastrointestinal: Negative for abdominal pain.       Denies reflux symptoms.   Psychiatric/Behavioral: Positive for sleep disturbance.       Objective:   Physical Exam Vitals reviewed.  Constitutional:      General: She is not in acute distress.    Appearance: She is well-developed.  Cardiovascular:     Rate and Rhythm: Normal rate and regular rhythm.     Heart sounds: No murmur heard.  No friction rub. No gallop.   Pulmonary:     Effort: Pulmonary effort is normal. No respiratory distress.     Breath sounds: Decreased breath sounds present. No wheezing.     Comments: Able to talk in complete sentences. Normal color. Mildly diminished breath sounds in general.  Musculoskeletal:     Cervical back: Normal range of motion.  Skin:    General: Skin is warm.  Neurological:     Mental Status: She is alert and oriented to person, place, and time.  Psychiatric:        Mood and Affect: Mood normal.        Behavior: Behavior normal.        Thought Content: Thought content normal.         Judgment: Judgment normal.   Lower extremities: no edema Vitals:   07/14/20 1318  BP: 130/74  Pulse: 88  Temp: (!) 97.1 F (36.2 C)  SpO2: 98%   EKG: low voltage but NSR       Assessment & Plan:   Problem List Items Addressed This Visit      Respiratory   Reactive airways dysfunction syndrome ([place])   Relevant Orders   DG Chest 2 View (Completed)    Other Visit Diagnoses    Shortness of breath    -  Primary   Relevant Orders   EKG 12-Lead   DG Chest 2 View (Completed)   Anxiety attack           Meds ordered this encounter  Medications  . albuterol (PROAIR HFA) 108 (90 Base) MCG/ACT inhaler    Sig: INHALE TWO PUFFS INTO THE LUNGS EVERY FOUR HOURS AS NEEDED FOR WHEEZING    Dispense:  18 g    Refill:  2    Order Specific Question:   Supervising Provider    Answer:   Sallee Lange A [[id number]]  . fluticasone (FLOVENT HFA) 44 MCG/ACT inhaler    Sig: Inhale 2 puffs into the lungs in the morning and at bedtime. TO PREVENT WHEEZING    Dispense:  1 Inhaler  Refill:  2    Order Specific Question:   Supervising Provider    Answer:   Sallee Lange A [id number]  . clonazePAM (KLONOPIN) 0.5 MG tablet    Sig: 1/2 TAB PO QID PRN AS NEEDED FOR ANXIETY (MAX 2 TABS PER 24 HOURS)    Dispense:  30 tablet    Refill:  0    Order Specific Question:   Supervising Provider    Answer:   Sallee Lange A [[id number]]   Add Flovent to regimen. Pulmonary consult pending. Xray pending.  Use low dose Klonopin during episodes to help with anxiety. Warning signs reviewed. Contact office or go to ED if symptoms worsen. Call back in 10-14 days if no improvement with Flovent.

## 2020-07-16 ENCOUNTER — Encounter: Payer: Self-pay | Admitting: Nurse Practitioner

## 2020-07-20 ENCOUNTER — Encounter: Payer: Self-pay | Admitting: Family Medicine

## 2020-08-08 ENCOUNTER — Other Ambulatory Visit (HOSPITAL_COMMUNITY): Payer: Self-pay | Admitting: Nurse Practitioner

## 2020-08-08 DIAGNOSIS — Z170 Estrogen receptor positive status [ER+]: Secondary | ICD-10-CM

## 2020-08-15 ENCOUNTER — Other Ambulatory Visit (HOSPITAL_COMMUNITY): Payer: Self-pay | Admitting: *Deleted

## 2020-08-15 DIAGNOSIS — C50811 Malignant neoplasm of overlapping sites of right female breast: Secondary | ICD-10-CM

## 2020-08-15 DIAGNOSIS — E876 Hypokalemia: Secondary | ICD-10-CM

## 2020-08-15 DIAGNOSIS — Z170 Estrogen receptor positive status [ER+]: Secondary | ICD-10-CM

## 2020-08-15 MED ORDER — ANASTROZOLE 1 MG PO TABS: ORAL_TABLET | ORAL | 3 refills | Status: DC

## 2020-08-25 ENCOUNTER — Other Ambulatory Visit: Payer: Self-pay | Admitting: Nurse Practitioner

## 2020-09-06 ENCOUNTER — Encounter (HOSPITAL_COMMUNITY): Payer: Self-pay | Admitting: Nurse Practitioner

## 2020-09-06 ENCOUNTER — Inpatient Hospital Stay (HOSPITAL_COMMUNITY): Payer: Medicare Other | Attending: Hematology

## 2020-09-06 ENCOUNTER — Inpatient Hospital Stay (HOSPITAL_BASED_OUTPATIENT_CLINIC_OR_DEPARTMENT_OTHER): Payer: Medicare Other | Admitting: Nurse Practitioner

## 2020-09-06 ENCOUNTER — Other Ambulatory Visit: Payer: Self-pay

## 2020-09-06 ENCOUNTER — Inpatient Hospital Stay (HOSPITAL_COMMUNITY): Payer: Medicare Other

## 2020-09-06 DIAGNOSIS — E876 Hypokalemia: Secondary | ICD-10-CM | POA: Diagnosis not present

## 2020-09-06 DIAGNOSIS — I10 Essential (primary) hypertension: Secondary | ICD-10-CM | POA: Diagnosis not present

## 2020-09-06 DIAGNOSIS — Z7982 Long term (current) use of aspirin: Secondary | ICD-10-CM | POA: Diagnosis not present

## 2020-09-06 DIAGNOSIS — Z791 Long term (current) use of non-steroidal anti-inflammatories (NSAID): Secondary | ICD-10-CM | POA: Diagnosis not present

## 2020-09-06 DIAGNOSIS — Z79811 Long term (current) use of aromatase inhibitors: Secondary | ICD-10-CM | POA: Insufficient documentation

## 2020-09-06 DIAGNOSIS — Z7951 Long term (current) use of inhaled steroids: Secondary | ICD-10-CM | POA: Diagnosis not present

## 2020-09-06 DIAGNOSIS — C50911 Malignant neoplasm of unspecified site of right female breast: Secondary | ICD-10-CM | POA: Diagnosis not present

## 2020-09-06 DIAGNOSIS — M8580 Other specified disorders of bone density and structure, unspecified site: Secondary | ICD-10-CM | POA: Diagnosis not present

## 2020-09-06 DIAGNOSIS — Z79899 Other long term (current) drug therapy: Secondary | ICD-10-CM | POA: Insufficient documentation

## 2020-09-06 DIAGNOSIS — C50811 Malignant neoplasm of overlapping sites of right female breast: Secondary | ICD-10-CM

## 2020-09-06 DIAGNOSIS — Z170 Estrogen receptor positive status [ER+]: Secondary | ICD-10-CM | POA: Diagnosis not present

## 2020-09-06 DIAGNOSIS — C50912 Malignant neoplasm of unspecified site of left female breast: Secondary | ICD-10-CM | POA: Insufficient documentation

## 2020-09-06 DIAGNOSIS — J45909 Unspecified asthma, uncomplicated: Secondary | ICD-10-CM | POA: Diagnosis not present

## 2020-09-06 LAB — COMPREHENSIVE METABOLIC PANEL
ALT: 15 U/L (ref 0–44)
AST: 19 U/L (ref 15–41)
Albumin: 3.8 g/dL (ref 3.5–5.0)
Alkaline Phosphatase: 69 U/L (ref 38–126)
Anion gap: 11 (ref 5–15)
BUN: 17 mg/dL (ref 8–23)
CO2: 27 mmol/L (ref 22–32)
Calcium: 9.6 mg/dL (ref 8.9–10.3)
Chloride: 98 mmol/L (ref 98–111)
Creatinine, Ser: 1.08 mg/dL — ABNORMAL HIGH (ref 0.44–1.00)
GFR calc Af Amer: 59 mL/min — ABNORMAL LOW (ref >60)
GFR calc non Af Amer: 51 mL/min — ABNORMAL LOW (ref >60)
Glucose, Bld: 114 mg/dL — ABNORMAL HIGH (ref 70–99)
Potassium: 3.8 mmol/L (ref 3.5–5.1)
Sodium: 136 mmol/L (ref 135–145)
Total Bilirubin: 0.8 mg/dL (ref 0.3–1.2)
Total Protein: 7.5 g/dL (ref 6.5–8.1)

## 2020-09-06 LAB — CBC WITH DIFFERENTIAL/PLATELET
Abs Immature Granulocytes: 0.05 10*3/uL (ref 0.00–0.07)
Basophils Absolute: 0.1 10*3/uL (ref 0.0–0.1)
Basophils Relative: 1 %
Eosinophils Absolute: 1.1 10*3/uL — ABNORMAL HIGH (ref 0.0–0.5)
Eosinophils Relative: 14 %
HCT: 38 % (ref 36.0–46.0)
Hemoglobin: 12.2 g/dL (ref 12.0–15.0)
Immature Granulocytes: 1 %
Lymphocytes Relative: 17 %
Lymphs Abs: 1.4 10*3/uL (ref 0.7–4.0)
MCH: 28.6 pg (ref 26.0–34.0)
MCHC: 32.1 g/dL (ref 30.0–36.0)
MCV: 89 fL (ref 80.0–100.0)
Monocytes Absolute: 0.8 10*3/uL (ref 0.1–1.0)
Monocytes Relative: 10 %
Neutro Abs: 4.9 10*3/uL (ref 1.7–7.7)
Neutrophils Relative %: 57 %
Platelets: 274 10*3/uL (ref 150–400)
RBC: 4.27 MIL/uL (ref 3.87–5.11)
RDW: 14.5 % (ref 11.5–15.5)
WBC: 8.3 10*3/uL (ref 4.0–10.5)
nRBC: 0 % (ref 0.0–0.2)

## 2020-09-06 LAB — VITAMIN D 25 HYDROXY (VIT D DEFICIENCY, FRACTURES): Vit D, 25-Hydroxy: 29.99 ng/mL — ABNORMAL LOW (ref 30–100)

## 2020-09-06 LAB — LACTATE DEHYDROGENASE: LDH: 163 U/L (ref 98–192)

## 2020-09-06 LAB — VITAMIN B12: Vitamin B-12: 339 pg/mL (ref 180–914)

## 2020-09-06 LAB — MAGNESIUM: Magnesium: 1.9 mg/dL (ref 1.7–2.4)

## 2020-09-06 LAB — FOLATE: Folate: 11.2 ng/mL (ref >5.9)

## 2020-09-06 MED ORDER — DENOSUMAB 60 MG/ML ~~LOC~~ SOSY
60.0000 mg | PREFILLED_SYRINGE | Freq: Once | SUBCUTANEOUS | Status: AC
  Administered 2020-09-06: 60 mg via SUBCUTANEOUS
  Filled 2020-09-06: qty 1

## 2020-09-06 NOTE — Progress Notes (Signed)
[place], [place] [address]   CLINIC:  Medical Oncology/Hematology  PCP:  Erven Colla, DO [place] [address] [phone]   REASON FOR VISIT: Follow-up for breast cancer   CURRENT THERAPY: Anastrozole  BRIEF ONCOLOGIC HISTORY:  Oncology History  Breast cancer, right ([place])  05/12/2018 Initial Diagnosis   Breast cancer, right ([place])   06/05/2018 - 10/30/2018 Chemotherapy   The patient had DOXOrubicin (ADRIAMYCIN) chemo injection 110 mg, 60 mg/m2 = 110 mg, Intravenous,  Once, 4 of 4 cycles Administration: 110 mg (06/05/2018), 110 mg (06/19/2018), 110 mg (07/07/2018), 110 mg (07/21/2018) palonosetron (ALOXI) injection 0.25 mg, 0.25 mg, Intravenous,  Once, 4 of 4 cycles Administration: 0.25 mg (06/05/2018), 0.25 mg (06/19/2018), 0.25 mg (07/07/2018), 0.25 mg (07/21/2018) pegfilgrastim-cbqv (UDENYCA) injection 6 mg, 6 mg, Subcutaneous, Once, 4 of 4 cycles Administration: 6 mg (06/06/2018), 6 mg (06/20/2018), 6 mg (07/08/2018), 6 mg (07/22/2018) cyclophosphamide (CYTOXAN) 1,100 mg in sodium chloride 0.9 % 250 mL chemo infusion, 600 mg/m2 = 1,100 mg, Intravenous,  Once, 4 of 4 cycles Administration: 1,100 mg (06/05/2018), 1,100 mg (06/19/2018), 1,100 mg (07/07/2018), 1,100 mg (07/21/2018) PACLitaxel (TAXOL) 144 mg in sodium chloride 0.9 % 250 mL chemo infusion (</= 62m/m2), 80 mg/m2 = 144 mg, Intravenous,  Once, 12 of 12 cycles Administration: 144 mg (08/07/2018), 144 mg (08/14/2018), 144 mg (08/21/2018), 144 mg (08/28/2018), 144 mg (09/04/2018), 144 mg (09/11/2018), 144 mg (09/18/2018), 144 mg (09/25/2018), 144 mg (10/02/2018), 144 mg (10/09/2018), 144 mg (10/17/2018), 144 mg (10/24/2018) fosaprepitant (EMEND) 150 mg, dexamethasone (DECADRON) 12 mg in sodium chloride 0.9 % 145 mL IVPB, , Intravenous,  Once, 4 of 4 cycles Administration:  (06/05/2018),  (06/19/2018),  (07/07/2018),  (07/21/2018)  for chemotherapy treatment.       INTERVAL HISTORY:  Ms. [id number] y.o. female returns for routine follow-up for breast cancer.  Patient reports she is doing well since her last visit.  She denies any lumps bumps present.  She denies any new bone pain. Denies any nausea, vomiting, or diarrhea. Denies any new pains. Had not noticed any recent bleeding such as epistaxis, hematuria or hematochezia. Denies recent chest pain on exertion, shortness of breath on minimal exertion, pre-syncopal episodes, or palpitations. Denies any numbness or tingling in hands or feet. Denies any recent fevers, infections, or recent hospitalizations. Patient reports appetite at 100% and energy level at 75%.     REVIEW OF SYSTEMS:  Review of Systems  Respiratory: Positive for shortness of breath (With activity).   Gastrointestinal: Positive for constipation.  Neurological: Positive for numbness.  All other systems reviewed and are negative.    PAST MEDICAL/SURGICAL HISTORY:  Past Medical History:  Diagnosis Date   Asthma    Hypertension    Past Surgical History:  Procedure Laterality Date   ABDOMINAL HYSTERECTOMY     MASTECTOMY MODIFIED RADICAL Right 12/01/2018   Procedure: RIGHT MODIFIED RADICAL MASTECTOMY (Procedure #1);  Surgeon: JAviva Signs MD;  Location: [facility];  Service: General;  Laterality: Right;   PORTACATH PLACEMENT Left 06/04/2018   Procedure: INSERTION PORT-A-CATH;  Surgeon: JAviva Signs MD;  Location: [facility];  Service: General;  Laterality: Left;   SIMPLE MASTECTOMY WITH AXILLARY SENTINEL NODE BIOPSY Left 12/01/2018   Procedure: LEFT SIMPLE MASTECTOMY (Procedure #2);  Surgeon: JAviva Signs MD;  Location: [facility];  Service: General;  Laterality: Left;     SOCIAL HISTORY:  Social History   Socioeconomic History   Marital status: Widowed  Spouse name: Not on file   Number of children: Not on file   Years of education: Not on file   Highest education level: Not on file  Occupational History   Not on file  Tobacco Use   Smoking status:  Never Smoker   Smokeless tobacco: Never Used  Vaping Use   Vaping Use: Never used  Substance and Sexual Activity   Alcohol use: Never   Drug use: Never   Sexual activity: Not on file  Other Topics Concern   Not on file  Social History Narrative   Not on file   Social Determinants of Health   Financial Resource Strain:    Difficulty of Paying Living Expenses: Not on file  Food Insecurity:    Worried About [place] in the Last Year: Not on file   Ran Out of Food in the Last Year: Not on file  Transportation Needs:    Lack of Transportation (Medical): Not on file   Lack of Transportation (Non-Medical): Not on file  Physical Activity:    Days of Exercise per Week: Not on file   Minutes of Exercise per Session: Not on file  Stress:    Feeling of Stress : Not on file  Social Connections:    Frequency of Communication with Friends and Family: Not on file   Frequency of Social Gatherings with Friends and Family: Not on file   Attends Religious Services: Not on file   Active Member of Clubs or Organizations: Not on file   Attends Archivist Meetings: Not on file   Marital Status: Not on file  Intimate Partner Violence:    Fear of Current or Ex-Partner: Not on file   Emotionally Abused: Not on file   Physically Abused: Not on file   Sexually Abused: Not on file    FAMILY HISTORY:  History reviewed. No pertinent family history.  CURRENT MEDICATIONS:  Outpatient Encounter Medications as of 09/06/2020  Medication Sig   albuterol (PROAIR HFA) 108 (90 Base) MCG/ACT inhaler INHALE TWO PUFFS INTO THE LUNGS EVERY FOUR HOURS AS NEEDED FOR WHEEZING   amLODipine (NORVASC) 5 MG tablet Take one daily   anastrozole (ARIMIDEX) 1 MG tablet TAKE ONE (1) TABLET BY MOUTH EVERY DAY   anastrozole (ARIMIDEX) 1 MG tablet TAKE ONE (1) TABLET BY MOUTH EVERY DAY   aspirin 81 MG tablet Take 81 mg by mouth daily.   Calcium Carb-Cholecalciferol (CALCIUM  + VITAMIN D3 PO) Take 1 tablet by mouth 2 (two) times daily.   clonazePAM (KLONOPIN) 0.5 MG tablet TAKE ONE-HALF TABLET BY MOUTH BID PRN ANXIETY   denosumab (PROLIA) 60 MG/ML SOSY injection Inject 60 mg into the skin every 6 (six) months.   diclofenac (VOLTAREN) 50 MG EC tablet Take one tablet po BID with food prn pain   Flaxseed, Linseed, (FLAXSEED OIL) 1000 MG CAPS Take 1,000 mg by mouth daily.    fluticasone (FLOVENT HFA) 44 MCG/ACT inhaler Inhale 2 puffs into the lungs in the morning and at bedtime. TO PREVENT WHEEZING   hydrochlorothiazide (HYDRODIURIL) 25 MG tablet Take 25 mg by mouth daily. Verbal refill to [facility] per Dr. Richardson Landry. #90 with 3 rf   ibuprofen (ADVIL) 200 MG tablet Take 200 mg by mouth at bedtime as needed.   montelukast (SINGULAIR) 10 MG tablet TAKE ONE TABLET ($RemoveBef'10MG'AfeoBIslmR$  TOTAL) BY MOUTH ATBEDTIME   OVER THE COUNTER MEDICATION Stool softner once daily   potassium chloride SA (KLOR-CON) 20 MEQ tablet TAKE ONE  TABLET (20MEQ TOTAL) BY MOUTH TWO TIMES DAILY   No facility-administered encounter medications on file as of 09/06/2020.    ALLERGIES:  Allergies  Allergen Reactions   Penicillins Other (See Comments)    Nausea, vomiting, dizziness Has patient had a PCN reaction causing immediate rash, facial/tongue/throat swelling, SOB or lightheadedness with hypotension: no Has patient had a PCN reaction causing severe rash involving mucus membranes or skin necrosis: no Has patient had a PCN reaction that required hospitalization: no Has patient had a PCN reaction occurring within the last 10 years: no If all of the above answers are "NO", then may proceed with Cephalosporin use.    Percocet [Oxycodone-Acetaminophen] Diarrhea and Nausea And Vomiting   Ranitidine Itching     PHYSICAL EXAM:  ECOG Performance status: 1  Vitals:   09/06/20 1125 09/06/20 1138  BP:  (!) 133/55  Pulse: (!) 58   Resp: 18   Temp: (!) 96.9 F (36.1 C)   SpO2: 98%    Filed  Weights   09/06/20 1125  Weight: 174 lb 4.8 oz (79.1 kg)   Physical Exam Constitutional:      Appearance: Normal appearance. She is normal weight.  Cardiovascular:     Rate and Rhythm: Normal rate and regular rhythm.     Heart sounds: Normal heart sounds.  Pulmonary:     Effort: Pulmonary effort is normal.     Breath sounds: Normal breath sounds.  Abdominal:     General: Bowel sounds are normal.     Palpations: Abdomen is soft.  Musculoskeletal:        General: Normal range of motion.  Skin:    General: Skin is warm.  Neurological:     Mental Status: She is alert and oriented to person, place, and time. Mental status is at baseline.  Psychiatric:        Mood and Affect: Mood normal.        Behavior: Behavior normal.        Thought Content: Thought content normal.        Judgment: Judgment normal.      LABORATORY DATA:  I have reviewed the labs as listed.  CBC    Component Value Date/Time   WBC 8.3 09/06/2020 1006   RBC 4.27 09/06/2020 1006   HGB 12.2 09/06/2020 1006   HGB 12.4 02/07/2017 1109   HCT 38.0 09/06/2020 1006   HCT 38.2 02/07/2017 1109   PLT 274 09/06/2020 1006   PLT 335 02/07/2017 1109   MCV 89.0 09/06/2020 1006   MCV 86 02/07/2017 1109   MCH 28.6 09/06/2020 1006   MCHC 32.1 09/06/2020 1006   RDW 14.5 09/06/2020 1006   RDW 15.1 02/07/2017 1109   LYMPHSABS 1.4 09/06/2020 1006   LYMPHSABS 3.0 02/07/2017 1109   MONOABS 0.8 09/06/2020 1006   EOSABS 1.1 (H) 09/06/2020 1006   EOSABS 1.1 (H) 02/07/2017 1109   BASOSABS 0.1 09/06/2020 1006   BASOSABS 0.0 02/07/2017 1109   CMP Latest Ref Rng & Units 09/06/2020 02/25/2020 12/03/2019  Glucose 70 - 99 mg/dL 114(H) 96 80  BUN 8 - 23 mg/dL _0 Creatinine 0.44 - 1.00 mg/dL 1.08(H) 1.14(H) 1.01(H)  Sodium 135 - 145 mmol/L 136 136 135  Potassium 3.5 - 5.1 mmol/L 3.8 4.0 4.2  Chloride 98 - 111 mmol/L 98 103 100  CO2 22 - 32 mmol/L _1 Calcium 8.9 - 10.3 mg/dL 9.6 10.6(H) 9.7  Total Protein 6.5 - 8.1  g/dL 7.5  7.7 7.7  Total Bilirubin 0.3 - 1.2 mg/dL 0.8 0.5 0.5  Alkaline Phos 38 - 126 U/L 69 67 70  AST 15 - 41 U/L _0 ALT 0 - 44 U/L _1 All questions were answered to patient's stated satisfaction. Encouraged patient to call with any new concerns or questions before his next visit to the cancer center and we can certain see him sooner, if needed.     ASSESSMENT & PLAN:  Breast cancer, right ([place]) 1.  Locally advanced right breast cancer, stage IIIa: - She had a mammogram on 03/28/2018's that was B RADS category 0. - She had a diagnostic mammogram and ultrasound on 04/22/2018 that showed a 5.9 x 5 x 6 cm mass extending in the upper right quadrant of the right breast and right axillary lymph nodes measuring 2.8 x 1.6 x 1.7 cm.  Left breast has retroareolar mass. - Patient did not have mammogram in the last 20 years. - Biopsy on 04/29/2018 of the right breast mass was consistent with IDC, grade 2-3, lymph node biopsy positive for metastatic disease, ER/PR positive, HER-2 negative, Ki-67 of 5%. - She was seen by Dr. Arnoldo Morale who recommended right modified radical mastectomy and left simple mastectomy. - Bone scan showed increased uptake in the right greater trochanter and right sacroiliac joint. - CT scan of the chest abdomen pelvis did not show any evidence of metastatic disease.  The uptake in the right greater trochanter was thought to be due to osteoporotic changes.  Uptake in the right sacroiliac joint was likely due from degenerative changes at the right L5-S1 joint. - Neoadjuvant chemotherapy with dose dense AC followed by weekly paclitaxel for 12 weeks. - 4 cycles of dose dense AC from 06/05/2018 through 07/21/2018. -12 cycles of weekly paclitaxel from 08/07/2018 through 10/23/2018. -Anastrozole was started on 11/07/2018 -Right modified radical mastectomy on 12/01/2018 with left simple mastectomy. 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 - Receptor markers were ordered and ER was positive, PR was negative, and HER-2 was negative.  Ki 67 was less than 1%. - She is tolerating anastrozole very well.  Her lower extremity strength has improved after physical therapy and she is able to walk without a walker now. -She had a PET CT scan on 01/05/2019 which did not show any recurrent or metastatic disease. -Labs done on 09/06/2020 were all WNL -She will follow-up in 6 months with repeat labs.  2.  Left breast DCIS: -Biopsy on 04/29/2018 of the left breast showed DCIS, ER/PR positive.  Left axillary lymph node biopsy was negative for malignancy. -Left simple mastectomy on 12/01/2018 showed 7 mm DCIS, intermediate grade, with cribriform, papillary and cystic morphology.  No evidence of invasion. - Bilateral mastectomy sites were well-healed no adenopathy or masses present.  3.  Hypokalemia: - Labs on 09/06/2020 showed potassium 3.8 -She will continue taking her potassium supplements at this time.  4.  Osteopenia: - Last DEXA scan on 11/13/2018 showed T score of -0.8. -She is taking calcium and vitamin D twice daily. -I have recommended Prolia every 6 months. -She has recently been cleared by the dentist and had teeth pulled. -She will receive a Prolia injection today on 09/06/2020      Orders placed this encounter:  Orders Placed This  Encounter  Procedures   Lactate dehydrogenase   Vitamin B12   VITAMIN D 25 Hydroxy (Vit-D Deficiency, Fractures)   Folate   CBC with Differential/Platelet   Comprehensive metabolic panel      Francene Finders, FNP-C [place] [phone]

## 2020-09-06 NOTE — Patient Instructions (Signed)
[facility] at [facility] Discharge Instructions  Received Prolia injection today. Follow-up as scheduled   Thank you for choosing  Cancer Center at [facility] to provide your oncology and hematology care.  To afford each patient quality time with our provider, please arrive at least 15 minutes before your scheduled appointment time.   If you have a lab appointment with the Cancer Center please come in thru the Main Entrance and check in at the main information desk.  You need to re-schedule your appointment should you arrive 10 or more minutes late.  We strive to give you quality time with our providers, and arriving late affects you and other patients whose appointments are after yours.  Also, if you no show three or more times for appointments you may be dismissed from the clinic at the providers discretion.     Again, thank you for choosing [facility].  Our hope is that these requests will decrease the amount of time that you wait before being seen by our physicians.       _____________________________________________________________  Should you have questions after your visit to  Cancer Center, please contact our office at [phone] and follow the prompts.  Our office hours are 8:00 a.m. and 4:30 p.m. Monday - Friday.  Please note that voicemails left after 4:00 p.m. may not be returned until the following business day.  We are closed weekends and major holidays.  You do have access to a nurse 24-7, just call the main number to the clinic [phone] and do not press any options, hold on the line and a nurse will answer the phone.    For prescription refill requests, have your pharmacy contact our office and allow 72 hours.    Due to Covid, you will need to wear a mask upon entering the hospital. If you do not have a mask, a mask will be given to you at the Main Entrance upon arrival. For doctor visits, patients may have  1 support person age 18 or older with them. For treatment visits, patients can not have anyone with them due to social distancing guidelines and our immunocompromised population.     

## 2020-09-06 NOTE — Assessment & Plan Note (Addendum)
1.  Locally advanced right breast cancer, stage IIIa: - She had a mammogram on 03/28/2018's that was B RADS category 0. - She had a diagnostic mammogram and ultrasound on 04/22/2018 that showed a 5.9 x 5 x 6 cm mass extending in the upper right quadrant of the right breast and right axillary lymph nodes measuring 2.8 x 1.6 x 1.7 cm.  Left breast has retroareolar mass. - Patient did not have mammogram in the last 20 years. - Biopsy on 04/29/2018 of the right breast mass was consistent with IDC, grade 2-3, lymph node biopsy positive for metastatic disease, ER/PR positive, HER-2 negative, Ki-67 of 5%. - She was seen by Dr. Jenkins who recommended right modified radical mastectomy and left simple mastectomy. - Bone scan showed increased uptake in the right greater trochanter and right sacroiliac joint. - CT scan of the chest abdomen pelvis did not show any evidence of metastatic disease.  The uptake in the right greater trochanter was thought to be due to osteoporotic changes.  Uptake in the right sacroiliac joint was likely due from degenerative changes at the right L5-S1 joint. - Neoadjuvant chemotherapy with dose dense AC followed by weekly paclitaxel for 12 weeks. - 4 cycles of dose dense AC from 06/05/2018 through 07/21/2018. -12 cycles of weekly paclitaxel from 08/07/2018 through 10/23/2018. -Anastrozole was started on 11/07/2018 -Right modified radical mastectomy on 12/01/2018 with left simple mastectomy. 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 - Receptor markers were ordered and ER was positive, PR was negative, and HER-2 was negative.  Ki 67 was less than 1%. - She is tolerating anastrozole very well.  Her lower extremity strength has improved after physical therapy and she is able to walk  without a walker now. -She had a PET CT scan on 01/05/2019 which did not show any recurrent or metastatic disease. -Labs done on 09/06/2020 were all WNL -She will follow-up in 6 months with repeat labs.  2.  Left breast DCIS: -Biopsy on 04/29/2018 of the left breast showed DCIS, ER/PR positive.  Left axillary lymph node biopsy was negative for malignancy. -Left simple mastectomy on 12/01/2018 showed 7 mm DCIS, intermediate grade, with cribriform, papillary and cystic morphology.  No evidence of invasion. - Bilateral mastectomy sites were well-healed no adenopathy or masses present.  3.  Hypokalemia: - Labs on 09/06/2020 showed potassium 3.8 -She will continue taking her potassium supplements at this time.  4.  Osteopenia: - Last DEXA scan on 11/13/2018 showed T score of -0.8. -She is taking calcium and vitamin D twice daily. -I have recommended Prolia every 6 months. -She has recently been cleared by the dentist and had teeth pulled. -She will receive a Prolia injection today on 09/06/2020  

## 2020-09-06 NOTE — Patient Instructions (Signed)
[facility] at [facility] Discharge Instructions  You saw Randi Lockamy, NP, today.   Thank you for choosing [facility] at [facility] to provide your oncology and hematology care.  To afford each patient quality time with our provider, please arrive at least 15 minutes before your scheduled appointment time.   If you have a lab appointment with the Cancer Center please come in thru the Main Entrance and check in at the main information desk.  You need to re-schedule your appointment should you arrive 10 or more minutes late.  We strive to give you quality time with our providers, and arriving late affects you and other patients whose appointments are after yours.  Also, if you no show three or more times for appointments you may be dismissed from the clinic at the providers discretion.     Again, thank you for choosing [facility].  Our hope is that these requests will decrease the amount of time that you wait before being seen by our physicians.       _____________________________________________________________  Should you have questions after your visit to [facility], please contact our office at [phone] and follow the prompts.  Our office hours are 8:00 a.m. and 4:30 p.m. Monday - Friday.  Please note that voicemails left after 4:00 p.m. may not be returned until the following business day.  We are closed weekends and major holidays.  You do have access to a nurse 24-7, just call the main number to the clinic [phone] and do not press any options, hold on the line and a nurse will answer the phone.    For prescription refill requests, have your pharmacy contact our office and allow 72 hours.    Due to Covid, you will need to wear a mask upon entering the hospital. If you do not have a mask, a mask will be given to you at the Main Entrance upon arrival. For doctor visits, patients may have 1 support person age  18 or older with them. For treatment visits, patients can not have anyone with them due to social distancing guidelines and our immunocompromised population.     

## 2020-09-06 NOTE — Progress Notes (Signed)
[place] reviewed with and pt seen by RLockamy NP and pt approved for Prolia injection today per NP                                      Lorella Nimrod tolerated Prolia injectioin well without complaints or incident. Calcium 9.6 today and pt denied any tooth or jaw pain and no recent or future dental visits prior to administering this medication. Pt discharged self ambulatory in satisfactory condition

## 2020-09-22 ENCOUNTER — Other Ambulatory Visit: Payer: Self-pay | Admitting: Nurse Practitioner

## 2020-10-31 ENCOUNTER — Other Ambulatory Visit: Payer: Self-pay | Admitting: Family Medicine

## 2020-10-31 DIAGNOSIS — E876 Hypokalemia: Secondary | ICD-10-CM

## 2020-11-02 NOTE — Telephone Encounter (Signed)
Sent my chart message to schedule aqppointment

## 2020-11-02 NOTE — Telephone Encounter (Signed)
Needs appt with dr Lovena Le then route back to nurses

## 2020-11-04 ENCOUNTER — Other Ambulatory Visit (HOSPITAL_COMMUNITY): Payer: Self-pay

## 2020-11-04 DIAGNOSIS — E876 Hypokalemia: Secondary | ICD-10-CM

## 2020-11-07 MED ORDER — POTASSIUM CHLORIDE CRYS ER 20 MEQ PO TBCR: EXTENDED_RELEASE_TABLET | ORAL | 3 refills | Status: DC

## 2020-11-08 ENCOUNTER — Other Ambulatory Visit: Payer: Self-pay | Admitting: Nurse Practitioner

## 2020-11-15 ENCOUNTER — Other Ambulatory Visit (HOSPITAL_COMMUNITY): Payer: Self-pay | Admitting: Surgery

## 2020-11-18 ENCOUNTER — Other Ambulatory Visit: Payer: Self-pay

## 2020-11-18 ENCOUNTER — Ambulatory Visit (INDEPENDENT_AMBULATORY_CARE_PROVIDER_SITE_OTHER): Payer: Medicare Other | Admitting: Nurse Practitioner

## 2020-11-18 ENCOUNTER — Encounter: Payer: Self-pay | Admitting: Nurse Practitioner

## 2020-11-18 VITALS — BP 132/82 | HR 85 | Temp 97.9°F | Wt 175.4 lb

## 2020-11-18 DIAGNOSIS — M79604 Pain in right leg: Secondary | ICD-10-CM

## 2020-11-18 DIAGNOSIS — M79671 Pain in right foot: Secondary | ICD-10-CM

## 2020-11-18 DIAGNOSIS — M62838 Other muscle spasm: Secondary | ICD-10-CM

## 2020-11-18 DIAGNOSIS — R413 Other amnesia: Secondary | ICD-10-CM

## 2020-11-18 DIAGNOSIS — M79672 Pain in left foot: Secondary | ICD-10-CM | POA: Diagnosis not present

## 2020-11-18 DIAGNOSIS — R410 Disorientation, unspecified: Secondary | ICD-10-CM

## 2020-11-18 DIAGNOSIS — R300 Dysuria: Secondary | ICD-10-CM

## 2020-11-18 DIAGNOSIS — M79605 Pain in left leg: Secondary | ICD-10-CM

## 2020-11-18 LAB — POCT URINALYSIS DIPSTICK
Spec Grav, UA: 1.02 (ref 1.010–1.025)
pH, UA: 6 (ref 5.0–8.0)

## 2020-11-18 MED ORDER — METHOCARBAMOL 500 MG PO TABS: ORAL_TABLET | ORAL | 0 refills | Status: DC

## 2020-11-18 NOTE — Progress Notes (Signed)
   Subjective:    Patient ID: Whitney Velez, female    DOB: [birth year], 70 y.o.   MRN: [id number]  11/20/20: Telephone call to patient's daughter Sharyn Lull who is the main caregiver for Ms. Goffe.  Has noticed increased problems with memory and confusion, worse over the past 3 to 4 months.  Has gotten to the point where she is confused about days of the week at times.  Had some confusion after completing chemo but this is a long time ago.  Has had some pain, numbness and stiffness in her legs again worse after chemo and has also noticed this with weather change.  Her daughter was not aware of the fall that she had recently.  A family member is usually present when she attends her medical appointments.  Her daughter was told that she was going in for routine care and refills. Her daughter reports that she has had chronic urinary incontinence and wears a diaper.  It is still unclear whether she has any changes in urinary symptoms.  Patient was agitated and upset yesterday, similar to what was seen in the office when she was very emotional.  Her daughter reports no complaints of blurred vision or loss of vision.  She has not noticed any speech difficulties or changes in her facial expression.  She does feel some depression is involved.  Her dog passed away 3 to 4 months ago.  She now lives alone.  She has always been involved in her grandchildren's lives but they are now grown and living on her own. Gets regular follow-up with oncology about every 3 months, last checkup was good according to her daughter. Plan: Recent labs and UA reviewed with her daughter. Her daughter will take her tomorrow to get x-ray of her left foot and lumbar area. Due to the history of cancer and chemotherapy as well as the increase in confusion and memory loss as well as the unusual neuropathic symptoms, will order an MRI of the brain with contrast. Due to possible concerns about a UTI and with tenderness in the left CVA area on  exam Friday, will send in antibiotics. Family to take her to ED or urgent care in the meantime if any worsening of her symptoms. Note that patient is no longer driving, family has taken her keys.  Also advised them that she should be careful ambulating at nighttime with taking the muscle relaxant.  She picked this up yesterday so it is unclear whether it is helping her leg symptoms. Follow-up based on test results. Meds ordered this encounter  Medications  . methocarbamol (ROBAXIN) 500 MG tablet    Sig: Take one tablet at bedtime PRN for muscle spasms    Dispense:  30 tablet    Refill:  0    Order Specific Question:   Supervising Provider    Answer:   Sallee Lange A [[id number]]  . sulfamethoxazole-trimethoprim (BACTRIM DS) 800-160 MG tablet    Sig: Take 1 tablet by mouth 2 (two) times daily.    Dispense:  10 tablet    Refill:  0    Order Specific Question:   Supervising Provider    Answer:   Sallee Lange A [[id number]]      Review of Systems     Objective:   Physical Exam        Assessment & Plan:

## 2020-11-18 NOTE — Patient Instructions (Addendum)
Xray lumbar spine and left foot today Podiatry for nail care when able Labs today after visit: Potassium, magnesium, Ferritin, B12 levels, Sed rate, CBC If trouble holding bladder or bowel occurs seek treatment immediately Urine sample today in office Contact office with worsening sx Muscle Relaxer at night as needed for pain Use caution when ambulating after taking muscle relaxer. Do not drive while taking.

## 2020-11-18 NOTE — Progress Notes (Signed)
Subjective:    Subjective   Patient ID: Whitney Velez, female    DOB: Mar 14, 1950, 70 y.o.   MRN: [id number]  HPI Pt reports worsening bilaterally leg and hip pain 8/10 with decreased ROM and difficulty ambulating which started around 4 months ago. She also reports a left foot nodule presenting around same time period. Some difficulty recalling exact dates for onset. She describes pain as a stiffness and cramping that starts at her hips. She experiences onset of this pain predominantly at night and after sitting for long lengths of time.  She states she experiences bilateral thigh burning as well when rising from toilet seat. She has taken Voltaren for the pain which she reports is mildly effective. She reports a fall last week when attempting to ambulate to the bathroom, but denies any new symptoms related to the fall. Significant PMH includes: history of malignant breast cancer s/p chemotherapy and double mastectomy, asthma, hypertension.  Does also report having some shortness of breath at times with exertion, which she states is not new for her. Denies CP/ edema, fever, chills or sudden loss of bowel or bladder.  Patient is followed by oncology last seen 08/2020. Last PET scan 12/2018.  Describes burning with urination, specifically burning in the vulvar area and inner thighs after urinating and trying to get off of the toilet. No incontinence.    Review of Systems  Constitutional: Negative for chills, fever, malaise/fatigue and weight loss.  Respiratory: Positive for shortness of breath. Negative for wheezing.        With exertion   Cardiovascular: Negative for chest pain, palpitations, orthopnea and leg swelling.  Gastrointestinal: Positive for constipation. Negative for abdominal pain, diarrhea, nausea and vomiting.       Occasional constipation  Genitourinary: Positive for dysuria. Negative for flank pain, frequency, hematuria and urgency.  Musculoskeletal: Positive for falls and  joint pain.       Hip and bilateral leg pain  Neurological: Negative for tingling.  Psychiatric/Behavioral: Negative for depression and suicidal ideas. The patient does not have insomnia.      Objective:   Objective  Physical Exam Constitutional:      Appearance: She is obese. She is not ill-appearing.  Cardiovascular:     Rate and Rhythm: Normal rate and regular rhythm.     Heart sounds: Normal heart sounds. No murmur heard.  No gallop.   Pulmonary:     Effort: Pulmonary effort is normal. No respiratory distress.     Breath sounds: Normal breath sounds. No wheezing or rhonchi.  Abdominal:     General: There is no distension.     Palpations: Abdomen is soft. There is no mass.     Tenderness: There is no abdominal tenderness. There is left CVA tenderness. There is no right CVA tenderness or guarding.  Musculoskeletal:     Lumbar back: Negative right straight leg raise test and negative left straight leg raise test.     Right hip: Tenderness present. Decreased range of motion.     Left hip: Tenderness present. Decreased range of motion.     Right upper leg: No deformity or tenderness.     Left upper leg: No deformity or tenderness.     Right lower leg: No deformity or tenderness. No edema.     Left lower leg: No deformity or tenderness. No edema.     Right ankle: No swelling or deformity. No tenderness.     Left ankle: No swelling or deformity. No tenderness.  Right foot: Normal capillary refill. No swelling or deformity. Normal pulse.     Left foot: Normal capillary refill. Deformity (Nodule mid dorsal aspect) present. No swelling or tenderness. Normal pulse.  Feet:     Right foot:     Toenail Condition: Right toenails are abnormally thick and long.     Left foot:     Toenail Condition: Left toenails are abnormally thick.  Neurological:     Mental Status: She is alert.     Gait: Gait abnormal.  Psychiatric:        Mood and Affect: Affect is tearful.        Behavior:  Behavior is cooperative.        Judgment: Judgment normal.   both hips have limited ROM with internal and external rotation, more on the left.  Gait: slow. Ambulates without assistance.  Balance was slightly off for a few seconds when she stopped walking.  Needed assistance getting on and off the exam table. Diabetic Foot Exam - Simple   Simple Foot Form Visual Inspection See comments: Yes Sensation Testing Intact to touch and monofilament testing bilaterally: Yes Pulse Check Posterior Tibialis and Dorsalis pulse intact bilaterally: Yes Comments Firm nodule to left mid dorsal aspect      Today's Vitals   11/18/20 0959  BP: 132/82  Pulse: 85  Temp: 97.9 F (36.6 C)  SpO2: 97%  Weight: 175 lb 6.4 oz (79.6 kg)   Body mass index is 34.26 kg/m.      Assessment & Plan:   Problem List Items Addressed This Visit      Nervous and Auditory   Confusion     Other   Memory loss    Other Visit Diagnoses    Bilateral leg and foot pain    -  Primary   Relevant Orders   Ferritin (Completed)   B12 (Completed)   Potassium (Completed)   Magnesium (Completed)   Sed Rate (ESR) (Completed)   CBC with Differential (Completed)   DG Foot Complete Left   DG Lumbar Spine Complete   Dysuria       Relevant Orders   POCT Urinalysis Dipstick (Completed)   Left foot pain       Relevant Orders   Ferritin (Completed)   B12 (Completed)   Potassium (Completed)   Magnesium (Completed)   Sed Rate (ESR) (Completed)   CBC with Differential (Completed)   DG Foot Complete Left   DG Lumbar Spine Complete   Muscle spasms of both lower extremities       Relevant Orders   Ferritin (Completed)   B12 (Completed)   Potassium (Completed)   Magnesium (Completed)   Sed Rate (ESR) (Completed)   CBC with Differential (Completed)   DG Foot Complete Left   DG Lumbar Spine Complete      Meds ordered this encounter  Medications  . methocarbamol (ROBAXIN) 500 MG tablet    Sig: Take one  tablet at bedtime PRN for muscle spasms    Dispense:  30 tablet    Refill:  0    Order Specific Question:   Supervising Provider    Answer:   Sallee Lange A [[id number]]    Xray lumbar spine related to bilateral leg and hip pain  Xray left foot related to nodule presence Labs ordered: Potassium, magnesium, Ferritin, B12 levels, Sed rate, CBC  Urine sample today in office negative Muscle Relaxer at night as needed for pain Discussed side effects of muscle relaxer with patient.  Educated patient to use caution with clonazepam and with ambulating after taking muscle relaxer. Do not drive while taking. Recommended patient to contact previous podiatrists for nail care when able Educated patient to seek treatment immediately if trouble holding bladder or bowel occurs Contact office with worsening sx See phone note below regarding this visit.   Follow up based on test results.

## 2020-11-18 NOTE — Telephone Encounter (Signed)
Patient was seen today with Whitney Velez

## 2020-11-19 LAB — FERRITIN: Ferritin: 71 ng/mL (ref 15–150)

## 2020-11-19 LAB — CBC WITH DIFFERENTIAL/PLATELET
Basophils Absolute: 0.1 10*3/uL (ref 0.0–0.2)
Basos: 1 %
EOS (ABSOLUTE): 1.1 10*3/uL — ABNORMAL HIGH (ref 0.0–0.4)
Eos: 11 %
Hematocrit: 40.5 % (ref 34.0–46.6)
Hemoglobin: 13.4 g/dL (ref 11.1–15.9)
Immature Grans (Abs): 0.1 10*3/uL (ref 0.0–0.1)
Immature Granulocytes: 1 %
Lymphocytes Absolute: 1.7 10*3/uL (ref 0.7–3.1)
Lymphs: 16 %
MCH: 29 pg (ref 26.6–33.0)
MCHC: 33.1 g/dL (ref 31.5–35.7)
MCV: 88 fL (ref 79–97)
Monocytes Absolute: 1 10*3/uL — ABNORMAL HIGH (ref 0.1–0.9)
Monocytes: 10 %
Neutrophils Absolute: 6.3 10*3/uL (ref 1.4–7.0)
Neutrophils: 61 %
Platelets: 299 10*3/uL (ref 150–450)
RBC: 4.62 x10E6/uL (ref 3.77–5.28)
RDW: 14.1 % (ref 11.7–15.4)
WBC: 10.2 10*3/uL (ref 3.4–10.8)

## 2020-11-19 LAB — VITAMIN B12: Vitamin B-12: 563 pg/mL (ref 232–1245)

## 2020-11-19 LAB — MAGNESIUM: Magnesium: 2 mg/dL (ref 1.6–2.3)

## 2020-11-19 LAB — POTASSIUM: Potassium: 4.2 mmol/L (ref 3.5–5.2)

## 2020-11-19 LAB — SEDIMENTATION RATE: Sed Rate: 18 mm/hr (ref 0–40)

## 2020-11-20 ENCOUNTER — Encounter: Payer: Self-pay | Admitting: Nurse Practitioner

## 2020-11-20 DIAGNOSIS — R413 Other amnesia: Secondary | ICD-10-CM | POA: Insufficient documentation

## 2020-11-20 DIAGNOSIS — R410 Disorientation, unspecified: Secondary | ICD-10-CM | POA: Insufficient documentation

## 2020-11-20 MED ORDER — SULFAMETHOXAZOLE-TRIMETHOPRIM 800-160 MG PO TABS: 1.0000 | ORAL_TABLET | Freq: Two times a day (BID) | ORAL | 0 refills | Status: DC

## 2020-11-21 ENCOUNTER — Other Ambulatory Visit: Payer: Self-pay

## 2020-11-21 ENCOUNTER — Ambulatory Visit (HOSPITAL_COMMUNITY)
Admission: RE | Admit: 2020-11-21 | Discharge: 2020-11-21 | Disposition: A | Discharge status: Home / Self Care | Payer: Medicare Other | Source: Ambulatory Visit | Attending: Nurse Practitioner | Admitting: Nurse Practitioner

## 2020-11-21 DIAGNOSIS — M79605 Pain in left leg: Secondary | ICD-10-CM | POA: Diagnosis present

## 2020-11-21 DIAGNOSIS — M79604 Pain in right leg: Secondary | ICD-10-CM | POA: Insufficient documentation

## 2020-11-21 DIAGNOSIS — M79671 Pain in right foot: Secondary | ICD-10-CM | POA: Insufficient documentation

## 2020-11-21 DIAGNOSIS — M62838 Other muscle spasm: Secondary | ICD-10-CM | POA: Diagnosis present

## 2020-11-21 DIAGNOSIS — M79672 Pain in left foot: Secondary | ICD-10-CM | POA: Insufficient documentation

## 2020-11-23 NOTE — Addendum Note (Signed)
Addended by: Vicente Males on: 11/23/2020 03:50 PM   Modules accepted: Orders

## 2020-11-23 NOTE — Progress Notes (Signed)
11/23/20- MRI Brain with contrast ordered

## 2020-12-06 ENCOUNTER — Other Ambulatory Visit: Payer: Self-pay | Admitting: *Deleted

## 2020-12-06 DIAGNOSIS — R410 Disorientation, unspecified: Secondary | ICD-10-CM

## 2020-12-06 DIAGNOSIS — R413 Other amnesia: Secondary | ICD-10-CM

## 2020-12-13 ENCOUNTER — Telehealth: Payer: Self-pay | Admitting: Family Medicine

## 2020-12-26 ENCOUNTER — Ambulatory Visit (HOSPITAL_COMMUNITY)
Admission: RE | Admit: 2020-12-26 | Discharge: 2020-12-26 | Disposition: A | Discharge status: Home / Self Care | Payer: Medicare Other | Source: Ambulatory Visit | Attending: Nurse Practitioner | Admitting: Nurse Practitioner

## 2020-12-26 ENCOUNTER — Other Ambulatory Visit: Payer: Self-pay

## 2020-12-26 ENCOUNTER — Other Ambulatory Visit: Payer: Self-pay | Admitting: Nurse Practitioner

## 2020-12-26 DIAGNOSIS — R413 Other amnesia: Secondary | ICD-10-CM | POA: Diagnosis not present

## 2020-12-26 DIAGNOSIS — R410 Disorientation, unspecified: Secondary | ICD-10-CM | POA: Insufficient documentation

## 2020-12-26 MED ORDER — GADOBUTROL 1 MMOL/ML IV SOLN
8.0000 mL | Freq: Once | INTRAVENOUS | Status: AC | PRN
  Administered 2020-12-26: 8 mL via INTRAVENOUS

## 2021-01-17 ENCOUNTER — Other Ambulatory Visit (HOSPITAL_COMMUNITY): Payer: Self-pay | Admitting: Hematology

## 2021-01-26 ENCOUNTER — Ambulatory Visit (INDEPENDENT_AMBULATORY_CARE_PROVIDER_SITE_OTHER): Payer: Medicare Other | Admitting: Family Medicine

## 2021-01-26 ENCOUNTER — Ambulatory Visit: Payer: Medicare Other | Admitting: Family Medicine

## 2021-01-26 ENCOUNTER — Encounter: Payer: Self-pay | Admitting: Family Medicine

## 2021-01-26 ENCOUNTER — Other Ambulatory Visit: Payer: Self-pay

## 2021-01-26 VITALS — BP 138/86 | HR 97 | Temp 97.2°F | Wt 168.4 lb

## 2021-01-26 DIAGNOSIS — R410 Disorientation, unspecified: Secondary | ICD-10-CM | POA: Diagnosis not present

## 2021-01-26 DIAGNOSIS — F0390 Unspecified dementia without behavioral disturbance: Secondary | ICD-10-CM

## 2021-01-26 DIAGNOSIS — R413 Other amnesia: Secondary | ICD-10-CM | POA: Diagnosis not present

## 2021-01-26 LAB — POCT URINALYSIS DIPSTICK
Spec Grav, UA: >=1.03 — AB (ref 1.010–1.025)
pH, UA: 5 (ref 5.0–8.0)

## 2021-01-26 NOTE — Progress Notes (Signed)
Patient ID: Whitney Velez, female    DOB: [birth year], 71 y.o.   MRN: [id number]   Chief Complaint  Patient presents with  . Memory Loss    Daughter brings patient in with concerns of worsening memory issues and confusion.  Daughter wants clarification on bp medication. Daughter unsure of when she last took chemo drug.    Subjective:    HPI  CC-memory/confusion concerns.  Pt seen today with daughter.  Came by herself a few months ago and thought uti and sores on legs.  Confused at the time and NP called the daughter to discuss the concerns.  Then thought memory not as good, duaghter not aware of the decline in memory.  Head ct was ordered and normal.  H/o breast cancer and remission. Confused about where places are and things happending that aren't . Not sure how to get to common places.  Not know where the car was was.  Not sure where she's going at times. Calling family crying and hysterical  Daughter taking keys and car.  Still working. Sitting CNA work.  At home is pleasantly confused. Confusing family members not recognizing them.  happening during chemo, in 2019. And worsening and she is done with chemo. Last 3 months. But week worsening. Seeing oncology every 6 mo.  Daughter is taking over her meds now, since pt isn't able to remember her meds now. Daughter thinking she may have been hiding the fact that she couldn't remember how to get to places or how to get home for a while.   Stop robaxin due to over sedation. Clonazepam, pt stating has run out.   MRI brain- 12/21-  IMPRESSION: Advanced white matter disease likely reflecting combination of therapy related and chronic microvascular changes.  No evidence of metastatic disease.  Medical History Whitney Velez has a past medical history of Asthma and Hypertension.   Outpatient Encounter Medications as of 01/26/2021  Medication Sig  . albuterol (VENTOLIN HFA) 108 (90 Base) MCG/ACT inhaler INHALE TWO PUFFS  INTO THE LUNGS EVERY SIX HOURS AS NEEDED FOR WHEEZING  . amLODipine (NORVASC) 5 MG tablet TAKE ONE (1) TABLET BY MOUTH EVERY DAY  . anastrozole (ARIMIDEX) 1 MG tablet TAKE ONE (1) TABLET BY MOUTH EVERY DAY  . anastrozole (ARIMIDEX) 1 MG tablet TAKE ONE (1) TABLET BY MOUTH EVERY DAY  . anastrozole (ARIMIDEX) 1 MG tablet TAKE ONE (1) TABLET BY MOUTH EVERY DAY  . aspirin 81 MG tablet Take 81 mg by mouth daily.  . Calcium Carb-Cholecalciferol (CALCIUM + VITAMIN D3 PO) Take 1 tablet by mouth 2 (two) times daily.  . clonazePAM (KLONOPIN) 0.5 MG tablet TAKE ONE-HALF TABLET BY MOUTH BID PRN ANXIETY  . denosumab (PROLIA) 60 MG/ML SOSY injection Inject 60 mg into the skin every 6 (six) months.  . diclofenac (VOLTAREN) 50 MG EC tablet Take one tablet po BID with food prn pain  . Flaxseed, Linseed, (FLAXSEED OIL) 1000 MG CAPS Take 1,000 mg by mouth daily.   Marland Kitchen FLOVENT HFA 44 MCG/ACT inhaler INHALE TWO PUFFS INTO THE LUNGS IN THE MORNING AND AT BEDTIME TO PREVENT WHEEZING  . hydrochlorothiazide (HYDRODIURIL) 25 MG tablet Take 25 mg by mouth daily. Verbal refill to [facility] per Dr. Richardson Landry. #[phone] with 3 rf  . ibuprofen (ADVIL) 200 MG tablet Take 200 mg by mouth at bedtime as needed.  . methocarbamol (ROBAXIN) 500 MG tablet Take one tablet at bedtime PRN for muscle spasms  . montelukast (SINGULAIR) 10 MG tablet TAKE ONE  TABLET (10MG TOTAL) BY MOUTH ATBEDTIME  . OVER THE COUNTER MEDICATION Stool softner once daily  . potassium chloride SA (KLOR-CON) 20 MEQ tablet TAKE ONE TABLET (20MEQ TOTAL) BY MOUTH TWO TIMES DAILY  . sulfamethoxazole-trimethoprim (BACTRIM DS) 800-160 MG tablet Take 1 tablet by mouth 2 (two) times daily.   No facility-administered encounter medications on file as of 01/26/2021.     Review of Systems  Constitutional: Negative for chills and fever.  HENT: Negative for congestion, rhinorrhea and sore throat.   Respiratory: Negative for cough, shortness of breath and wheezing.    Cardiovascular: Negative for chest pain and leg swelling.  Gastrointestinal: Negative for abdominal pain, diarrhea, nausea and vomiting.  Genitourinary: Negative for dysuria and frequency.  Musculoskeletal: Negative for arthralgias and back pain.  Skin: Negative for rash.  Neurological: Negative for dizziness, weakness and headaches.  Psychiatric/Behavioral: Positive for behavioral problems, confusion, dysphoric mood and sleep disturbance. Negative for suicidal ideas. The patient is nervous/anxious.        +memory deficit and confusion     Vitals BP 138/86   Pulse 97   Temp (!) 97.2 F (36.2 C)   Wt 168 lb 6.4 oz (76.4 kg)   SpO2 98%   BMI 32.89 kg/m   Objective:   Physical Exam Vitals and nursing note reviewed.  Constitutional:      Appearance: Normal appearance.  HENT:     Head: Normocephalic and atraumatic.     Nose: Nose normal.     Mouth/Throat:     Mouth: Mucous membranes are moist.     Pharynx: Oropharynx is clear.  Eyes:     Extraocular Movements: Extraocular movements intact.     Conjunctiva/sclera: Conjunctivae normal.     Pupils: Pupils are equal, round, and reactive to light.  Cardiovascular:     Rate and Rhythm: Normal rate and regular rhythm.     Pulses: Normal pulses.     Heart sounds: Normal heart sounds. No murmur heard.   Pulmonary:     Effort: Pulmonary effort is normal. No respiratory distress.     Breath sounds: Normal breath sounds. No wheezing, rhonchi or rales.  Musculoskeletal:        General: Normal range of motion.     Right lower leg: No edema.     Left lower leg: No edema.  Skin:    General: Skin is warm and dry.     Findings: No lesion or rash.  Neurological:     General: No focal deficit present.     Mental Status: She is alert and oriented to person, place, and time.     Cranial Nerves: No cranial nerve deficit.  Psychiatric:        Behavior: Behavior normal.     Comments: Dec memory, tearful and anxious      Assessment  and Plan   1. Confusion - CMP14+EGFR - CBC With Differential - Urine Culture - POCT urinalysis dipstick - B12 - TSH  2. Memory loss - CMP14+EGFR - CBC With Differential  3. Dementia without behavioral disturbance, unspecified dementia type ([place]) - Ambulatory referral to Psychology   UA- negative.  Will send for culture.   Ordered labs to get today. If all negative may recommend starting zoloft or other ssri to help with tearfulness/anxiety and depression.  Dementia/anxiety and depression- referral to psychology per family request.  In meantime recommending starting zoloft.  Recommended stopping the robaxin. Agree with taking away access to car and driving.  Will see pt  in 3 wks for recheck.

## 2021-01-27 LAB — CBC WITH DIFFERENTIAL
Basophils Absolute: 0.1 10*3/uL (ref 0.0–0.2)
Basos: 1 %
EOS (ABSOLUTE): 1 10*3/uL — ABNORMAL HIGH (ref 0.0–0.4)
Eos: 11 %
Hematocrit: 37.6 % (ref 34.0–46.6)
Hemoglobin: 13 g/dL (ref 11.1–15.9)
Immature Grans (Abs): 0 10*3/uL (ref 0.0–0.1)
Immature Granulocytes: 0 %
Lymphocytes Absolute: 2 10*3/uL (ref 0.7–3.1)
Lymphs: 22 %
MCH: 30 pg (ref 26.6–33.0)
MCHC: 34.6 g/dL (ref 31.5–35.7)
MCV: 87 fL (ref 79–97)
Monocytes Absolute: 0.9 10*3/uL (ref 0.1–0.9)
Monocytes: 10 %
Neutrophils Absolute: 5.4 10*3/uL (ref 1.4–7.0)
Neutrophils: 56 %
RBC: 4.33 x10E6/uL (ref 3.77–5.28)
RDW: 14.5 % (ref 11.7–15.4)
WBC: 9.4 10*3/uL (ref 3.4–10.8)

## 2021-01-27 LAB — CMP14+EGFR
ALT: 11 IU/L (ref 0–32)
AST: 19 IU/L (ref 0–40)
Albumin/Globulin Ratio: 1.4 (ref 1.2–2.2)
Albumin: 4.3 g/dL (ref 3.7–4.7)
Alkaline Phosphatase: 78 IU/L (ref 44–121)
BUN/Creatinine Ratio: 11 — ABNORMAL LOW (ref 12–28)
BUN: 12 mg/dL (ref 8–27)
Bilirubin Total: 0.2 mg/dL (ref 0.0–1.2)
CO2: 26 mmol/L (ref 20–29)
Calcium: 10.1 mg/dL (ref 8.7–10.3)
Chloride: 102 mmol/L (ref 96–106)
Creatinine, Ser: 1.09 mg/dL — ABNORMAL HIGH (ref 0.57–1.00)
GFR calc Af Amer: 59 mL/min/{1.73_m2} — ABNORMAL LOW (ref >59)
GFR calc non Af Amer: 51 mL/min/{1.73_m2} — ABNORMAL LOW (ref >59)
Globulin, Total: 3 g/dL (ref 1.5–4.5)
Glucose: 95 mg/dL (ref 65–99)
Potassium: 3.9 mmol/L (ref 3.5–5.2)
Sodium: 141 mmol/L (ref 134–144)
Total Protein: 7.3 g/dL (ref 6.0–8.5)

## 2021-01-27 LAB — TSH: TSH: 1.12 u[IU]/mL (ref 0.450–4.500)

## 2021-01-27 LAB — VITAMIN B12: Vitamin B-12: 392 pg/mL (ref 232–1245)

## 2021-01-28 LAB — URINE CULTURE

## 2021-01-28 LAB — SPECIMEN STATUS REPORT

## 2021-01-31 ENCOUNTER — Other Ambulatory Visit: Payer: Self-pay | Admitting: *Deleted

## 2021-01-31 MED ORDER — SERTRALINE HCL 50 MG PO TABS: ORAL_TABLET | ORAL | 0 refills | Status: DC

## 2021-02-01 ENCOUNTER — Telehealth: Payer: Self-pay

## 2021-02-01 NOTE — Telephone Encounter (Signed)
Please advise. Thank you

## 2021-02-01 NOTE — Telephone Encounter (Signed)
Memory loss got extremely worse she wonder out of her house last night did not know where she was her daughter said she is safe and she has moved her in with her. [name] said she has appt 18th does Dr Lovena Le want to see her sooner given the Circumstances    Call back [phone] [name]) daughter

## 2021-02-03 ENCOUNTER — Telehealth: Payer: Self-pay | Admitting: Family Medicine

## 2021-02-03 MED ORDER — CLONAZEPAM 0.5 MG PO TABS: ORAL_TABLET | ORAL | 1 refills | Status: DC

## 2021-02-03 NOTE — Telephone Encounter (Signed)
Pls call daughter to see how pt is doing?  Wanted to try the zoloft for her anxiety and depression.  Did she start it?  Dr. Lovena Le

## 2021-02-03 NOTE — Telephone Encounter (Signed)
See other message from today

## 2021-02-03 NOTE — Telephone Encounter (Signed)
Sorry this is going on.   We can try some Klonapin to help bid prn for anxiety. Especially if having more issues when it gets dark or evening time. But no quick resolution of this.  She needs 24 hr care to keep her from escaping and re-direction.  Alarms on the doors and getting a lock on the front door up high to keep her from trying to get out.  Or the alternative would be memory care facility.  We can also add namenda to help with memory, but since she is sounding pretty advanced, not sure how much this will help with her memory.   Let us know if they want to try klonapin and namenda to add.   Then f/u as scheduled.   Dr. Lovena Le

## 2021-02-03 NOTE — Telephone Encounter (Signed)
Called and discussed with pt's daughter and she verbalized understanding.

## 2021-02-03 NOTE — Telephone Encounter (Signed)
States every day memory is getting worse. Had to move her out of apartment because she was trying to leave at night. She is staying at her son's house and she is saying she does not know who her son is. States this is a total change for her mom. Today she took 3rd dose of zoloft.

## 2021-02-03 NOTE — Telephone Encounter (Signed)
Please advise. Thank you

## 2021-02-03 NOTE — Telephone Encounter (Signed)
We could try a trial of anxiety medication. To see if could calm her down.  Let us know if wanting to try Klonapin 2x per day for the anxiety.  But not sure any medication is going to keep her from escaping the house.  In instances like this pt usually need ot go to a locked facility, like nursing home.   Dr. Lovena Le

## 2021-02-03 NOTE — Telephone Encounter (Signed)
Discussed with pt's daughter and she wanted to try the klonopin and hold off on namenda. [facility].

## 2021-02-03 NOTE — Telephone Encounter (Signed)
Please read message from 02/01/21. Daughter [name] states patient is worst. She is needing some guidance on what to do to take care of [phone]

## 2021-02-16 ENCOUNTER — Other Ambulatory Visit: Payer: Self-pay

## 2021-02-16 ENCOUNTER — Ambulatory Visit (INDEPENDENT_AMBULATORY_CARE_PROVIDER_SITE_OTHER): Payer: Medicare Other | Admitting: Family Medicine

## 2021-02-16 VITALS — BP 128/86 | HR 95 | Temp 97.4°F | Ht 60.0 in | Wt 166.2 lb

## 2021-02-16 DIAGNOSIS — F0390 Unspecified dementia without behavioral disturbance: Secondary | ICD-10-CM

## 2021-02-16 DIAGNOSIS — M898X5 Other specified disorders of bone, thigh: Secondary | ICD-10-CM | POA: Diagnosis not present

## 2021-02-16 DIAGNOSIS — M25551 Pain in right hip: Secondary | ICD-10-CM

## 2021-02-16 DIAGNOSIS — F419 Anxiety disorder, unspecified: Secondary | ICD-10-CM

## 2021-02-16 DIAGNOSIS — R2681 Unsteadiness on feet: Secondary | ICD-10-CM

## 2021-02-16 NOTE — Progress Notes (Signed)
Patient ID: Whitney Velez, female    DOB: 1950/03/19, 71 y.o.   MRN: [id number]   Chief Complaint  Patient presents with  . Anxiety    Follow up on memory loss Daughter moved her in with her- some days are better than others- today a pretty good day  Mobility and appetite has significantly decreased   Subjective:    HPI  Pt seen for on going concern of anxiety/dementia. Living with daughter and brother now. Has been going down hill for a while.  Noticing in past 1-2 yrs.  That financial and checking. Day to day doesn't know where she is or son/daughter in law.  Doesn't recognize them. Thinking someone outside or broke into house. Trouble walking at times and are unsteady.  Rt hip hurting, radiating down the rt leg.  Sitting down a lot.  More walking today. Around 6pm seeing changes and sun downing symptoms. Thinking about 15 yrs back.  Talking about things that happened yrs ago and thinking were recent.  Counselor called her to see about memory. They are sitting up the medicines.  Alternating months. Pt is on zoloft and klonapin.  Night time waking up a lot and wandering through house. Not trying ot leave the house with her at daughter or son. Need script for rolling walker. Dec appetite.  Morning eating good but not as good later in day. Did ensure.  Has been giving- Klonopin 1/2 in am and 1/2 at night. Helping with the sundowning.  Pt to f/u with neurology for further evaluation and recommendations with the dementia.  Had bone scan, and in 2019, superior  SI joint and rt greater torchanter , suspicious for ossous mets at that time.  Repeat in 2020- PET scan and no suspicious lesions of metastatic disease.  Seeing Dr. Raliegh Ip, March 8th.- oncology for her routine f/u for breast cancer.   Medical History Gali has a past medical history of Asthma and Hypertension.   Outpatient Encounter Medications as of 02/16/2021  Medication Sig  . albuterol (VENTOLIN HFA) 108  (90 Base) MCG/ACT inhaler INHALE TWO PUFFS INTO THE LUNGS EVERY SIX HOURS AS NEEDED FOR WHEEZING  . amLODipine (NORVASC) 5 MG tablet TAKE ONE (1) TABLET BY MOUTH EVERY DAY  . anastrozole (ARIMIDEX) 1 MG tablet TAKE ONE (1) TABLET BY MOUTH EVERY DAY  . anastrozole (ARIMIDEX) 1 MG tablet TAKE ONE (1) TABLET BY MOUTH EVERY DAY  . anastrozole (ARIMIDEX) 1 MG tablet TAKE ONE (1) TABLET BY MOUTH EVERY DAY  . aspirin 81 MG tablet Take 81 mg by mouth daily.  . Calcium Carb-Cholecalciferol (CALCIUM + VITAMIN D3 PO) Take 1 tablet by mouth 2 (two) times daily.  Marland Kitchen denosumab (PROLIA) 60 MG/ML SOSY injection Inject 60 mg into the skin every 6 (six) months.  . diclofenac (VOLTAREN) 50 MG EC tablet Take one tablet po BID with food prn pain  . Flaxseed, Linseed, (FLAXSEED OIL) 1000 MG CAPS Take 1,000 mg by mouth daily.   Marland Kitchen FLOVENT HFA 44 MCG/ACT inhaler INHALE TWO PUFFS INTO THE LUNGS IN THE MORNING AND AT BEDTIME TO PREVENT WHEEZING  . hydrochlorothiazide (HYDRODIURIL) 25 MG tablet Take 25 mg by mouth daily. Verbal refill to [place] [facility] pharmacy per Dr. Richardson Landry. #90 with 3 rf  . ibuprofen (ADVIL) 200 MG tablet Take 200 mg by mouth at bedtime as needed.  . methocarbamol (ROBAXIN) 500 MG tablet Take one tablet at bedtime PRN for muscle spasms  . montelukast (SINGULAIR) 10 MG tablet TAKE ONE TABLET (  10MG  TOTAL) BY MOUTH ATBEDTIME  . OVER THE COUNTER MEDICATION Stool softner once daily  . potassium chloride SA (KLOR-CON) 20 MEQ tablet TAKE ONE TABLET (20MEQ TOTAL) BY MOUTH TWO TIMES DAILY  . sertraline (ZOLOFT) 50 MG tablet Take one half tablet daily for 7 days then go to one whole tablet  . sulfamethoxazole-trimethoprim (BACTRIM DS) 800-160 MG tablet Take 1 tablet by mouth 2 (two) times daily.  . [DISCONTINUED] clonazePAM (KLONOPIN) 0.5 MG tablet TAKE ONE-HALF TABLET BY MOUTH BID PRN ANXIETY   No facility-administered encounter medications on file as of 02/16/2021.     Review of Systems  Constitutional: Positive  for appetite change. Negative for chills and fever.  HENT: Negative for congestion, rhinorrhea and sore throat.   Respiratory: Negative for cough, shortness of breath and wheezing.   Cardiovascular: Negative for chest pain and leg swelling.  Gastrointestinal: Negative for abdominal pain, diarrhea, nausea and vomiting.  Genitourinary: Negative for dysuria and frequency.  Musculoskeletal: Negative for arthralgias and back pain.  Skin: Negative for rash.  Neurological: Negative for dizziness, weakness and headaches.  Psychiatric/Behavioral: Negative for dysphoric mood, hallucinations, self-injury, sleep disturbance and suicidal ideas. The patient is nervous/anxious.        +dec memory      Vitals BP 128/86   Pulse 95   Temp (!) 97.4 F (36.3 C) (Oral)   Ht 5' (1.524 m)   Wt 166 lb 3.2 oz (75.4 kg)   SpO2 98%   BMI 32.46 kg/m   Objective:   Physical Exam Vitals and nursing note reviewed.  Constitutional:      General: She is not in acute distress.    Appearance: Normal appearance.  HENT:     Head: Normocephalic and atraumatic.  Cardiovascular:     Rate and Rhythm: Normal rate and regular rhythm.     Pulses: Normal pulses.     Heart sounds: Normal heart sounds.  Pulmonary:     Effort: Pulmonary effort is normal.     Breath sounds: Normal breath sounds. No wheezing, rhonchi or rales.  Musculoskeletal:        General: Normal range of motion.     Right lower leg: No edema.     Left lower leg: No edema.  Skin:    General: Skin is warm and dry.     Findings: No lesion or rash.  Neurological:     General: No focal deficit present.     Mental Status: She is alert.     Cranial Nerves: No cranial nerve deficit.     Comments: +oriented to place, person  Psychiatric:        Mood and Affect: Mood normal.        Behavior: Behavior normal.        Thought Content: Thought content normal.        Judgment: Judgment normal.      Assessment and Plan   1. Dementia without  behavioral disturbance, unspecified dementia type ([place])  2. Right hip pain - DG Hip Unilat W OR W/O Pelvis 2-3 Views Right  3. Pain in right femur - DG FEMUR, MIN 2 VIEWS RIGHT  4. Gait instability  5. Anxiety   Daughter requesting new Gilford Rile- gave script for this.  - ordering xray of rt hip and rt femur.  - per last pet scan image 2020 there was concern of inc uptake in rt greater trochanter.  Cont to take medication for anxiety with the klonapin. And cont with f/u with  neuro.  F/u 97mo or prn.

## 2021-02-18 ENCOUNTER — Encounter: Payer: Self-pay | Admitting: Family Medicine

## 2021-02-22 ENCOUNTER — Other Ambulatory Visit: Payer: Self-pay | Admitting: Nurse Practitioner

## 2021-02-22 NOTE — Telephone Encounter (Signed)
Seen 02/16/21

## 2021-03-03 ENCOUNTER — Telehealth: Payer: Self-pay

## 2021-03-03 DIAGNOSIS — F0390 Unspecified dementia without behavioral disturbance: Secondary | ICD-10-CM

## 2021-03-03 NOTE — Telephone Encounter (Signed)
Yes, I thought was already in.  Sorry. Dr. Darene Lamer

## 2021-03-03 NOTE — Telephone Encounter (Signed)
Referral for neurology not in system. Do you want me to put it in?

## 2021-03-03 NOTE — Telephone Encounter (Signed)
Referral put in.

## 2021-03-03 NOTE — Telephone Encounter (Signed)
Daughter is calling to check on referral status on her mom for neurologist she was diagnosed with dementia and Dr Lovena Le was sending her for further test to get more information.   Call back [place]

## 2021-03-05 ENCOUNTER — Encounter: Payer: Self-pay | Admitting: Family Medicine

## 2021-03-06 ENCOUNTER — Other Ambulatory Visit (HOSPITAL_COMMUNITY): Payer: Self-pay

## 2021-03-06 DIAGNOSIS — C50811 Malignant neoplasm of overlapping sites of right female breast: Secondary | ICD-10-CM

## 2021-03-06 DIAGNOSIS — Z170 Estrogen receptor positive status [ER+]: Secondary | ICD-10-CM

## 2021-03-07 ENCOUNTER — Ambulatory Visit (HOSPITAL_COMMUNITY)
Admission: RE | Admit: 2021-03-07 | Discharge: 2021-03-07 | Disposition: A | Discharge status: Home / Self Care | Payer: Medicare Other | Source: Ambulatory Visit | Attending: Family Medicine | Admitting: Family Medicine

## 2021-03-07 ENCOUNTER — Inpatient Hospital Stay (HOSPITAL_BASED_OUTPATIENT_CLINIC_OR_DEPARTMENT_OTHER): Payer: Medicare Other | Admitting: Hematology

## 2021-03-07 ENCOUNTER — Inpatient Hospital Stay (HOSPITAL_COMMUNITY): Payer: Medicare Other

## 2021-03-07 ENCOUNTER — Inpatient Hospital Stay (HOSPITAL_COMMUNITY): Payer: Medicare Other | Attending: Hematology

## 2021-03-07 ENCOUNTER — Other Ambulatory Visit: Payer: Self-pay

## 2021-03-07 VITALS — BP 126/68 | HR 56 | Temp 97.9°F | Resp 18 | Wt 164.4 lb

## 2021-03-07 DIAGNOSIS — Z7951 Long term (current) use of inhaled steroids: Secondary | ICD-10-CM | POA: Diagnosis not present

## 2021-03-07 DIAGNOSIS — Z7982 Long term (current) use of aspirin: Secondary | ICD-10-CM | POA: Insufficient documentation

## 2021-03-07 DIAGNOSIS — Z79899 Other long term (current) drug therapy: Secondary | ICD-10-CM | POA: Insufficient documentation

## 2021-03-07 DIAGNOSIS — M898X5 Other specified disorders of bone, thigh: Secondary | ICD-10-CM | POA: Insufficient documentation

## 2021-03-07 DIAGNOSIS — Z79811 Long term (current) use of aromatase inhibitors: Secondary | ICD-10-CM | POA: Insufficient documentation

## 2021-03-07 DIAGNOSIS — Z170 Estrogen receptor positive status [ER+]: Secondary | ICD-10-CM | POA: Insufficient documentation

## 2021-03-07 DIAGNOSIS — F0390 Unspecified dementia without behavioral disturbance: Secondary | ICD-10-CM | POA: Diagnosis not present

## 2021-03-07 DIAGNOSIS — M79661 Pain in right lower leg: Secondary | ICD-10-CM | POA: Diagnosis not present

## 2021-03-07 DIAGNOSIS — M25551 Pain in right hip: Secondary | ICD-10-CM | POA: Diagnosis not present

## 2021-03-07 DIAGNOSIS — R531 Weakness: Secondary | ICD-10-CM | POA: Diagnosis not present

## 2021-03-07 DIAGNOSIS — C50911 Malignant neoplasm of unspecified site of right female breast: Secondary | ICD-10-CM | POA: Diagnosis not present

## 2021-03-07 DIAGNOSIS — M8580 Other specified disorders of bone density and structure, unspecified site: Secondary | ICD-10-CM | POA: Insufficient documentation

## 2021-03-07 DIAGNOSIS — C50811 Malignant neoplasm of overlapping sites of right female breast: Secondary | ICD-10-CM | POA: Diagnosis not present

## 2021-03-07 DIAGNOSIS — M79651 Pain in right thigh: Secondary | ICD-10-CM | POA: Diagnosis not present

## 2021-03-07 LAB — CBC WITH DIFFERENTIAL/PLATELET
Abs Immature Granulocytes: 0.05 10*3/uL (ref 0.00–0.07)
Basophils Absolute: 0 10*3/uL (ref 0.0–0.1)
Basophils Relative: 0 %
Eosinophils Absolute: 1 10*3/uL — ABNORMAL HIGH (ref 0.0–0.5)
Eosinophils Relative: 11 %
HCT: 38.3 % (ref 36.0–46.0)
Hemoglobin: 12.4 g/dL (ref 12.0–15.0)
Immature Granulocytes: 1 %
Lymphocytes Relative: 15 %
Lymphs Abs: 1.4 10*3/uL (ref 0.7–4.0)
MCH: 28.9 pg (ref 26.0–34.0)
MCHC: 32.4 g/dL (ref 30.0–36.0)
MCV: 89.3 fL (ref 80.0–100.0)
Monocytes Absolute: 1 10*3/uL (ref 0.1–1.0)
Monocytes Relative: 11 %
Neutro Abs: 5.5 10*3/uL (ref 1.7–7.7)
Neutrophils Relative %: 62 %
Platelets: 274 10*3/uL (ref 150–400)
RBC: 4.29 MIL/uL (ref 3.87–5.11)
RDW: 13.7 % (ref 11.5–15.5)
WBC: 8.9 10*3/uL (ref 4.0–10.5)
nRBC: 0 % (ref 0.0–0.2)

## 2021-03-07 LAB — COMPREHENSIVE METABOLIC PANEL
ALT: 18 U/L (ref 0–44)
AST: 24 U/L (ref 15–41)
Albumin: 4 g/dL (ref 3.5–5.0)
Alkaline Phosphatase: 73 U/L (ref 38–126)
Anion gap: 11 (ref 5–15)
BUN: 15 mg/dL (ref 8–23)
CO2: 26 mmol/L (ref 22–32)
Calcium: 10 mg/dL (ref 8.9–10.3)
Chloride: 97 mmol/L — ABNORMAL LOW (ref 98–111)
Creatinine, Ser: 1.01 mg/dL — ABNORMAL HIGH (ref 0.44–1.00)
GFR, Estimated: 59 mL/min — ABNORMAL LOW (ref >60)
Glucose, Bld: 83 mg/dL (ref 70–99)
Potassium: 3.7 mmol/L (ref 3.5–5.1)
Sodium: 134 mmol/L — ABNORMAL LOW (ref 135–145)
Total Bilirubin: 0.3 mg/dL (ref 0.3–1.2)
Total Protein: 8 g/dL (ref 6.5–8.1)

## 2021-03-07 LAB — VITAMIN B12: Vitamin B-12: 469 pg/mL (ref 180–914)

## 2021-03-07 LAB — LACTATE DEHYDROGENASE: LDH: 143 U/L (ref 98–192)

## 2021-03-07 LAB — VITAMIN D 25 HYDROXY (VIT D DEFICIENCY, FRACTURES): Vit D, 25-Hydroxy: 35.51 ng/mL (ref 30–100)

## 2021-03-07 LAB — FOLATE: Folate: 5.5 ng/mL — ABNORMAL LOW (ref >5.9)

## 2021-03-07 MED ORDER — SODIUM CHLORIDE 0.9 % IV SOLN: Freq: Once | INTRAVENOUS | Status: DC

## 2021-03-07 MED ORDER — DENOSUMAB 60 MG/ML ~~LOC~~ SOSY
PREFILLED_SYRINGE | SUBCUTANEOUS | Status: AC
  Filled 2021-03-07: qty 1

## 2021-03-07 MED ORDER — DENOSUMAB 60 MG/ML ~~LOC~~ SOSY
60.0000 mg | PREFILLED_SYRINGE | Freq: Once | SUBCUTANEOUS | Status: AC
  Administered 2021-03-07: 60 mg via SUBCUTANEOUS

## 2021-03-07 NOTE — Patient Instructions (Signed)
[place] at [facility] Discharge Instructions  You were seen today by Dr. Delton Coombes. He went over your recent results and scans. You received your Prolia injection today. Dr. Delton Coombes will see you back in 6 months for labs and follow up.   Thank you for choosing [place] at [facility] to provide your oncology and hematology care.  To afford each patient quality time with our provider, please arrive at least 15 minutes before your scheduled appointment time.   If you have a lab appointment with the [facility] please come in thru the Main Entrance and check in at the main information desk  You need to re-schedule your appointment should you arrive 10 or more minutes late.  We strive to give you quality time with our providers, and arriving late affects you and other patients whose appointments are after yours.  Also, if you no show three or more times for appointments you may be dismissed from the clinic at the providers discretion.     Again, thank you for choosing [facility].  Our hope is that these requests will decrease the amount of time that you wait before being seen by our physicians.       _____________________________________________________________  Should you have questions after your visit to [facility], please contact our office at [phone] between the hours of 8:00 a.m. and 4:30 p.m.  Voicemails left after 4:00 p.m. will not be returned until the following business day.  For prescription refill requests, have your pharmacy contact our office and allow 72 hours.    Cancer Center Support Programs:   > Cancer Support Group  2nd Tuesday of the month 1pm-2pm, Journey Room

## 2021-03-07 NOTE — Progress Notes (Signed)
Patient taking calcium as directed.  Denied tooth, jaw, and leg pain.  No recent or upcoming dental visits.  Labs reviewed.  Patient tolerated injection with no complaints voiced.  See MAR for details.  Patient stable during and after injection.  Site clean and dry with no bruising or swelling noted.  Band aid applied.  Vss with discharge and left ambulatory with no s/s of distress noted.  

## 2021-03-07 NOTE — Progress Notes (Signed)
[facility] [address], [place] [address]   Patient Care Team: Erven Colla, DO as PCP - General (Family Medicine)  SUMMARY OF ONCOLOGIC HISTORY: Oncology History  Breast cancer, right ([place])  05/12/2018 Initial Diagnosis   Breast cancer, right ([place])   06/05/2018 - 10/24/2018 Chemotherapy   The patient had DOXOrubicin (ADRIAMYCIN) chemo injection 110 mg, 60 mg/m2 = 110 mg, Intravenous,  Once, 4 of 4 cycles Administration: 110 mg (06/05/2018), 110 mg (06/19/2018), 110 mg (07/07/2018), 110 mg (07/21/2018) palonosetron (ALOXI) injection 0.25 mg, 0.25 mg, Intravenous,  Once, 4 of 4 cycles Administration: 0.25 mg (06/05/2018), 0.25 mg (06/19/2018), 0.25 mg (07/07/2018), 0.25 mg (07/21/2018) pegfilgrastim-cbqv (UDENYCA) injection 6 mg, 6 mg, Subcutaneous, Once, 4 of 4 cycles Administration: 6 mg (06/06/2018), 6 mg (06/20/2018), 6 mg (07/08/2018), 6 mg (07/22/2018) cyclophosphamide (CYTOXAN) 1,100 mg in sodium chloride 0.9 % 250 mL chemo infusion, 600 mg/m2 = 1,100 mg, Intravenous,  Once, 4 of 4 cycles Administration: 1,100 mg (06/05/2018), 1,100 mg (06/19/2018), 1,100 mg (07/07/2018), 1,100 mg (07/21/2018) PACLitaxel (TAXOL) 144 mg in sodium chloride 0.9 % 250 mL chemo infusion (</= 27m/m2), 80 mg/m2 = 144 mg, Intravenous,  Once, 12 of 12 cycles Administration: 144 mg (08/07/2018), 144 mg (08/14/2018), 144 mg (08/21/2018), 144 mg (08/28/2018), 144 mg (09/04/2018), 144 mg (09/11/2018), 144 mg (09/18/2018), 144 mg (09/25/2018), 144 mg (10/02/2018), 144 mg (10/09/2018), 144 mg (10/17/2018), 144 mg (10/24/2018) fosaprepitant (EMEND) 150 mg, dexamethasone (DECADRON) 12 mg in sodium chloride 0.9 % 145 mL IVPB, , Intravenous,  Once, 4 of 4 cycles Administration:  (06/05/2018),  (06/19/2018),  (07/07/2018),  (07/21/2018)  for chemotherapy treatment.      CHIEF COMPLIANT: Follow-up for right breast cancer   INTERVAL HISTORY: Ms. MCHENA CHOHANis a 71y.o. female here today for follow up of her right breast  cancer. Her last visit was with Whitney Finders NP, on 09/06/2020.   Today she is accompanied by her daughter and she reports feeling fair. She complains of having right hip pain; her pain is partially relieved with ibuprofen. The pain seemingly is worse after sitting for a long time; she has had the pain for several weeks. She uses a walker now since her gait is unsteady. Her daughter notes that she has declined both physically and mentally, to the point now that she no longer drives. She sometimes does not recognize her son or daughter. The memory issues started soon after Thanksgiving 2021 when she called her daughter and telling her that she was lost and did not know how to get home. She is taking Arimidex daily and tolerating it well; she is mixing her potassium in apple sauce and eating it.   She has been living with her daughter for the past 6 weeks. There is no family history of dementia. She is waiting to get a referral to neurology to work-up her memory and confusion issues.    REVIEW OF SYSTEMS:   Review of Systems  Constitutional: Positive for appetite change (50%) and fatigue (25%).  Respiratory: Positive for shortness of breath (w/ exertion).   Musculoskeletal: Positive for arthralgias (10/10 R hip pain).  Psychiatric/Behavioral: Positive for confusion. The patient is nervous/anxious.   All other systems reviewed and are negative.   I have reviewed the past medical history, past surgical history, social history and family history with the patient and they are unchanged from previous note.   ALLERGIES:   is allergic to penicillins, percocet [oxycodone-acetaminophen], and ranitidine.  MEDICATIONS:  Current Outpatient Medications  Medication Sig Dispense Refill  . albuterol (VENTOLIN HFA) 108 (90 Base) MCG/ACT inhaler INHALE TWO PUFFS INTO THE LUNGS EVERY SIX HOURS AS NEEDED FOR WHEEZING 8.5 g 2  . amLODipine (NORVASC) 5 MG tablet TAKE ONE (1) TABLET BY MOUTH EVERY DAY 90  tablet 1  . anastrozole (ARIMIDEX) 1 MG tablet TAKE ONE (1) TABLET BY MOUTH EVERY DAY 90 tablet 3  . anastrozole (ARIMIDEX) 1 MG tablet TAKE ONE (1) TABLET BY MOUTH EVERY DAY 30 tablet 3  . anastrozole (ARIMIDEX) 1 MG tablet TAKE ONE (1) TABLET BY MOUTH EVERY DAY 90 tablet 3  . aspirin 81 MG tablet Take 81 mg by mouth daily.    . Calcium Carb-Cholecalciferol (CALCIUM + VITAMIN D3 PO) Take 1 tablet by mouth 2 (two) times daily.    . clonazePAM (KLONOPIN) 0.5 MG tablet TAKE ONE-HALF TABLET BY MOUTH FOUR TIMESA DAY AS NEEDED FOR ANXIETY (MAX 2 TABLETS IN 24 HOURS) 60 tablet 2  . denosumab (PROLIA) 60 MG/ML SOSY injection Inject 60 mg into the skin every 6 (six) months.    . diclofenac (VOLTAREN) 50 MG EC tablet Take one tablet po BID with food prn pain 28 tablet 2  . Flaxseed, Linseed, (FLAXSEED OIL) 1000 MG CAPS Take 1,000 mg by mouth daily.     Marland Kitchen FLOVENT HFA 44 MCG/ACT inhaler INHALE TWO PUFFS INTO THE LUNGS IN THE MORNING AND AT BEDTIME TO PREVENT WHEEZING 10.6 g 2  . hydrochlorothiazide (HYDRODIURIL) 25 MG tablet Take 25 mg by mouth daily. Verbal refill to [facility] pharmacy per Dr. Richardson Landry. #90 with 3 rf    . ibuprofen (ADVIL) 200 MG tablet Take 200 mg by mouth at bedtime as needed.    . methocarbamol (ROBAXIN) 500 MG tablet Take one tablet at bedtime PRN for muscle spasms 30 tablet 0  . montelukast (SINGULAIR) 10 MG tablet TAKE ONE TABLET (10MG TOTAL) BY MOUTH ATBEDTIME 90 tablet 3  . OVER THE COUNTER MEDICATION Stool softner once daily    . potassium chloride SA (KLOR-CON) 20 MEQ tablet TAKE ONE TABLET (20MEQ TOTAL) BY MOUTH TWO TIMES DAILY 180 tablet 3  . sertraline (ZOLOFT) 50 MG tablet Take one half tablet daily for 7 days then go to one whole tablet 45 tablet 0  . sulfamethoxazole-trimethoprim (BACTRIM DS) 800-160 MG tablet Take 1 tablet by mouth 2 (two) times daily. 10 tablet 0   No current facility-administered medications for this visit.   Facility-Administered Medications Ordered in  Other Visits  Medication Dose Route Frequency Provider Last Rate Last Admin  . 0.9 %  sodium chloride infusion   Intravenous Once Lockamy, Whitney Velez         PHYSICAL EXAMINATION: Performance status (ECOG): 1 - Symptomatic but completely ambulatory  Vitals:   03/07/21 1458  BP: 126/68  Pulse: (!) 56  Resp: 18  Temp: 97.9 F (36.6 C)  SpO2: 99%   Wt Readings from Last 3 Encounters:  03/07/21 164 lb 6.4 oz (74.6 kg)  02/16/21 166 lb 3.2 oz (75.4 kg)  01/26/21 168 lb 6.4 oz (76.4 kg)   Physical Exam Vitals reviewed.  Constitutional:      Appearance: Normal appearance. She is obese.  Cardiovascular:     Rate and Rhythm: Normal rate and regular rhythm.     Pulses: Normal pulses.     Heart sounds: Normal heart sounds.  Pulmonary:     Effort: Pulmonary effort is normal.     Breath sounds:  Normal breath sounds.  Chest:  Breasts:     Right: Absent. No bleeding, mass, skin change, tenderness, axillary adenopathy or supraclavicular adenopathy.     Left: Absent. No bleeding, mass, skin change, tenderness, axillary adenopathy or supraclavicular adenopathy.    Abdominal:     Palpations: Abdomen is soft. There is no hepatomegaly or mass.     Tenderness: There is no abdominal tenderness.  Lymphadenopathy:     Upper Body:     Right upper body: No supraclavicular, axillary or pectoral adenopathy.     Left upper body: No supraclavicular, axillary or pectoral adenopathy.  Neurological:     General: No focal deficit present.     Mental Status: She is alert and oriented to person, place, and time.  Psychiatric:        Mood and Affect: Mood normal.        Behavior: Behavior normal.     Breast Exam Chaperone: Milinda Antis, MD     LABORATORY DATA:  I have reviewed the data as listed CMP Latest Ref Rng & Units 03/07/2021 01/26/2021 11/18/2020  Glucose 70 - 99 mg/dL 83 95 -  BUN 8 - 23 mg/dL 15 12 -  Creatinine 0.44 - 1.00 mg/dL 1.01(H) 1.09(H) -  Sodium 135 - 145 mmol/L  134(L) 141 -  Potassium 3.5 - 5.1 mmol/L 3.7 3.9 4.2  Chloride 98 - 111 mmol/L 97(L) 102 -  CO2 22 - 32 mmol/L 26 26 -  Calcium 8.9 - 10.3 mg/dL 10.0 10.1 -  Total Protein 6.5 - 8.1 g/dL 8.0 7.3 -  Total Bilirubin 0.3 - 1.2 mg/dL 0.3 0.2 -  Alkaline Phos 38 - 126 U/L 73 78 -  AST 15 - 41 U/L 24 19 -  ALT 0 - 44 U/L 18 11 -   No results found for: CAN153 Lab Results  Component Value Date   WBC 8.9 03/07/2021   HGB 12.4 03/07/2021   HCT 38.3 03/07/2021   MCV 89.3 03/07/2021   PLT 274 03/07/2021   NEUTROABS 5.5 03/07/2021   Lab Results  Component Value Date   LDH 143 03/07/2021   LDH 163 09/06/2020   LDH 161 02/25/2020   Lab Results  Component Value Date   VD25OH 29.99 (L) 09/06/2020   VD25OH 38.65 02/25/2020   VD25OH 25.51 (L) 12/03/2019    ASSESSMENT:  1.  Locally advanced (stage IIIa, T3N1) right breast cancer: - Mammogram on 03/28/2018 was BI-RADS Category 0.  Mammogram and ultrasound on 04/22/2018 shows 5.9 x 5 x 6 cm mass extending in the upper quadrant of the right breast and right axillary lymph node measuring 2.8 x 1.6 x 1.7 cm.  Left breast has retroareolar mass.  Patient did not have mammogram in the last 20 years. - Biopsy on 04/29/2018 of the right breast mass consistent with IDC, grade 2-3, lymph node biopsy positive for metastatic disease, ER/PR positive, HER-2 negative, Ki-67 of 5% -Seen by Dr. Arnoldo Morale who recommended right modified radical mastectomy and left simple mastectomy. - Bone scan showed  increased uptake in the right greater trochanter and right sacroiliac joint.  CT scan of the chest abdomen and pelvis did not show any evidence of metastatic disease.  The uptake in the right greater trochanter was thought to be due to enthesopathic change.  Uptake in the right sacroiliac joint was likely from degenerative changes at the right L5-S1 joint. - Due to advanced nature of her breast cancer, we have recommended neoadjuvant chemotherapy with dose dense AC  followed by weekly paclitaxel for 12 weeks. - 4 cycles of dose dense AC from 06/05/2018 through 07/21/2018. - 12 cycles of weekly paclitaxel from 08/07/2018 through 10/23/2018.  Weekly paclitaxel started on 08/07/2018.  - Last physical examination revealed 4 to 5 cm mass in the right breast, softer, freely mobile.  Very small lymph node palpable in the right axilla.  Left breast has nonspecific mass in the center. -Anastrozole started on 11/07/2018. - Right modified radical mastectomy on 12/01/2018 with left simple mastectomy. 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 -We have called pathology and requested ER/PR and HER-2/neu staining on the mastectomy.  This will be reported in 2 to 3 days. -She is complaining of weakness in her legs.  We will order physical therapy. - I have recommended doing a whole-body PET CT scan.  We will see her back in 3 to 4 weeks.  2.  Left breast DCIS: -Biopsy on 04/29/2018 of the left breast shows DCIS, ER/PR positive.  Left axillary lymph node biopsy was negative for malignancy. -Left simple mastectomy on 12/01/2018 shows 7 cm DCIS, intermediate grade, with cribriform, papillary and cystic morphology.  No evidence of invasion.  3.  Osteopenia: - DEXA scan on 11/13/2018 shows T score of -0.8.  She is taking calcium and vitamin D.    PLAN:  1.  Locally advanced (stage IIIa, T3N1) right breast cancer: -She is tolerating anastrozole very well. -Bilateral mastectomy sites are within normal limits.  No palpable adenopathy. -Reviewed labs which showed normal LFTs and CBC. -RTC 6 months for follow-up.  2.  Hypokalemia: -Potassium is normal today.  Continue potassium supplements.  3.  Osteopenia: -Continue Prolia today.  Calcium is normal.  4.   Dementia: -Daughter reports dementia which has progressed since Thanksgiving 2021. -MRI of the brain on 12/26/2020 did not show any metastatic disease. -She reports that she is in and out of confusion.  Neurology referral is pending.    No orders of the defined types were placed in this encounter.  The patient has a good understanding of the overall plan. she agrees with it. she will call with any problems that may develop before the next visit here.    Derek Jack, MD [place] [phone]   I, Milinda Antis, am acting as a scribe for Dr. Sanda Linger.  I, Derek Jack MD, have reviewed the above documentation for accuracy and completeness, and I agree with the above.

## 2021-03-14 ENCOUNTER — Other Ambulatory Visit: Payer: Self-pay | Admitting: Family Medicine

## 2021-03-20 ENCOUNTER — Telehealth: Payer: Self-pay | Admitting: Family Medicine

## 2021-03-20 MED ORDER — SERTRALINE HCL 100 MG PO TABS: ORAL_TABLET | ORAL | 1 refills | Status: DC

## 2021-03-20 NOTE — Telephone Encounter (Signed)
They need to increase the zoloft from 50mg  to 100mg  daily.  See if this helps, may take a little time to see changes.   Nurse- pls send in refill of 100mg  zoloft, 1 tab daily , #30, with 1 refill.   Thx.   Dr. Lovena Le

## 2021-03-20 NOTE — Telephone Encounter (Signed)
Left message to return call 

## 2021-03-20 NOTE — Telephone Encounter (Signed)
Daughter returned call and verbalized understanding. New dose of Zoloft sent in.

## 2021-03-20 NOTE — Telephone Encounter (Signed)
Daughter is calling about patient declining a lot having not stop crying spells but cant be seen at specialist office until 4/13 with neurologist . Daughter Whitney Velez wants to know if there is someone sooner she can see. She is taking her medications as prescribe but not seem to be helping. Please advise

## 2021-03-21 DIAGNOSIS — F482 Pseudobulbar affect: Secondary | ICD-10-CM | POA: Diagnosis not present

## 2021-03-21 DIAGNOSIS — G309 Alzheimer's disease, unspecified: Secondary | ICD-10-CM | POA: Diagnosis not present

## 2021-03-21 DIAGNOSIS — Z79899 Other long term (current) drug therapy: Secondary | ICD-10-CM | POA: Diagnosis not present

## 2021-03-21 DIAGNOSIS — I679 Cerebrovascular disease, unspecified: Secondary | ICD-10-CM | POA: Diagnosis not present

## 2021-03-21 DIAGNOSIS — R2689 Other abnormalities of gait and mobility: Secondary | ICD-10-CM | POA: Diagnosis not present

## 2021-04-13 ENCOUNTER — Other Ambulatory Visit: Payer: Self-pay | Admitting: *Deleted

## 2021-04-13 ENCOUNTER — Other Ambulatory Visit: Payer: Self-pay

## 2021-04-13 MED ORDER — HYDROCHLOROTHIAZIDE 25 MG PO TABS: 25.0000 mg | ORAL_TABLET | Freq: Every day | ORAL | 0 refills | Status: DC

## 2021-04-13 NOTE — Progress Notes (Signed)
Sent refill hctz. Dr. Lovena Le

## 2021-04-19 ENCOUNTER — Telehealth: Payer: Self-pay

## 2021-04-19 NOTE — Telephone Encounter (Signed)
Form placed in Dr Lovena Le signature folder to be completed from [facility] [place]   Pt call back [phone]

## 2021-04-24 ENCOUNTER — Encounter: Payer: Self-pay | Admitting: Family Medicine

## 2021-04-24 DIAGNOSIS — F0390 Unspecified dementia without behavioral disturbance: Secondary | ICD-10-CM | POA: Insufficient documentation

## 2021-04-24 DIAGNOSIS — F03918 Unspecified dementia, unspecified severity, with other behavioral disturbance: Secondary | ICD-10-CM | POA: Insufficient documentation

## 2021-04-25 NOTE — Telephone Encounter (Signed)
Gave form to betzy.  Thx. Dr. Lovena Le

## 2021-04-26 ENCOUNTER — Other Ambulatory Visit: Payer: Self-pay

## 2021-04-26 ENCOUNTER — Encounter: Payer: Self-pay | Admitting: Family Medicine

## 2021-04-26 ENCOUNTER — Ambulatory Visit (INDEPENDENT_AMBULATORY_CARE_PROVIDER_SITE_OTHER): Payer: Medicare Other | Admitting: Family Medicine

## 2021-04-26 VITALS — BP 143/76 | HR 69 | Temp 97.2°F | Ht 60.0 in | Wt 160.0 lb

## 2021-04-26 DIAGNOSIS — F419 Anxiety disorder, unspecified: Secondary | ICD-10-CM | POA: Diagnosis not present

## 2021-04-26 DIAGNOSIS — I10 Essential (primary) hypertension: Secondary | ICD-10-CM

## 2021-04-26 DIAGNOSIS — F0390 Unspecified dementia without behavioral disturbance: Secondary | ICD-10-CM | POA: Diagnosis not present

## 2021-04-26 DIAGNOSIS — Z79899 Other long term (current) drug therapy: Secondary | ICD-10-CM | POA: Diagnosis not present

## 2021-04-26 MED ORDER — CLONAZEPAM 0.5 MG PO TABS
ORAL_TABLET | ORAL | 2 refills | Status: DC
Start: 2021-04-26 — End: 2021-09-11

## 2021-04-26 NOTE — Progress Notes (Signed)
Patient ID: Whitney Velez, female    DOB: 1950-05-28, 71 y.o.   MRN: [id number]   Chief Complaint  Patient presents with  . Anxiety    Depression, sleep, dementia follow up    Subjective:    HPI  F/u anxiety, depression, dementia, insomnia.  Seen today with daughter.  Having moderate to severe stage of dementia. Stopped the zoloft. And started the another medication, nameda went 1 tab per day then inc 2x per day. Not much short term memory at this time per daughter, asking lots quiestions reptitively.  Noticing some agression if asking about working, going home, or driving.  Living with daughter since 1/22.  Last few weeks more aggression. Talking about wanting to die and wanting to shoot herself with a gun.  Seeing Dr. Merlene Velez, neuro next week. Saying it a few times per week.  With correction, she doesn't continue to say it.   Sleep is not good and then has some agitation after a few days, then one day will sleep for 14-15 hrs every 4-5 days.  Wandering the house and not sure what is happening. fidgety and moving around. Seems for confused and antsy.  [facility]. And if she is doing well, may increase to 3x per week.  Has tried it one day and liked all the activities.  Neuro okay with 1/2 in am and pm for clonazepam. Around 3-4pm having some sundowning. Not as much tearfulness now.   Medical History Whitney Velez has a past medical history of Asthma and Hypertension.   Outpatient Encounter Medications as of 04/26/2021  Medication Sig  . albuterol (VENTOLIN HFA) 108 (90 Base) MCG/ACT inhaler INHALE TWO PUFFS INTO THE LUNGS EVERY SIX HOURS AS NEEDED FOR WHEEZING  . amLODipine (NORVASC) 5 MG tablet TAKE ONE (1) TABLET BY MOUTH EVERY DAY  . anastrozole (ARIMIDEX) 1 MG tablet TAKE ONE (1) TABLET BY MOUTH EVERY DAY  . anastrozole (ARIMIDEX) 1 MG tablet TAKE ONE (1) TABLET BY MOUTH EVERY DAY  . anastrozole (ARIMIDEX) 1 MG tablet TAKE ONE  (1) TABLET BY MOUTH EVERY DAY  . aspirin 81 MG tablet Take 81 mg by mouth daily.  . Calcium Carb-Cholecalciferol (CALCIUM + VITAMIN D3 PO) Take 1 tablet by mouth 2 (two) times daily.  . clonazePAM (KLONOPIN) 0.5 MG tablet Take 1/2 tab p.o. in am and then 1 tab p.o. qhs. prn FOR ANXIETY (MAX 2 TABLETS IN 24 HOURS)  . denosumab (PROLIA) 60 MG/ML SOSY injection Inject 60 mg into the skin every 6 (six) months.  . Dextromethorphan-quiNIDine (NUEDEXTA) 20-10 MG capsule Nuedexta 20 mg-10 mg capsule  Take 1 capsule every 12 hours by oral route.  1 daily for 1 month and then increase to 1 twice a day  . FLOVENT HFA 44 MCG/ACT inhaler INHALE TWO PUFFS INTO THE LUNGS IN THE MORNING AND AT BEDTIME TO PREVENT WHEEZING  . hydrochlorothiazide (HYDRODIURIL) 25 MG tablet Take 1 tablet (25 mg total) by mouth daily.  Marland Kitchen ibuprofen (ADVIL) 200 MG tablet Take 200 mg by mouth at bedtime as needed.  . montelukast (SINGULAIR) 10 MG tablet TAKE ONE TABLET (10MG  TOTAL) BY MOUTH ATBEDTIME  . OVER THE COUNTER MEDICATION Stool softner once daily  . potassium chloride SA (KLOR-CON) 20 MEQ tablet TAKE ONE TABLET (20MEQ TOTAL) BY MOUTH TWO TIMES DAILY  . [DISCONTINUED] clonazePAM (KLONOPIN) 0.5 MG tablet TAKE ONE-HALF TABLET BY MOUTH FOUR TIMESA DAY AS NEEDED FOR ANXIETY (MAX 2 TABLETS IN  24 HOURS)  . [DISCONTINUED] diclofenac (VOLTAREN) 50 MG EC tablet Take one tablet po BID with food prn pain  . [DISCONTINUED] Flaxseed, Linseed, (FLAXSEED OIL) 1000 MG CAPS Take 1,000 mg by mouth daily.   . [DISCONTINUED] methocarbamol (ROBAXIN) 500 MG tablet Take one tablet at bedtime PRN for muscle spasms  . [DISCONTINUED] sertraline (ZOLOFT) 100 MG tablet Take 1 tab p.o. daily.  . [DISCONTINUED] sulfamethoxazole-trimethoprim (BACTRIM DS) 800-160 MG tablet Take 1 tablet by mouth 2 (two) times daily.   No facility-administered encounter medications on file as of 04/26/2021.     Review of Systems  Constitutional: Negative for chills and  fever.  HENT: Negative for congestion, rhinorrhea and sore throat.   Respiratory: Negative for cough, shortness of breath and wheezing.   Cardiovascular: Negative for chest pain and leg swelling.  Gastrointestinal: Negative for abdominal pain, diarrhea, nausea and vomiting.  Genitourinary: Negative for dysuria and frequency.  Musculoskeletal: Negative for arthralgias and back pain.  Skin: Negative for rash.  Neurological: Negative for dizziness, weakness and headaches.  Psychiatric/Behavioral: Positive for confusion (h/o dementia), dysphoric mood and sleep disturbance. Negative for agitation, self-injury and suicidal ideas. The patient is nervous/anxious.      Vitals BP (!) 143/76   Pulse 69   Temp (!) 97.2 F (36.2 C)   Ht 5' (1.524 m)   Wt 160 lb (72.6 kg)   SpO2 97%   BMI 31.25 kg/m   Objective:   Physical Exam Vitals and nursing note reviewed.  Constitutional:      General: She is not in acute distress.    Appearance: Normal appearance. She is not ill-appearing.  HENT:     Head: Normocephalic and atraumatic.  Cardiovascular:     Rate and Rhythm: Normal rate and regular rhythm.     Pulses: Normal pulses.     Heart sounds: Normal heart sounds.  Pulmonary:     Effort: Pulmonary effort is normal.     Breath sounds: Normal breath sounds. No wheezing, rhonchi or rales.  Musculoskeletal:        General: Normal range of motion.     Right lower leg: No edema.     Left lower leg: No edema.  Skin:    General: Skin is warm and dry.     Findings: No lesion or rash.  Neurological:     General: No focal deficit present.     Mental Status: She is alert.     Cranial Nerves: No cranial nerve deficit.     Comments: Oriented to person  Psychiatric:        Behavior: Behavior normal.        Thought Content: Thought content normal.     Comments: +tearful at times on exam.      Assessment and Plan   1. Dementia without behavioral disturbance, unspecified dementia type  (HCC) - clonazePAM (KLONOPIN) 0.5 MG tablet; Take 1/2 tab p.o. in am and then 1 tab p.o. qhs. prn FOR ANXIETY (MAX 2 TABLETS IN 24 HOURS)  Dispense: 60 tablet; Refill: 2  2. Essential hypertension, benign  3. Anxiety   H/o dementia- moderate to severe. Seeing Dr. Merlene Velez, Neuro. Started namenda, seeing some improvement, but noticing some agitation.  Anxiety/sundowning- Will increase klonapin to 1/2 tab in am and 1 tab in pm for insomnia/anxiety.  Daughter able to give additional 1/2 tab in afternoon prn for anxiety/sundowning.  htn- stable. Cont with hctz and amlodipine.  Return in about 3 months (around 07/26/2021) for f/u dementia,  insomnia, anxiety.

## 2021-05-16 DIAGNOSIS — R2689 Other abnormalities of gait and mobility: Secondary | ICD-10-CM | POA: Diagnosis not present

## 2021-05-16 DIAGNOSIS — F482 Pseudobulbar affect: Secondary | ICD-10-CM | POA: Diagnosis not present

## 2021-05-16 DIAGNOSIS — G309 Alzheimer's disease, unspecified: Secondary | ICD-10-CM | POA: Diagnosis not present

## 2021-05-16 DIAGNOSIS — I679 Cerebrovascular disease, unspecified: Secondary | ICD-10-CM | POA: Diagnosis not present

## 2021-05-16 DIAGNOSIS — Z79899 Other long term (current) drug therapy: Secondary | ICD-10-CM | POA: Diagnosis not present

## 2021-05-26 ENCOUNTER — Other Ambulatory Visit: Payer: Self-pay | Admitting: Nurse Practitioner

## 2021-06-03 ENCOUNTER — Other Ambulatory Visit: Payer: Self-pay | Admitting: Family Medicine

## 2021-06-21 ENCOUNTER — Other Ambulatory Visit: Payer: Self-pay

## 2021-06-21 MED ORDER — MONTELUKAST SODIUM 10 MG PO TABS: ORAL_TABLET | ORAL | 0 refills | Status: DC

## 2021-07-04 ENCOUNTER — Telehealth: Payer: Self-pay | Admitting: Family Medicine

## 2021-07-04 NOTE — Telephone Encounter (Signed)
Please schedule appt to discuss referral

## 2021-07-04 NOTE — Telephone Encounter (Signed)
Patient daughter is requesting physical therapy referral because patient is having problem with moving her feet with walker. She goes to adult  daycare and she needs to be able to get around please advise

## 2021-07-08 ENCOUNTER — Emergency Department (HOSPITAL_COMMUNITY): Payer: Medicare Other

## 2021-07-08 ENCOUNTER — Encounter (HOSPITAL_COMMUNITY): Payer: Self-pay | Admitting: Hematology

## 2021-07-08 ENCOUNTER — Other Ambulatory Visit: Payer: Self-pay

## 2021-07-08 ENCOUNTER — Emergency Department (HOSPITAL_COMMUNITY)
Admission: EM | Admit: 2021-07-08 | Discharge: 2021-07-08 | Disposition: A | Discharge status: Home / Self Care | Payer: Medicare Other | Attending: Emergency Medicine | Admitting: Emergency Medicine

## 2021-07-08 ENCOUNTER — Encounter (HOSPITAL_COMMUNITY): Payer: Self-pay | Admitting: *Deleted

## 2021-07-08 DIAGNOSIS — R531 Weakness: Secondary | ICD-10-CM

## 2021-07-08 DIAGNOSIS — F0150 Vascular dementia without behavioral disturbance: Secondary | ICD-10-CM

## 2021-07-08 DIAGNOSIS — I517 Cardiomegaly: Secondary | ICD-10-CM | POA: Diagnosis not present

## 2021-07-08 DIAGNOSIS — R410 Disorientation, unspecified: Secondary | ICD-10-CM | POA: Diagnosis not present

## 2021-07-08 DIAGNOSIS — J45909 Unspecified asthma, uncomplicated: Secondary | ICD-10-CM | POA: Diagnosis not present

## 2021-07-08 DIAGNOSIS — E119 Type 2 diabetes mellitus without complications: Secondary | ICD-10-CM | POA: Insufficient documentation

## 2021-07-08 DIAGNOSIS — M25561 Pain in right knee: Secondary | ICD-10-CM | POA: Diagnosis not present

## 2021-07-08 DIAGNOSIS — Z79899 Other long term (current) drug therapy: Secondary | ICD-10-CM | POA: Diagnosis not present

## 2021-07-08 DIAGNOSIS — Z7982 Long term (current) use of aspirin: Secondary | ICD-10-CM | POA: Diagnosis not present

## 2021-07-08 DIAGNOSIS — I10 Essential (primary) hypertension: Secondary | ICD-10-CM | POA: Insufficient documentation

## 2021-07-08 DIAGNOSIS — Z20822 Contact with and (suspected) exposure to covid-19: Secondary | ICD-10-CM | POA: Insufficient documentation

## 2021-07-08 DIAGNOSIS — Z853 Personal history of malignant neoplasm of breast: Secondary | ICD-10-CM | POA: Diagnosis not present

## 2021-07-08 LAB — CBC WITH DIFFERENTIAL/PLATELET
Abs Immature Granulocytes: 0.03 10*3/uL (ref 0.00–0.07)
Basophils Absolute: 0 10*3/uL (ref 0.0–0.1)
Basophils Relative: 0 %
Eosinophils Absolute: 1.5 10*3/uL — ABNORMAL HIGH (ref 0.0–0.5)
Eosinophils Relative: 14 %
HCT: 38.8 % (ref 36.0–46.0)
Hemoglobin: 12.8 g/dL (ref 12.0–15.0)
Immature Granulocytes: 0 %
Lymphocytes Relative: 11 %
Lymphs Abs: 1.1 10*3/uL (ref 0.7–4.0)
MCH: 28.8 pg (ref 26.0–34.0)
MCHC: 33 g/dL (ref 30.0–36.0)
MCV: 87.2 fL (ref 80.0–100.0)
Monocytes Absolute: 1.3 10*3/uL — ABNORMAL HIGH (ref 0.1–1.0)
Monocytes Relative: 13 %
Neutro Abs: 6.2 10*3/uL (ref 1.7–7.7)
Neutrophils Relative %: 62 %
Platelets: 277 10*3/uL (ref 150–400)
RBC: 4.45 MIL/uL (ref 3.87–5.11)
RDW: 15 % (ref 11.5–15.5)
WBC: 10.1 10*3/uL (ref 4.0–10.5)
nRBC: 0 % (ref 0.0–0.2)

## 2021-07-08 LAB — URINALYSIS, ROUTINE W REFLEX MICROSCOPIC
Bilirubin Urine: NEGATIVE
Glucose, UA: NEGATIVE mg/dL
Hgb urine dipstick: NEGATIVE
Ketones, ur: NEGATIVE mg/dL
Leukocytes,Ua: NEGATIVE
Nitrite: NEGATIVE
Protein, ur: NEGATIVE mg/dL
Specific Gravity, Urine: 1.016 (ref 1.005–1.030)
pH: 6 (ref 5.0–8.0)

## 2021-07-08 LAB — COMPREHENSIVE METABOLIC PANEL
ALT: 36 U/L (ref 0–44)
AST: 43 U/L — ABNORMAL HIGH (ref 15–41)
Albumin: 4 g/dL (ref 3.5–5.0)
Alkaline Phosphatase: 74 U/L (ref 38–126)
Anion gap: 10 (ref 5–15)
BUN: 23 mg/dL (ref 8–23)
CO2: 26 mmol/L (ref 22–32)
Calcium: 10 mg/dL (ref 8.9–10.3)
Chloride: 101 mmol/L (ref 98–111)
Creatinine, Ser: 1.05 mg/dL — ABNORMAL HIGH (ref 0.44–1.00)
GFR, Estimated: 56 mL/min — ABNORMAL LOW (ref >60)
Glucose, Bld: 91 mg/dL (ref 70–99)
Potassium: 3.5 mmol/L (ref 3.5–5.1)
Sodium: 137 mmol/L (ref 135–145)
Total Bilirubin: 0.3 mg/dL (ref 0.3–1.2)
Total Protein: 8.4 g/dL — ABNORMAL HIGH (ref 6.5–8.1)

## 2021-07-08 LAB — RESP PANEL BY RT-PCR (FLU A&B, COVID) ARPGX2
Influenza A by PCR: NEGATIVE
Influenza B by PCR: NEGATIVE
SARS Coronavirus 2 by RT PCR: NEGATIVE

## 2021-07-08 NOTE — Discharge Instructions (Addendum)
Please follow-up with our case management team for getting home health, physical therapy and outpatient social work set up.  Please follow-up with your primary doctor and your neurologist.  If she has worsening weakness, falls or other new concerning symptom, come back to ER.

## 2021-07-08 NOTE — ED Notes (Signed)
Assisted RN with a in and out around 3.10

## 2021-07-08 NOTE — ED Provider Notes (Addendum)
[facility] EMERGENCY DEPARTMENT Provider Note   CSN: [id number] Arrival date & time: 07/08/21  1318     History Chief Complaint  Patient presents with   Weakness    Whitney Velez is a 71 y.o. female.  Presents to the ER with concern for weakness.  Patient reports that over the past couple days patient has noted to be somewhat more confused and having increased generalized weakness.  Having more difficulty walking independently and requiring more assistance than normal.  Patient at time of questioning denies any specific pain but daughter reports that she had previously reported that her legs hurt.  No known injuries,  Has past medical history most notable for vascular dementia, followed closely by neurology, Dr. Merlene Laughter.  Also per review of chart has history of breast cancer, diabetes, hypertension.  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[place]) 02/20/2015   Essential hypertension, benign 12/16/2013   Type 2 diabetes mellitus ([place]) 12/16/2013    Past Surgical History:  Procedure Laterality Date   ABDOMINAL HYSTERECTOMY     MASTECTOMY MODIFIED RADICAL Right 12/01/2018   Procedure: RIGHT MODIFIED RADICAL MASTECTOMY (Procedure #1);  Surgeon: Aviva Signs, MD;  Location: [facility];  Service: General;  Laterality: Right;   PORTACATH PLACEMENT Left 06/04/2018   Procedure: INSERTION PORT-A-CATH;  Surgeon: Aviva Signs, MD;   Location: [facility];  Service: General;  Laterality: Left;   SIMPLE MASTECTOMY WITH AXILLARY SENTINEL NODE BIOPSY Left 12/01/2018   Procedure: LEFT SIMPLE MASTECTOMY (Procedure #2);  Surgeon: Aviva Signs, MD;  Location: [facility];  Service: General;  Laterality: Left;     OB History   No obstetric history on file.     History reviewed. No pertinent family history.  Social History   Tobacco Use   Smoking status: Never   Smokeless tobacco: Never  Vaping Use   Vaping Use: Never used  Substance Use Topics   Alcohol use: Never   Drug use: Never    Home Medications Prior to Admission medications   Medication Sig Start Date End Date Taking? Authorizing Provider  albuterol (VENTOLIN HFA) 108 (90 Base) MCG/ACT inhaler INHALE TWO PUFFS INTO THE LUNGS EVERY SIX HOURS AS NEEDED FOR WHEEZING 11/08/20   Lovena Le, Malena M, DO  amLODipine (NORVASC) 5 MG tablet TAKE ONE (1) TABLET BY MOUTH EVERY DAY 06/05/21   Lovena Le, Malena M, DO  anastrozole (ARIMIDEX) 1 MG tablet TAKE ONE (1) TABLET BY MOUTH EVERY DAY 08/16/20   Lockamy, Randi L, NP-C  anastrozole (ARIMIDEX) 1 MG tablet TAKE ONE (1) TABLET BY MOUTH EVERY DAY 08/15/20   Derek Jack, MD  anastrozole (ARIMIDEX) 1 MG tablet TAKE ONE (1) TABLET BY MOUTH EVERY DAY 01/17/21   Derek Jack, MD  aspirin 81 MG tablet Take 81 mg by mouth daily.    [provider]  Calcium Carb-Cholecalciferol (CALCIUM + VITAMIN D3 PO) Take 1 tablet  by mouth 2 (two) times daily.    [provider]  clonazePAM (KLONOPIN) 0.5 MG tablet Take 1/2 tab p.o. in am and then 1 tab p.o. qhs. prn FOR ANXIETY (MAX 2 TABLETS IN 24 HOURS) 04/26/21   Lovena Le, Malena M, DO  denosumab (PROLIA) 60 MG/ML SOSY injection Inject 60 mg into the skin every 6 (six) months.    [provider]  Dextromethorphan-quiNIDine (NUEDEXTA) 20-10 MG capsule Nuedexta 20 mg-10 mg capsule  Take 1 capsule every 12 hours by oral route.  1 daily for 1 month and then increase to 1  twice a day    [provider]  FLOVENT HFA 44 MCG/ACT inhaler INHALE TWO PUFFS INTO THE LUNGS IN THE MORNING AND AT BEDTIME TO PREVENT WHEEZING 11/08/20   Elvia Collum M, DO  hydrochlorothiazide (HYDRODIURIL) 25 MG tablet Take 1 tablet (25 mg total) by mouth daily. 04/13/21   Elvia Collum M, DO  ibuprofen (ADVIL) 200 MG tablet Take 200 mg by mouth at bedtime as needed.    [provider]  montelukast (SINGULAIR) 10 MG tablet TAKE ONE TABLET (10MG  TOTAL) BY MOUTH ATBEDTIME 06/21/21   Lovena Le, Malena M, DO  OVER THE COUNTER MEDICATION Stool softner once daily    [provider]  potassium chloride SA (KLOR-CON) 20 MEQ tablet TAKE ONE TABLET (20MEQ TOTAL) BY MOUTH TWO TIMES DAILY 11/07/20   Derek Jack, MD    Allergies    Penicillins, Percocet [oxycodone-acetaminophen], and Ranitidine  Review of Systems   Review of Systems  Unable to perform ROS: Dementia   Physical Exam Updated Vital Signs BP 128/85   Pulse 64   Temp 99.1 F (37.3 C) (Oral)   Resp 18   Ht 4\' 11"  (1.499 m)   Wt 74.8 kg   SpO2 96%   BMI 33.33 kg/m   Physical Exam Vitals and nursing note reviewed.  Constitutional:      General: She is not in acute distress.    Appearance: She is well-developed.  HENT:     Head: Normocephalic and atraumatic.  Eyes:     Conjunctiva/sclera: Conjunctivae normal.  Cardiovascular:     Rate and Rhythm: Normal rate and regular rhythm.     Heart sounds: No murmur heard. Pulmonary:     Effort: Pulmonary effort is normal. No respiratory distress.     Breath sounds: Normal breath sounds.  Abdominal:     Palpations: Abdomen is soft.     Tenderness: There is no abdominal tenderness.  Musculoskeletal:     Cervical back: Neck supple.     Comments: RLE: there is superficial abrasion to knee, some TTP, normal distal pulse and sensation LLE: no TTP throughout RUE: no TTP throughout LUE: no TTP throughout   Skin:    General: Skin is warm and dry.   Neurological:     Mental Status: She is alert.     Comments: Alert, oriented to person, cranial nerves II through XII intact, 5 of 5 strength in upper and lower extremities, normal sensation to light touch in upper and lower extremities, normal finger-nose-finger    ED Results / Procedures / Treatments   Labs (all labs ordered are listed, but only abnormal results are displayed) Labs Reviewed  CBC WITH DIFFERENTIAL/PLATELET - Abnormal; Notable for the following components:      Result Value   Monocytes Absolute 1.3 (*)    Eosinophils Absolute 1.5 (*)    All other components within normal limits  COMPREHENSIVE METABOLIC PANEL -  Abnormal; Notable for the following components:   Creatinine, Ser 1.05 (*)    Total Protein 8.4 (*)    AST 43 (*)    GFR, Estimated 56 (*)    All other components within normal limits  RESP PANEL BY RT-PCR (FLU A&B, COVID) ARPGX2  URINALYSIS, ROUTINE W REFLEX MICROSCOPIC    EKG EKG Interpretation  Date/Time:  Saturday July 08 2021 13:51:49 EDT Ventricular Rate:  72 PR Interval:  191 QRS Duration: 83 QT Interval:  406 QTC Calculation: 445 R Axis:   -54 Text Interpretation: Sinus rhythm Left axis deviation Low voltage, extremity and precordial leads Abnormal R-wave progression, late transition Nonspecific T abnrm, anterolateral leads Confirmed by Madalyn Rob [phone]) on 07/08/2021 3:27:21 PM  Radiology No results found.  Procedures Procedures   Medications Ordered in ED Medications - No data to display  ED Course  I have reviewed the triage vital signs and the nursing notes.  Pertinent labs & imaging results that were available during my care of the patient were reviewed by me and considered in my medical decision making (see chart for details).    MDM Rules/Calculators/A&P                          71 year old lady presented to ER with concern for generalized weakness, increased confusion.  On exam she appears well in no acute distress.   Has history of vascular dementia.  Basic labs stable.  Will check head CT, chest x-ray, also noted some knee pain on exam and will check knee x-ray.  Will consult TOC.  Given her current presentation, suspect most likely changes related to underlying diagnosis of vascular dementia.  CT head negative for acute findings.  Similar burden of global parenchymal volume loss and chronic microvascular change.  X-ray of knee and chest negative.  Radiologist stated most likely atelectasis but developing infection not excluded.  Patient does not have fever, no white count, no cough, no complaint of difficulty in breathing.  Clinically suspect more likely atelectasis.  Discussed case with TOC who also discussed with family.  Patient will be discharged with plan for outpatient home health.  Family interested in pursuing SNF placement but agreeable to pursue from the out patient setting.    Final Clinical Impression(s) / ED Diagnoses Final diagnoses:  Weakness  Vascular dementia without behavioral disturbance [facility])    Rx / DC Orders ED Discharge Orders     None        Lucrezia Starch, MD 07/08/21 1530    Lucrezia Starch, MD 07/08/21 [phone]

## 2021-07-08 NOTE — ED Triage Notes (Signed)
Daughter states pt could walk with a walker yesterday before 6pm and after that she has had to be assisted out of bed to bathroom and chair; daughter states pt has dementia but is more confused than usual; pt c/o of her legs hurt

## 2021-07-08 NOTE — ED Notes (Signed)
Put pt on bedpan not successful ,then put pt on a purewick

## 2021-07-09 NOTE — TOC Initial Note (Signed)
Transition of Care [facility]) - Initial/Assessment Note    Patient Details  Name: Whitney Velez MRN: [id number] Date of Birth: Dec 21, 1950  [facility]) CM/SW Contact:    Whitney Bence, LCSW Phone Number: 07/09/2021, 12:33 PM  Clinical Narrative:                 Patient is 71 year old female admitted for Dementia ([place]). CSW received consult for [facility] vs SNF. CSW conducted intial assessment. Patient's daughter reported that patient was ambulatory with min assistance prior to hospitalization and that with in 24 hours patient required max assist with sitting up and is not able to walk. CSW requested PT eval for patient to RN and MD. CSW received call from MD stating that Medical director did not order PT eval and will place order for [facility]. CSW notified MD that due to patient's insurance, that [facility] may not be able to obtain [facility] for patient today. CSW also notified MD that placement outside of the hospital may take several weeks and that if CSW is not able to find [facility], patient would have to follow up with PCP on weekday to obtain [facility] services. MD reported that he understood patient may not receive [facility] services today. TOC to follow.        Patient Goals and CMS Choice        Expected Discharge Plan and Services                                                Prior Living Arrangements/Services                       Activities of Daily Living      Permission Sought/Granted                  Emotional Assessment              Admission diagnosis:  Weakness (not able to walk) Patient Active Problem List   Diagnosis Date Noted   Dementia ([place]) 04/24/2021   Memory loss 11/20/2020   Confusion 11/20/2020   Osteopenia 05/08/2019   S/P bilateral mastectomy 12/01/2018   Invasive ductal carcinoma of breast, female, right ([place])    Ductal carcinoma in situ (DCIS) of left breast    Goals of care, counseling/discussion 05/29/2018   Breast cancer, right ([place])  05/12/2018   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

## 2021-07-09 NOTE — TOC Transition Note (Signed)
Transition of Care [facility]) - CM/SW Discharge Note   Patient Details  Name: Whitney Velez MRN: [id number] Date of Birth: 1950-06-14  Transition of Care [facility]) CM/SW Contact:  Natasha Bence, LCSW Phone Number: 07/09/2021, 4:38 PM   Clinical Narrative:    CSW contacted [facility], [facility], [facility], and [facility]. [facility] agencies not able to provide services to patient due to insurance time and [facility] needs. CSW notified patient's daughter. TOC signing off.         Patient Goals and CMS Choice        Discharge Placement                       Discharge Plan and Services                                     Social Determinants of Health (SDOH) Interventions     Readmission Risk Interventions No flowsheet data found.

## 2021-07-11 ENCOUNTER — Encounter: Payer: Self-pay | Admitting: Family Medicine

## 2021-07-11 ENCOUNTER — Other Ambulatory Visit: Payer: Self-pay

## 2021-07-11 ENCOUNTER — Ambulatory Visit (INDEPENDENT_AMBULATORY_CARE_PROVIDER_SITE_OTHER): Payer: Medicare Other | Admitting: Family Medicine

## 2021-07-11 VITALS — BP 158/79 | HR 65 | Temp 97.9°F

## 2021-07-11 DIAGNOSIS — E119 Type 2 diabetes mellitus without complications: Secondary | ICD-10-CM | POA: Diagnosis not present

## 2021-07-11 DIAGNOSIS — R296 Repeated falls: Secondary | ICD-10-CM

## 2021-07-11 DIAGNOSIS — F0390 Unspecified dementia without behavioral disturbance: Secondary | ICD-10-CM | POA: Diagnosis not present

## 2021-07-11 DIAGNOSIS — R32 Unspecified urinary incontinence: Secondary | ICD-10-CM | POA: Diagnosis not present

## 2021-07-11 DIAGNOSIS — R531 Weakness: Secondary | ICD-10-CM | POA: Diagnosis not present

## 2021-07-11 NOTE — Progress Notes (Signed)
Patient ID: Whitney Velez, female    DOB: 04-30-1950, 71 y.o.   MRN: [id number]   Chief Complaint  Patient presents with   Weakness   Subjective:    HPI Pt brought in my daughter and son. Pt here for weakness. Began Friday. Daughter states that Friday pt became unable to walk. Pt was going to adult day care but since non ambulatory at this time not allowed to go back. Pt went to [facility] ED on Saturday.   Feeling 2 weeks in a row and felt nausea recently.  Eating and not skipping meals.   Per son and daughter, pt not having strength in lower legs, and falling more frequently.   Went to ER for evaluation- not able to admit pt and felt that she could get PT as an outpatient.  Wt Readings from Last 3 Encounters:  07/08/21 165 lb (74.8 kg)  04/26/21 160 lb (72.6 kg)  03/07/21 164 lb 6.4 oz (74.6 kg)   Weakness and not able to stand for 1 minute and needing to sit.  Started 2 wks ago and shuffling feet more.  Went to daycare center and needing walker due to not being steady on feet.  Not able to stand and needing help to get to bed.  Went to ER. Increased generalized weakness.  Has had rolling walker/seated.  The daycare requesting for PT evaluation to get her to go to daycare.  Still seeing Dr. Merlene Velez.   Waking up at night and going to bathroom and has some falls.  Last 2 wks [place] and hit her head.  During day is able to sit down and follow commands.  Last 3-4 days hit her head and not sure how it happened.   For anxiety- Taking Clonazepam 1/2 tab in am and 1 tab at night. And up to 4x per day on clonazepam per neurologist.  Zoloft taking it now.  Incontinence- Pt has accidents and wearing pull ups.  Not wanting to go to bathroom and not walking much. Then wanting to get up and walking.  Hasn't had a bedside commode.   Not able to walk up steps now.  Hard to get in daughters house.  Has a seated rolling walker.  Medical History Whitney Velez has a past medical  history of Asthma and Hypertension.   Outpatient Encounter Medications as of 07/11/2021  Medication Sig   albuterol (VENTOLIN HFA) 108 (90 Base) MCG/ACT inhaler INHALE TWO PUFFS INTO THE LUNGS EVERY SIX HOURS AS NEEDED FOR WHEEZING   amLODipine (NORVASC) 5 MG tablet TAKE ONE (1) TABLET BY MOUTH EVERY DAY   anastrozole (ARIMIDEX) 1 MG tablet TAKE ONE (1) TABLET BY MOUTH EVERY DAY   anastrozole (ARIMIDEX) 1 MG tablet TAKE ONE (1) TABLET BY MOUTH EVERY DAY   anastrozole (ARIMIDEX) 1 MG tablet TAKE ONE (1) TABLET BY MOUTH EVERY DAY   aspirin 81 MG tablet Take 81 mg by mouth daily.   Calcium Carb-Cholecalciferol (CALCIUM + VITAMIN D3 PO) Take 1 tablet by mouth 2 (two) times daily.   clonazePAM (KLONOPIN) 0.5 MG tablet Take 1/2 tab p.o. in am and then 1 tab p.o. qhs. prn FOR ANXIETY (MAX 2 TABLETS IN 24 HOURS)   denosumab (PROLIA) 60 MG/ML SOSY injection Inject 60 mg into the skin every 6 (six) months.   Dextromethorphan-quiNIDine (NUEDEXTA) 20-10 MG capsule Nuedexta 20 mg-10 mg capsule  Take 1 capsule every 12 hours by oral route.  1 daily for 1 month and then increase  to 1 twice a day   FLOVENT HFA 44 MCG/ACT inhaler INHALE TWO PUFFS INTO THE LUNGS IN THE MORNING AND AT BEDTIME TO PREVENT WHEEZING   ibuprofen (ADVIL) 200 MG tablet Take 200 mg by mouth at bedtime as needed.   montelukast (SINGULAIR) 10 MG tablet TAKE ONE TABLET (10MG  TOTAL) BY MOUTH ATBEDTIME   OVER THE COUNTER MEDICATION Stool softner once daily   potassium chloride SA (KLOR-CON) 20 MEQ tablet TAKE ONE TABLET (20MEQ TOTAL) BY MOUTH TWO TIMES DAILY   [DISCONTINUED] hydrochlorothiazide (HYDRODIURIL) 25 MG tablet Take 1 tablet (25 mg total) by mouth daily.   sertraline (ZOLOFT) 100 MG tablet Take 150 mg by mouth daily.   [DISCONTINUED] donepezil (ARICEPT) 5 MG tablet donepezil 5 mg tablet   No facility-administered encounter medications on file as of 07/11/2021.     Review of Systems  Constitutional:  Negative for chills and  fever.  HENT:  Negative for congestion, rhinorrhea and sore throat.   Respiratory:  Negative for cough, shortness of breath and wheezing.   Cardiovascular:  Negative for chest pain and leg swelling.  Gastrointestinal:  Negative for abdominal pain, diarrhea, nausea and vomiting.  Genitourinary:  Negative for dysuria and frequency.  Musculoskeletal:  Negative for arthralgias and back pain.  Skin:  Negative for rash.  Neurological:  Positive for weakness (lower legs, falls). Negative for dizziness and headaches.    Vitals BP (!) 158/79   Pulse 65   Temp 97.9 F (36.6 C)   SpO2 96%   Objective:   Physical Exam Vitals and nursing note reviewed.  Constitutional:      General: She is not in acute distress.    Appearance: Normal appearance. She is not ill-appearing.  HENT:     Head: Normocephalic and atraumatic.     Nose: Nose normal.     Mouth/Throat:     Mouth: Mucous membranes are moist.     Pharynx: Oropharynx is clear.  Eyes:     Extraocular Movements: Extraocular movements intact.     Conjunctiva/sclera: Conjunctivae normal.     Pupils: Pupils are equal, round, and reactive to light.  Cardiovascular:     Rate and Rhythm: Normal rate and regular rhythm.     Pulses: Normal pulses.     Heart sounds: Normal heart sounds.  Pulmonary:     Effort: Pulmonary effort is normal.     Breath sounds: Normal breath sounds. No wheezing, rhonchi or rales.  Musculoskeletal:        General: Normal range of motion.     Right lower leg: No edema.     Left lower leg: No edema.  Skin:    General: Skin is warm and dry.     Findings: No lesion or rash.  Neurological:     General: No focal deficit present.     Mental Status: She is alert. Mental status is at baseline.     Cranial Nerves: No cranial nerve deficit.     Comments: +sitting on seated rolling walker   Psychiatric:        Mood and Affect: Mood normal.        Behavior: Behavior normal.     Assessment and Plan   1.  Generalized weakness - Ambulatory referral to Social Work - Ambulatory referral to [facility]  2. Dementia without behavioral disturbance, unspecified dementia type ([place]) - Ambulatory referral to Social Work - Ambulatory referral to Home Health  3. Urinary incontinence, unspecified type - Ambulatory referral to Home  Health  4. Falls frequently - Ambulatory referral to Home Health   Will order social worker and home health to help with pt care and to see if can get advice on placement into a facility.   Pt having a more rapid decline in her status over the last 2-3 months with memory and with strength.  Return in about 2 months (around 09/11/2021) for f/u dementia.

## 2021-07-12 DIAGNOSIS — R296 Repeated falls: Secondary | ICD-10-CM | POA: Diagnosis not present

## 2021-07-17 ENCOUNTER — Other Ambulatory Visit: Payer: Self-pay | Admitting: Family Medicine

## 2021-07-17 NOTE — Telephone Encounter (Signed)
Lab Results  Component Value Date   HGBA1C 5.5 03/28/2020    Lab Results  Component Value Date   CREATININE 1.05 (H) 07/08/2021     Lab Results  Component Value Date   CHOL 211 (H) 03/28/2020   HDL 65 03/28/2020   LDLCALC 132 (H) 03/28/2020   TRIG 81 03/28/2020   CHOLHDL 3.2 03/28/2020     BP Readings from Last 3 Encounters:  07/11/21 (!) 158/79  07/08/21 114/64  04/26/21 (!) 143/76

## 2021-07-18 DIAGNOSIS — G309 Alzheimer's disease, unspecified: Secondary | ICD-10-CM | POA: Diagnosis not present

## 2021-07-18 DIAGNOSIS — I951 Orthostatic hypotension: Secondary | ICD-10-CM | POA: Diagnosis not present

## 2021-07-18 DIAGNOSIS — Z79899 Other long term (current) drug therapy: Secondary | ICD-10-CM | POA: Diagnosis not present

## 2021-07-18 DIAGNOSIS — R296 Repeated falls: Secondary | ICD-10-CM | POA: Diagnosis not present

## 2021-07-18 DIAGNOSIS — R2689 Other abnormalities of gait and mobility: Secondary | ICD-10-CM | POA: Diagnosis not present

## 2021-07-18 DIAGNOSIS — I679 Cerebrovascular disease, unspecified: Secondary | ICD-10-CM | POA: Diagnosis not present

## 2021-07-18 DIAGNOSIS — F482 Pseudobulbar affect: Secondary | ICD-10-CM | POA: Diagnosis not present

## 2021-07-21 ENCOUNTER — Other Ambulatory Visit: Payer: Self-pay

## 2021-07-21 ENCOUNTER — Encounter (HOSPITAL_COMMUNITY): Payer: Self-pay

## 2021-07-21 ENCOUNTER — Ambulatory Visit (HOSPITAL_COMMUNITY): Payer: Medicare Other | Attending: Neurology

## 2021-07-21 DIAGNOSIS — R262 Difficulty in walking, not elsewhere classified: Secondary | ICD-10-CM | POA: Diagnosis not present

## 2021-07-21 DIAGNOSIS — R296 Repeated falls: Secondary | ICD-10-CM | POA: Diagnosis not present

## 2021-07-21 DIAGNOSIS — R2681 Unsteadiness on feet: Secondary | ICD-10-CM | POA: Diagnosis not present

## 2021-07-21 NOTE — Therapy (Signed)
[facility] [address] [place], [place], [address] Phone: [phone]   Fax:  [phone]  Physical Therapy Evaluation  Patient Details  Name: Whitney Velez MRN: [id number] Date of Birth: 12-06-1950 Referring Provider (PT): Phillips Odor, MD   Encounter Date: 07/21/2021   PT End of Session - 07/21/21 1253     Visit Number 1    Number of Visits 8    Date for PT Re-Evaluation 08/18/21    Authorization Type UHC Medicare, no auth, no VL    PT Start Time 1300    PT Stop Time 1345    PT Time Calculation (min) 45 min    Equipment Utilized During Treatment Gait belt    Activity Tolerance Patient tolerated treatment well;Patient limited by fatigue    Behavior During Therapy [facility] for tasks assessed/performed             Past Medical History:  Diagnosis Date   Asthma    Hypertension     Past Surgical History:  Procedure Laterality Date   ABDOMINAL HYSTERECTOMY     MASTECTOMY MODIFIED RADICAL Right 12/01/2018   Procedure: RIGHT MODIFIED RADICAL MASTECTOMY (Procedure #1);  Surgeon: Aviva Signs, MD;  Location: [facility];  Service: General;  Laterality: Right;   PORTACATH PLACEMENT Left 06/04/2018   Procedure: INSERTION PORT-A-CATH;  Surgeon: Aviva Signs, MD;  Location: [facility];  Service: General;  Laterality: Left;   SIMPLE MASTECTOMY WITH AXILLARY SENTINEL NODE BIOPSY Left 12/01/2018   Procedure: LEFT SIMPLE MASTECTOMY (Procedure #2);  Surgeon: Aviva Signs, MD;  Location: [facility];  Service: General;  Laterality: Left;    There were no vitals filed for this visit.    Subjective Assessment - 07/21/21 1256     Subjective Patient with dementia and daughter endorses multiple falling episodes where she gets up out of bed on her own and becomes weak and falls once attempts to get going. Patient's daughter endorses instances of orthostatic hypotension which is being addressed by MD via medication reconciliation    Currently in Pain?  No/denies    Pain Score 0-No pain                [facility] PT Assessment - 07/21/21 0001       Assessment   Medical Diagnosis Repeated falls    Referring Provider (PT) Phillips Odor, MD      Precautions   Precautions Fall      Balance Screen   Has the patient fallen in the past 6 months Yes    How many times? over ten    Has the patient had a decrease in activity level because of a fear of falling?  Yes    Is the patient reluctant to leave their home because of a fear of falling?  Yes      [place] Private residence    Living Arrangements Children    Available Help at Discharge Family    Type of [place] to enter    Entrance Stairs-Number of Steps 6    Entrance Stairs-Rails Can reach both    [place] One level    [place] - 4 wheels;Bedside commode      Prior Function   Level of Independence Needs assistance with ADLs;Needs assistance with homemaking;Needs assistance with gait;Needs assistance with transfers    [place] Retired    [employer]      [employer]  Overall Cognitive Status History of cognitive impairments - at baseline    Memory Impaired    Memory Impairment Retrieval deficit;Decreased recall of new information;Decreased short term memory    Awareness Impaired    Problem Solving Impaired      Coordination   Gross Motor Movements are Fluid and Coordinated Yes    Fine Motor Movements are Fluid and Coordinated No      Posture/Postural Control   Posture/Postural Control No significant limitations      ROM / Strength   AROM / PROM / Strength Strength      Strength   Overall Strength Comments 4-/5 gross BLE strength      Transfers   Transfers Sit to Stand;Stand Pivot Transfers    Sit to Stand 4: Min guard    Five time sit to stand comments  15 sec    Stand Pivot Transfers 4: Min guard      Ambulation/Gait   Ambulation/Gait Yes    Ambulation/Gait Assistance 4: Min guard     Ambulation Distance (Feet) 150 Feet    Assistive device 4-wheeled walker    Gait Pattern Shuffle    Ambulation Surface Level;Indoor    Gait velocity decreased    Stairs Yes    Stairs Assistance 4: Min guard    Stair Management Technique Two rails;Alternating pattern    Number of Stairs 8    Height of Stairs 6    Gait Comments 2MWT      Standardized Balance Assessment   Standardized Balance Assessment Timed Up and Go Test      Timed Up and Go Test   Normal TUG (seconds) 33                        Objective measurements completed on examination: See above findings.               PT Education - 07/21/21 1329     Education Details education on assessment findings and tx rationale    Person(s) Educated Patient;Child(ren)    Methods Explanation    Comprehension Verbalized understanding              PT Short Term Goals - 07/21/21 1349       PT SHORT TERM GOAL #1   Title Demo decreased risk for falls as evidenced by 12 sec 5xSTS    Baseline 15 sec    Time 2    Period Weeks    Status New    Target Date 08/04/21      PT SHORT TERM GOAL #2   Title Decrease risk for falls as evidenced by time of 19 sec TUG test with 4WW    Baseline 33 sec    Time 2    Period Weeks    Status New    Target Date 08/04/21      PT SHORT TERM GOAL #3   Title Demo improved gait velocity as evidenced by distance of 200 ft during 2MWT    Baseline 150 ft with 4WW and CGA due to unsteadiness    Time 2    Period Weeks    Status New    Target Date 08/04/21               PT Long Term Goals - 07/21/21 1351       PT LONG TERM GOAL #1   Title Demo independent transfers for safe maneuvering in small spaces    Baseline CGA  with poor safety awareness    Time 4    Period Weeks    Status New    Target Date 08/18/21      PT LONG TERM GOAL #2   Title Demo modified independent ambulation level surfaces    Baseline CGA x 150 ft w/ [id number]    Time 4    Period Weeks     Status New    Target Date 08/18/21      PT LONG TERM GOAL #3   Title Demo low risk for falls as evidenced by time of 12 sec TUG test    Baseline 33 sec with 4WW and CGA    Time 4    Period Weeks    Status New    Target Date 08/18/21      PT LONG TERM GOAL #4   Title Ambulate x 8 stairs with BHR and supervision to facilitate safety with home entry    Baseline CGA x 8 stairs with BHR    Time 4    Period Weeks    Status New    Target Date 08/18/21                    Plan - 07/21/21 1341     Clinical Impression Statement Pt is 71 yo lady with hx of multiple falls and cognitive/memory deficits at baseline. Pt exhibits generalized BLE weakness, balance/coordination deficits, high risk for falls, decreased safety awareness, increased need for caregiver assistance, reduced ability to safely ambulate. Deficits and limitations indicating need for PT intervention to improve strength/activity tolerance, caregiver education/training in safe mobility/HEP practices, minimize risk for falls, training in adaptations/compensations to improve carryover and safe mobility practices.    Personal Factors and Comorbidities Comorbidity 3+;Time since onset of injury/illness/exacerbation    Comorbidities dementia, DM, HTN    Examination-Activity Limitations Bend;Carry;Lift;Toileting;Stand;Stairs;Locomotion Level;Transfers    Examination-Participation Restrictions Meal Prep;Community Activity    Stability/Clinical Decision Making Evolving/Moderate complexity    Clinical Decision Making Moderate    Rehab Potential Good    PT Frequency 2x / week    PT Duration 4 weeks    PT Treatment/Interventions ADLs/Self Care Home Management;DME Instruction;Gait training;Stair training;Functional mobility training;Therapeutic activities;Therapeutic exercise;Balance training;Neuromuscular re-education;Patient/family education;Manual techniques;Wheelchair mobility training    PT Next Visit Plan General  strengthening for HEP development, emphasis on caregiver education/instruction    PT Home Exercise Plan 5xSTS with caregiver supervision    Consulted and Agree with Plan of Care Patient             Patient will benefit from skilled therapeutic intervention in order to improve the following deficits and impairments:  Abnormal gait, Decreased activity tolerance, Decreased balance, Decreased cognition, Decreased mobility, Decreased endurance, Decreased coordination, Decreased strength, Difficulty walking, Improper body mechanics  Visit Diagnosis: Falls frequently  Difficulty in walking, not elsewhere classified  Unsteadiness on feet     Problem List Patient Active Problem List   Diagnosis Date Noted   Urinary incontinence 07/11/2021   Generalized weakness 07/11/2021   Falls frequently 07/11/2021   Dementia ([place]) 04/24/2021   Memory loss 11/20/2020   Confusion 11/20/2020   Osteopenia 05/08/2019   S/P bilateral mastectomy 12/01/2018   Invasive ductal carcinoma of breast, female, right ([place])    Ductal carcinoma in situ (DCIS) of left breast    Goals of care, counseling/discussion 05/29/2018   Breast cancer, right ([place]) 05/12/2018   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 Sherlyn Lees, PT, DPT Physical Therapist- [place] Office Number: [phone]   [facility] [address] [place], [place], [address] Phone: [phone]   Fax:  [phone]  Name: SHALIE TREDWAY MRN: [id number] Date of Birth: January 11, 1950

## 2021-07-24 ENCOUNTER — Other Ambulatory Visit: Payer: Self-pay

## 2021-07-24 ENCOUNTER — Encounter (HOSPITAL_COMMUNITY): Payer: Self-pay

## 2021-07-24 ENCOUNTER — Ambulatory Visit (HOSPITAL_COMMUNITY): Payer: Medicare Other

## 2021-07-24 ENCOUNTER — Telehealth: Payer: Self-pay | Admitting: *Deleted

## 2021-07-24 DIAGNOSIS — R2681 Unsteadiness on feet: Secondary | ICD-10-CM | POA: Diagnosis not present

## 2021-07-24 DIAGNOSIS — R262 Difficulty in walking, not elsewhere classified: Secondary | ICD-10-CM

## 2021-07-24 DIAGNOSIS — R296 Repeated falls: Secondary | ICD-10-CM

## 2021-07-24 NOTE — Therapy (Signed)
[facility] [address] [place], [place], [address] Phone: [phone]   Fax:  [phone]  Physical Therapy Treatment  Patient Details  Name: Whitney Velez MRN: [id number] Date of Birth: [birth year] Referring Provider (PT): Phillips Odor, MD   Encounter Date: 07/24/2021   PT End of Session - 07/24/21 1524     Visit Number 2    Number of Visits 8    Date for PT Re-Evaluation 08/18/21    Authorization Type UHC Medicare, no auth, no VL    PT Start Time 1515    PT Stop Time 1600    PT Time Calculation (min) 45 min    Equipment Utilized During Treatment Gait belt    Activity Tolerance Patient tolerated treatment well;Patient limited by fatigue    Behavior During Therapy [facility] for tasks assessed/performed             Past Medical History:  Diagnosis Date   Asthma    Hypertension     Past Surgical History:  Procedure Laterality Date   ABDOMINAL HYSTERECTOMY     MASTECTOMY MODIFIED RADICAL Right 12/01/2018   Procedure: RIGHT MODIFIED RADICAL MASTECTOMY (Procedure #1);  Surgeon: Aviva Signs, MD;  Location: [facility];  Service: General;  Laterality: Right;   PORTACATH PLACEMENT Left 06/04/2018   Procedure: INSERTION PORT-A-CATH;  Surgeon: Aviva Signs, MD;  Location: [facility];  Service: General;  Laterality: Left;   SIMPLE MASTECTOMY WITH AXILLARY SENTINEL NODE BIOPSY Left 12/01/2018   Procedure: LEFT SIMPLE MASTECTOMY (Procedure #2);  Surgeon: Aviva Signs, MD;  Location: [facility];  Service: General;  Laterality: Left;    There were no vitals filed for this visit.   Subjective Assessment - 07/24/21 1524     Subjective No new issues reported. Daughter reports no falls since last session.    Currently in Pain? No/denies    Pain Score 0-No pain                [facility] PT Assessment - 07/24/21 0001       Assessment   Medical Diagnosis Repeated falls    Referring Provider (PT) Phillips Odor, MD                            [facility] Adult PT Treatment/Exercise - 07/24/21 0001       Ambulation/Gait   Ambulation/Gait Yes    Ambulation/Gait Assistance 4: Min guard    Ambulation Distance (Feet) 200 Feet    Assistive device 4-wheeled walker    Gait Pattern Shuffle;Trunk flexed    Ambulation Surface Level;Indoor    Gait Comments excessive trunk flexion with onset of fatigue and difficulty maintaining proximity of AD      Exercises   Exercises Knee/Hip      Knee/Hip Exercises: Aerobic   Nustep seat level 4, arm level 8 x 5 min for dynamic warm-up and improve coordination      Knee/Hip Exercises: Standing   Hip Flexion Stengthening;Both    Hip Flexion Limitations 2 min 6" stair taps 3 lbs weights    Forward Step Up Both;Step Height: 6";3 sets;10 reps    SLS offset standing: LE elevated on 6" step, static stance 3x15 sec left/right    Other Standing Knee Exercises sidestepping x 2 min 3 lbs      Knee/Hip Exercises: Seated   Long Arc Quad Strengthening;Both;3 sets;10 reps    Long Arc Quad Weight 3 lbs.  Ball Squeeze 3x10                      PT Short Term Goals - 07/21/21 1349       PT SHORT TERM GOAL #1   Title Demo decreased risk for falls as evidenced by 12 sec 5xSTS    Baseline 15 sec    Time 2    Period Weeks    Status New    Target Date 08/04/21      PT SHORT TERM GOAL #2   Title Decrease risk for falls as evidenced by time of 19 sec TUG test with 4WW    Baseline 33 sec    Time 2    Period Weeks    Status New    Target Date 08/04/21      PT SHORT TERM GOAL #3   Title Demo improved gait velocity as evidenced by distance of 200 ft during 2MWT    Baseline 150 ft with 4WW and CGA due to unsteadiness    Time 2    Period Weeks    Status New    Target Date 08/04/21               PT Long Term Goals - 07/21/21 1351       PT LONG TERM GOAL #1   Title Demo independent transfers for safe maneuvering in small spaces    Baseline CGA with  poor safety awareness    Time 4    Period Weeks    Status New    Target Date 08/18/21      PT LONG TERM GOAL #2   Title Demo modified independent ambulation level surfaces    Baseline CGA x 150 ft w/ [id number]    Time 4    Period Weeks    Status New    Target Date 08/18/21      PT LONG TERM GOAL #3   Title Demo low risk for falls as evidenced by time of 12 sec TUG test    Baseline 33 sec with 4WW and CGA    Time 4    Period Weeks    Status New    Target Date 08/18/21      PT LONG TERM GOAL #4   Title Ambulate x 8 stairs with BHR and supervision to facilitate safety with home entry    Baseline CGA x 8 stairs with BHR    Time 4    Period Weeks    Status New    Target Date 08/18/21                   Plan - 07/24/21 1605     Clinical Impression Statement Requires consistent verbal/visual cues for demonstration of activities due to cognitive/attention status. Onset of fatigue at end of session and dmeonstrating increased difficulty with maintaining safety with walking. Continued sessions indicated to progress gait/balance. Asked her daughter to seek out a typical RW to improve safety/stability with gait    Personal Factors and Comorbidities Comorbidity 3+;Time since onset of injury/illness/exacerbation    Comorbidities dementia, DM, HTN    Examination-Activity Limitations Bend;Carry;Lift;Toileting;Stand;Stairs;Locomotion Level;Transfers    Examination-Participation Restrictions Meal Prep;Community Activity    Stability/Clinical Decision Making Evolving/Moderate complexity    Rehab Potential Good    PT Frequency 2x / week    PT Duration 4 weeks    PT Treatment/Interventions ADLs/Self Care Home Management;DME Instruction;Gait training;Stair training;Functional mobility training;Therapeutic activities;Therapeutic exercise;Balance training;Neuromuscular re-education;Patient/family education;Manual techniques;Wheelchair  mobility training    PT Next Visit Plan General  strengthening for HEP development, emphasis on caregiver education/instruction    PT Home Exercise Plan 5xSTS with caregiver supervision    Consulted and Agree with Plan of Care Patient             Patient will benefit from skilled therapeutic intervention in order to improve the following deficits and impairments:  Abnormal gait, Decreased activity tolerance, Decreased balance, Decreased cognition, Decreased mobility, Decreased endurance, Decreased coordination, Decreased strength, Difficulty walking, Improper body mechanics  Visit Diagnosis: Falls frequently  Difficulty in walking, not elsewhere classified  Unsteadiness on feet     Problem List Patient Active Problem List   Diagnosis Date Noted   Urinary incontinence 07/11/2021   Generalized weakness 07/11/2021   Falls frequently 07/11/2021   Dementia ([place]) 04/24/2021   Memory loss 11/20/2020   Confusion 11/20/2020   Osteopenia 05/08/2019   S/P bilateral mastectomy 12/01/2018   Invasive ductal carcinoma of breast, female, right ([place])    Ductal carcinoma in situ (DCIS) of left breast    Goals of care, counseling/discussion 05/29/2018   Breast cancer, right ([place]) 05/12/2018   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 Sherlyn Lees, PT, DPT Physical Therapist- [place] Office Number: [phone]   [facility] [address] [place], [place], [address] Phone: [phone]   Fax:  [phone]  Name: ANZLEIGH AMAYA MRN: [id number] Date of Birth: 1950/01/25

## 2021-07-24 NOTE — Chronic Care Management (AMB) (Signed)
  Chronic Care Management   Outreach Note  07/24/2021 Name: Whitney Velez MRN: [id number] DOB: Dec 25, 1950  Whitney Velez is a 71 y.o. year old female who is a primary care patient of Erven Colla, DO. I reached out to Whitney Velez by phone today in response to a referral sent by Ms. Enedelia K Fouch's PCP, Elvia Collum M, DO      An unsuccessful telephone outreach was attempted today. The patient was referred to the case management team for assistance with care management and care coordination.   Follow Up Plan: A HIPAA compliant phone message was left for the patient providing contact information and requesting a return call. The care management team will reach out to the patient again over the next 7 days.  If patient returns call to provider office, please advise to call [place] at [phone].  [place] Management  Direct Dial: [phone]

## 2021-07-24 NOTE — Addendum Note (Signed)
Addended by: Erven Colla on: 07/24/2021 09:43 AM   Modules accepted: Orders

## 2021-07-25 ENCOUNTER — Ambulatory Visit (HOSPITAL_COMMUNITY): Payer: Medicare Other

## 2021-07-25 DIAGNOSIS — R262 Difficulty in walking, not elsewhere classified: Secondary | ICD-10-CM | POA: Diagnosis not present

## 2021-07-25 DIAGNOSIS — R2681 Unsteadiness on feet: Secondary | ICD-10-CM

## 2021-07-25 DIAGNOSIS — R296 Repeated falls: Secondary | ICD-10-CM | POA: Diagnosis not present

## 2021-07-25 NOTE — Chronic Care Management (AMB) (Signed)
  Chronic Care Management   Note  07/25/2021 Name: Whitney Velez MRN: [id number] DOB: 1950/03/20  Whitney Velez is a 71 y.o. year old female who is a primary care patient of Erven Colla, DO. I reached out to Whitney Velez by phone today in response to a referral sent by Whitney Velez's PCP, Dr. Lovena Le.      Whitney Velez was given information about Chronic Care Management services today including:  CCM service includes personalized support from designated clinical staff supervised by her physician, including individualized plan of care and coordination with other care providers 24/7 contact phone numbers for assistance for urgent and routine care needs. Service will only be billed when office clinical staff spend 20 minutes or more in a month to coordinate care. Only one practitioner may furnish and bill the service in a calendar month. The patient may stop CCM services at any time (effective at the end of the month) by phone call to the office staff. The patient will be responsible for cost sharing (co-pay) of up to 20% of the service fee (after annual deductible is met).  Daughter Whitney Velez DPR on file   verbally agreed to assistance and services provided by embedded care coordination/care management team today.  Follow up plan: Telephone appointment with care management team member scheduled for:08/09/2021  [place] Management  Direct Dial: [phone]

## 2021-07-25 NOTE — Therapy (Signed)
[facility] [address] [place], [place], [address] Phone: [phone]   Fax:  [phone]  Physical Therapy Treatment  Patient Details  Name: Whitney Velez MRN: [id number] Date of Birth: [birth year] Referring Provider (PT): Phillips Odor, MD   Encounter Date: 07/25/2021   PT End of Session - 07/25/21 1447     Visit Number 3    Number of Visits 8    Date for PT Re-Evaluation 08/18/21    Authorization Type UHC Medicare, no auth, no VL    PT Start Time 1430    PT Stop Time 1515    PT Time Calculation (min) 45 min    Equipment Utilized During Treatment Gait belt    Activity Tolerance Patient tolerated treatment well;Patient limited by fatigue    Behavior During Therapy [facility] for tasks assessed/performed             Past Medical History:  Diagnosis Date   Asthma    Hypertension     Past Surgical History:  Procedure Laterality Date   ABDOMINAL HYSTERECTOMY     MASTECTOMY MODIFIED RADICAL Right 12/01/2018   Procedure: RIGHT MODIFIED RADICAL MASTECTOMY (Procedure #1);  Surgeon: Aviva Signs, MD;  Location: [facility];  Service: General;  Laterality: Right;   PORTACATH PLACEMENT Left 06/04/2018   Procedure: INSERTION PORT-A-CATH;  Surgeon: Aviva Signs, MD;  Location: [facility];  Service: General;  Laterality: Left;   SIMPLE MASTECTOMY WITH AXILLARY SENTINEL NODE BIOPSY Left 12/01/2018   Procedure: LEFT SIMPLE MASTECTOMY (Procedure #2);  Surgeon: Aviva Signs, MD;  Location: [facility];  Service: General;  Laterality: Left;    There were no vitals filed for this visit.   Subjective Assessment - 07/25/21 1446     Subjective Pt arrives today with new RW for ambulation vs rollator. Daughter reports improved posture with RW vs rollator which promoted excessive forward flexion when walking, especially with onset of fatigue.                [facility] PT Assessment - 07/25/21 0001       Assessment   Medical Diagnosis Repeated falls     Referring Provider (PT) Phillips Odor, MD                           [facility] Adult PT Treatment/Exercise - 07/25/21 0001       Knee/Hip Exercises: Aerobic   Recumbent Bike level 1 x 5 min for dynamic warm-up and for benefit of rapid alternating movement      Knee/Hip Exercises: Standing   Hip Flexion Stengthening;Both    Hip Flexion Limitations 2 min 6" stair taps 3 lbs weights    Other Standing Knee Exercises sidestepping x 2 min 3 lbs around mat table to improve lateral maneuvering and BUE WBing    Other Standing Knee Exercises standing at mat table and placing colored pegs in line to improve fine motor coordination and attention to task and to improve standing tolerance performed in 3-5 min increments.      Knee/Hip Exercises: Seated   Long Arc Quad Strengthening;Both;3 sets;10 reps    Long Arc Quad Weight 3 lbs.    Ball Squeeze 3x10    Marching Strengthening;Both;3 sets;10 reps    [employer] 3 lbs.                      PT Short Term Goals - 07/21/21 1349  PT SHORT TERM GOAL #1   Title Demo decreased risk for falls as evidenced by 12 sec 5xSTS    Baseline 15 sec    Time 2    Period Weeks    Status New    Target Date 08/04/21      PT SHORT TERM GOAL #2   Title Decrease risk for falls as evidenced by time of 19 sec TUG test with 4WW    Baseline 33 sec    Time 2    Period Weeks    Status New    Target Date 08/04/21      PT SHORT TERM GOAL #3   Title Demo improved gait velocity as evidenced by distance of 200 ft during 2MWT    Baseline 150 ft with 4WW and CGA due to unsteadiness    Time 2    Period Weeks    Status New    Target Date 08/04/21               PT Long Term Goals - 07/21/21 1351       PT LONG TERM GOAL #1   Title Demo independent transfers for safe maneuvering in small spaces    Baseline CGA with poor safety awareness    Time 4    Period Weeks    Status New    Target Date 08/18/21      PT LONG TERM  GOAL #2   Title Demo modified independent ambulation level surfaces    Baseline CGA x 150 ft w/ [id number]    Time 4    Period Weeks    Status New    Target Date 08/18/21      PT LONG TERM GOAL #3   Title Demo low risk for falls as evidenced by time of 12 sec TUG test    Baseline 33 sec with 4WW and CGA    Time 4    Period Weeks    Status New    Target Date 08/18/21      PT LONG TERM GOAL #4   Title Ambulate x 8 stairs with BHR and supervision to facilitate safety with home entry    Baseline CGA x 8 stairs with BHR    Time 4    Period Weeks    Status New    Target Date 08/18/21                   Plan - 07/25/21 1508     Clinical Impression Statement Exhibits difficulty with task sequencing and memory for task detail requiring 80-90% cues for sequence during fine motor tasks. Difficulty with rapid alternating movements with BLE and difficulty with foot clearance over edge of step due to coordination and strength deficits. Consistent safety awareness cues needed for use of AD and obstacle mgmt. Continued sessions indicated to improve strength, coordination, and dynamic balance to reduce risk for falls during mobility.    Personal Factors and Comorbidities Comorbidity 3+;Time since onset of injury/illness/exacerbation    Comorbidities dementia, DM, HTN    Examination-Activity Limitations Bend;Carry;Lift;Toileting;Stand;Stairs;Locomotion Level;Transfers    Examination-Participation Restrictions Meal Prep;Community Activity    Stability/Clinical Decision Making Evolving/Moderate complexity    Rehab Potential Good    PT Frequency 2x / week    PT Duration 4 weeks    PT Treatment/Interventions ADLs/Self Care Home Management;DME Instruction;Gait training;Stair training;Functional mobility training;Therapeutic activities;Therapeutic exercise;Balance training;Neuromuscular re-education;Patient/family education;Manual techniques;Wheelchair mobility training    PT Next Visit Plan General  strengthening for HEP development,  emphasis on caregiver education/instruction    PT Home Exercise Plan 5xSTS with caregiver supervision    Consulted and Agree with Plan of Care Patient             Patient will benefit from skilled therapeutic intervention in order to improve the following deficits and impairments:  Abnormal gait, Decreased activity tolerance, Decreased balance, Decreased cognition, Decreased mobility, Decreased endurance, Decreased coordination, Decreased strength, Difficulty walking, Improper body mechanics  Visit Diagnosis: Falls frequently  Difficulty in walking, not elsewhere classified  Unsteadiness on feet     Problem List Patient Active Problem List   Diagnosis Date Noted   Urinary incontinence 07/11/2021   Generalized weakness 07/11/2021   Falls frequently 07/11/2021   Dementia ([place]) 04/24/2021   Memory loss 11/20/2020   Confusion 11/20/2020   Osteopenia 05/08/2019   S/P bilateral mastectomy 12/01/2018   Invasive ductal carcinoma of breast, female, right ([place])    Ductal carcinoma in situ (DCIS) of left breast    Goals of care, counseling/discussion 05/29/2018   Breast cancer, right ([place]) 05/12/2018   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

## 2021-07-26 ENCOUNTER — Ambulatory Visit: Payer: Medicare Other | Admitting: Family Medicine

## 2021-07-31 ENCOUNTER — Ambulatory Visit (HOSPITAL_COMMUNITY): Payer: Medicare Other | Attending: Neurology

## 2021-07-31 ENCOUNTER — Other Ambulatory Visit: Payer: Self-pay

## 2021-07-31 DIAGNOSIS — R262 Difficulty in walking, not elsewhere classified: Secondary | ICD-10-CM | POA: Diagnosis not present

## 2021-07-31 DIAGNOSIS — R2681 Unsteadiness on feet: Secondary | ICD-10-CM | POA: Diagnosis not present

## 2021-07-31 DIAGNOSIS — R296 Repeated falls: Secondary | ICD-10-CM | POA: Insufficient documentation

## 2021-07-31 NOTE — Therapy (Signed)
[facility] [address] [place], [place], [address] Phone: [phone]   Fax:  [phone]  Physical Therapy Treatment  Patient Details  Name: Whitney Velez MRN: [id number] Date of Birth: Mar 22, 1950 Referring Provider (PT): Phillips Odor, MD   Encounter Date: 07/31/2021   PT End of Session - 07/31/21 1527     Visit Number 4    Number of Visits 8    Date for PT Re-Evaluation 08/18/21    Authorization Type UHC Medicare, no auth, no VL    PT Start Time 1515    PT Stop Time 1600    PT Time Calculation (min) 45 min    Equipment Utilized During Treatment Gait belt    Activity Tolerance Patient tolerated treatment well;Patient limited by fatigue    Behavior During Therapy [facility] for tasks assessed/performed             Past Medical History:  Diagnosis Date   Asthma    Hypertension     Past Surgical History:  Procedure Laterality Date   ABDOMINAL HYSTERECTOMY     MASTECTOMY MODIFIED RADICAL Right 12/01/2018   Procedure: RIGHT MODIFIED RADICAL MASTECTOMY (Procedure #1);  Surgeon: Aviva Signs, MD;  Location: [facility];  Service: General;  Laterality: Right;   PORTACATH PLACEMENT Left 06/04/2018   Procedure: INSERTION PORT-A-CATH;  Surgeon: Aviva Signs, MD;  Location: [facility];  Service: General;  Laterality: Left;   SIMPLE MASTECTOMY WITH AXILLARY SENTINEL NODE BIOPSY Left 12/01/2018   Procedure: LEFT SIMPLE MASTECTOMY (Procedure #2);  Surgeon: Aviva Signs, MD;  Location: [facility];  Service: General;  Laterality: Left;    There were no vitals filed for this visit.   Subjective Assessment - 07/31/21 1539     Subjective Daughter reports that she has been improving in her mobility with less LOB but requiring more frequent redirection to use of RW for ambulation                [facility] PT Assessment - 07/31/21 0001       Assessment   Medical Diagnosis Repeated falls                           [facility] Adult PT  Treatment/Exercise - 07/31/21 0001       Ambulation/Gait   Ambulation/Gait Yes    Ambulation/Gait Assistance 5: Supervision    Ambulation Distance (Feet) 200 Feet    Assistive device Rolling walker    Gait Pattern Shuffle;Trunk flexed    Ambulation Surface Level;Indoor    Stairs Yes    Stairs Assistance 5: Supervision    Stair Management Technique Two rails;Alternating pattern    Number of Stairs 12    Height of Stairs 6    Gait Comments difficulty with negotiating obstacles. Improved tracking/performance with distant focal point for cues      Neuro Re-ed    Neuro Re-ed Details  dynamic sitting performing ball bounce pass with large green stability ball 2x10 and tennis ball 2x10 and then performed in standing 2x10 with each to improve coordination and facilitate reactive righting response. Standing in // bars and kicking ball x2 min to improve coordination and weight shifting      Knee/Hip Exercises: Aerobic   Recumbent Bike level 2 x 5 min for dynamic warm-up      Knee/Hip Exercises: Standing   Hip Flexion Stengthening;Both    Hip Flexion Limitations 2 min 6" stair taps 5 lbs weights  Other Standing Knee Exercises sidestepping x 2 min 5 lbs around mat table to improve lateral maneuvering and BUE WBing      Knee/Hip Exercises: Seated   Long Arc Quad Strengthening;Both;3 sets;10 reps    Long Arc Quad Weight 5 lbs.                      PT Short Term Goals - 07/21/21 1349       PT SHORT TERM GOAL #1   Title Demo decreased risk for falls as evidenced by 12 sec 5xSTS    Baseline 15 sec    Time 2    Period Weeks    Status New    Target Date 08/04/21      PT SHORT TERM GOAL #2   Title Decrease risk for falls as evidenced by time of 19 sec TUG test with 4WW    Baseline 33 sec    Time 2    Period Weeks    Status New    Target Date 08/04/21      PT SHORT TERM GOAL #3   Title Demo improved gait velocity as evidenced by distance of 200 ft during 2MWT     Baseline 150 ft with 4WW and CGA due to unsteadiness    Time 2    Period Weeks    Status New    Target Date 08/04/21               PT Long Term Goals - 07/21/21 1351       PT LONG TERM GOAL #1   Title Demo independent transfers for safe maneuvering in small spaces    Baseline CGA with poor safety awareness    Time 4    Period Weeks    Status New    Target Date 08/18/21      PT LONG TERM GOAL #2   Title Demo modified independent ambulation level surfaces    Baseline CGA x 150 ft w/ [id number]    Time 4    Period Weeks    Status New    Target Date 08/18/21      PT LONG TERM GOAL #3   Title Demo low risk for falls as evidenced by time of 12 sec TUG test    Baseline 33 sec with 4WW and CGA    Time 4    Period Weeks    Status New    Target Date 08/18/21      PT LONG TERM GOAL #4   Title Ambulate x 8 stairs with BHR and supervision to facilitate safety with home entry    Baseline CGA x 8 stairs with BHR    Time 4    Period Weeks    Status New    Target Date 08/18/21                   Plan - 07/31/21 1600     Clinical Impression Statement Improved gait performance with visual referance point for target. Demonstrating improved dynamic standing balance of Fair+ with ability to weight shift and cross midline without BUE support but with delayed righting response appreciated. Continued sessions indicated to improve strength and dynamic balance to reduce risk for falls    Personal Factors and Comorbidities Comorbidity 3+;Time since onset of injury/illness/exacerbation    Comorbidities dementia, DM, HTN    Examination-Activity Limitations Bend;Carry;Lift;Toileting;Stand;Stairs;Locomotion Level;Transfers    Examination-Participation Restrictions Meal Prep;Community Activity    Stability/Clinical Decision Making Evolving/Moderate complexity  Rehab Potential Good    PT Frequency 2x / week    PT Duration 4 weeks    PT Treatment/Interventions ADLs/Self Care Home  Management;DME Instruction;Gait training;Stair training;Functional mobility training;Therapeutic activities;Therapeutic exercise;Balance training;Neuromuscular re-education;Patient/family education;Manual techniques;Wheelchair mobility training    PT Next Visit Plan General strengthening for HEP development, emphasis on caregiver education/instruction    PT Home Exercise Plan 5xSTS with caregiver supervision    Consulted and Agree with Plan of Care Patient             Patient will benefit from skilled therapeutic intervention in order to improve the following deficits and impairments:  Abnormal gait, Decreased activity tolerance, Decreased balance, Decreased cognition, Decreased mobility, Decreased endurance, Decreased coordination, Decreased strength, Difficulty walking, Improper body mechanics  Visit Diagnosis: Falls frequently  Difficulty in walking, not elsewhere classified  Unsteadiness on feet     Problem List Patient Active Problem List   Diagnosis Date Noted   Urinary incontinence 07/11/2021   Generalized weakness 07/11/2021   Falls frequently 07/11/2021   Dementia ([place]) 04/24/2021   Memory loss 11/20/2020   Confusion 11/20/2020   Osteopenia 05/08/2019   S/P bilateral mastectomy 12/01/2018   Invasive ductal carcinoma of breast, female, right ([place])    Ductal carcinoma in situ (DCIS) of left breast    Goals of care, counseling/discussion 05/29/2018   Breast cancer, right ([place]) 05/12/2018   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 Sherlyn Lees, PT, DPT Physical Therapist- [place] Office Number: [phone]   [facility] [address] [place], [place], [address] Phone: [phone]   Fax:   [phone]  Name: SHARIANN MALINSKI MRN: [id number] Date of Birth: 1950/02/22

## 2021-08-02 ENCOUNTER — Other Ambulatory Visit: Payer: Self-pay

## 2021-08-02 ENCOUNTER — Ambulatory Visit (HOSPITAL_COMMUNITY): Payer: Medicare Other

## 2021-08-02 ENCOUNTER — Encounter (HOSPITAL_COMMUNITY): Payer: Self-pay

## 2021-08-02 DIAGNOSIS — R262 Difficulty in walking, not elsewhere classified: Secondary | ICD-10-CM | POA: Diagnosis not present

## 2021-08-02 DIAGNOSIS — R2681 Unsteadiness on feet: Secondary | ICD-10-CM

## 2021-08-02 DIAGNOSIS — R296 Repeated falls: Secondary | ICD-10-CM | POA: Diagnosis not present

## 2021-08-02 NOTE — Therapy (Signed)
[facility] [address] [place], [place], [address] Phone: [phone]   Fax:  [phone]  Physical Therapy Treatment  Patient Details  Name: Whitney Velez MRN: [id number] Date of Birth: 1950/10/01 Referring Provider (PT): Phillips Odor, MD   Encounter Date: 08/02/2021   PT End of Session - 08/02/21 0825     Visit Number 5    Number of Visits 8    Date for PT Re-Evaluation 08/18/21    Authorization Type UHC Medicare, no auth, no VL    PT Start Time 0815    PT Stop Time 0900    PT Time Calculation (min) 45 min    Equipment Utilized During Treatment Gait belt    Activity Tolerance Patient tolerated treatment well;Patient limited by fatigue    Behavior During Therapy [facility] for tasks assessed/performed             Past Medical History:  Diagnosis Date   Asthma    Hypertension     Past Surgical History:  Procedure Laterality Date   ABDOMINAL HYSTERECTOMY     MASTECTOMY MODIFIED RADICAL Right 12/01/2018   Procedure: RIGHT MODIFIED RADICAL MASTECTOMY (Procedure #1);  Surgeon: Aviva Signs, MD;  Location: [facility];  Service: General;  Laterality: Right;   PORTACATH PLACEMENT Left 06/04/2018   Procedure: INSERTION PORT-A-CATH;  Surgeon: Aviva Signs, MD;  Location: [facility];  Service: General;  Laterality: Left;   SIMPLE MASTECTOMY WITH AXILLARY SENTINEL NODE BIOPSY Left 12/01/2018   Procedure: LEFT SIMPLE MASTECTOMY (Procedure #2);  Surgeon: Aviva Signs, MD;  Location: [facility];  Service: General;  Laterality: Left;    There were no vitals filed for this visit.   Subjective Assessment - 08/02/21 0824     Subjective Daughter reports pt will trial 4 hour day at senior center    Currently in Pain? No/denies    Pain Score 0-No pain                [facility] PT Assessment - 08/02/21 0001       Assessment   Medical Diagnosis Repeated falls    Referring Provider (PT) Phillips Odor, MD                            [facility] Adult PT Treatment/Exercise - 08/02/21 0001       Neuro Re-ed    Neuro Re-ed Details  sequencing/item retrieval task reaching into overhead cabinets 3x3 min. Fine motor coordination and sequencing tasks to improve attention to task and repetition with tapered instruction/feedback. Dynamic standing balance performing BUE ball catch/toss 2x10 reps. Kicking activities 2x2 min to improve single limb loading and stepping outside BOS to facilitate righting reactions. Walking without AD and CGA whilst carrying various size objects around obstacles to improve safety with maneuvering      Knee/Hip Exercises: Aerobic   Recumbent Bike level 2 x 6 min with verbal cues for increased speed to improve rapid alternating movements    Nustep level 2 x 6 min with fast-pace for rapidly alternating movements                    PT Education - 08/02/21 0952     Education Details pt daughter educated on therapy tasks and attention to task performance    Person(s) Educated Patient;Child(ren)    Methods Explanation    Comprehension Verbalized understanding  PT Short Term Goals - 08/02/21 0954       PT SHORT TERM GOAL #1   Title Demo decreased risk for falls as evidenced by 12 sec 5xSTS    Baseline 15 sec    Time 2    Period Weeks    Status On-going    Target Date 08/04/21      PT SHORT TERM GOAL #2   Title Decrease risk for falls as evidenced by time of 19 sec TUG test with 4WW    Baseline 33 sec    Time 2    Period Weeks    Status On-going    Target Date 08/04/21      PT SHORT TERM GOAL #3   Title Demo improved gait velocity as evidenced by distance of 200 ft during 2MWT    Baseline 150 ft with 4WW and CGA due to unsteadiness    Time 2    Period Weeks    Status On-going    Target Date 08/04/21               PT Long Term Goals - 07/21/21 1351       PT LONG TERM GOAL #1   Title Demo independent transfers for safe  maneuvering in small spaces    Baseline CGA with poor safety awareness    Time 4    Period Weeks    Status New    Target Date 08/18/21      PT LONG TERM GOAL #2   Title Demo modified independent ambulation level surfaces    Baseline CGA x 150 ft w/ [id number]    Time 4    Period Weeks    Status New    Target Date 08/18/21      PT LONG TERM GOAL #3   Title Demo low risk for falls as evidenced by time of 12 sec TUG test    Baseline 33 sec with 4WW and CGA    Time 4    Period Weeks    Status New    Target Date 08/18/21      PT LONG TERM GOAL #4   Title Ambulate x 8 stairs with BHR and supervision to facilitate safety with home entry    Baseline CGA x 8 stairs with BHR    Time 4    Period Weeks    Status New    Target Date 08/18/21                   Plan - 08/02/21 0953     Clinical Impression Statement Improved attention to task and sequencing appreciated today with one-step directions with 50% recall but worsening performance with onset of fatigue and task complexity (e.g. one-step to 3-step direction). Demonstrating improved dynamic balance with ability to weight shift and reactive righting response with G-/F+ abilities. Continued sessions to improve general strength, dynamic balance, and activity tolerance to reduce risk for falls    Personal Factors and Comorbidities Comorbidity 3+;Time since onset of injury/illness/exacerbation    Comorbidities dementia, DM, HTN    Examination-Activity Limitations Bend;Carry;Lift;Toileting;Stand;Stairs;Locomotion Level;Transfers    Examination-Participation Restrictions Meal Prep;Community Activity    Stability/Clinical Decision Making Evolving/Moderate complexity    Rehab Potential Good    PT Frequency 2x / week    PT Duration 4 weeks    PT Treatment/Interventions ADLs/Self Care Home Management;DME Instruction;Gait training;Stair training;Functional mobility training;Therapeutic activities;Therapeutic exercise;Balance  training;Neuromuscular re-education;Patient/family education;Manual techniques;Wheelchair mobility training    PT Next Visit Plan General  strengthening for HEP development, emphasis on caregiver education/instruction    PT Home Exercise Plan 5xSTS with caregiver supervision    Consulted and Agree with Plan of Care Patient             Patient will benefit from skilled therapeutic intervention in order to improve the following deficits and impairments:  Abnormal gait, Decreased activity tolerance, Decreased balance, Decreased cognition, Decreased mobility, Decreased endurance, Decreased coordination, Decreased strength, Difficulty walking, Improper body mechanics  Visit Diagnosis: Falls frequently  Difficulty in walking, not elsewhere classified  Unsteadiness on feet     Problem List Patient Active Problem List   Diagnosis Date Noted   Urinary incontinence 07/11/2021   Generalized weakness 07/11/2021   Falls frequently 07/11/2021   Dementia ([place]) 04/24/2021   Memory loss 11/20/2020   Confusion 11/20/2020   Osteopenia 05/08/2019   S/P bilateral mastectomy 12/01/2018   Invasive ductal carcinoma of breast, female, right ([place])    Ductal carcinoma in situ (DCIS) of left breast    Goals of care, counseling/discussion 05/29/2018   Breast cancer, right ([place]) 05/12/2018   Reactive airways dysfunction syndrome ([place]) 03/25/2016   Allergic rhinitis 02/20/2015   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 Sherlyn Lees, PT, DPT Physical Therapist- [place] Office Number: [phone]   [facility] [address] [place], [place], [address] Phone: [phone]   Fax:  [phone]  Name: Whitney Velez MRN: [id number] Date of Birth: Apr 27, 1950

## 2021-08-07 ENCOUNTER — Ambulatory Visit (HOSPITAL_COMMUNITY): Payer: Medicare Other

## 2021-08-07 ENCOUNTER — Other Ambulatory Visit: Payer: Self-pay

## 2021-08-07 DIAGNOSIS — R2681 Unsteadiness on feet: Secondary | ICD-10-CM

## 2021-08-07 DIAGNOSIS — R296 Repeated falls: Secondary | ICD-10-CM

## 2021-08-07 DIAGNOSIS — R262 Difficulty in walking, not elsewhere classified: Secondary | ICD-10-CM

## 2021-08-07 NOTE — Therapy (Signed)
[facility] [address] [place], [place], [address] Phone: [phone]   Fax:  [phone]  Physical Therapy Treatment  Patient Details  Name: Whitney Velez MRN: [id number] Date of Birth: [birth year] Referring Provider (PT): Phillips Odor, MD   Encounter Date: 08/07/2021   PT End of Session - 08/07/21 0830     Visit Number 6    Number of Visits 8    Date for PT Re-Evaluation 08/18/21    Authorization Type UHC Medicare, no auth, no VL    PT Start Time 0824   late arrival   PT Stop Time 0900    PT Time Calculation (min) 36 min    Equipment Utilized During Treatment Gait belt    Activity Tolerance Patient tolerated treatment well;Patient limited by fatigue    Behavior During Therapy [facility] for tasks assessed/performed             Past Medical History:  Diagnosis Date   Asthma    Hypertension     Past Surgical History:  Procedure Laterality Date   ABDOMINAL HYSTERECTOMY     MASTECTOMY MODIFIED RADICAL Right 12/01/2018   Procedure: RIGHT MODIFIED RADICAL MASTECTOMY (Procedure #1);  Surgeon: Aviva Signs, MD;  Location: [facility];  Service: General;  Laterality: Right;   PORTACATH PLACEMENT Left 06/04/2018   Procedure: INSERTION PORT-A-CATH;  Surgeon: Aviva Signs, MD;  Location: [facility];  Service: General;  Laterality: Left;   SIMPLE MASTECTOMY WITH AXILLARY SENTINEL NODE BIOPSY Left 12/01/2018   Procedure: LEFT SIMPLE MASTECTOMY (Procedure #2);  Surgeon: Aviva Signs, MD;  Location: [facility];  Service: General;  Laterality: Left;    There were no vitals filed for this visit.   Subjective Assessment - 08/07/21 0829     Subjective Daughter reports trial at daycare/senior center went well and they anticipate increasing attendence to 3x/wk                [facility] PT Assessment - 08/07/21 0001       Assessment   Medical Diagnosis Repeated falls    Referring Provider (PT) Phillips Odor, MD                            [facility] Adult PT Treatment/Exercise - 08/07/21 0001       Neuro Re-ed    Neuro Re-ed Details  kicking ball to target and attempting to trap ball on bounce back to improve reactive balance 3x10. Bouncing ball off wall and attempting catch 3x10 to improve coordination and reactive balance strategies. Dribbling playground for coordination 3x10. Pt reports dizziness when bending over      Knee/Hip Exercises: Aerobic   Nustep level 2 x 6 min with fast-pace for rapidly alternating movements      Knee/Hip Exercises: Standing   Hip Flexion Stengthening;Both    Hip Flexion Limitations 2 min 6" stair taps 5 lbs weights    Other Standing Knee Exercises sidestepping x 2 min 5 lbs around mat table to improve lateral maneuvering and BUE WBing      Knee/Hip Exercises: Seated   Long Arc Quad Strengthening;Both;3 sets;10 reps    Long Arc Quad Weight 5 lbs.                    PT Education - 08/07/21 0901     Education Details daughter educated on symptom monitoring for vertigo/dizziness when rolling in bed    Person(s) Educated Patient;Child(ren)  Methods Explanation    Comprehension Verbalized understanding              PT Short Term Goals - 08/02/21 0954       PT SHORT TERM GOAL #1   Title Demo decreased risk for falls as evidenced by 12 sec 5xSTS    Baseline 15 sec    Time 2    Period Weeks    Status On-going    Target Date 08/04/21      PT SHORT TERM GOAL #2   Title Decrease risk for falls as evidenced by time of 19 sec TUG test with 4WW    Baseline 33 sec    Time 2    Period Weeks    Status On-going    Target Date 08/04/21      PT SHORT TERM GOAL #3   Title Demo improved gait velocity as evidenced by distance of 200 ft during 2MWT    Baseline 150 ft with 4WW and CGA due to unsteadiness    Time 2    Period Weeks    Status On-going    Target Date 08/04/21               PT Long Term Goals - 07/21/21 1351       PT LONG  TERM GOAL #1   Title Demo independent transfers for safe maneuvering in small spaces    Baseline CGA with poor safety awareness    Time 4    Period Weeks    Status New    Target Date 08/18/21      PT LONG TERM GOAL #2   Title Demo modified independent ambulation level surfaces    Baseline CGA x 150 ft w/ [id number]    Time 4    Period Weeks    Status New    Target Date 08/18/21      PT LONG TERM GOAL #3   Title Demo low risk for falls as evidenced by time of 12 sec TUG test    Baseline 33 sec with 4WW and CGA    Time 4    Period Weeks    Status New    Target Date 08/18/21      PT LONG TERM GOAL #4   Title Ambulate x 8 stairs with BHR and supervision to facilitate safety with home entry    Baseline CGA x 8 stairs with BHR    Time 4    Period Weeks    Status New    Target Date 08/18/21                   Plan - 08/07/21 0902     Clinical Impression Statement Demonstrating improved balance and righting reations with delayed/present stepping/hip strategy with spontaneous recovery (with CGA for safety). Pt reports some dizziness when bending over. Confrontation testing does not demonstrate any ocular abnormalities. Continued sessions indicated to improve strength and activity tolerance to reduce risk for falls. Adapting well to use of RW and demonstrating improved upright posture with gait    Personal Factors and Comorbidities Comorbidity 3+;Time since onset of injury/illness/exacerbation    Comorbidities dementia, DM, HTN    Examination-Activity Limitations Bend;Carry;Lift;Toileting;Stand;Stairs;Locomotion Level;Transfers    Examination-Participation Restrictions Meal Prep;Community Activity    Stability/Clinical Decision Making Evolving/Moderate complexity    Rehab Potential Good    PT Frequency 2x / week    PT Duration 4 weeks    PT Treatment/Interventions ADLs/Self Care Home Management;DME Instruction;Gait training;Stair training;Functional  mobility training;Therapeutic  activities;Therapeutic exercise;Balance training;Neuromuscular re-education;Patient/family education;Manual techniques;Wheelchair mobility training    PT Next Visit Plan General strengthening for HEP development, emphasis on caregiver education/instruction    PT Home Exercise Plan 5xSTS with caregiver supervision    Consulted and Agree with Plan of Care Patient             Patient will benefit from skilled therapeutic intervention in order to improve the following deficits and impairments:  Abnormal gait, Decreased activity tolerance, Decreased balance, Decreased cognition, Decreased mobility, Decreased endurance, Decreased coordination, Decreased strength, Difficulty walking, Improper body mechanics  Visit Diagnosis: Falls frequently  Difficulty in walking, not elsewhere classified  Unsteadiness on feet     Problem List Patient Active Problem List   Diagnosis Date Noted   Urinary incontinence 07/11/2021   Generalized weakness 07/11/2021   Falls frequently 07/11/2021   Dementia ([place]) 04/24/2021   Memory loss 11/20/2020   Confusion 11/20/2020   Osteopenia 05/08/2019   S/P bilateral mastectomy 12/01/2018   Invasive ductal carcinoma of breast, female, right ([place])    Ductal carcinoma in situ (DCIS) of left breast    Goals of care, counseling/discussion 05/29/2018   Breast cancer, right ([place]) 05/12/2018   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 Sherlyn Lees, PT, DPT Physical Therapist- [facility] Office Number: [phone]   [facility] [address] [place], [place], [address] Phone: [phone]   Fax:  [phone]  Name: Whitney Velez MRN: [id number] Date of Birth: 02/03/1950

## 2021-08-09 ENCOUNTER — Telehealth: Payer: Self-pay | Admitting: *Deleted

## 2021-08-09 ENCOUNTER — Ambulatory Visit (INDEPENDENT_AMBULATORY_CARE_PROVIDER_SITE_OTHER): Payer: Medicare Other | Admitting: *Deleted

## 2021-08-09 DIAGNOSIS — F0390 Unspecified dementia without behavioral disturbance: Secondary | ICD-10-CM

## 2021-08-09 DIAGNOSIS — I10 Essential (primary) hypertension: Secondary | ICD-10-CM

## 2021-08-09 NOTE — Chronic Care Management (AMB) (Signed)
  Chronic Care Management   Note  08/09/2021 Name: Whitney Velez MRN: [id number] DOB: Feb 25, 1950  Whitney Velez is a 71 y.o. year old female who is a primary care patient of Erven Colla, DO. Whitney Velez is currently enrolled in care management services. An additional referral for Licensed Clinical SW was placed.   Follow up plan: Telephone appointment with care management team member scheduled for: 08/22/2021  Julian Hy, [facility]  Direct Dial: [phone]

## 2021-08-09 NOTE — Chronic Care Management (AMB) (Signed)
Chronic Care Management   CCM RN Visit Note  08/09/2021 Name: Whitney Velez MRN: [id number] DOB: 04-21-1950  Subjective: Whitney Velez is a 71 y.o. year old female who is a primary care patient of Erven Colla, DO. The care management team was consulted for assistance with disease management and care coordination needs.    Engaged with patient by telephone for initial visit in response to provider referral for case management and/or care coordination services.   Consent to Services:  The patient was given the following information about Chronic Care Management services today, agreed to services, and gave verbal consent: 1. CCM service includes personalized support from designated clinical staff supervised by the primary care provider, including individualized plan of care and coordination with other care providers 2. 24/7 contact phone numbers for assistance for urgent and routine care needs. 3. Service will only be billed when office clinical staff spend 20 minutes or more in a month to coordinate care. 4. Only one practitioner may furnish and bill the service in a calendar month. 5.The patient may stop CCM services at any time (effective at the end of the month) by phone call to the office staff. 6. The patient will be responsible for cost sharing (co-pay) of up to 20% of the service fee (after annual deductible is met). Patient agreed to services and consent obtained.  Patient agreed to services and verbal consent obtained.   Assessment: Review of patient past medical history, allergies, medications, health status, including review of consultants reports, laboratory and other test data, was performed as part of comprehensive evaluation and provision of chronic care management services.   SDOH (Social Determinants of Health) assessments and interventions performed:  SDOH Interventions    Flowsheet Row Most Recent Value  SDOH Interventions   Food Insecurity Interventions Intervention  Not Indicated  Transportation Interventions Intervention Not Indicated        CCM Care Plan  Allergies  Allergen Reactions   Penicillins Other (See Comments)    Nausea, vomiting, dizziness Has patient had a PCN reaction causing immediate rash, facial/tongue/throat swelling, SOB or lightheadedness with hypotension: no Has patient had a PCN reaction causing severe rash involving mucus membranes or skin necrosis: no Has patient had a PCN reaction that required hospitalization: no Has patient had a PCN reaction occurring within the last 10 years: no If all of the above answers are "NO", then may proceed with Cephalosporin use.    Percocet [Oxycodone-Acetaminophen] Diarrhea and Nausea And Vomiting   Ranitidine Itching    Outpatient Encounter Medications as of 08/09/2021  Medication Sig   albuterol (VENTOLIN HFA) 108 (90 Base) MCG/ACT inhaler INHALE TWO PUFFS INTO THE LUNGS EVERY SIX HOURS AS NEEDED FOR WHEEZING   amLODipine (NORVASC) 5 MG tablet TAKE ONE (1) TABLET BY MOUTH EVERY DAY   anastrozole (ARIMIDEX) 1 MG tablet TAKE ONE (1) TABLET BY MOUTH EVERY DAY   aspirin 81 MG tablet Take 81 mg by mouth daily.   Calcium Carb-Cholecalciferol (CALCIUM + VITAMIN D3 PO) Take 1 tablet by mouth 2 (two) times daily.   clonazePAM (KLONOPIN) 0.5 MG tablet Take 1/2 tab p.o. in am and then 1 tab p.o. qhs. prn FOR ANXIETY (MAX 2 TABLETS IN 24 HOURS) (Patient taking differently: Take 1/2 tab p.o. in am and then 1 tab p.o. qhs. prn FOR ANXIETY (MAX 2 TABLETS IN 24 HOURS)   08/09/21  Taking one tab at hs)   denosumab (PROLIA) 60 MG/ML SOSY injection Inject 60 mg into the  skin every 6 (six) months.   Dextromethorphan-quiNIDine (NUEDEXTA) 20-10 MG capsule Nuedexta 20 mg-10 mg capsule  Take 1 capsule every 12 hours by oral route.  1 daily for 1 month and then increase to 1 twice a day   FLOVENT HFA 44 MCG/ACT inhaler INHALE TWO PUFFS INTO THE LUNGS IN THE MORNING AND AT BEDTIME TO PREVENT WHEEZING   ibuprofen  (ADVIL) 200 MG tablet Take 200 mg by mouth at bedtime as needed.   midodrine (PROAMATINE) 5 MG tablet Take 10 mg by mouth 3 (three) times daily with meals.   montelukast (SINGULAIR) 10 MG tablet TAKE ONE TABLET (10MG TOTAL) BY MOUTH ATBEDTIME   OVER THE COUNTER MEDICATION Stool softner once daily   potassium chloride SA (KLOR-CON) 20 MEQ tablet TAKE ONE TABLET (20MEQ TOTAL) BY MOUTH TWO TIMES DAILY   sertraline (ZOLOFT) 100 MG tablet Take 150 mg by mouth daily.   anastrozole (ARIMIDEX) 1 MG tablet TAKE ONE (1) TABLET BY MOUTH EVERY DAY (Patient not taking: Reported on 08/09/2021)   anastrozole (ARIMIDEX) 1 MG tablet TAKE ONE (1) TABLET BY MOUTH EVERY DAY (Patient not taking: Reported on 08/09/2021)   hydrochlorothiazide (HYDRODIURIL) 25 MG tablet TAKE ONE TABLET (25MG TOTAL) BY MOUTH DAILY (Patient not taking: Reported on 08/09/2021)   No facility-administered encounter medications on file as of 08/09/2021.    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   Allergic rhinitis 02/20/2015   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[place]) 02/20/2015   Essential hypertension, benign 12/16/2013   Type 2 diabetes mellitus ([place]) 12/16/2013    Conditions to be addressed/monitored:HTN and Dementia  Care Plan : Dementia (Adult)  Updates made by Kassie Mends, RN since 08/09/2021 12:00 AM     Problem: Behavioral Symptoms   Priority: High     Long-Range Goal: Behavior Symptoms Management   Start  Date: 08/09/2021  Expected End Date: 02/09/2022  This Visit's Progress: On track  Priority: High  Note:   Current Barriers:  Ineffective Self Health Maintenance in a patient with dementia- patient lives with her daughter [name], son in law and grandson, attends adult day care 2 x per week, is now attending outpatient physical therapy due to recent decline with ambulation, per daughter- patient's condition related to dementia, weakness, falls, etc has worsened over the past few weeks, sometimes pt does not recognize family members and wanders at times, does not sleep as well, daughter has applied for medicaid and is awaiting response and will be considering her options for long term care planning as keeping patient at home may not be sustainable, daughter would like input from LCSW for long term care planning. Daughter reports she is going to start going to a dementia support group, reports keep pt safe is number one priority. Patient does not check blood sugar and is on no medication for diabetes and last AIC <6. Unable to self administer medications as prescribed- daughter provides complete oversight over medications Unable to perform ADLs independently- patient is completely  dependent on caregivers for assistance with bathing, cooking, Unable to perform IADLs independently- completely dependent Clinical Goal(s):  Collaboration with Erven Colla, DO regarding development and update of comprehensive plan of care as evidenced by provider attestation and co-signature Inter-disciplinary care team collaboration (see longitudinal plan of care) patient will work with care management team to address care coordination and chronic disease management needs related to Disease Management Care Coordination Medication Management and Education Caregiver Stress support   Interventions:  Evaluation of current treatment plan related to Dementia, ADL IADL limitations self-management and patient's adherence to plan  as established by provider. Collaboration with Erven Colla, DO regarding development and update of comprehensive plan of care as evidenced by provider attestation       and co-signature Inter-disciplinary care team collaboration (see longitudinal plan of care) Discussed plans with patient for ongoing care management follow up and provided patient with direct contact information for care management team Referral placed for LCSW for assistance with long term care planning, resources Reviewed safety as a priority for patient Encouraged offering small, frequent meals and adequate fluids throughout the day Limit day time naps to help with night time sleep Always use walker, DME for safety, work on exercises prescribed by outpatient physical therapist Education materials- dementia- sent via My Chart Self Care Activities:  Attends all scheduled provider appointments Calls provider office for new concerns or questions Patient Goals: - stay in touch with my family and friends as much as possible - keep pathways clear to prevent falls, stay well hydrated and do not ambulate if dizzy - take all medications as prescribed - follow up on medicaid - expect a call from Education officer, museum for resources/ guidance - attend caregiver dementia support group - call your doctor for any new questions or concerns - look over education sent via My Chart- dementia Follow Up Plan: Telephone follow up appointment with care management team member scheduled for:  09/13/2021    Task: Develop Strategies to Manage Behavior      Care Plan : Hypertension (Adult)  Updates made by Kassie Mends, RN since 08/09/2021 12:00 AM     Problem: Hypertension (Hypertension)   Priority: Medium     Long-Range Goal: Hypertension Monitored   Start Date: 08/09/2021  Expected End Date: 02/09/2022  This Visit's Progress: On track  Priority: Medium  Note:   Objective:  Last practice recorded BP readings:  BP Readings from Last 3  Encounters:  07/11/21 (!) 158/79  07/08/21 114/64  04/26/21 (!) 143/76   Most recent eGFR/CrCl: No results found for: EGFR  No components found for: CRCL Current Barriers:  Knowledge Deficits related to basic understanding of hypertension pathophysiology and self care management- blood pressure is checked at times, pt has dementia and lives with daughter, goes to adult day care 2 days per week, is incontinent, daughter reports she tries to encourage healthy diet, pt does not ask for food, if food is not offered pt would most likely not eat.  Patient has frequent falls and per daughter, Bobbye [place] is now taken at night only, midodrine was added and diuretic discontinued due to orthostatic blood pressure issues which could contribute to falls. Cognitive Deficits- patient has dementia which has worsened over the past few weeks, mobility has declined also. Unable to self administer medications as prescribed- daughter provides complete oversight Unable to perform ADLs independently- daughter / family assists with all aspects of care Unable to perform IADLs independently- complete dependent on family Case Manager Clinical Goal(s):  patient will verbalize understanding of plan for hypertension management patient will demonstrate improved adherence to prescribed treatment plan for hypertension as evidenced by taking all medications as prescribed, monitoring and recording blood pressure as directed, adhering to low sodium/DASH diet Interventions:  Collaboration with Erven Colla, DO regarding development and update of comprehensive plan of care as evidenced by provider attestation and co-signature Inter-disciplinary care team collaboration (see longitudinal plan of care) Evaluation of current treatment plan related to hypertension self management and patient's adherence to plan as established by provider. Provided education to patient's daughter re: stroke prevention, s/s of heart attack and stroke, DASH  diet, complications of uncontrolled blood pressure Reviewed medications with patient and discussed importance of compliance Discussed plans with patient for ongoing care management follow up and provided patient with direct contact information for care management team Advised patient's daughter providing education and rationale, to monitor blood pressure daily and record, calling PCP for findings outside established parameters.  Reviewed scheduled/upcoming provider appointments including: 9/12 primary care provider, 9/14 Cancer center injection/ office visit/ labs Education on low sodium diet sent via My Chart Referral for LCSW placed Reviewed safety precautions Self-Care Activities: Attends all scheduled provider appointments Calls provider office for new concerns, questions, or BP outside discussed parameters Checks BP and records as discussed Follows a low sodium diet/DASH diet Patient Goals: - check blood pressure 3 times per week - choose a place to take my blood pressure (home, clinic or office, retail store) - write blood pressure results in a log or diary  - follow low salt diet - look over education in My Chart- low sodium diet - continue outpatient physical therapy and practice exercises  Follow Up Plan: Telephone follow up appointment with care management team member scheduled for: 09/13/2021     Plan:Telephone follow up appointment with care management team member scheduled for:  09/13/2021  Jacqlyn Larsen [facility], BSN RN Case Manager [place] [phone]

## 2021-08-09 NOTE — Patient Instructions (Signed)
Visit Information   PATIENT GOALS:   Goals Addressed             This Visit's Progress    Develop long range plan for mangement of dementia       Timeframe:  Long-Range Goal Priority:  High Start Date:       08/09/2021                      Expected End Date:     02/09/2022                  Follow Up Date 09/13/2021   - stay in touch with my family and friends as much as possible - keep pathways clear to prevent falls, stay well hydrated and do not ambulate if dizzy - take all medications as prescribed - follow up on medicaid - expect a call from Education officer, museum for resources/ guidance - attend caregiver dementia support group - call your doctor for any new questions or concerns - look over education sent via My Chart- dementia   Why is this important?   As we age, or sometimes because we have an illness, it feels like our memory and ability to figure things out is not very good.  There are things you can do to keep your memory and your thinking as strong as possible.    Notes:      Track and Manage My Blood Pressure-Hypertension       Timeframe:  Long-Range Goal Priority:  Medium Start Date:   08/09/2021                          Expected End Date:   02/09/2022                    Follow Up Date 09/13/2021   - check blood pressure 3 times per week - choose a place to take my blood pressure (home, clinic or office, retail store) - write blood pressure results in a log or diary  - follow low salt diet - look over education in My Chart- low sodium diet - continue outpatient physical therapy and practice exercises     Why is this important?   You won't feel high blood pressure, but it can still hurt your blood vessels.  High blood pressure can cause heart or kidney problems. It can also cause a stroke.  Making lifestyle changes like losing a little weight or eating less salt will help.  Checking your blood pressure at home and at different times of the day can help to control  blood pressure.  If the doctor prescribes medicine remember to take it the way the doctor ordered.  Call the office if you cannot afford the medicine or if there are questions about it.     Notes:         Consent to CCM Services: Ms. Tagliaferri was given information about Chronic Care Management services including:  CCM service includes personalized support from designated clinical staff supervised by her physician, including individualized plan of care and coordination with other care providers 24/7 contact phone numbers for assistance for urgent and routine care needs. Service will only be billed when office clinical staff spend 20 minutes or more in a month to coordinate care. Only one practitioner may furnish and bill the service in a calendar month. The patient may stop CCM services at any time (effective at the  end of the month) by phone call to the office staff. The patient will be responsible for cost sharing (co-pay) of up to 20% of the service fee (after annual deductible is met).  Patient agreed to services and verbal consent obtained.   Patient verbalizes understanding of instructions provided today and agrees to view in [place].   Telephone follow up appointment with care management team member scheduled for:  9/14/2022Low-Sodium Eating Plan Sodium, which is an element that makes up salt, helps you maintain a healthy balance of fluids in your body. Too much sodium can increase your bloodpressure and cause fluid and waste to be held in your body. Your health care provider or dietitian may recommend following this plan if you have high blood pressure (hypertension), kidney disease, liver disease, or heart failure. Eating less sodium can help lower your blood pressure, reduce swelling, and protect your heart, liver, andkidneys. What are tips for following this plan? Reading food labels The Nutrition Facts label lists the amount of sodium in one serving of the food. If you eat more  than one serving, you must multiply the listed amount of sodium by the number of servings. Choose foods with less than 140 mg of sodium per serving. Avoid foods with 300 mg of sodium or more per serving. Shopping  Look for lower-sodium products, often labeled as "low-sodium" or "no salt added." Always check the sodium content, even if foods are labeled as "unsalted" or "no salt added." Buy fresh foods. Avoid canned foods and pre-made or frozen meals. Avoid canned, cured, or processed meats. Buy breads that have less than 80 mg of sodium per slice.  Cooking  Eat more home-cooked food and less restaurant, buffet, and fast food. Avoid adding salt when cooking. Use salt-free seasonings or herbs instead of table salt or sea salt. Check with your health care provider or pharmacist before using salt substitutes. Cook with plant-based oils, such as canola, sunflower, or olive oil.  Meal planning When eating at a restaurant, ask that your food be prepared with less salt or no salt, if possible. Avoid dishes labeled as brined, pickled, cured, smoked, or made with soy sauce, miso, or teriyaki sauce. Avoid foods that contain MSG (monosodium glutamate). MSG is sometimes added to [place] food, bouillon, and some canned foods. Make meals that can be grilled, baked, poached, roasted, or steamed. These are generally made with less sodium. General information Most people on this plan should limit their sodium intake to 1,500-2,000 mg (milligrams) of sodium each day. What foods should I eat? Fruits Fresh, frozen, or canned fruit. Fruit juice. Vegetables Fresh or frozen vegetables. "No salt added" canned vegetables. "No salt added"tomato sauce and paste. Low-sodium or reduced-sodium tomato and vegetable juice. Grains Low-sodium cereals, including oats, puffed wheat and rice, and shredded wheat. Low-sodium crackers. Unsalted rice. Unsalted pasta. Low-sodium bread.Whole-grain breads and whole-grain  pasta. Meats and other proteins Fresh or frozen (no salt added) meat, poultry, seafood, and fish. Low-sodium canned tuna and salmon. Unsalted nuts. Dried peas, beans, and lentils withoutadded salt. Unsalted canned beans. Eggs. Unsalted nut butters. Dairy Milk. Soy milk. Cheese that is naturally low in sodium, such as ricotta cheese, fresh mozzarella, or Swiss cheese. Low-sodium or reduced-sodium cheese. Creamcheese. Yogurt. Seasonings and condiments Fresh and dried herbs and spices. Salt-free seasonings. Low-sodium mustard and ketchup. Sodium-free salad dressing. Sodium-free light mayonnaise. Fresh orrefrigerated horseradish. Lemon juice. Vinegar. Other foods Homemade, reduced-sodium, or low-sodium soups. Unsalted popcorn and pretzels.Low-salt or salt-free chips. The items listed above may  not be a complete list of foods and beverages you can eat. Contact a dietitian for more information. What foods should I avoid? Vegetables Sauerkraut, pickled vegetables, and relishes. Olives. [place] fries. Onion rings. Regular canned vegetables (not low-sodium or reduced-sodium). Regular canned tomato sauce and paste (not low-sodium or reduced-sodium). Regular tomato and vegetable juice (not low-sodium or reduced-sodium). Frozenvegetables in sauces. Grains Instant hot cereals. Bread stuffing, pancake, and biscuit mixes. Croutons. Seasoned rice or pasta mixes. Noodle soup cups. Boxed or frozen macaroni andcheese. Regular salted crackers. Self-rising flour. Meats and other proteins Meat or fish that is salted, canned, smoked, spiced, or pickled. Precooked or cured meat, such as sausages or meat loaves. Berniece Salines. Ham. Pepperoni. Hot dogs. Corned beef. Chipped beef. Salt pork. Jerky. Pickled herring. Anchovies andsardines. Regular canned tuna. Salted nuts. Dairy Processed cheese and cheese spreads. Hard cheeses. Cheese curds. Blue cheese.Feta cheese. String cheese. Regular cottage cheese. Buttermilk. Canned milk. Fats  and oils Salted butter. Regular margarine. Ghee. Bacon fat. Seasonings and condiments Onion salt, garlic salt, seasoned salt, table salt, and sea salt. Canned and packaged gravies. Worcestershire sauce. Tartar sauce. Barbecue sauce. Teriyaki sauce. Soy sauce, including reduced-sodium. Steak sauce. Fish sauce. Oyster sauce. Cocktail sauce. Horseradish that you find on the shelf. Regular ketchup and mustard. Meat flavorings and tenderizers. Bouillon cubes. Hot sauce. Pre-made or packaged marinades. Pre-made or packaged taco seasonings. Relishes.Regular salad dressings. Salsa. Other foods Salted popcorn and pretzels. Corn chips and puffs. Potato and tortilla chips.Canned or dried soups. Pizza. Frozen entrees and pot pies. The items listed above may not be a complete list of foods and beverages you should avoid. Contact a dietitian for more information. Summary Eating less sodium can help lower your blood pressure, reduce swelling, and protect your heart, liver, and kidneys. Most people on this plan should limit their sodium intake to 1,500-2,000 mg (milligrams) of sodium each day. Canned, boxed, and frozen foods are high in sodium. Restaurant foods, fast foods, and pizza are also very high in sodium. You also get sodium by adding salt to food. Try to cook at home, eat more fresh fruits and vegetables, and eat less fast food and canned, processed, or prepared foods. This information is not intended to replace advice given to you by your health care provider. Make sure you discuss any questions you have with your healthcare provider. Document Revised: 01/22/2020 Document Reviewed: 11/18/2019 Elsevier Patient Education  2022 [employer]. [email or web address]">  Dementia Caregiver Guide Dementia is a term used to describe a number of symptoms that affect memory and thinking. The most common symptoms include: Memory loss. Trouble with language and  communication. Trouble concentrating. Poor judgment and problems with reasoning. Wandering from home or public places. Extreme anxiety or depression. Being suspicious or having angry outbursts and accusations. Child-like behavior and language. Dementia can be frightening and confusing. And taking care of someone with dementia can be challenging. This guide provides tips to help you whenproviding care for a person with dementia. How to help manage lifestyle changes Dementia usually gets worse slowly over time. In the early stages, people with dementia can stay independent and safe with some help. In later stages, they need help with daily tasks such as dressing, grooming, and using the bathroom. There are actions you can take to help a person manage his or her life whileliving with this condition. Communicating When the person is talking or seems frustrated, make eye contact and hold the person's hand. Ask specific questions that need yes or no answers.  Use simple words, short sentences, and a calm voice. Only give one direction at a time. When offering choices, limit the person to just one or two. Avoid correcting the person in a negative way. If the person is struggling to find the right words, gently try to help him or her. Preventing injury  Keep floors clear of clutter. Remove rugs, magazine racks, and floor lamps. Keep hallways well lit, especially at night. Put a handrail and nonslip mat in the bathtub or shower. Put childproof locks on cabinets that contain dangerous items, such as medicines, alcohol, guns, toxic cleaning items, sharp tools or utensils, matches, and lighters. For doors to the outside of the house, put the locks in places where the person cannot see or reach them easily. This will help ensure that the person does not wander out of the house and get lost. Be prepared for emergencies. Keep a list of emergency phone numbers and addresses in a convenient area. Remove car  keys and lock garage doors so that the person does not try to get in the car and drive. Have the person wear a bracelet that tracks locations and identifies the person as having memory problems. This should be worn at all times for safety.  Helping with daily life  Keep the person on track with his or her routine. Try to identify areas where the person may need help. Be supportive, patient, calm, and encouraging. Gently remind the person that adjusting to changes takes time. Help with the tasks that the person has asked for help with. Keep the person involved in daily tasks and decisions as much as possible. Encourage conversation, but try not to get frustrated if the person struggles to find words or does not seem to appreciate your help.  How to recognize stress Look for signs of stress in yourself and in the person you are caring for. If you notice signs of stress, take steps to manage it. Symptoms of stress include: Feeling anxious, irritable, frustrated, or angry. Denying that the person has dementia or that his or her symptoms will not improve. Feeling depressed, hopeless, or unappreciated. Difficulty sleeping. Difficulty concentrating. Developing stress-related health problems. Feeling like you have too little time for your own life. Follow these instructions at home: Take care of your health Make sure that you and the person you are caring for: Get regular sleep. Exercise regularly. Eat regular, nutritious meals. Take over-the-counter and prescription medicines only as told by your health care providers. Drink enough fluid to keep your urine pale yellow. Attend all scheduled health care appointments.  General instructions Join a support group with others who are caregivers. Ask about respite care resources. Respite care can provide short-term care for the person so that you can have a regular break from the stress of caregiving. Consider any safety risks and take steps to  avoid them. Organize medicines in a pill box for each day of the week. Create a plan to handle any legal or financial matters. Get legal or financial advice if needed. Keep a calendar in a central location to remind the person of appointments or other activities. Where to find support: Many individuals and organizations offer support. These include: Support groups for people with dementia. Support groups for caregivers. Counselors or therapists. Home health care services. Adult day care centers. Where to find more information Centers for Disease Control and Prevention: http://www.wolf.info/ Alzheimer's Association: CapitalMile.co.nz Family Caregiver Alliance: www.caregiver.[place]: www.alzfdn.org Contact a health care provider if: The  person's health is rapidly getting worse. You are no longer able to care for the person. Caring for the person is affecting your physical and emotional health. You are feeling depressed or anxious about caring for the person. Get help right away if: The person threatens himself or herself, you, or anyone else. You feel depressed or sad, or feel that you want to harm yourself. If you ever feel like your loved one may hurt himself or herself or others, or if he or she shares thoughts about taking his or her own life, get help right away. You can go to your nearest emergency department or: Call your local emergency services (911 in the [place] Call a suicide crisis helpline, such as the [facility] at 804-717-6500. This is open 24 hours a day in the [place] Text the Crisis Text Line at 919-511-0248 (in the [place].). Summary Dementia is a term used to describe a number of symptoms that affect memory and thinking. Dementia usually gets worse slowly over time. Take steps to reduce the person's risk of injury and to plan for future care. Caregivers need support, relief from caregiving, and time for their own lives. This information  is not intended to replace advice given to you by your health care provider. Make sure you discuss any questions you have with your healthcare provider. Document Revised: 05/02/2020 Document Reviewed: 05/02/2020 Elsevier Patient Education  2022 [facility], BSN RN Case Manager [facility] [phone]   CLINICAL CARE PLAN: Patient Care Plan: Dementia (Adult)     Problem Identified: Behavioral Symptoms   Priority: High     Long-Range Goal: Behavior Symptoms Management   Start Date: 08/09/2021  Expected End Date: 02/09/2022  This Visit's Progress: On track  Priority: High  Note:   Current Barriers:  Ineffective Self Health Maintenance in a patient with dementia- patient lives with her daughter [name], son in law and grandson, attends adult day care 2 x per week, is now attending outpatient physical therapy due to recent decline with ambulation, per daughter- patient's condition related to dementia, weakness, falls, etc has worsened over the past few weeks, sometimes pt does not recognize family members and wanders at times, does not sleep as well, daughter has applied for medicaid and is awaiting response and will be considering her options for long term care planning as keeping patient at home may not be sustainable, daughter would like input from LCSW for long term care planning. Daughter reports she is going to start going to a dementia support group, reports keep pt safe is number one priority. Patient does not check blood sugar and is on no medication for diabetes and last AIC <6. Unable to self administer medications as prescribed- daughter provides complete oversight over medications Unable to perform ADLs independently- patient is completely dependent on caregivers for assistance with bathing, cooking, Unable to perform IADLs independently- completely dependent Clinical Goal(s):  Collaboration with Erven Colla, DO regarding development and  update of comprehensive plan of care as evidenced by provider attestation and co-signature Inter-disciplinary care team collaboration (see longitudinal plan of care) patient will work with care management team to address care coordination and chronic disease management needs related to Disease Management Care Coordination Medication Management and Education Caregiver Stress support   Interventions:  Evaluation of current treatment plan related to Dementia, ADL IADL limitations self-management and patient's adherence to plan as established by provider. Collaboration with Erven Colla, DO regarding development and update  of comprehensive plan of care as evidenced by provider attestation       and co-signature Inter-disciplinary care team collaboration (see longitudinal plan of care) Discussed plans with patient for ongoing care management follow up and provided patient with direct contact information for care management team Referral placed for LCSW for assistance with long term care planning, resources Reviewed safety as a priority for patient Encouraged offering small, frequent meals and adequate fluids throughout the day Limit day time naps to help with night time sleep Always use walker, DME for safety, work on exercises prescribed by outpatient physical therapist Education materials- dementia- sent via My Chart Self Care Activities:  Attends all scheduled provider appointments Calls provider office for new concerns or questions Patient Goals: - stay in touch with my family and friends as much as possible - keep pathways clear to prevent falls, stay well hydrated and do not ambulate if dizzy - take all medications as prescribed - follow up on medicaid - expect a call from Education officer, museum for resources/ guidance - attend caregiver dementia support group - call your doctor for any new questions or concerns - look over education sent via My Chart- dementia Follow Up Plan: Telephone  follow up appointment with care management team member scheduled for:  09/13/2021    Task: Develop Strategies to Manage Behavior      Patient Care Plan: Hypertension (Adult)     Problem Identified: Hypertension (Hypertension)   Priority: Medium     Long-Range Goal: Hypertension Monitored   Start Date: 08/09/2021  Expected End Date: 02/09/2022  This Visit's Progress: On track  Priority: Medium  Note:   Objective:  Last practice recorded BP readings:  BP Readings from Last 3 Encounters:  07/11/21 (!) 158/79  07/08/21 114/64  04/26/21 (!) 143/76   Most recent eGFR/CrCl: No results found for: EGFR  No components found for: CRCL Current Barriers:  Knowledge Deficits related to basic understanding of hypertension pathophysiology and self care management- blood pressure is checked at times, pt has dementia and lives with daughter, goes to adult day care 2 days per week, is incontinent, daughter reports she tries to encourage healthy diet, pt does not ask for food, if food is not offered pt would most likely not eat.  Patient has frequent falls and per daughter, Bobbye [place] is now taken at night only, midodrine was added and diuretic discontinued due to orthostatic blood pressure issues which could contribute to falls. Cognitive Deficits- patient has dementia which has worsened over the past few weeks, mobility has declined also. Unable to self administer medications as prescribed- daughter provides complete oversight Unable to perform ADLs independently- daughter / family assists with all aspects of care Unable to perform IADLs independently- complete dependent on family Case Manager Clinical Goal(s):  patient will verbalize understanding of plan for hypertension management patient will demonstrate improved adherence to prescribed treatment plan for hypertension as evidenced by taking all medications as prescribed, monitoring and recording blood pressure as directed, adhering to low sodium/DASH  diet Interventions:  Collaboration with Erven Colla, DO regarding development and update of comprehensive plan of care as evidenced by provider attestation and co-signature Inter-disciplinary care team collaboration (see longitudinal plan of care) Evaluation of current treatment plan related to hypertension self management and patient's adherence to plan as established by provider. Provided education to patient's daughter re: stroke prevention, s/s of heart attack and stroke, DASH diet, complications of uncontrolled blood pressure Reviewed medications with patient and discussed importance of compliance Discussed  plans with patient for ongoing care management follow up and provided patient with direct contact information for care management team Advised patient's daughter providing education and rationale, to monitor blood pressure daily and record, calling PCP for findings outside established parameters.  Reviewed scheduled/upcoming provider appointments including: 9/12 primary care provider, 9/14 Cancer center injection/ office visit/ labs Education on low sodium diet sent via My Chart Referral for LCSW placed Reviewed safety precautions Self-Care Activities: Attends all scheduled provider appointments Calls provider office for new concerns, questions, or BP outside discussed parameters Checks BP and records as discussed Follows a low sodium diet/DASH diet Patient Goals: - check blood pressure 3 times per week - choose a place to take my blood pressure (home, clinic or office, retail store) - write blood pressure results in a log or diary  - follow low salt diet - look over education in My Chart- low sodium diet - continue outpatient physical therapy and practice exercises  Follow Up Plan: Telephone follow up appointment with care management team member scheduled for: 09/13/2021

## 2021-08-10 ENCOUNTER — Other Ambulatory Visit: Payer: Self-pay

## 2021-08-10 ENCOUNTER — Ambulatory Visit (HOSPITAL_COMMUNITY): Payer: Medicare Other

## 2021-08-10 DIAGNOSIS — R262 Difficulty in walking, not elsewhere classified: Secondary | ICD-10-CM | POA: Diagnosis not present

## 2021-08-10 DIAGNOSIS — R2681 Unsteadiness on feet: Secondary | ICD-10-CM

## 2021-08-10 DIAGNOSIS — R296 Repeated falls: Secondary | ICD-10-CM

## 2021-08-10 NOTE — Therapy (Signed)
[facility] [address] [place], [place], [address] Phone: [phone]   Fax:  [phone]  Physical Therapy Treatment  Patient Details  Name: Whitney Velez MRN: [id number] Date of Birth: 09-12-1950 Referring Provider (PT): Phillips Odor, MD   Encounter Date: 08/10/2021   PT End of Session - 08/10/21 1353     Visit Number 7    Number of Visits 8    Date for PT Re-Evaluation 08/18/21    Authorization Type UHC Medicare, no auth, no VL    PT Start Time 1345    PT Stop Time 1430    PT Time Calculation (min) 45 min    Equipment Utilized During Treatment Gait belt    Activity Tolerance Patient tolerated treatment well;Patient limited by fatigue    Behavior During Therapy [facility] for tasks assessed/performed             Past Medical History:  Diagnosis Date   Asthma    Hypertension     Past Surgical History:  Procedure Laterality Date   ABDOMINAL HYSTERECTOMY     MASTECTOMY MODIFIED RADICAL Right 12/01/2018   Procedure: RIGHT MODIFIED RADICAL MASTECTOMY (Procedure #1);  Surgeon: Aviva Signs, MD;  Location: [facility];  Service: General;  Laterality: Right;   PORTACATH PLACEMENT Left 06/04/2018   Procedure: INSERTION PORT-A-CATH;  Surgeon: Aviva Signs, MD;  Location: [facility];  Service: General;  Laterality: Left;   SIMPLE MASTECTOMY WITH AXILLARY SENTINEL NODE BIOPSY Left 12/01/2018   Procedure: LEFT SIMPLE MASTECTOMY (Procedure #2);  Surgeon: Aviva Signs, MD;  Location: [facility];  Service: General;  Laterality: Left;    There were no vitals filed for this visit.   Subjective Assessment - 08/10/21 1352     Subjective Daughter reports pt has been demonstrating increased trunk flexion and pushing walker too far to front requiring increased cues for use of RW and maintaining proper posture and use of AD                [facility] PT Assessment - 08/10/21 0001       Assessment   Medical Diagnosis Repeated falls    Referring  Provider (PT) Phillips Odor, MD                           [facility] Adult PT Treatment/Exercise - 08/10/21 0001       Knee/Hip Exercises: Aerobic   Nustep level 2 x 6 min with fast-pace for rapidly alternating movements      Knee/Hip Exercises: Standing   Hip Flexion Stengthening;Both;4 sets;10 reps    Hip Flexion Limitations 6" stair tap    Hip ADduction Strengthening;Both;3 sets;10 reps    Hip ADduction Limitations 3#    Other Standing Knee Exercises static standing feet together/apart 3x15 sec. 3x10 sec feet together eyes closed      Knee/Hip Exercises: Seated   Long Arc Quad Strengthening;Both;4 sets;10 reps    Long Arc Quad Weight 3 lbs.    Sit to Sand 20 reps;with UE support                      PT Short Term Goals - 08/02/21 0954       PT SHORT TERM GOAL #1   Title Demo decreased risk for falls as evidenced by 12 sec 5xSTS    Baseline 15 sec    Time 2    Period Weeks    Status On-going  Target Date 08/04/21      PT SHORT TERM GOAL #2   Title Decrease risk for falls as evidenced by time of 19 sec TUG test with 4WW    Baseline 33 sec    Time 2    Period Weeks    Status On-going    Target Date 08/04/21      PT SHORT TERM GOAL #3   Title Demo improved gait velocity as evidenced by distance of 200 ft during 2MWT    Baseline 150 ft with 4WW and CGA due to unsteadiness    Time 2    Period Weeks    Status On-going    Target Date 08/04/21               PT Long Term Goals - 07/21/21 1351       PT LONG TERM GOAL #1   Title Demo independent transfers for safe maneuvering in small spaces    Baseline CGA with poor safety awareness    Time 4    Period Weeks    Status New    Target Date 08/18/21      PT LONG TERM GOAL #2   Title Demo modified independent ambulation level surfaces    Baseline CGA x 150 ft w/ [id number]    Time 4    Period Weeks    Status New    Target Date 08/18/21      PT LONG TERM GOAL #3   Title Demo low risk  for falls as evidenced by time of 12 sec TUG test    Baseline 33 sec with 4WW and CGA    Time 4    Period Weeks    Status New    Target Date 08/18/21      PT LONG TERM GOAL #4   Title Ambulate x 8 stairs with BHR and supervision to facilitate safety with home entry    Baseline CGA x 8 stairs with BHR    Time 4    Period Weeks    Status New    Target Date 08/18/21                   Plan - 08/10/21 1426     Clinical Impression Statement Increased postural sway with standing unsupported. LOB when eyes closed during balance exercises. Continued POC indicated to improve strength and dynamic balance to reduce risk for falls    Personal Factors and Comorbidities Comorbidity 3+;Time since onset of injury/illness/exacerbation    Comorbidities dementia, DM, HTN    Examination-Activity Limitations [place];Carry;Lift;Toileting;Stand;Stairs;Locomotion Level;Transfers    Examination-Participation Restrictions Meal Prep;Community Activity    Stability/Clinical Decision Making Evolving/Moderate complexity    Rehab Potential Good    PT Frequency 2x / week    PT Duration 4 weeks    PT Treatment/Interventions ADLs/Self Care Home Management;DME Instruction;Gait training;Stair training;Functional mobility training;Therapeutic activities;Therapeutic exercise;Balance training;Neuromuscular re-education;Patient/family education;Manual techniques;Wheelchair mobility training    PT Next Visit Plan General strengthening for HEP development, emphasis on caregiver education/instruction    PT Home Exercise Plan 5xSTS with caregiver supervision    Consulted and Agree with Plan of Care Patient             Patient will benefit from skilled therapeutic intervention in order to improve the following deficits and impairments:  Abnormal gait, Decreased activity tolerance, Decreased balance, Decreased cognition, Decreased mobility, Decreased endurance, Decreased coordination, Decreased strength, Difficulty  walking, Improper body mechanics  Visit Diagnosis: Falls frequently  Difficulty in walking,  not elsewhere classified  Unsteadiness on feet     Problem List Patient Active Problem List   Diagnosis Date Noted   Urinary incontinence 07/11/2021   Generalized weakness 07/11/2021   Falls frequently 07/11/2021   Dementia ([place]) 04/24/2021   Memory loss 11/20/2020   Confusion 11/20/2020   Osteopenia 05/08/2019   S/P bilateral mastectomy 12/01/2018   Invasive ductal carcinoma of breast, female, right ([place])    Ductal carcinoma in situ (DCIS) of left breast    Goals of care, counseling/discussion 05/29/2018   Breast cancer, right ([place]) 05/12/2018   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 [place], [place], [address] Phone: [phone]   Fax:  [phone]  Name: Whitney Velez MRN: [id number] Date of Birth: 11-05-1950

## 2021-08-16 ENCOUNTER — Encounter (HOSPITAL_COMMUNITY): Payer: Self-pay

## 2021-08-16 ENCOUNTER — Ambulatory Visit: Payer: Medicare Other | Admitting: Licensed Clinical Social Worker

## 2021-08-16 ENCOUNTER — Telehealth: Payer: Self-pay | Admitting: Licensed Clinical Social Worker

## 2021-08-16 DIAGNOSIS — Z719 Counseling, unspecified: Secondary | ICD-10-CM

## 2021-08-16 DIAGNOSIS — F0390 Unspecified dementia without behavioral disturbance: Secondary | ICD-10-CM

## 2021-08-16 DIAGNOSIS — Z7189 Other specified counseling: Secondary | ICD-10-CM

## 2021-08-16 NOTE — Patient Instructions (Signed)
Licensed Clinical Social Worker Visit Information  Goals we discussed today:   Goals Addressed             This Visit's Progress    Advance Directive in place       Patient Goals/Self-Care Activities : Over the next 30 days Review educational information on Advance Directive  Complete Advance Directive packet,  Discuss with PCP capacity to appoint daughter as HPOA Have advance directive notarized and provide a copy to provider office      Find Help in My Community       Timeframe:  Short-Term Goal Priority:  High Start Date:  08/16/2021                           Expected End Date:                       Follow Up Date 08/30/21   Patient Goals/Self-Care Activities: Over the next 14 days Call Department of Social Services to follow-up on Medicaid application  Call PACE program to see if this is an option  Call [place] [facility] to follow up on caregiver resources and support I will coordinate with your PCP to assist with an FL2 when needed I e-mailed you a CAPS application     Why is this important?   Knowing how and where to find help for yourself or family in your neighborhood and community is an important skill.         Materials provided: Yes: community information   Ms. Elich was given information about Care Management services today including:  Care Management services include personalized support from designated clinical staff supervised by her physician, including individualized plan of care and coordination with other care providers 24/7 contact phone numbers for assistance for urgent and routine care needs. The patient may stop Care Management services at any time by phone call to the office staff.   Patient's daughter verbally agreed to assistance and services provided by embedded care coordination/care management team today.  Patient verbalizes understanding of instructions provided today and agrees to view in [place].   Follow up plan: Appointment scheduled for  SW follow up with client by phone on: 08/30/2021   Casimer Lanius, [place] Management [facility] Coordination  [phone]

## 2021-08-16 NOTE — Chronic Care Management (AMB) (Signed)
    Clinical Social Work  Care Management   Phone Outreach    08/16/2021 Name: Whitney Velez MRN: [id number] DOB: 07-04-1950  Whitney Velez is a 71 y.o. year old female who is a primary care patient of Erven Colla, DO .   Reason for referral: Level of Care Concerns.    CCM LCSW reached out to patient's daughter today by phone to introduce self, assess needs and offer Care Management services and interventions.    Telephone outreach was unsuccessful. A HIPPA compliant phone message was left for the patient providing contact information and requesting a return call. Also reminded daughter of phone appointment with LCSW 8/23/  Plan:CCM LCSW will wait for return call.  Review of patient status, including review of consultants reports, relevant laboratory and other test results, and collaboration with appropriate care team members and the patient's provider was performed as part of comprehensive patient evaluation and provision of care management services.     Casimer Lanius, LCSW Care Management & Coordination  [phone]

## 2021-08-16 NOTE — Chronic Care Management (AMB) (Signed)
Chronic Care Management   Clinical Social Work Note  08/16/2021 Name: Whitney Velez MRN: [id number] DOB: 02-23-1950  Whitney Velez is a 71 y.o. year old female who is a primary care patient of Erven Colla, DO. The CCM team was consulted to assist the patient with chronic disease management and/or care coordination needs related to: Level of Care Concerns.   Engaged with patient's daughter by telephone for initial visit in response to provider referral for social work chronic care management and care coordination services.   Consent to Services:  The patient was given the following information about Chronic Care Management services today, agreed to services, and gave verbal consent: 1. CCM service includes personalized support from designated clinical staff supervised by the primary care provider, including individualized plan of care and coordination with other care providers 2. 24/7 contact phone numbers for assistance for urgent and routine care needs. 3. Service will only be billed when office clinical staff spend 20 minutes or more in a month to coordinate care. 4. Only one practitioner may furnish and bill the service in a calendar month. 5.The patient may stop CCM services at any time (effective at the end of the month) by phone call to the office staff. 6. The patient will be responsible for cost sharing (co-pay) of up to 20% of the service fee (after annual deductible is met). Patient agreed to services and consent obtained.  Patient agreed to services and consent obtained.   Consent to Services:  The patient was given information about Care Management services, agreed to services, and gave verbal consent prior to initiation of services.  Please see initial visit note for detailed documentation.   Patient agreed to services today and consent obtained.   Assessment:  Patient's daughter Whitney Velez provided all information during this encounter. They continues to experience difficulty  with receiving the level of care needed for patient. Patient currently attends day program 3 days a week. See Care Plan below for interventions and patient self-care actives.  Recent life changes or stressors: progressed with symptoms associated with dementia  Recommendation: Patient may benefit from, and daughter is in agreement explore all community options for placement and in-home care.   Follow up Plan: Patient's daughter would like continued follow-up from CCM LCSW .  per daughter's request will follow up in 2 weeks 08/30/21.  Will call office if needed prior to next encounter.   : Review of patient past medical history, allergies, medications, and health status, including review of relevant consultants reports was performed today as part of a comprehensive evaluation and provision of chronic care management and care coordination services.     SDOH (Social Determinants of Health) assessments and interventions performed:    Advanced Directives Status: See Care Plan for related entries.  CCM Care Plan  Allergies  Allergen Reactions   Penicillins Other (See Comments)    Nausea, vomiting, dizziness Has patient had a PCN reaction causing immediate rash, facial/tongue/throat swelling, SOB or lightheadedness with hypotension: no Has patient had a PCN reaction causing severe rash involving mucus membranes or skin necrosis: no Has patient had a PCN reaction that required hospitalization: no Has patient had a PCN reaction occurring within the last 10 years: no If all of the above answers are "NO", then may proceed with Cephalosporin use.    Percocet [Oxycodone-Acetaminophen] Diarrhea and Nausea And Vomiting   Ranitidine Itching    Outpatient Encounter Medications as of 08/16/2021  Medication Sig   albuterol (VENTOLIN HFA) 108 (  90 Base) MCG/ACT inhaler INHALE TWO PUFFS INTO THE LUNGS EVERY SIX HOURS AS NEEDED FOR WHEEZING   amLODipine (NORVASC) 5 MG tablet TAKE ONE (1) TABLET BY MOUTH EVERY  DAY   anastrozole (ARIMIDEX) 1 MG tablet TAKE ONE (1) TABLET BY MOUTH EVERY DAY   anastrozole (ARIMIDEX) 1 MG tablet TAKE ONE (1) TABLET BY MOUTH EVERY DAY (Patient not taking: Reported on 08/09/2021)   anastrozole (ARIMIDEX) 1 MG tablet TAKE ONE (1) TABLET BY MOUTH EVERY DAY (Patient not taking: Reported on 08/09/2021)   aspirin 81 MG tablet Take 81 mg by mouth daily.   Calcium Carb-Cholecalciferol (CALCIUM + VITAMIN D3 PO) Take 1 tablet by mouth 2 (two) times daily.   clonazePAM (KLONOPIN) 0.5 MG tablet Take 1/2 tab p.o. in am and then 1 tab p.o. qhs. prn FOR ANXIETY (MAX 2 TABLETS IN 24 HOURS) (Patient taking differently: Take 1/2 tab p.o. in am and then 1 tab p.o. qhs. prn FOR ANXIETY (MAX 2 TABLETS IN 24 HOURS)   08/09/21  Taking one tab at hs)   denosumab (PROLIA) 60 MG/ML SOSY injection Inject 60 mg into the skin every 6 (six) months.   Dextromethorphan-quiNIDine (NUEDEXTA) 20-10 MG capsule Nuedexta 20 mg-10 mg capsule  Take 1 capsule every 12 hours by oral route.  1 daily for 1 month and then increase to 1 twice a day   FLOVENT HFA 44 MCG/ACT inhaler INHALE TWO PUFFS INTO THE LUNGS IN THE MORNING AND AT BEDTIME TO PREVENT WHEEZING   hydrochlorothiazide (HYDRODIURIL) 25 MG tablet TAKE ONE TABLET (25MG TOTAL) BY MOUTH DAILY (Patient not taking: Reported on 08/09/2021)   ibuprofen (ADVIL) 200 MG tablet Take 200 mg by mouth at bedtime as needed.   midodrine (PROAMATINE) 5 MG tablet Take 10 mg by mouth 3 (three) times daily with meals.   montelukast (SINGULAIR) 10 MG tablet TAKE ONE TABLET (10MG TOTAL) BY MOUTH ATBEDTIME   OVER THE COUNTER MEDICATION Stool softner once daily   potassium chloride SA (KLOR-CON) 20 MEQ tablet TAKE ONE TABLET (20MEQ TOTAL) BY MOUTH TWO TIMES DAILY   sertraline (ZOLOFT) 100 MG tablet Take 150 mg by mouth daily.   No facility-administered encounter medications on file as of 08/16/2021.    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   Allergic rhinitis 02/20/2015   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[place]) 02/20/2015   Essential hypertension, benign 12/16/2013   Type 2 diabetes mellitus ([place]) 12/16/2013    Conditions to be addressed/monitored: Dementia; Level of care concerns  Care Plan : General Social Work (Adult)  Updates made by Maurine Cane, LCSW since 08/16/2021 12:00 AM     Problem: Long-Term Care Planning      Goal: Explore community optionns for long-term care   Start Date: 08/16/2021  This Visit's Progress: On track  Priority: High  Note:   Current barriers:   Unable to consistently perform activities of daily living and needs additional assistance in order to meet this unmet need Unable  to  independently self manage needs related to chronic health conditions.  Clinical Goals: patient's daughter will work with SW to address concerns related to long-term care planning Interventions : 1:1 collaboration with primary care provider regarding development and update of comprehensive plan of care as evidenced by provider attestation and co-signature Inter-disciplinary care team collaboration (see longitudinal plan of care) Assessed needs, level of care concerns, what patient is currently doing to meet need and support system. Review various resources, discussed options,  basic eligibility and provided patient information about:Department of Social Services ( Medicaid ), PACE Program, [employer] Alternative Program CAP (application  e-mailed), Private pay options home health needs, and Dementia resources and support  Solution-Focused Strategies, Active listening / Reflection utilized , Emotional Supportive Provided, Problem Solving [facility] , and Caregiver stress acknowledged  Patient Goals/Self-Care Activities: Over the next 14 days Call Department of Social Services to follow up on the [employer] application  Call PACE program to see if this is an option  Call [place] care to follow up on caregiver resources and support I will coordinate with your PCP to assist with an FL2 when needed  I e-mailed you a CAPS application     Problem: No Advance Directive      Goal: Effective Long-Term Care Planning with Advance Directive in place   Start Date: 08/16/2021  This Visit's Progress: On track  Priority: High  Note:   Current barriers:   Patient does not have an advance directive.  Needs education, support and coordination in order to meet this need. Clinical Goal(s): Over the next 30 days, the patient and daughter will review information on advance directive, complete advance directive packet and have notarized.  Interventions: 1:1 collaboration with primary care provider regarding development and update of comprehensive plan of care as evidenced by provider attestation and co-signature Inter-disciplinary care team collaboration (see longitudinal plan of care) Assessed understanding of Advance Directives with Daughter; Informed daughter to discuss capacity with PCP A voluntary discussion about advanced care planning including importance of advanced directives, healthcare proxy and living will was discussed with the patient's daughter unable to talk with patient.  Educational information on Advance Directives as well as an advance directive packet e-mailed.  Patient Goals/Self-Care Activities : Over the next 30 days Review educational information on Advance Directive  Complete Advance Directive packet,  Discuss with PCP  capacity to appoint daughter as HPOA Have advance directive notarized and provide a copy to provider office     Casimer Lanius, [place] for Eduard Clos [phone] 4:25 PM

## 2021-08-17 ENCOUNTER — Telehealth: Payer: Self-pay | Admitting: Family Medicine

## 2021-08-17 ENCOUNTER — Other Ambulatory Visit (HOSPITAL_COMMUNITY): Payer: Self-pay | Admitting: *Deleted

## 2021-08-17 DIAGNOSIS — E876 Hypokalemia: Secondary | ICD-10-CM

## 2021-08-17 MED ORDER — POTASSIUM CHLORIDE ER 10 MEQ PO TBCR: 20.0000 meq | EXTENDED_RELEASE_TABLET | Freq: Every day | ORAL | 3 refills | Status: DC

## 2021-08-17 NOTE — Telephone Encounter (Signed)
Yes pls give wheel chair due to gait instability, falls.   Thanks,   Dr. Lovena Le

## 2021-08-17 NOTE — Telephone Encounter (Signed)
Daughter(Angela) is requesting a prescription for patient to get a wheel chair. [employer]

## 2021-08-18 NOTE — Telephone Encounter (Signed)
Daughter(DPR)  notified 

## 2021-08-18 NOTE — Telephone Encounter (Signed)
Prescription faxed to pharmacy.

## 2021-08-22 ENCOUNTER — Telehealth: Payer: Medicare Other

## 2021-08-28 DIAGNOSIS — Z79899 Other long term (current) drug therapy: Secondary | ICD-10-CM | POA: Diagnosis not present

## 2021-08-28 DIAGNOSIS — R296 Repeated falls: Secondary | ICD-10-CM | POA: Diagnosis not present

## 2021-08-28 DIAGNOSIS — F482 Pseudobulbar affect: Secondary | ICD-10-CM | POA: Diagnosis not present

## 2021-08-28 DIAGNOSIS — I951 Orthostatic hypotension: Secondary | ICD-10-CM | POA: Diagnosis not present

## 2021-08-28 DIAGNOSIS — G309 Alzheimer's disease, unspecified: Secondary | ICD-10-CM | POA: Diagnosis not present

## 2021-08-28 DIAGNOSIS — R2689 Other abnormalities of gait and mobility: Secondary | ICD-10-CM | POA: Diagnosis not present

## 2021-08-29 ENCOUNTER — Telehealth: Payer: Medicare Other

## 2021-08-30 ENCOUNTER — Telehealth: Payer: Medicare Other

## 2021-08-30 ENCOUNTER — Ambulatory Visit: Payer: Medicare Other | Admitting: *Deleted

## 2021-08-30 DIAGNOSIS — F0390 Unspecified dementia without behavioral disturbance: Secondary | ICD-10-CM

## 2021-08-30 DIAGNOSIS — R531 Weakness: Secondary | ICD-10-CM

## 2021-08-30 DIAGNOSIS — R413 Other amnesia: Secondary | ICD-10-CM

## 2021-08-30 DIAGNOSIS — E119 Type 2 diabetes mellitus without complications: Secondary | ICD-10-CM

## 2021-08-30 DIAGNOSIS — I10 Essential (primary) hypertension: Secondary | ICD-10-CM

## 2021-08-31 NOTE — Chronic Care Management (AMB) (Signed)
Chronic Care Management    Clinical Social Work Note  08/31/2021 Name: Whitney Velez MRN: [id number] DOB: 10/17/1950  Whitney Velez is a 71 y.o. year old female who is a primary care patient of Erven Colla, DO. The CCM team was consulted to assist the patient with chronic disease management and/or care coordination needs related to: [employer] , Level of Care Concerns, Advanced Directive Education, and Caregiver Stress.   Engaged with patient by telephone for follow up visit in response to provider referral for social work chronic care management and care coordination services.   Consent to Services:  The patient was given information about Chronic Care Management services, agreed to services, and gave verbal consent prior to initiation of services.  Please see initial visit note for detailed documentation.   Patient agreed to services and consent obtained.   Assessment: Review of patient past medical history, allergies, medications, and health status, including review of relevant consultants reports was performed today as part of a comprehensive evaluation and provision of chronic care management and care coordination services.     SDOH (Social Determinants of Health) assessments and interventions performed:    Advanced Directives Status: See Care Plan for related entries.  CCM Care Plan  Allergies  Allergen Reactions   Penicillins Other (See Comments)    Nausea, vomiting, dizziness Has patient had a PCN reaction causing immediate rash, facial/tongue/throat swelling, SOB or lightheadedness with hypotension: no Has patient had a PCN reaction causing severe rash involving mucus membranes or skin necrosis: no Has patient had a PCN reaction that required hospitalization: no Has patient had a PCN reaction occurring within the last 10 years: no If all of the above answers are "NO", then may proceed with Cephalosporin use.    Percocet [Oxycodone-Acetaminophen] Diarrhea and  Nausea And Vomiting   Ranitidine Itching    Outpatient Encounter Medications as of 08/30/2021  Medication Sig   albuterol (VENTOLIN HFA) 108 (90 Base) MCG/ACT inhaler INHALE TWO PUFFS INTO THE LUNGS EVERY SIX HOURS AS NEEDED FOR WHEEZING   amLODipine (NORVASC) 5 MG tablet TAKE ONE (1) TABLET BY MOUTH EVERY DAY   anastrozole (ARIMIDEX) 1 MG tablet TAKE ONE (1) TABLET BY MOUTH EVERY DAY   anastrozole (ARIMIDEX) 1 MG tablet TAKE ONE (1) TABLET BY MOUTH EVERY DAY (Patient not taking: Reported on 08/09/2021)   anastrozole (ARIMIDEX) 1 MG tablet TAKE ONE (1) TABLET BY MOUTH EVERY DAY (Patient not taking: Reported on 08/09/2021)   aspirin 81 MG tablet Take 81 mg by mouth daily.   Calcium Carb-Cholecalciferol (CALCIUM + VITAMIN D3 PO) Take 1 tablet by mouth 2 (two) times daily.   clonazePAM (KLONOPIN) 0.5 MG tablet Take 1/2 tab p.o. in am and then 1 tab p.o. qhs. prn FOR ANXIETY (MAX 2 TABLETS IN 24 HOURS) (Patient taking differently: Take 1/2 tab p.o. in am and then 1 tab p.o. qhs. prn FOR ANXIETY (MAX 2 TABLETS IN 24 HOURS)   08/09/21  Taking one tab at hs)   denosumab (PROLIA) 60 MG/ML SOSY injection Inject 60 mg into the skin every 6 (six) months.   Dextromethorphan-quiNIDine (NUEDEXTA) 20-10 MG capsule Nuedexta 20 mg-10 mg capsule  Take 1 capsule every 12 hours by oral route.  1 daily for 1 month and then increase to 1 twice a day   FLOVENT HFA 44 MCG/ACT inhaler INHALE TWO PUFFS INTO THE LUNGS IN THE MORNING AND AT BEDTIME TO PREVENT WHEEZING   hydrochlorothiazide (HYDRODIURIL) 25 MG tablet TAKE ONE TABLET (  $'25MG'u$  TOTAL) BY MOUTH DAILY (Patient not taking: Reported on 08/09/2021)   ibuprofen (ADVIL) 200 MG tablet Take 200 mg by mouth at bedtime as needed.   midodrine (PROAMATINE) 5 MG tablet Take 10 mg by mouth 3 (three) times daily with meals.   montelukast (SINGULAIR) 10 MG tablet TAKE ONE TABLET ('10MG'$  TOTAL) BY MOUTH ATBEDTIME   OVER THE COUNTER MEDICATION Stool softner once daily   potassium  chloride (KLOR-CON) 10 MEQ tablet Take 2 tablets (20 mEq total) by mouth daily.   sertraline (ZOLOFT) 100 MG tablet Take 150 mg by mouth daily.   No facility-administered encounter medications on file as of 08/30/2021.    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/2015   Essential hypertension, benign 12/16/2013   Type 2 diabetes mellitus ([place]) 12/16/2013    Conditions to be addressed/monitored: DMII and Dementia; Financial constraints related to limited income, Limited social support, Level of care concerns, and Limited access to caregiver  Care Plan : General Social Work (Adult)  Updates made by Deirdre Peer, LCSW since 08/31/2021 12:00 AM     Problem: Long-Term Care Planning      Goal: Explore community optionns for long-term care   Start Date: 08/16/2021  Expected End Date: 10/30/2021  This Visit's Progress: On track  Recent Progress: On track  Priority: High  Note:   Current barriers:   Unable to consistently perform activities of daily living and needs additional assistance in order to meet this unmet need Unable to  independently self manage needs related to chronic health conditions.  Clinical Goals: patient's daughter will work with SW to address concerns related to long-term care planning Interventions : CSW spoke with daughter by phone who reports multiple call attempts to DSS  for MEdicaid question- CSW offerd for daughter to email info for CSW to review and attempt to help.  She has submitted application for CAPS and awaits follow up. Daughter also reports she plans to take the Advance Directive packet to PCP visit for assistance/guidance with pt's completion. She has not yet toured [facility] but plans to consider soon. Pt continues to attend [facility] 2 days per week which is helpful.  Daughter is hoping her brother will attend the PCP visit also as she is wanting to include him with more awareness and understandings. She is holding off on Palliative Care/Hospice input right now for sensitivity of her brother(s) "not ready".   1:1 collaboration with primary care provider regarding development and update of comprehensive plan of care as evidenced by provider attestation and co-signature Inter-disciplinary care team collaboration (see longitudinal plan of care) Assessed needs, level of care concerns, what patient is currently doing to meet need and support system. Review various resources, discussed options,  basic eligibility and provided patient information about:Department of Social Services ( Medicaid ), [facility] Program, Community Alternative Program CAP, Private pay options home health needs, and Dementia resources  and support  Solution-Focused Strategies, Active listening / Reflection utilized , Emotional Supportive Provided, Problem Solving /Task Center , and Caregiver stress acknowledged  Patient Goals/Self-Care Activities: Over the next 14 days Call Department of Social Services to follow up on the [employer] application  (email info to me to assist as discussed) Call [facility] program to see if this is an option  I will coordinate with your PCP to assist with an FL2 when needed          Follow Up Plan: SW will follow up with patient by phone over the next 3-4 weeks      Eduard Clos MSW, [place] Licensed Clinical Social Worker [place] [phone]

## 2021-09-08 ENCOUNTER — Other Ambulatory Visit: Payer: Self-pay | Admitting: Family Medicine

## 2021-09-11 ENCOUNTER — Encounter: Payer: Self-pay | Admitting: Family Medicine

## 2021-09-11 ENCOUNTER — Ambulatory Visit (INDEPENDENT_AMBULATORY_CARE_PROVIDER_SITE_OTHER): Payer: Medicare Other | Admitting: Family Medicine

## 2021-09-11 ENCOUNTER — Other Ambulatory Visit: Payer: Self-pay

## 2021-09-11 VITALS — BP 136/86 | HR 68 | Ht 60.0 in

## 2021-09-11 DIAGNOSIS — F0390 Unspecified dementia without behavioral disturbance: Secondary | ICD-10-CM

## 2021-09-11 DIAGNOSIS — R634 Abnormal weight loss: Secondary | ICD-10-CM

## 2021-09-11 DIAGNOSIS — R296 Repeated falls: Secondary | ICD-10-CM | POA: Diagnosis not present

## 2021-09-11 DIAGNOSIS — R410 Disorientation, unspecified: Secondary | ICD-10-CM | POA: Diagnosis not present

## 2021-09-11 DIAGNOSIS — R531 Weakness: Secondary | ICD-10-CM | POA: Diagnosis not present

## 2021-09-11 MED ORDER — AMLODIPINE BESYLATE 5 MG PO TABS: 5.0000 mg | ORAL_TABLET | Freq: Every day | ORAL | 0 refills | Status: DC

## 2021-09-11 MED ORDER — CLONAZEPAM 0.5 MG PO TABS: ORAL_TABLET | ORAL | 0 refills | Status: DC

## 2021-09-11 NOTE — Progress Notes (Addendum)
Patient ID: Whitney Velez, female    DOB: 1950-06-06, 71 y.o.   MRN: [id number]   Chief Complaint  Patient presents with   Dementia    Follow up- some days not eating or sleeping good Also not moving around good- interested in getting her a wheelchair and hospital bed   Subjective:    HPI F/u dementia and falls. Pt seen today with caregiver- Daughter.  Having some falls intermittently still after having PT and neurologist was wondering if she has orthostatic hypotension.  Was on midodrine but discontinued.   Did PT for 4 wks. And improved with walking, then the next week had another fall at the memory center. Then fell again at the center due to the fall.  Going to day center for memory, and in wheelchair full time when at center. Only going 2 days per week to center now.  Restarted aricept also 2 wks ago. And on nudexta, for neuropsychiatric issues with dementia. Seeing neuro, Dr. Merlene Laughter. Taking 1 tab 0.'5mg'$  clonazepam at night. - to bed by 8-8:30pm. Sleep by 9-10pm. 4am she's trying to get up to bathroom.  Not going back to sleep after 4am. Has a bed with rails to help keep her in bed. Zoloft still '150mg'$ .  If not taking clonazepam then not getting sleep at all, getting up a lot at night.   Not seeing a lot of improvement when taking aricept.  Decrease in eating- Not swallowing well and not eating much.  Eating fish and chicken and likes this the best.  Ensure in am and pm. Not snacking.  Hypokalemia- Switched the potassium pills by oncology to get smaller tab for swallowing.  Hard to swallow the bigger pills.  Doing better with this.  Pt got TED hose to help with blood pressures.  Pt daughter/caregiver is requesting a wheelchair- Needing standard wheelchair and hospital bed for safety and multiple falls and getting up from bed and falling at night.  Needing wheel chair to take her out of house and to memory center, since pt is having falls still with the rolling  walker.   Pt requiring this for helping with transferring to toileting/bathing/dressing, for mobility in the house/center.  Not able to use cane/walker due to falling backwards/side ways.   Use to the wheelchair would improve the pt ability to participate in ADLS and will use it daily. The pt and family are willing to use the wheelchair daily.  Pt has a caregiver to help and is willing and able to help pt with assistance with wheelchair.   Daughter  requesting a new hospital bed- The patient requires pt to have railing and foot board to keep pt in bed during the night.    Due to pt generalized weakness needs help with elevation/lowering of bed with getting up.  Patient does require needing a bed with rails for safety to keep pt from falling in the night due to patient's worsening dementia and frequent falls.  Medical History Synai has a past medical history of Asthma and Hypertension.   Outpatient Encounter Medications as of 09/11/2021  Medication Sig   albuterol (VENTOLIN HFA) 108 (90 Base) MCG/ACT inhaler INHALE TWO PUFFS INTO THE LUNGS EVERY SIX HOURS AS NEEDED FOR WHEEZING   amLODipine (NORVASC) 5 MG tablet Take 1 tablet (5 mg total) by mouth daily.   anastrozole (ARIMIDEX) 1 MG tablet TAKE ONE (1) TABLET BY MOUTH EVERY DAY   aspirin 81 MG tablet Take 81 mg by mouth daily.  Calcium Carb-Cholecalciferol (CALCIUM + VITAMIN D3 PO) Take 1 tablet by mouth 2 (two) times daily.   clonazePAM (KLONOPIN) 0.5 MG tablet Take  1 tab p.o. qhs. prn FOR ANXIETY   denosumab (PROLIA) 60 MG/ML SOSY injection Inject 60 mg into the skin every 6 (six) months.   Dextromethorphan-quiNIDine (NUEDEXTA) 20-10 MG capsule Nuedexta 20 mg-10 mg capsule  Take 1 capsule every 12 hours by oral route.  1 daily for 1 month and then increase to 1 twice a day   FLOVENT HFA 44 MCG/ACT inhaler INHALE TWO PUFFS INTO THE LUNGS IN THE MORNING AND AT BEDTIME TO PREVENT WHEEZING   ibuprofen (ADVIL) 200 MG tablet Take 200 mg by  mouth at bedtime as needed.   montelukast (SINGULAIR) 10 MG tablet TAKE ONE TABLET ('10MG'$  TOTAL) BY MOUTH ATBEDTIME   OVER THE COUNTER MEDICATION Stool softner once daily   potassium chloride (KLOR-CON) 10 MEQ tablet Take 2 tablets (20 mEq total) by mouth daily.   sertraline (ZOLOFT) 100 MG tablet Take 150 mg by mouth daily.   [DISCONTINUED] amLODipine (NORVASC) 5 MG tablet TAKE ONE (1) TABLET BY MOUTH EVERY DAY   [DISCONTINUED] anastrozole (ARIMIDEX) 1 MG tablet TAKE ONE (1) TABLET BY MOUTH EVERY DAY (Patient not taking: Reported on 08/09/2021)   [DISCONTINUED] anastrozole (ARIMIDEX) 1 MG tablet TAKE ONE (1) TABLET BY MOUTH EVERY DAY (Patient not taking: Reported on 08/09/2021)   [DISCONTINUED] clonazePAM (KLONOPIN) 0.5 MG tablet Take 1/2 tab p.o. in am and then 1 tab p.o. qhs. prn FOR ANXIETY (MAX 2 TABLETS IN 24 HOURS) (Patient taking differently: Take 1/2 tab p.o. in am and then 1 tab p.o. qhs. prn FOR ANXIETY (MAX 2 TABLETS IN 24 HOURS)   08/09/21  Taking one tab at hs)   [DISCONTINUED] hydrochlorothiazide (HYDRODIURIL) 25 MG tablet TAKE ONE TABLET ('25MG'$  TOTAL) BY MOUTH DAILY (Patient not taking: Reported on 08/09/2021)   [DISCONTINUED] midodrine (PROAMATINE) 5 MG tablet Take 10 mg by mouth 3 (three) times daily with meals.   No facility-administered encounter medications on file as of 09/11/2021.     Review of Systems  Constitutional:  Negative for chills and fever.  HENT:  Negative for congestion, rhinorrhea and sore throat.   Respiratory:  Negative for cough, shortness of breath and wheezing.   Cardiovascular:  Negative for chest pain and leg swelling.  Gastrointestinal:  Negative for abdominal pain, diarrhea, nausea and vomiting.  Genitourinary:  Negative for dysuria and frequency.  Musculoskeletal:  Negative for arthralgias and back pain.  Skin:  Negative for rash.  Neurological:  Negative for dizziness, weakness and headaches.  Psychiatric/Behavioral:  Positive for confusion  (intermittent) and sleep disturbance. Negative for dysphoric mood, self-injury and suicidal ideas. The patient is not nervous/anxious.        +memory loss    Vitals BP 136/86   Pulse 68   Ht 5' (1.524 m)   BMI 32.22 kg/m   Objective:   Physical Exam Vitals and nursing note reviewed.  Constitutional:      General: She is not in acute distress.    Appearance: Normal appearance. She is not ill-appearing.  HENT:     Head: Normocephalic and atraumatic.  Cardiovascular:     Rate and Rhythm: Normal rate and regular rhythm.     Pulses: Normal pulses.     Heart sounds: Normal heart sounds.  Pulmonary:     Effort: Pulmonary effort is normal. No respiratory distress.     Breath sounds: Normal breath sounds. No wheezing,  rhonchi or rales.  Musculoskeletal:        General: Normal range of motion.     Right lower leg: No edema.     Left lower leg: No edema.     Comments: +sitting in seated rolling walker.  Skin:    General: Skin is warm and dry.     Findings: No lesion or rash.  Neurological:     General: No focal deficit present.     Mental Status: She is alert. Mental status is at baseline.     Cranial Nerves: No cranial nerve deficit.     Comments: Oriented to person  Psychiatric:        Behavior: Behavior normal.     Comments: Flat affect     Assessment and Plan   1. Dementia without behavioral disturbance, unspecified dementia type (HCC) - clonazePAM (KLONOPIN) 0.5 MG tablet; Take  1 tab p.o. qhs. prn FOR ANXIETY  Dispense: 30 tablet; Refill: 0  2. Falls frequently  3. Confusion  4. Generalized weakness  5. Weight loss    Wt Readings from Last 3 Encounters:  07/08/21 165 lb (74.8 kg)  04/26/21 160 lb (72.6 kg)  03/07/21 164 lb 6.4 oz (74.6 kg)    Progression of dementia and decrease in eating- Weight at last Neurology visit, might have been 147 lbs per daughter. Pt not eating much.  Has been losing more weight recently. Hard to encourage her to eat.  Working  on it and increasing calories per meal.  Daughter to continue to give patient high-calorie meals and snacks.  Continue to encourage patient to eat.  To help with weight gain.  Patient having frequent falls still after having PT for 4 weeks.  Continue to monitor.  Patient requiring 24-hour supervision to avoid falls.  Patient also needing to use a wheelchair while out of the house and going to the memory center, due to the frequent falls.  Also will request a hospital bed for safety and her generalized weakness.  Patient requiring guardrails and footboard to keep patient in the bed at night.  Continue follow-up with Dr. Merlene Laughter for dementia and depression concerns. - will order standard Wheelchair.  -will order standard Hospital bed.   Patient to follow-up with a new provider in November for her routine follow-up.  Daughter in agreement with plan.  Return in about 6 months (around 03/11/2022) for f/u htn, dementia.

## 2021-09-12 NOTE — Progress Notes (Signed)
[name], [name]   Patient Care Team: Erven Colla, DO as PCP - General (Family Medicine) Kassie Mends, RN as [place] Management Marcelline Deist, Ranae Pila, [place] as Social Worker  SUMMARY OF ONCOLOGIC HISTORY: Oncology History  Breast cancer, right ([place])  05/12/2018 Initial Diagnosis   Breast cancer, right ([place])   06/05/2018 - 10/24/2018 Chemotherapy   The patient had DOXOrubicin (ADRIAMYCIN) chemo injection 110 mg, 60 mg/m2 = 110 mg, Intravenous,  Once, 4 of 4 cycles Administration: 110 mg (06/05/2018), 110 mg (06/19/2018), 110 mg (07/07/2018), 110 mg (07/21/2018) palonosetron (ALOXI) injection 0.25 mg, 0.25 mg, Intravenous,  Once, 4 of 4 cycles Administration: 0.25 mg (06/05/2018), 0.25 mg (06/19/2018), 0.25 mg (07/07/2018), 0.25 mg (07/21/2018) pegfilgrastim-cbqv (UDENYCA) injection 6 mg, 6 mg, Subcutaneous, Once, 4 of 4 cycles Administration: 6 mg (06/06/2018), 6 mg (06/20/2018), 6 mg (07/08/2018), 6 mg (07/22/2018) cyclophosphamide (CYTOXAN) 1,100 mg in sodium chloride 0.9 % 250 mL chemo infusion, 600 mg/m2 = 1,100 mg, Intravenous,  Once, 4 of 4 cycles Administration: 1,100 mg (06/05/2018), 1,100 mg (06/19/2018), 1,100 mg (07/07/2018), 1,100 mg (07/21/2018) PACLitaxel (TAXOL) 144 mg in sodium chloride 0.9 % 250 mL chemo infusion (</= 63m/m2), 80 mg/m2 = 144 mg, Intravenous,  Once, 12 of 12 cycles Administration: 144 mg (08/07/2018), 144 mg (08/14/2018), 144 mg (08/21/2018), 144 mg (08/28/2018), 144 mg (09/04/2018), 144 mg (09/11/2018), 144 mg (09/18/2018), 144 mg (09/25/2018), 144 mg (10/02/2018), 144 mg (10/09/2018), 144 mg (10/17/2018), 144 mg (10/24/2018) fosaprepitant (EMEND) 150 mg, dexamethasone (DECADRON) 12 mg in sodium chloride 0.9 % 145 mL IVPB, , Intravenous,  Once, 4 of 4 cycles Administration:  (06/05/2018),  (06/19/2018),  (07/07/2018),  (07/21/2018)   for chemotherapy treatment.       CHIEF COMPLIANT: Follow-up for right breast  cancer   INTERVAL HISTORY: Ms. MARLYS SCATENAis a 71y.o. female here today for follow up of her right breast cancer. Her last visit was on 03/07/2021.   Today she reports feeling fair and she is accompanied by her daughter who is acting as historian as the patient has dementia and is not a reliable historian. Her daughter reports she is often confused, and she spends much of the time sitting down due to a series of falls. Her appetite is low, and she is not sleeping well. She has lost 18 pounds since her last visit, and she has difficulty swallowing food such as breads. She feels constantly cold.   REVIEW OF SYSTEMS:   Review of Systems  Constitutional:  Positive for appetite change (30%), fatigue (depleted) and unexpected weight change (-18 lbs).  HENT:   Positive for trouble swallowing.   Neurological:  Positive for dizziness and headaches.  Psychiatric/Behavioral:  Positive for confusion and sleep disturbance. The patient is nervous/anxious.   All other systems reviewed and are negative.  I have reviewed the past medical history, past surgical history, social history and family history with the patient and they are unchanged from previous note.   ALLERGIES:   is allergic to penicillins, percocet [oxycodone-acetaminophen], and ranitidine.   MEDICATIONS:  Current Outpatient Medications  Medication Sig Dispense Refill   amLODipine (NORVASC) 5 MG tablet Take 1 tablet (5 mg total) by mouth daily. 90 tablet 0   anastrozole (ARIMIDEX) 1 MG tablet TAKE ONE (1) TABLET BY MOUTH EVERY DAY 90 tablet 3   aspirin 81 MG tablet Take 81 mg by mouth daily.     Calcium Carb-Cholecalciferol (CALCIUM +  VITAMIN D3 PO) Take 1 tablet by mouth 2 (two) times daily.     clonazePAM (KLONOPIN) 0.5 MG tablet Take  1 tab p.o. qhs. prn FOR ANXIETY 30 tablet 0   denosumab (PROLIA) 60 MG/ML SOSY injection Inject 60 mg into the skin every 6 (six) months.     Dextromethorphan-quiNIDine (NUEDEXTA) 20-10 MG capsule  Nuedexta 20 mg-10 mg capsule  Take 1 capsule every 12 hours by oral route.  1 daily for 1 month and then increase to 1 twice a day     donepezil (ARICEPT) 5 MG tablet Take 5 mg by mouth daily.     FLOVENT HFA 44 MCG/ACT inhaler INHALE TWO PUFFS INTO THE LUNGS IN THE MORNING AND AT BEDTIME TO PREVENT WHEEZING 10.6 g 2   ibuprofen (ADVIL) 200 MG tablet Take 200 mg by mouth at bedtime as needed.     montelukast (SINGULAIR) 10 MG tablet TAKE ONE TABLET (10MG TOTAL) BY MOUTH ATBEDTIME 90 tablet 0   OVER THE COUNTER MEDICATION Stool softner once daily     potassium chloride (KLOR-CON) 10 MEQ tablet Take 2 tablets (20 mEq total) by mouth daily. 60 tablet 3   sertraline (ZOLOFT) 100 MG tablet Take 150 mg by mouth daily.     albuterol (VENTOLIN HFA) 108 (90 Base) MCG/ACT inhaler INHALE TWO PUFFS INTO THE LUNGS EVERY SIX HOURS AS NEEDED FOR WHEEZING (Patient not taking: Reported on 09/13/2021) 8.5 g 2   No current facility-administered medications for this visit.     PHYSICAL EXAMINATION: Performance status (ECOG): 1 - Symptomatic but completely ambulatory  There were no vitals filed for this visit. Wt Readings from Last 3 Encounters:  07/08/21 165 lb (74.8 kg)  04/26/21 160 lb (72.6 kg)  03/07/21 164 lb 6.4 oz (74.6 kg)   Physical Exam Vitals reviewed.  Constitutional:      Appearance: Normal appearance.  Cardiovascular:     Rate and Rhythm: Normal rate and regular rhythm.     Pulses: Normal pulses.     Heart sounds: Normal heart sounds.  Pulmonary:     Effort: Pulmonary effort is normal.     Breath sounds: Normal breath sounds.  Chest:  Breasts:    Right: Absent. Skin change: mastecomy site within normal limits.     Left: Absent. Skin change: mastectomy site within normal limits.  Lymphadenopathy:     Upper Body:     Right upper body: No supraclavicular adenopathy.     Left upper body: No supraclavicular adenopathy.  Neurological:     General: No focal deficit present.     Mental  Status: She is alert and oriented to person, place, and time.  Psychiatric:        Mood and Affect: Mood normal.        Behavior: Behavior normal.    Breast Exam Chaperone: Whitney Velez     LABORATORY DATA:  I have reviewed the data as listed CMP Latest Ref Rng & Units 09/13/2021 07/08/2021 03/07/2021  Glucose 70 - 99 mg/dL 140(H) 91 83  BUN 8 - 23 mg/dL '17 23 15  ' Creatinine 0.44 - 1.00 mg/dL 1.20(H) 1.05(H) 1.01(H)  Sodium 135 - 145 mmol/L 137 137 134(L)  Potassium 3.5 - 5.1 mmol/L 3.6 3.5 3.7  Chloride 98 - 111 mmol/L 99 101 97(L)  CO2 22 - 32 mmol/L '25 26 26  ' Calcium 8.9 - 10.3 mg/dL 10.0 10.0 10.0  Total Protein 6.5 - 8.1 g/dL 8.1 8.4(H) 8.0  Total Bilirubin 0.3 - 1.2  mg/dL 0.5 0.3 0.3  Alkaline Phos 38 - 126 U/L 86 74 73  AST 15 - 41 U/L 44(H) 43(H) 24  ALT 0 - 44 U/L 43 36 18   No results found for: CAN153 Lab Results  Component Value Date   WBC 7.4 09/13/2021   HGB 13.0 09/13/2021   HCT 40.6 09/13/2021   MCV 88.8 09/13/2021   PLT 269 09/13/2021   NEUTROABS 5.2 09/13/2021    ASSESSMENT:  1.  Locally advanced (stage IIIa, T3N1) right breast cancer: - Mammogram on 03/28/2018 was BI-RADS Category 0.  Mammogram and ultrasound on 04/22/2018 shows 5.9 x 5 x 6 cm mass extending in the upper quadrant of the right breast and right axillary lymph node measuring 2.8 x 1.6 x 1.7 cm.  Left breast has retroareolar mass.  Patient did not have mammogram in the last 20 years. - Biopsy on 04/29/2018 of the right breast mass consistent with IDC, grade 2-3, lymph node biopsy positive for metastatic disease, ER/PR positive, HER-2 negative, Ki-67 of 5% -Seen by Dr. Arnoldo Morale who recommended right modified radical mastectomy and left simple mastectomy. - Bone scan showed  increased uptake in the right greater trochanter and right sacroiliac joint.  CT scan of the chest abdomen and pelvis did not show any evidence of metastatic disease.  The uptake in the right greater trochanter was thought to be due  to enthesopathic change.  Uptake in the right sacroiliac joint was likely from degenerative changes at the right L5-S1 joint. - Due to advanced nature of her breast cancer, we have recommended neoadjuvant chemotherapy with dose dense AC followed by weekly paclitaxel for 12 weeks. - 4 cycles of dose dense AC from 06/05/2018 through 07/21/2018. - 12 cycles of weekly paclitaxel from 08/07/2018 through 10/23/2018.  Weekly paclitaxel started on 08/07/2018.  - Last physical examination revealed 4 to 5 cm mass in the right breast, softer, freely mobile.  Very small lymph node palpable in the right axilla.  Left breast has nonspecific mass in the center. -Anastrozole started on 11/07/2018. - Right modified radical mastectomy on 12/01/2018 with left simple mastectomy. 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 -We have called pathology and requested ER/PR and HER-2/neu staining on the mastectomy.  This will be reported in 2 to 3 days. -She is complaining of weakness in her legs.  We will order physical therapy. - I have recommended doing a whole-body PET CT scan.  We will see her back in 3 to 4 weeks.   2.  Left breast DCIS: -Biopsy on 04/29/2018 of the left breast shows DCIS, ER/PR positive.  Left axillary lymph node biopsy was negative for malignancy. -Left simple mastectomy on 12/01/2018 shows 7 cm DCIS, intermediate grade, with cribriform, papillary and cystic morphology.  No evidence of invasion.   3.  Osteopenia: - DEXA scan on 11/13/2018 shows T score of -0.8.  She is taking calcium and vitamin D.   PLAN:  1.  Locally advanced (stage IIIa, T3N1) right breast cancer: - She is tolerating anastrozole very well. - Bilateral mastectomy sites are within normal limits. - Reviewed labs from 09/13/2021 which  showed slightly elevated AST and rest of the LFTs are normal.  CBC was grossly normal.  Q82 and folic acid was normal. - RTC 6 months for follow-up.  2.  Hypokalemia: - Potassium is normal today.  Continue potassium supplements.   3.  Osteopenia: - Calcium is normal.  She will continue Prolia today.  Continue home calcium supplements.   4.  Dementia: - She was diagnosed with vascular dementia and being managed by Dr. Merlene Laughter. - Daughter reports that she is sometimes does not remember her and her grandchildren.  She thinks she is still working. - Aricept was started.  Dr. Merlene Laughter will evaluate her soon. - If there is any severe deterioration in her dementia, will consider discontinuing her cancer treatments.  Breast Cancer therapy associated bone loss: I have recommended calcium, Vitamin D and weight bearing exercises.  Orders placed this encounter:  No orders of the defined types were placed in this encounter.   The patient has a good understanding of the overall plan. She agrees with it. She will call with any problems that may develop before the next visit here.  Derek Jack, MD [place] [phone]   I, Whitney Velez, am acting as a scribe for Dr. Derek Jack.  I, Derek Jack MD, have reviewed the above documentation for accuracy and completeness, and I agree with the above.

## 2021-09-13 ENCOUNTER — Inpatient Hospital Stay (HOSPITAL_COMMUNITY): Payer: Medicare Other

## 2021-09-13 ENCOUNTER — Other Ambulatory Visit: Payer: Self-pay

## 2021-09-13 ENCOUNTER — Inpatient Hospital Stay (HOSPITAL_COMMUNITY): Payer: Medicare Other | Admitting: Hematology

## 2021-09-13 ENCOUNTER — Ambulatory Visit (INDEPENDENT_AMBULATORY_CARE_PROVIDER_SITE_OTHER): Payer: Medicare Other | Admitting: *Deleted

## 2021-09-13 ENCOUNTER — Inpatient Hospital Stay (HOSPITAL_COMMUNITY): Payer: Medicare Other | Attending: Hematology

## 2021-09-13 DIAGNOSIS — Z79899 Other long term (current) drug therapy: Secondary | ICD-10-CM | POA: Diagnosis not present

## 2021-09-13 DIAGNOSIS — Z170 Estrogen receptor positive status [ER+]: Secondary | ICD-10-CM

## 2021-09-13 DIAGNOSIS — C50911 Malignant neoplasm of unspecified site of right female breast: Secondary | ICD-10-CM | POA: Diagnosis not present

## 2021-09-13 DIAGNOSIS — Z79811 Long term (current) use of aromatase inhibitors: Secondary | ICD-10-CM | POA: Diagnosis not present

## 2021-09-13 DIAGNOSIS — C50811 Malignant neoplasm of overlapping sites of right female breast: Secondary | ICD-10-CM | POA: Diagnosis not present

## 2021-09-13 DIAGNOSIS — E876 Hypokalemia: Secondary | ICD-10-CM | POA: Diagnosis not present

## 2021-09-13 DIAGNOSIS — Z9013 Acquired absence of bilateral breasts and nipples: Secondary | ICD-10-CM | POA: Insufficient documentation

## 2021-09-13 DIAGNOSIS — Z7982 Long term (current) use of aspirin: Secondary | ICD-10-CM | POA: Diagnosis not present

## 2021-09-13 DIAGNOSIS — Z7951 Long term (current) use of inhaled steroids: Secondary | ICD-10-CM | POA: Insufficient documentation

## 2021-09-13 DIAGNOSIS — M8580 Other specified disorders of bone density and structure, unspecified site: Secondary | ICD-10-CM | POA: Diagnosis not present

## 2021-09-13 LAB — CBC WITH DIFFERENTIAL/PLATELET
Abs Immature Granulocytes: 0.03 10*3/uL (ref 0.00–0.07)
Basophils Absolute: 0.1 10*3/uL (ref 0.0–0.1)
Basophils Relative: 1 %
Eosinophils Absolute: 0.6 10*3/uL — ABNORMAL HIGH (ref 0.0–0.5)
Eosinophils Relative: 8 %
HCT: 40.6 % (ref 36.0–46.0)
Hemoglobin: 13 g/dL (ref 12.0–15.0)
Immature Granulocytes: 0 %
Lymphocytes Relative: 14 %
Lymphs Abs: 1.1 10*3/uL (ref 0.7–4.0)
MCH: 28.4 pg (ref 26.0–34.0)
MCHC: 32 g/dL (ref 30.0–36.0)
MCV: 88.8 fL (ref 80.0–100.0)
Monocytes Absolute: 0.5 10*3/uL (ref 0.1–1.0)
Monocytes Relative: 7 %
Neutro Abs: 5.2 10*3/uL (ref 1.7–7.7)
Neutrophils Relative %: 70 %
Platelets: 269 10*3/uL (ref 150–400)
RBC: 4.57 MIL/uL (ref 3.87–5.11)
RDW: 15.9 % — ABNORMAL HIGH (ref 11.5–15.5)
WBC: 7.4 10*3/uL (ref 4.0–10.5)
nRBC: 0 % (ref 0.0–0.2)

## 2021-09-13 LAB — COMPREHENSIVE METABOLIC PANEL
ALT: 43 U/L (ref 0–44)
AST: 44 U/L — ABNORMAL HIGH (ref 15–41)
Albumin: 3.9 g/dL (ref 3.5–5.0)
Alkaline Phosphatase: 86 U/L (ref 38–126)
Anion gap: 13 (ref 5–15)
BUN: 17 mg/dL (ref 8–23)
CO2: 25 mmol/L (ref 22–32)
Calcium: 10 mg/dL (ref 8.9–10.3)
Chloride: 99 mmol/L (ref 98–111)
Creatinine, Ser: 1.2 mg/dL — ABNORMAL HIGH (ref 0.44–1.00)
GFR, Estimated: 48 mL/min — ABNORMAL LOW (ref >60)
Glucose, Bld: 140 mg/dL — ABNORMAL HIGH (ref 70–99)
Potassium: 3.6 mmol/L (ref 3.5–5.1)
Sodium: 137 mmol/L (ref 135–145)
Total Bilirubin: 0.5 mg/dL (ref 0.3–1.2)
Total Protein: 8.1 g/dL (ref 6.5–8.1)

## 2021-09-13 LAB — FOLATE: Folate: 10.6 ng/mL (ref >5.9)

## 2021-09-13 LAB — LACTATE DEHYDROGENASE: LDH: 148 U/L (ref 98–192)

## 2021-09-13 LAB — VITAMIN D 25 HYDROXY (VIT D DEFICIENCY, FRACTURES): Vit D, 25-Hydroxy: 28.33 ng/mL — ABNORMAL LOW (ref 30–100)

## 2021-09-13 LAB — VITAMIN B12: Vitamin B-12: 549 pg/mL (ref 180–914)

## 2021-09-13 MED ORDER — DENOSUMAB 60 MG/ML ~~LOC~~ SOSY
60.0000 mg | PREFILLED_SYRINGE | Freq: Once | SUBCUTANEOUS | Status: AC
  Administered 2021-09-13: 60 mg via SUBCUTANEOUS

## 2021-09-13 NOTE — Patient Instructions (Signed)
Visit Information  PATIENT GOALS:  Goals Addressed             This Visit's Progress    Develop long range plan for mangement of dementia       Timeframe:  Long-Range Goal Priority:  High Start Date:       08/09/2021                      Expected End Date:     02/09/2022                  Follow Up Date 10/25/2021   - continue stay in touch with my family and friends as much as possible - keep pathways clear to prevent falls, stay well hydrated and do not ambulate if dizzy - take all medications as prescribed - continue to work with Education officer, museum for resources/ guidance - attend caregiver dementia support group - call your doctor for any new questions or concerns - wear compression hose daily and take off at night - per reinforcement of provider- give high calorie meals and snacks   Why is this important?   As we age, or sometimes because we have an illness, it feels like our memory and ability to figure things out is not very good.  There are things you can do to keep your memory and your thinking as strong as possible.    Notes:      Track and Manage My Blood Pressure-Hypertension       Timeframe:  Long-Range Goal Priority:  Medium Start Date:   08/09/2021                          Expected End Date:   02/09/2022                    Follow Up Date 10/25/2021   - check blood pressure 3 times per week and record in a log - choose a place to take my blood pressure (home, clinic or office, retail store) - follow low salt diet- avoid salty snacks and fast food - continue doing any exercises prescribed by physical therapist     Why is this important?   You won't feel high blood pressure, but it can still hurt your blood vessels.  High blood pressure can cause heart or kidney problems. It can also cause a stroke.  Making lifestyle changes like losing a little weight or eating less salt will help.  Checking your blood pressure at home and at different times of the day can help  to control blood pressure.  If the doctor prescribes medicine remember to take it the way the doctor ordered.  Call the office if you cannot afford the medicine or if there are questions about it.     Notes:         Patient verbalizes understanding of instructions provided today and agrees to view in [place].   Telephone follow up appointment with care management team member scheduled for:   10/25/2021  Fall Prevention in Hospitals, Adult Being a patient in the hospital puts you at risk for falling. Falls can cause serious injury and harm, but they can be prevented. It is important to understand what puts you at risk for falling and what you and your health care team can do to prevent you from falling. If you or a loved one falls at the hospital, it is important to  tell hospital staff about it. What increases the risk for falls? Certain conditions and treatments may increase your risk of falling in the hospital. These include: Being in an unfamiliar environment, especially when using the bathroom at night. Having surgery. Being on bed rest. Taking many medicines or certain types of medicines, such as sleeping pills. Having tubes in place, such as IV lines or catheters. Other risk factors for falls in a hospital include: Having difficulty with hearing or vision. Having a change in thinking or behavior, such as confusion. Having depression. Having trouble with balance. Being a female. Feeling dizzy. Needing to use the toilet frequently. Having fallen during the past three months. Having low blood pressure. What are some strategies for preventing falls? If you or a loved one has to stay in the hospital: Ask about which fall prevention strategies will be in place. Do not hesitate to speak up if you notice that the fall prevention plan has changed. Ask for help moving around, especially after surgery or when feeling unwell. If you have been asked to call for help when getting up, do not  get up by yourself. Asking for help with getting up is for your safety, and the staff is there to help you. Wear nonskid footwear. Get up slowly, and sit at the side of the bed for a few minutes before standing up. Keep items you need, such as the nurse call button or a phone, close to you so that you do not need to reach for them. Wear eyeglasses or hearing aids if you have them. Have someone stay in the hospital with you or your loved one. Ask if sleeping pills or other medicines that can cause confusion are necessary. What does the hospital staff do to help prevent falls? Hospitals have systems in place to prevent falls and accidents, which may involve: Discussing your fall risks and making a personalized fall prevention plan. Checking in regularly to see if you need help. Placing an arm band on your wrist or a sign near your room to alert other staff of your needs. Using an alarm on your hospital bed. This is an alarm that goes off if you get out of bed and forget to call for help. Keeping the bed in a low and locked position. Keeping the area around the bed and bathroom well-lit and free from clutter. Keeping your room quiet, so that you can sleep and be well-rested. Using safety equipment, such as: A belt around your waist. Walkers, crutches, and other devices for support. Safety beds, such as low beds or cushions on the floor next to the bed. Having a staff person stay with you (one-on-one observation), even when you are using the bathroom. This is for your safety. Using video monitoring. This allows a staff member to come to help you if you need help. What other actions can I take to lower my risk of falls? Check in regularly with your health care provider or pharmacist to review all of the medicines that you take. Make sure that you have a regular exercise program to stay fit. This will help you maintain your balance. Talk with a physical therapist or trainer if recommended by your  health care provider. They can help you to improve your strength, balance, and endurance. If you are over age 9: Ask your health care provider if you need a calcium or vitamin D supplement. Have your eyes and hearing checked every year. Have your feet checked every year. Where to find  more information You can find more information about fall prevention from the Centers for Disease Control and Prevention: ImproveLook.cz Summary Being in an unfamiliar environment, such as the hospital, increases your risk for falling. If you have been asked to call for help when getting up, do not get up by yourself. Asking for help with getting up is for your safety, and the staff is there to help you. Ask about which fall prevention strategies will be in place. Do not hesitate to speak up if you notice that the fall prevention plan has changed. If you or a loved one falls, tell the hospital staff. This is important. This information is not intended to replace advice given to you by your health care provider. Make sure you discuss any questions you have with your health care provider. Document Revised: 11/29/2017 Document Reviewed: 07/31/2017 Elsevier Patient Education  2021 [place]   Jacqlyn Larsen [facility], BSN RN [employer] copywriter Family Medicine [phone]

## 2021-09-13 NOTE — Progress Notes (Signed)
Patient has been assessed, vital signs and labs have been reviewed by Dr. Katragadda. ANC, Creatinine, LFTs, and Platelets are within treatment parameters per Dr. Katragadda. The patient is good to proceed with treatment at this time. Primary RN and pharmacy aware.  

## 2021-09-13 NOTE — Chronic Care Management (AMB) (Signed)
Chronic Care Management   CCM RN Visit Note  09/13/2021 Name: NALY SCHWANZ MRN: [id number] DOB: 15-Apr-1950  Subjective: CONNIE LASATER is a 71 y.o. year old female who is a primary care patient of Erven Colla, DO. The care management team was consulted for assistance with disease management and care coordination needs.    Engaged with patient by telephone for follow up visit in response to provider referral for case management and/or care coordination services.   Consent to Services:  The patient was given information about Chronic Care Management services, agreed to services, and gave verbal consent prior to initiation of services.  Please see initial visit note for detailed documentation.   Patient agreed to services and verbal consent obtained.   Assessment: Review of patient past medical history, allergies, medications, health status, including review of consultants reports, laboratory and other test data, was performed as part of comprehensive evaluation and provision of chronic care management services.   SDOH (Social Determinants of Health) assessments and interventions performed:    CCM Care Plan  Allergies  Allergen Reactions   Penicillins Other (See Comments)    Nausea, vomiting, dizziness Has patient had a PCN reaction causing immediate rash, facial/tongue/throat swelling, SOB or lightheadedness with hypotension: no Has patient had a PCN reaction causing severe rash involving mucus membranes or skin necrosis: no Has patient had a PCN reaction that required hospitalization: no Has patient had a PCN reaction occurring within the last 10 years: no If all of the above answers are "NO", then may proceed with Cephalosporin use.    Percocet [Oxycodone-Acetaminophen] Diarrhea and Nausea And Vomiting   Ranitidine Itching    Outpatient Encounter Medications as of 09/13/2021  Medication Sig   albuterol (VENTOLIN HFA) 108 (90 Base) MCG/ACT inhaler INHALE TWO PUFFS INTO  THE LUNGS EVERY SIX HOURS AS NEEDED FOR WHEEZING   amLODipine (NORVASC) 5 MG tablet Take 1 tablet (5 mg total) by mouth daily.   anastrozole (ARIMIDEX) 1 MG tablet TAKE ONE (1) TABLET BY MOUTH EVERY DAY   aspirin 81 MG tablet Take 81 mg by mouth daily.   clonazePAM (KLONOPIN) 0.5 MG tablet Take  1 tab p.o. qhs. prn FOR ANXIETY   denosumab (PROLIA) 60 MG/ML SOSY injection Inject 60 mg into the skin every 6 (six) months.   Dextromethorphan-quiNIDine (NUEDEXTA) 20-10 MG capsule Nuedexta 20 mg-10 mg capsule  Take 1 capsule every 12 hours by oral route.  1 daily for 1 month and then increase to 1 twice a day   FLOVENT HFA 44 MCG/ACT inhaler INHALE TWO PUFFS INTO THE LUNGS IN THE MORNING AND AT BEDTIME TO PREVENT WHEEZING   ibuprofen (ADVIL) 200 MG tablet Take 200 mg by mouth at bedtime as needed.   montelukast (SINGULAIR) 10 MG tablet TAKE ONE TABLET (10MG TOTAL) BY MOUTH ATBEDTIME   OVER THE COUNTER MEDICATION Stool softner once daily   potassium chloride (KLOR-CON) 10 MEQ tablet Take 2 tablets (20 mEq total) by mouth daily.   sertraline (ZOLOFT) 100 MG tablet Take 150 mg by mouth daily.   Calcium Carb-Cholecalciferol (CALCIUM + VITAMIN D3 PO) Take 1 tablet by mouth 2 (two) times daily.   No facility-administered encounter medications on file as of 09/13/2021.    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   Allergic rhinitis 02/20/2015   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[place])  02/20/2015   Essential hypertension, benign 12/16/2013   Type 2 diabetes mellitus ([place]) 12/16/2013    Conditions to be addressed/monitored:HTN and Dementia  Care Plan : Dementia (Adult)  Updates made by Kassie Mends, RN since 09/13/2021 12:00 AM     Problem: Behavioral Symptoms   Priority: High     Long-Range Goal: Behavior Symptoms Management   Start Date: 08/09/2021  Expected End Date: 02/09/2022  This Visit's Progress: On track  Recent Progress: On track  Priority: High  Note:   Current Barriers:  Ineffective Self Health Maintenance in a patient with dementia- patient lives with her daughter [name], son in law and grandson, attends adult day care 2 x per week ([facility]), pt no longer attending outpatient physical therapy (was attending due to recent decline with ambulation) went for 18 visits but continues to have falls, per daughter- patient's condition related to dementia, weakness, falls, etc has worsened over the past few weeks, sometimes pt does not recognize family members and wanders at times, does not sleep as well- maybe 4-6 hours per night, daughter has applied for medicaid and is awaiting response and will be considering her options for long term care planning as keeping patient at home may not be sustainable, daughter is working with LCSW for long term care planning/ resources/ guidance. Daughter reports she is going to start going to a dementia support group, reports keep pt safe is number one priority. Patient does not check blood sugar and is on no medication for diabetes and last AIC <6.  Hospital bed with side rails has been ordered.  Unable to self administer medications as prescribed- daughter provides complete oversight over medications Unable to perform ADLs independently- patient is completely dependent on caregivers for assistance with bathing, cooking, Unable to perform IADLs independently- completely dependent Clinical Goal(s):  Collaboration with Erven Colla, DO regarding development and update of comprehensive plan of care as evidenced by provider attestation and co-signature Inter-disciplinary care team collaboration (see longitudinal plan of care) patient will work with care management team to address care coordination and chronic disease management needs related to Disease Management Care Coordination Medication Management and Education Caregiver Stress support   Interventions:  Evaluation of current treatment plan related to Dementia, ADL IADL limitations self-management and patient's adherence to plan as established by provider. Collaboration with Erven Colla, DO regarding development and update of comprehensive plan of care as evidenced by provider attestation       and co-signature Inter-disciplinary care team collaboration (see longitudinal plan of care) Reinforced plans with patient for ongoing care management follow up and provided patient with direct contact information for care management team Continue working with  LCSW for assistance with long term care planning, resources Reinforced safety as a priority for patient Encouraged offering small, frequent high calorie meals and snacks, adequate fluids throughout the day Limit day time naps to help with night time sleep Always use walker, DME for safety, work on exercises prescribed by outpatient physical therapist Encouraged attending dementia support group (daughter/ family members) Self Care Activities:  Attends all scheduled provider appointments Calls provider office for  new concerns or questions Patient Goals: - continue stay in touch with my family and friends as much as possible - keep pathways clear to prevent falls, stay well hydrated and do not ambulate if dizzy - take all medications as prescribed - continue to work with Education officer, museum for resources/ guidance - attend caregiver dementia support group - call your doctor for any new questions or concerns -  wear compression hose daily and take off at night - per reinforcement of provider- give high calorie meals and snacks Follow Up Plan: Telephone follow up appointment with care management team member scheduled for:  10/25/2021    Care Plan : Hypertension (Adult)  Updates made by Kassie Mends, RN since 09/13/2021 12:00 AM     Problem: Hypertension (Hypertension)   Priority: Medium     Long-Range Goal: Hypertension Monitored   Start Date: 08/09/2021  Expected End Date: 02/09/2022  This Visit's Progress: On track  Recent Progress: On track  Priority: Medium  Note:   Objective:  Last practice recorded BP readings:  BP Readings from Last 3 Encounters:  07/11/21 (!) 158/79  07/08/21 114/64  04/26/21 (!) 143/76   Most recent eGFR/CrCl: No results found for: EGFR  No components found for: CRCL Current Barriers:  Knowledge Deficits related to basic understanding of hypertension pathophysiology and self care management- blood pressure is checked at times, pt has dementia and lives with daughter, goes to adult day care 2 days per week, is incontinent, daughter reports she tries to encourage healthy diet, pt does not ask for food, if food is not offered pt would most likely not eat.  Patient has frequent falls and per daughter, Bobbye Charleston is now taken at night only, midodrine now discontinued as this did not seem to be making a difference and "made her blood pressure go up too much",  patient is now wearing thigh high compression hose to help with blood pressure and per daughter pt is wearing them but does not like them, she is taking them off at night. Patient did complete 18 outpatient visits for PT but still continued to have falls in spite of this. Daughter reports family is having more oversight over patient and assisting her more if ambulating, etc. Cognitive Deficits- patient has dementia which has worsened over the past few weeks, mobility has declined also. Daughter reports hospital bed has  been ordered so pt cannot get out of the bed at night. Unable to self administer medications as prescribed- daughter provides complete oversight Unable to perform ADLs independently- daughter / family assists with all aspects of care Unable to perform IADLs independently- complete dependent on family Case Manager Clinical Goal(s):  patient will verbalize understanding of plan for hypertension management patient will demonstrate improved adherence to prescribed treatment plan for hypertension as evidenced by taking all medications as prescribed, monitoring and recording blood pressure as directed, adhering to low sodium/DASH diet Interventions:  Collaboration with Erven Colla, DO regarding development and update of comprehensive plan of care as evidenced by provider attestation and co-signature Inter-disciplinary care team collaboration (see longitudinal plan of care) Evaluation of current treatment plan related to hypertension self management and patient's adherence to plan as established by provider. Reinforced education to patient's daughter DASH diet, complications of uncontrolled blood pressure Reinforced importance of medication compliance Reinforced plans with patient for ongoing care management follow up and provided patient with direct contact information for care management team Reinforced with patient's daughter providing education and rationale, to monitor blood pressure daily  and record, calling PCP for findings outside established parameters.  Reviewed scheduled/upcoming provider appointments including: 9/14 Cancer center injection/ office visit/ labs Reinforced safety precautions as pt requires 24 hour supervision Self-Care Activities: Attends all scheduled provider appointments Calls provider office for new concerns, questions, or BP outside discussed parameters Checks BP and records as discussed Follows a low sodium diet/DASH diet Patient Goals: - check blood pressure 3  times per week and record in a log - choose a place to take my blood pressure (home, clinic or office, retail store) - follow low salt diet- avoid salty snacks and fast food - continue doing any exercises prescribed by physical therapist Follow Up Plan: Telephone follow up appointment with care management team member scheduled for:  10/25/2021     Plan:Telephone follow up appointment with care management team member scheduled for:  10/25/2021  Jacqlyn Larsen [facility], BSN RN Case Manager [place] [phone]

## 2021-09-13 NOTE — Patient Instructions (Addendum)
[place] at [facility] Discharge Instructions  You were seen today by Dr. Delton Coombes. He went over your recent results, and you received your injection. Dr. Delton Coombes will see you back in 6 months for labs and follow up.   Thank you for choosing [place] at [facility] to provide your oncology and hematology care.  To afford each patient quality time with our provider, please arrive at least 15 minutes before your scheduled appointment time.   If you have a lab appointment with the [facility] please come in thru the Main Entrance and check in at the main information desk  You need to re-schedule your appointment should you arrive 10 or more minutes late.  We strive to give you quality time with our providers, and arriving late affects you and other patients whose appointments are after yours.  Also, if you no show three or more times for appointments you may be dismissed from the clinic at the providers discretion.     Again, thank you for choosing [facility].  Our hope is that these requests will decrease the amount of time that you wait before being seen by our physicians.       _____________________________________________________________  Should you have questions after your visit to [facility], please contact our office at [phone] between the hours of 8:00 a.m. and 4:30 p.m.  Voicemails left after 4:00 p.m. will not be returned until the following business day.  For prescription refill requests, have your pharmacy contact our office and allow 72 hours.    Cancer Center Support Programs:   > Cancer Support Group  2nd Tuesday of the month 1pm-2pm, Journey Room

## 2021-09-13 NOTE — Progress Notes (Signed)
Whitney Velez presents today for injection per the provider's orders.  Prolia administration without incident; injection site WNL; see MAR for injection details.  Patient tolerated procedure well and without incident and remained stable throughout.  No questions or complaints noted at this time. Discharge ambulatory with family member.

## 2021-09-14 LAB — CANCER ANTIGEN 15-3: CA 15-3: 10.2 U/mL (ref 0.0–25.0)

## 2021-09-16 ENCOUNTER — Other Ambulatory Visit: Payer: Self-pay | Admitting: Family Medicine

## 2021-09-19 DIAGNOSIS — R296 Repeated falls: Secondary | ICD-10-CM | POA: Diagnosis not present

## 2021-09-20 ENCOUNTER — Telehealth: Payer: Medicare Other

## 2021-09-21 ENCOUNTER — Telehealth: Payer: Self-pay | Admitting: *Deleted

## 2021-09-21 NOTE — Telephone Encounter (Signed)
  Care Management   Follow Up Note   09/21/2021 Name: Whitney Velez MRN: [id number] DOB: [birth year]   Referred by: Erven Colla, DO Reason for referral : No chief complaint on file.   An unsuccessful telephone outreach was attempted today. The patient was referred to the case management team for assistance with care management and care coordination.   Follow Up Plan: Telephone follow up appointment with care management team member scheduled for: pending reschedule.   Eduard Clos MSW, LCSW Licensed Clinical Social Worker [place] [phone]

## 2021-09-29 DIAGNOSIS — I10 Essential (primary) hypertension: Secondary | ICD-10-CM | POA: Diagnosis not present

## 2021-09-29 DIAGNOSIS — F0390 Unspecified dementia without behavioral disturbance: Secondary | ICD-10-CM

## 2021-10-10 ENCOUNTER — Ambulatory Visit (INDEPENDENT_AMBULATORY_CARE_PROVIDER_SITE_OTHER): Payer: Medicare Other | Admitting: *Deleted

## 2021-10-10 DIAGNOSIS — I10 Essential (primary) hypertension: Secondary | ICD-10-CM

## 2021-10-10 DIAGNOSIS — R410 Disorientation, unspecified: Secondary | ICD-10-CM

## 2021-10-10 DIAGNOSIS — R531 Weakness: Secondary | ICD-10-CM

## 2021-10-10 DIAGNOSIS — E119 Type 2 diabetes mellitus without complications: Secondary | ICD-10-CM

## 2021-10-10 NOTE — Patient Instructions (Signed)
Visit Information  PATIENT GOALS:  Goals Addressed   None     The patient verbalized understanding of instructions, educational materials, and care plan provided today and declined offer to receive copy of patient instructions, educational materials, and care plan.   Telephone follow up appointment with care management team member scheduled for:10/31/21  Eduard Clos MSW, LCSW Licensed Clinical Social Worker [facility] [phone]

## 2021-10-10 NOTE — Chronic Care Management (AMB) (Signed)
Chronic Care Management    Clinical Social Work Note  10/10/2021 Name: KARIS RILLING MRN: [id number] DOB: [birth year]  DEZIAH RENWICK is a 71 y.o. year old female who is a primary care patient of Erven Colla, DO. The CCM team was consulted to assist the patient with chronic disease management and/or care coordination needs related to: [employer] , Level of Care Concerns, and Caregiver Stress.   Engaged with patient by telephone for follow up visit in response to provider referral for social work chronic care management and care coordination services.   Consent to Services:  The patient was given information about Chronic Care Management services, agreed to services, and gave verbal consent prior to initiation of services.  Please see initial visit note for detailed documentation.   Patient agreed to services and consent obtained.   Assessment: Review of patient past medical history, allergies, medications, and health status, including review of relevant consultants reports was performed today as part of a comprehensive evaluation and provision of chronic care management and care coordination services.     SDOH (Social Determinants of Health) assessments and interventions performed:    Advanced Directives Status: Not addressed in this encounter.  CCM Care Plan  Allergies  Allergen Reactions   Penicillins Other (See Comments)    Nausea, vomiting, dizziness Has patient had a PCN reaction causing immediate rash, facial/tongue/throat swelling, SOB or lightheadedness with hypotension: no Has patient had a PCN reaction causing severe rash involving mucus membranes or skin necrosis: no Has patient had a PCN reaction that required hospitalization: no Has patient had a PCN reaction occurring within the last 10 years: no If all of the above answers are "NO", then may proceed with Cephalosporin use.    Percocet [Oxycodone-Acetaminophen] Diarrhea and Nausea And Vomiting    Ranitidine Itching    Outpatient Encounter Medications as of 10/10/2021  Medication Sig   albuterol (VENTOLIN HFA) 108 (90 Base) MCG/ACT inhaler INHALE TWO PUFFS INTO THE LUNGS EVERY SIX HOURS AS NEEDED FOR WHEEZING (Patient not taking: Reported on 09/13/2021)   amLODipine (NORVASC) 5 MG tablet Take 1 tablet (5 mg total) by mouth daily.   anastrozole (ARIMIDEX) 1 MG tablet TAKE ONE (1) TABLET BY MOUTH EVERY DAY   aspirin 81 MG tablet Take 81 mg by mouth daily.   Calcium Carb-Cholecalciferol (CALCIUM + VITAMIN D3 PO) Take 1 tablet by mouth 2 (two) times daily.   clonazePAM (KLONOPIN) 0.5 MG tablet Take  1 tab p.o. qhs. prn FOR ANXIETY   denosumab (PROLIA) 60 MG/ML SOSY injection Inject 60 mg into the skin every 6 (six) months.   Dextromethorphan-quiNIDine (NUEDEXTA) 20-10 MG capsule Nuedexta 20 mg-10 mg capsule  Take 1 capsule every 12 hours by oral route.  1 daily for 1 month and then increase to 1 twice a day   donepezil (ARICEPT) 5 MG tablet Take 5 mg by mouth daily.   FLOVENT HFA 44 MCG/ACT inhaler INHALE TWO PUFFS INTO THE LUNGS IN THE MORNING AND AT BEDTIME TO PREVENT WHEEZING   ibuprofen (ADVIL) 200 MG tablet Take 200 mg by mouth at bedtime as needed.   montelukast (SINGULAIR) 10 MG tablet TAKE ONE TABLET (10MG  TOTAL) BY MOUTH ATBEDTIME   OVER THE COUNTER MEDICATION Stool softner once daily   potassium chloride (KLOR-CON) 10 MEQ tablet Take 2 tablets (20 mEq total) by mouth daily.   sertraline (ZOLOFT) 100 MG tablet Take 150 mg by mouth daily.   No facility-administered encounter medications on file as of  10/10/2021.    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   Allergic rhinitis 02/20/2015   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[place]) 02/20/2015   Essential hypertension, benign 12/16/2013   Type 2 diabetes mellitus ([place]) 12/16/2013    Conditions to be addressed/monitored: Dementia and care needs ; Level of care concerns and Limited access to caregiver  Care Plan : General Social Work (Adult)  Updates made by Deirdre Peer, LCSW since 10/10/2021 12:00 AM     Problem: Long-Term Care Planning      Goal: Explore community optionns for long-term care   Start Date: 08/16/2021  Expected End Date: 11/29/2021  This Visit's Progress: On track  Recent Progress: On track  Priority: High  Note:   Current barriers:   Unable to consistently perform activities of daily living and needs additional assistance in order to meet this unmet need Unable to  independently self manage needs related to chronic health conditions.  Clinical Goals: patient's daughter will work with SW to address concerns related to long-term care planning Interventions : CSW spoke with daughter by phone who reports things are going more smoothly with pt. She is also sharing in caregiver role with her brother; rotating 2 weeks with her and then 2 weeks him.   Daughter continues to try to clarify the status of her mother's benefits; SSI vs SS and also her Medicaid status ( was told she had "Family Planning Medicaid" but this does not seem right based on her age.  Daughter to update CSW.  The CAPS, PCS, PACE programs will be contingent on her Medicaid status.     1:1 collaboration with primary care provider regarding development and update of comprehensive plan of care as evidenced by provider attestation and co-signature Inter-disciplinary care team collaboration (see longitudinal plan  of care) Assessed needs, level of care concerns, what patient is currently doing to meet need and support system. Review various resources, discussed options,  basic eligibility and provided patient information about:Department of Social Services ( Medicaid ), PACE Program, Community Alternative Program CAP, Private pay options home health needs, and Dementia resources and support  Solution-Focused Strategies, Active listening / Reflection utilized , Emotional Supportive Provided, Problem Solving [facility] , and Caregiver stress acknowledged  Patient Goals/Self-Care Activities: Over the next 30 days  Call [place] (emailed info to you) to clarify her benefits Call/visit Department of Social Services to follow up on the [employer] application  (email info to me to assist as discussed) I will coordinate with your PCP to assist with an FL2 when needed         Follow Up Plan: Appointment scheduled for SW follow up with client by phone on: 10/31/21      Eduard Clos MSW, LCSW Licensed Clinical Social Worker [place] [phone]

## 2021-10-11 ENCOUNTER — Other Ambulatory Visit: Payer: Self-pay

## 2021-10-19 DIAGNOSIS — R296 Repeated falls: Secondary | ICD-10-CM | POA: Diagnosis not present

## 2021-10-25 ENCOUNTER — Telehealth: Payer: Medicare Other

## 2021-10-30 DIAGNOSIS — Z79899 Other long term (current) drug therapy: Secondary | ICD-10-CM | POA: Diagnosis not present

## 2021-10-30 DIAGNOSIS — R2689 Other abnormalities of gait and mobility: Secondary | ICD-10-CM | POA: Diagnosis not present

## 2021-10-30 DIAGNOSIS — I951 Orthostatic hypotension: Secondary | ICD-10-CM | POA: Diagnosis not present

## 2021-10-30 DIAGNOSIS — R296 Repeated falls: Secondary | ICD-10-CM | POA: Diagnosis not present

## 2021-10-30 DIAGNOSIS — I10 Essential (primary) hypertension: Secondary | ICD-10-CM | POA: Diagnosis not present

## 2021-10-30 DIAGNOSIS — F482 Pseudobulbar affect: Secondary | ICD-10-CM | POA: Diagnosis not present

## 2021-10-30 DIAGNOSIS — E119 Type 2 diabetes mellitus without complications: Secondary | ICD-10-CM | POA: Diagnosis not present

## 2021-10-30 DIAGNOSIS — G309 Alzheimer's disease, unspecified: Secondary | ICD-10-CM | POA: Diagnosis not present

## 2021-10-31 ENCOUNTER — Ambulatory Visit (INDEPENDENT_AMBULATORY_CARE_PROVIDER_SITE_OTHER): Payer: Medicare Other | Admitting: *Deleted

## 2021-10-31 DIAGNOSIS — R413 Other amnesia: Secondary | ICD-10-CM

## 2021-10-31 DIAGNOSIS — I10 Essential (primary) hypertension: Secondary | ICD-10-CM

## 2021-10-31 DIAGNOSIS — E119 Type 2 diabetes mellitus without complications: Secondary | ICD-10-CM

## 2021-10-31 DIAGNOSIS — R634 Abnormal weight loss: Secondary | ICD-10-CM

## 2021-10-31 DIAGNOSIS — R410 Disorientation, unspecified: Secondary | ICD-10-CM

## 2021-10-31 NOTE — Patient Instructions (Signed)
Visit Information  The patient verbalized understanding of instructions, educational materials, and care plan provided today and declined offer to receive copy of patient instructions, educational materials, and care plan.   Telephone follow up appointment with care management team member scheduled for:12/11/21  Eduard Clos MSW, LCSW Licensed Clinical Social Worker [facility] [phone]

## 2021-10-31 NOTE — Chronic Care Management (AMB) (Signed)
Chronic Care Management    Clinical Social Work Note  10/31/2021 Name: Whitney Velez MRN: [id number] DOB: 1950/07/16  Whitney Velez is a 71 y.o. year old female who is a primary care patient of Coral Spikes, DO. The CCM team was consulted to assist the patient with chronic disease management and/or care coordination needs related to: Mental Health Counseling and Resources.   Engaged with patient by telephone for follow up visit in response to provider referral for social work chronic care management and care coordination services.   Consent to Services:  The patient was given information about Chronic Care Management services, agreed to services, and gave verbal consent prior to initiation of services.  Please see initial visit note for detailed documentation.   Patient agreed to services and consent obtained.   Assessment: Review of patient past medical history, allergies, medications, and health status, including review of relevant consultants reports was performed today as part of a comprehensive evaluation and provision of chronic care management and care coordination services.     SDOH (Social Determinants of Health) assessments and interventions performed:  SDOH Interventions    Flowsheet Row Most Recent Value  SDOH Interventions   Stress Interventions Other (Comment)  [dementia with behavior issues]        Advanced Directives Status: See Care Plan for related entries.  CCM Care Plan  Allergies  Allergen Reactions   Penicillins Other (See Comments)    Nausea, vomiting, dizziness Has patient had a PCN reaction causing immediate rash, facial/tongue/throat swelling, SOB or lightheadedness with hypotension: no Has patient had a PCN reaction causing severe rash involving mucus membranes or skin necrosis: no Has patient had a PCN reaction that required hospitalization: no Has patient had a PCN reaction occurring within the last 10 years: no If all of the above answers are  "NO", then may proceed with Cephalosporin use.    Percocet [Oxycodone-Acetaminophen] Diarrhea and Nausea And Vomiting   Ranitidine Itching    Outpatient Encounter Medications as of 10/31/2021  Medication Sig   albuterol (VENTOLIN HFA) 108 (90 Base) MCG/ACT inhaler INHALE TWO PUFFS INTO THE LUNGS EVERY SIX HOURS AS NEEDED FOR WHEEZING (Patient not taking: Reported on 09/13/2021)   amLODipine (NORVASC) 5 MG tablet Take 1 tablet (5 mg total) by mouth daily.   anastrozole (ARIMIDEX) 1 MG tablet TAKE ONE (1) TABLET BY MOUTH EVERY DAY   aspirin 81 MG tablet Take 81 mg by mouth daily.   Calcium Carb-Cholecalciferol (CALCIUM + VITAMIN D3 PO) Take 1 tablet by mouth 2 (two) times daily.   clonazePAM (KLONOPIN) 0.5 MG tablet Take  1 tab p.o. qhs. prn FOR ANXIETY   denosumab (PROLIA) 60 MG/ML SOSY injection Inject 60 mg into the skin every 6 (six) months.   Dextromethorphan-quiNIDine (NUEDEXTA) 20-10 MG capsule Nuedexta 20 mg-10 mg capsule  Take 1 capsule every 12 hours by oral route.  1 daily for 1 month and then increase to 1 twice a day   donepezil (ARICEPT) 5 MG tablet Take 5 mg by mouth daily.   FLOVENT HFA 44 MCG/ACT inhaler INHALE TWO PUFFS INTO THE LUNGS IN THE MORNING AND AT BEDTIME TO PREVENT WHEEZING   ibuprofen (ADVIL) 200 MG tablet Take 200 mg by mouth at bedtime as needed.   montelukast (SINGULAIR) 10 MG tablet TAKE ONE TABLET (10MG  TOTAL) BY MOUTH ATBEDTIME   OVER THE COUNTER MEDICATION Stool softner once daily   potassium chloride (KLOR-CON) 10 MEQ tablet Take 2 tablets (20 mEq total) by  mouth daily.   sertraline (ZOLOFT) 100 MG tablet Take 150 mg by mouth daily.   No facility-administered encounter medications on file as of 10/31/2021.    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   Allergic rhinitis 02/20/2015   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/20/2015   Essential hypertension, benign 12/16/2013   Type 2 diabetes mellitus ([place]) 12/16/2013    Conditions to be addressed/monitored: Dementia; Level of care concerns, Limited access to caregiver, Cognitive Deficits, and Memory Deficits  Care Plan : General Social Work (Adult)  Updates made by Whitney Peer, LCSW since 10/31/2021 12:00 AM     Problem: Long-Term Care Planning   Priority: High     Goal: Explore community optionns for long-term care   Start Date: 08/16/2021  Expected End Date: 12/29/2021  This Visit's Progress: On track  Recent Progress: On track  Priority: High  Note:   Current barriers:   Unable to consistently perform activities of daily living and needs additional assistance in order to meet this unmet need Unable to  independently self manage needs related to chronic health conditions.  Clinical Goals: patient's daughter will work with SW to address concerns related to long-term care planning Interventions : CSW spoke with daughter by phone who reports since last call when things were going more smoothly with pt, she has become more "aggressive, agitated and refusing to sometimes take her meds". They saw her Neurologist yesterday and she has been started on Zoloft per daughter. Pt has lost 36 LBS between her Oncology appointments in August and October; per daughter-  CSW mentioned a Palliative consult may be beneficial- her brother (pt's son) is not as on board with the Palliative/Hospice care.....  Daughter continues to try to clarify the status of her mother's benefits;  SSI vs SS and also her Medicaid status ( was told she had "Family Planning Medicaid" but this does not seem right based on her age.  Daughter to update CSW.  The CAPS, PCS, PACE programs will be contingent on her Medicaid status.     1:1 collaboration with primary care provider regarding development and update of comprehensive plan of care as evidenced by provider attestation and co-signature Inter-disciplinary care team collaboration (see longitudinal plan of care) Assessed needs, level of care concerns, what patient is currently doing to meet need and support system. Review various resources, discussed options,  basic eligibility and provided patient information about:Department of Social Services ( Medicaid ), PACE Program, Community Alternative Program CAP, Private pay options home health needs, and Dementia resources and support  Solution-Focused Strategies, Active listening / Reflection utilized , Emotional Supportive Provided, Problem Solving [facility] , and Caregiver stress acknowledged  Patient Goals/Self-Care Activities: Over the next 30 days  Call [place] (emailed info to you) to clarify her benefits Call/visit Department of Social Services to follow up on the [employer] application  (email info to me to assist as discussed) I will coordinate with your PCP to assist with  an FL2 when needed       Problem: No Advance Directive      Goal: Effective Long-Term Care Planning with Advance Directive in place   Start Date: 08/16/2021  Expected End Date: 12/29/2021  This Visit's Progress: Not on track  Recent Progress: On track  Priority: High  Note:   Current barriers:   Patient does not have an advance directive.  Needs education, support and coordination in order to meet this need. Clinical Goal(s): Over the next 30 days, the patient and daughter will review information on advance directive, complete advance directive packet and have notarized.  Interventions:Per  daughter, pt was too confused to complete at last MD visit- CSW advised daughter she may have to pursue Guardianship which she is aware of already and considering. 1:1 collaboration with primary care provider regarding development and update of comprehensive plan of care as evidenced by provider attestation and co-signature Inter-disciplinary care team collaboration (see longitudinal plan of care) Assessed understanding of Advance Directives with Daughter; Informed daughter to discuss capacity with PCP A voluntary discussion about advanced care planning including importance of advanced directives, healthcare proxy and living will was discussed with the patient's daughter unable to talk with patient.  Educational information on Advance Directives as well as an advance directive packet e-mailed.  Patient Goals/Self-Care Activities : Over the next 30 days Review educational information on Advance Directive  Complete Advance Directive packet,  Discuss with PCP capacity to appoint daughter as HPOA Have advance directive notarized and provide a copy to provider office         Follow Up Plan: Appointment scheduled for SW follow up with client by phone on: 12/11/21      Eduard Clos MSW, [place] Licensed Clinical Social Worker [place] [phone]

## 2021-11-08 ENCOUNTER — Ambulatory Visit: Payer: Medicare Other | Admitting: *Deleted

## 2021-11-08 DIAGNOSIS — I10 Essential (primary) hypertension: Secondary | ICD-10-CM

## 2021-11-08 DIAGNOSIS — F03C3 Unspecified dementia, severe, with mood disturbance: Secondary | ICD-10-CM

## 2021-11-08 NOTE — Patient Instructions (Addendum)
Visit Information  Dementia - keep pathways clear to prevent falls, stay well hydrated and do not ambulate if dizzy - take all medications as prescribed - continue to work with Education officer, museum for resources/ guidance, level of care issues - continue attending [facility] - attend caregiver dementia support group - call your doctor for any new questions or concerns - continue to reinforce wearing compression hose - per reinforcement of provider- give high calorie meals and snacks  Hypertension - check blood pressure 3 times per week and record in a log - encourage patient to wear compression hose - follow low salt diet- avoid salty snacks and fast food - continue doing any exercises prescribed by physical therapist - encourage patient to get outside daily  Patient verbalizes understanding of instructions provided today and agrees to view in [place].   Telephone follow up appointment with care management team member scheduled for:  12/20/2021  Jacqlyn Larsen [facility], BSN RN Case Manager [place] [phone]

## 2021-11-08 NOTE — Chronic Care Management (AMB) (Signed)
Chronic Care Management   CCM RN Visit Note  11/08/2021 Name: Whitney Velez MRN: [id number] DOB: Mar 16, 1950  Subjective: Whitney Velez is a 71 y.o. year old female who is a primary care patient of Coral Spikes, DO. The care management team was consulted for assistance with disease management and care coordination needs.    Engaged with patient by telephone for follow up visit in response to provider referral for case management and/or care coordination services.   Consent to Services:  The patient was given information about Chronic Care Management services, agreed to services, and gave verbal consent prior to initiation of services.  Please see initial visit note for detailed documentation.   Patient agreed to services and verbal consent obtained.   Assessment: Review of patient past medical history, allergies, medications, health status, including review of consultants reports, laboratory and other test data, was performed as part of comprehensive evaluation and provision of chronic care management services.   SDOH (Social Determinants of Health) assessments and interventions performed:    CCM Care Plan  Allergies  Allergen Reactions   Penicillins Other (See Comments)    Nausea, vomiting, dizziness Has patient had a PCN reaction causing immediate rash, facial/tongue/throat swelling, SOB or lightheadedness with hypotension: no Has patient had a PCN reaction causing severe rash involving mucus membranes or skin necrosis: no Has patient had a PCN reaction that required hospitalization: no Has patient had a PCN reaction occurring within the last 10 years: no If all of the above answers are "NO", then may proceed with Cephalosporin use.    Percocet [Oxycodone-Acetaminophen] Diarrhea and Nausea And Vomiting   Ranitidine Itching    Outpatient Encounter Medications as of 11/08/2021  Medication Sig   amLODipine (NORVASC) 5 MG tablet Take 1 tablet (5 mg total) by mouth daily.    anastrozole (ARIMIDEX) 1 MG tablet TAKE ONE (1) TABLET BY MOUTH EVERY DAY   aspirin 81 MG tablet Take 81 mg by mouth daily.   Calcium Carb-Cholecalciferol (CALCIUM + VITAMIN D3 PO) Take 1 tablet by mouth 2 (two) times daily.   denosumab (PROLIA) 60 MG/ML SOSY injection Inject 60 mg into the skin every 6 (six) months.   Dextromethorphan-quiNIDine (NUEDEXTA) 20-10 MG capsule Nuedexta 20 mg-10 mg capsule  Take 1 capsule every 12 hours by oral route.  1 daily for 1 month and then increase to 1 twice a day   donepezil (ARICEPT) 5 MG tablet Take 5 mg by mouth daily.   FLOVENT HFA 44 MCG/ACT inhaler INHALE TWO PUFFS INTO THE LUNGS IN THE MORNING AND AT BEDTIME TO PREVENT WHEEZING   ibuprofen (ADVIL) 200 MG tablet Take 200 mg by mouth at bedtime as needed.   montelukast (SINGULAIR) 10 MG tablet TAKE ONE TABLET (10MG TOTAL) BY MOUTH ATBEDTIME   OVER THE COUNTER MEDICATION Stool softner once daily   potassium chloride (KLOR-CON) 10 MEQ tablet Take 2 tablets (20 mEq total) by mouth daily.   QUEtiapine (SEROQUEL) 25 MG tablet Take 100 mg by mouth at bedtime. Takes 4 tablets   sertraline (ZOLOFT) 100 MG tablet Take 150 mg by mouth daily.   albuterol (VENTOLIN HFA) 108 (90 Base) MCG/ACT inhaler INHALE TWO PUFFS INTO THE LUNGS EVERY SIX HOURS AS NEEDED FOR WHEEZING (Patient not taking: No sig reported)   clonazePAM (KLONOPIN) 0.5 MG tablet Take  1 tab p.o. qhs. prn FOR ANXIETY (Patient not taking: Reported on 11/08/2021)   No facility-administered encounter medications on file as of 11/08/2021.    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[place]) 02/20/2015   Essential hypertension, benign 12/16/2013   Type 2 diabetes mellitus ([place]) 12/16/2013    Conditions to be addressed/monitored:HTN and Dementia  Care Plan : Dementia (Adult)  Updates made by Kassie Mends, RN since 11/08/2021 12:00 AM     Problem: Behavioral Symptoms   Priority: High     Long-Range Goal: Behavior Symptoms Management   Start Date: 08/09/2021  Expected End Date: 02/09/2022  This Visit's Progress: On track  Recent Progress: On track  Priority: High  Note:   Current Barriers:  Ineffective Self Health Maintenance in a patient with dementia- patient lives with her daughter Whitney Velez, son in law and grandson, attends adult [facility] x per [facility] ([facility]), per daughter- patient's condition related to dementia, weakness, oral intake, mood etc has worsened over the past few weeks, sometimes pt does not recognize family members and wanders at times, has more paranoia, is suspicious, does not sleep as well- maybe 4-6 hours per night, daughter has applied for medicaid and is awaiting response and will be considering her options for long term care planning as keeping patient at home may not be sustainable, daughter is working with LCSW for long term care planning/ resources/ guidance. Reports pt is now on seroquel 128m prescribed by Dr. DMerlene Laughter(neuro) and this is helping. Patient's son is not interested in palliative care/ hospice at this time, this son is going to neurologist appointment in December to hopefully get a better understanding of patient's condition and plan of care. Daughter reports she is going to start going to a dementia support group, reports keep pt safe is number one priority. Patient does not check blood sugar and is on no medication  for diabetes and last AIC <6.   Unable to self administer medications as prescribed- daughter provides complete oversight over medications Unable to perform ADLs independently- patient is completely dependent on caregivers for assistance with bathing, cooking, Unable to perform IADLs independently- completely dependent Clinical Goal(s):  Collaboration with TErven Colla DO regarding development and update of comprehensive plan of care as evidenced by provider attestation and co-signature Inter-disciplinary care team collaboration (see longitudinal plan of care) patient will work with care management team to address care coordination and chronic disease management needs related to Disease Management Care Coordination Medication Management and Education Caregiver Stress support   Interventions:  Evaluation of current treatment plan related to Dementia, ADL IADL limitations self-management and patient's adherence to plan as established by provider. Collaboration with TErven Colla DO regarding development and update of comprehensive plan of care as evidenced by provider attestation       and co-signature Inter-disciplinary care team collaboration (see longitudinal plan of care) Reviewed plans with patient for ongoing care management follow up and provided patient with direct contact information for care management team Reinforced continuing to work with  LCSW for assistance with long  term care planning, resources Reviewed safety precautions as a priority for patient Encouraged offering small, frequent high calorie meals and snacks, adequate fluids throughout the day Limit day time naps to help with night time sleep Reinforced using walker, DME for safety, work on exercises prescribed by outpatient physical therapist Reinforced attending dementia support group (daughter/ family members) [place]:  Attends all scheduled provider appointments Calls provider office for new  concerns or questions Patient Goals: - keep pathways clear to prevent falls, stay well hydrated and do not ambulate if dizzy - take all medications as prescribed - continue to work with Education officer, museum for resources/ guidance, level of care issues - continue attending [facility] - attend caregiver dementia support group - call your doctor for any new questions or concerns - continue to reinforce wearing compression hose - per reinforcement of provider- give high calorie meals and snacks Follow Up Plan: Telephone follow up appointment with care management team member scheduled for:  12/20/2021    Care Plan : Hypertension (Adult)  Updates made by Kassie Mends, RN since 11/08/2021 12:00 AM     Problem: Hypertension (Hypertension)   Priority: Medium     Long-Range Goal: Hypertension Monitored   Start Date: 08/09/2021  Expected End Date: 02/09/2022  This Visit's Progress: On track  Recent Progress: On track  Priority: Medium  Note:   Objective:  Last practice recorded BP readings:  BP Readings from Last 3 Encounters:  07/11/21 (!) 158/79  07/08/21 114/64  04/26/21 (!) 143/76   Most recent eGFR/CrCl: No results found for: EGFR  No components found for: CRCL Current Barriers:  Knowledge Deficits related to basic understanding of hypertension pathophysiology and self care management- blood pressure is checked several times per week, pt has dementia and lives with daughter, goes to adult day care 2 days per week, is incontinent, daughter reports she tries to encourage healthy diet, pt does not ask for food, if food is not offered pt would most likely not eat.  Patient has frequent falls and per daughter, Bobbye [place] is now taken at night only, midodrine now discontinued as this did not seem to be making a difference and "made her blood pressure go up too much",  patient has thigh high compression hose to help with blood pressure but refuses to wear them. Patient did complete 18 outpatient  visits for PT.  Daughter reports family is having more oversight over patient and assisting her more if ambulating, etc. Cognitive Deficits- patient has dementia which has worsened over the past few weeks, mobility has declined also. Unable to self administer medications as prescribed- daughter provides complete oversight Unable to perform ADLs independently- daughter / family assists with all aspects of care Unable to perform IADLs independently- complete dependent on family Case Manager Clinical Goal(s):  patient will verbalize understanding of plan for hypertension management patient will demonstrate improved adherence to prescribed treatment plan for hypertension as evidenced by taking all medications as prescribed, monitoring and recording blood pressure as directed, adhering to low sodium/DASH diet Interventions:  Collaboration with Erven Colla, DO regarding development and update of comprehensive plan of care as evidenced by provider attestation and co-signature Inter-disciplinary care team collaboration (see longitudinal plan of care) Evaluation of current treatment plan related to hypertension self management and patient's adherence to plan as established by provider. Reviewed education to patient's daughter DASH diet, complications of uncontrolled blood pressure Reviewed importance of medication compliance Reviewed plans with patient for ongoing care management follow up and  provided patient with direct contact information for care management team Reviewed with patient's daughter providing education and rationale, to monitor blood pressure daily and record, calling PCP for findings outside established parameters.  Reviewed scheduled/upcoming provider appointments including: Dr. Merlene Laughter (neurologist) 12/26/21 Reviewed safety precautions as pt requires 24 hour supervision Self-Care Activities: Attends all scheduled provider appointments Calls provider office for new concerns,  questions, or BP outside discussed parameters Checks BP and records as discussed Follows a low sodium diet/DASH diet Patient Goals: - check blood pressure 3 times per week and record in a log - encourage patient to wear compression hose - follow low salt diet- avoid salty snacks and fast food - continue doing any exercises prescribed by physical therapist - encourage patient to get outside daily Follow Up Plan: Telephone follow up appointment with care management team member scheduled for:  12/20/2021     Plan:Telephone follow up appointment with care management team member scheduled for: 12/20/2021  Jacqlyn Larsen [facility], BSN RN Case Manager [place] [phone]

## 2021-11-16 ENCOUNTER — Telehealth: Payer: Self-pay | Admitting: Family Medicine

## 2021-11-16 NOTE — Telephone Encounter (Signed)
  Left message for patient to call back and schedule Medicare Annual Wellness Visit (AWV) in office.   If unable to come into the office for AWV,  please offer to do virtually or by telephone.  No hx of AWV eligible for AWVI as of  01/31/2014 per [employer]    Please schedule at anytime with [facility]-Nurse Health Advisor.      40 Minutes appointment   Any questions, please call me at [phone]

## 2021-11-19 DIAGNOSIS — R296 Repeated falls: Secondary | ICD-10-CM | POA: Diagnosis not present

## 2021-12-06 DIAGNOSIS — Z79899 Other long term (current) drug therapy: Secondary | ICD-10-CM | POA: Diagnosis not present

## 2021-12-06 DIAGNOSIS — F482 Pseudobulbar affect: Secondary | ICD-10-CM | POA: Diagnosis not present

## 2021-12-06 DIAGNOSIS — R296 Repeated falls: Secondary | ICD-10-CM | POA: Diagnosis not present

## 2021-12-06 DIAGNOSIS — R2689 Other abnormalities of gait and mobility: Secondary | ICD-10-CM | POA: Diagnosis not present

## 2021-12-06 DIAGNOSIS — I951 Orthostatic hypotension: Secondary | ICD-10-CM | POA: Diagnosis not present

## 2021-12-06 DIAGNOSIS — G309 Alzheimer's disease, unspecified: Secondary | ICD-10-CM | POA: Diagnosis not present

## 2021-12-11 ENCOUNTER — Ambulatory Visit (INDEPENDENT_AMBULATORY_CARE_PROVIDER_SITE_OTHER): Payer: Medicare Other | Admitting: *Deleted

## 2021-12-11 ENCOUNTER — Ambulatory Visit: Payer: Self-pay | Admitting: *Deleted

## 2021-12-11 DIAGNOSIS — E119 Type 2 diabetes mellitus without complications: Secondary | ICD-10-CM

## 2021-12-11 DIAGNOSIS — R634 Abnormal weight loss: Secondary | ICD-10-CM

## 2021-12-11 DIAGNOSIS — F03C3 Unspecified dementia, severe, with mood disturbance: Secondary | ICD-10-CM

## 2021-12-11 NOTE — Chronic Care Management (AMB) (Signed)
Chronic Care Management    Clinical Social Work Note  12/11/2021 Name: Whitney Velez MRN: [id number] DOB: September 06, 1950  Whitney Velez is a 71 y.o. year old female who is a primary care patient of Coral Spikes, DO. The CCM team was consulted to assist the patient with chronic disease management and/or care coordination needs related to: [employer] , Level of Care Concerns, and Caregiver Stress.   Engaged with patient by telephone for follow up visit in response to provider referral for social work chronic care management and care coordination services.   Consent to Services:  The patient was given information about Chronic Care Management services, agreed to services, and gave verbal consent prior to initiation of services.  Please see initial visit note for detailed documentation.   Patient agreed to services and consent obtained.   Assessment: Review of patient past medical history, allergies, medications, and health status, including review of relevant consultants reports was performed today as part of a comprehensive evaluation and provision of chronic care management and care coordination services.     SDOH (Social Determinants of Health) assessments and interventions performed:    Advanced Directives Status: See Care Plan for related entries.  CCM Care Plan  Allergies  Allergen Reactions   Penicillins Other (See Comments)    Nausea, vomiting, dizziness Has patient had a PCN reaction causing immediate rash, facial/tongue/throat swelling, SOB or lightheadedness with hypotension: no Has patient had a PCN reaction causing severe rash involving mucus membranes or skin necrosis: no Has patient had a PCN reaction that required hospitalization: no Has patient had a PCN reaction occurring within the last 10 years: no If all of the above answers are "NO", then may proceed with Cephalosporin use.    Percocet [Oxycodone-Acetaminophen] Diarrhea and Nausea And Vomiting    Ranitidine Itching    Outpatient Encounter Medications as of 12/11/2021  Medication Sig   albuterol (VENTOLIN HFA) 108 (90 Base) MCG/ACT inhaler INHALE TWO PUFFS INTO THE LUNGS EVERY SIX HOURS AS NEEDED FOR WHEEZING (Patient not taking: No sig reported)   amLODipine (NORVASC) 5 MG tablet Take 1 tablet (5 mg total) by mouth daily.   anastrozole (ARIMIDEX) 1 MG tablet TAKE ONE (1) TABLET BY MOUTH EVERY DAY   aspirin 81 MG tablet Take 81 mg by mouth daily.   Calcium Carb-Cholecalciferol (CALCIUM + VITAMIN D3 PO) Take 1 tablet by mouth 2 (two) times daily.   clonazePAM (KLONOPIN) 0.5 MG tablet Take  1 tab p.o. qhs. prn FOR ANXIETY (Patient not taking: Reported on 11/08/2021)   denosumab (PROLIA) 60 MG/ML SOSY injection Inject 60 mg into the skin every 6 (six) months.   Dextromethorphan-quiNIDine (NUEDEXTA) 20-10 MG capsule Nuedexta 20 mg-10 mg capsule  Take 1 capsule every 12 hours by oral route.  1 daily for 1 month and then increase to 1 twice a day   donepezil (ARICEPT) 5 MG tablet Take 5 mg by mouth daily.   FLOVENT HFA 44 MCG/ACT inhaler INHALE TWO PUFFS INTO THE LUNGS IN THE MORNING AND AT BEDTIME TO PREVENT WHEEZING   ibuprofen (ADVIL) 200 MG tablet Take 200 mg by mouth at bedtime as needed.   montelukast (SINGULAIR) 10 MG tablet TAKE ONE TABLET (10MG  TOTAL) BY MOUTH ATBEDTIME   OVER THE COUNTER MEDICATION Stool softner once daily   potassium chloride (KLOR-CON) 10 MEQ tablet Take 2 tablets (20 mEq total) by mouth daily.   QUEtiapine (SEROQUEL) 25 MG tablet Take 100 mg by mouth at bedtime. Takes 4  tablets   sertraline (ZOLOFT) 100 MG tablet Take 150 mg by mouth daily.   No facility-administered encounter medications on file as of 12/11/2021.    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/2015   Essential hypertension, benign 12/16/2013   Type 2 diabetes mellitus ([place]) 12/16/2013    Conditions to be addressed/monitored: Dementia; Level of care concerns and Cognitive Deficits  Care Plan : General Social Work (Adult)  Updates made by Deirdre Peer, LCSW since 12/11/2021 12:00 AM     Problem: Long-Term Care Planning   Priority: High     Goal: Explore community optionns for long-term care   Start Date: 08/16/2021  Expected End Date: 01/30/2022  This Visit's Progress: On track  Recent Progress: On track  Priority: High  Note:   Current barriers:   Unable to consistently perform activities of daily living and needs additional assistance in order to meet this unmet need Unable to  independently self manage needs related to chronic health conditions.  Clinical Goals: patient's daughter will work with SW to address concerns related to long-term care planning Interventions : 12/11/21- CSW spoke with daughter by phone who reports pt was started on Serequel to help with "settling her down" and will have a follow up visit with Neurologist 12/27.  Daughter shares that her mother is on 9 pills and is wondering if they are all necessary- we again discussed Palliative Consult to further assess and assist with care plans- she will contact PCP office (pt assigned to a new PCP since Dr Lovena Le has left) to get appointment for an initial visit with the new PCP.  Pt is continuing to go to the adult day care program a few days weekly- daughter concerned if her behaviors are not kept under control  this could become an issue.  Daughter also continues to work on options for possible Guardianship and possible Medicaid.    since last call when things were going more smoothly with pt, she has become more "aggressive, agitated and refusing to sometimes take her meds". They saw her Neurologist yesterday and she has been started on Zoloft per daughter. Pt has lost 36 LBS between her Oncology appointments in August and October; per daughter-  CSW mentioned a Palliative consult may be beneficial- her brother (pt's son) is not as on board with the Palliative/Hospice care.....  Daughter continues to try to clarify the status of her mother's benefits; SSI vs SS and also her Medicaid status ( was told she had "Family Planning Medicaid" but this does not seem right based on her age.  Daughter to update CSW.  The CAPS, PCS, PACE programs will be contingent on her Medicaid status.     1:1 collaboration with primary care provider regarding development and update of comprehensive plan of care as evidenced by provider attestation and co-signature Inter-disciplinary care team collaboration (see longitudinal plan of care) Assessed needs, level of care  concerns, what patient is currently doing to meet need and support system. Review various resources, discussed options,  basic eligibility and provided patient information about:Department of Social Services ( Medicaid ), PACE Program, Community Alternative Program CAP, Private pay options home health needs, and Dementia resources and support  Solution-Focused Strategies, Active listening / Reflection utilized , Emotional Supportive Provided, Problem Solving [facility] , and Caregiver stress acknowledged  Patient Goals/Self-Care Activities: Over the next 30 days Continue to seek info and answers for  Department of Social Services to follow-up on Medicaid application  Consider Palliative Care consult       Follow Up Plan: Appointment scheduled for SW follow up  with client by phone on: 01/11/22      Eduard Clos MSW, LCSW Licensed Clinical Social Worker [facility] [phone]

## 2021-12-11 NOTE — Patient Instructions (Signed)
Visit Information  Thank you for taking time to visit with me today. Please don't hesitate to contact me if I can be of assistance to you before our next scheduled telephone appointment.  Following are the goals we discussed today:  (Copy and paste patient goals from clinical care plan here)  Our next appointment is by telephone on 01/11/22 at 10:30AM  Please call the care guide team at [phone] if you need to cancel or reschedule your appointment.   If you are experiencing a Mental Health or [place] or need someone to talk to, please call the Suicide and Crisis Lifeline: 988 call 911   The patient verbalized understanding of instructions, educational materials, and care plan provided today and declined offer to receive copy of patient instructions, educational materials, and care plan.   Eduard Clos MSW, LCSW Licensed Clinical Social Worker [place] [phone]

## 2021-12-19 ENCOUNTER — Other Ambulatory Visit (HOSPITAL_COMMUNITY): Payer: Self-pay | Admitting: Hematology

## 2021-12-19 DIAGNOSIS — R296 Repeated falls: Secondary | ICD-10-CM | POA: Diagnosis not present

## 2021-12-20 ENCOUNTER — Ambulatory Visit: Payer: Medicare Other | Admitting: *Deleted

## 2021-12-20 DIAGNOSIS — F03C3 Unspecified dementia, severe, with mood disturbance: Secondary | ICD-10-CM

## 2021-12-20 DIAGNOSIS — I10 Essential (primary) hypertension: Secondary | ICD-10-CM

## 2021-12-20 NOTE — Chronic Care Management (AMB) (Signed)
Chronic Care Management   CCM RN Visit Note  12/20/2021 Name: Whitney Velez MRN: [id number] DOB: 1950-07-24  Subjective: Whitney Velez is a 71 y.o. year old female who is a primary care patient of Coral Spikes, DO. The care management team was consulted for assistance with disease management and care coordination needs.    Engaged with patient by telephone for follow up visit in response to provider referral for case management and/or care coordination services.   Consent to Services:  The patient was given information about Chronic Care Management services, agreed to services, and gave verbal consent prior to initiation of services.  Please see initial visit note for detailed documentation.   Patient agreed to services and verbal consent obtained.   Assessment: Review of patient past medical history, allergies, medications, health status, including review of consultants reports, laboratory and other test data, was performed as part of comprehensive evaluation and provision of chronic care management services.   SDOH (Social Determinants of Health) assessments and interventions performed:    CCM Care Plan  Allergies  Allergen Reactions   Penicillins Other (See Comments)    Nausea, vomiting, dizziness Has patient had a PCN reaction causing immediate rash, facial/tongue/throat swelling, SOB or lightheadedness with hypotension: no Has patient had a PCN reaction causing severe rash involving mucus membranes or skin necrosis: no Has patient had a PCN reaction that required hospitalization: no Has patient had a PCN reaction occurring within the last 10 years: no If all of the above answers are "NO", then may proceed with Cephalosporin use.    Percocet [Oxycodone-Acetaminophen] Diarrhea and Nausea And Vomiting   Ranitidine Itching    Outpatient Encounter Medications as of 12/20/2021  Medication Sig   amLODipine (NORVASC) 5 MG tablet Take 1 tablet (5 mg total) by mouth daily.    anastrozole (ARIMIDEX) 1 MG tablet TAKE ONE (1) TABLET BY MOUTH EVERY DAY   aspirin 81 MG tablet Take 81 mg by mouth daily.   Calcium Carb-Cholecalciferol (CALCIUM + VITAMIN D3 PO) Take 1 tablet by mouth 2 (two) times daily.   denosumab (PROLIA) 60 MG/ML SOSY injection Inject 60 mg into the skin every 6 (six) months.   Dextromethorphan-quiNIDine (NUEDEXTA) 20-10 MG capsule Nuedexta 20 mg-10 mg capsule  Take 1 capsule every 12 hours by oral route.  1 daily for 1 month and then increase to 1 twice a day   donepezil (ARICEPT) 5 MG tablet Take 5 mg by mouth daily.   FLOVENT HFA 44 MCG/ACT inhaler INHALE TWO PUFFS INTO THE LUNGS IN THE MORNING AND AT BEDTIME TO PREVENT WHEEZING   ibuprofen (ADVIL) 200 MG tablet Take 200 mg by mouth at bedtime as needed.   montelukast (SINGULAIR) 10 MG tablet TAKE ONE TABLET (10MG TOTAL) BY MOUTH ATBEDTIME   OVER THE COUNTER MEDICATION Stool softner once daily   potassium chloride (KLOR-CON) 10 MEQ tablet TAKE TWO (2) TABLETS BY MOUTH DAILY   QUEtiapine (SEROQUEL) 25 MG tablet Take 100 mg by mouth at bedtime. Takes 4 tablets   sertraline (ZOLOFT) 100 MG tablet Take 150 mg by mouth daily.   albuterol (VENTOLIN HFA) 108 (90 Base) MCG/ACT inhaler INHALE TWO PUFFS INTO THE LUNGS EVERY SIX HOURS AS NEEDED FOR WHEEZING (Patient not taking: Reported on 09/13/2021)   clonazePAM (KLONOPIN) 0.5 MG tablet Take  1 tab p.o. qhs. prn FOR ANXIETY (Patient not taking: Reported on 11/08/2021)   No facility-administered encounter medications on file as of 12/20/2021.    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   Allergic rhinitis 02/20/2015   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/2015   Essential hypertension, benign 12/16/2013   Type 2 diabetes mellitus ([place]) 12/16/2013    Conditions to be addressed/monitored:HTN and Dementia  Care Plan : RN Care Manager Plan of Care  Updates made by Kassie Mends, RN since 12/20/2021 12:00 AM     Problem: No plan of care established for management of chronic disease states  (HTN, dementia)   Priority: High     Long-Range Goal: Development of plan of care for chronic disease management  (HTN, Dementia)   Start Date: 12/20/2021  Expected End Date: 06/18/2022  Priority: High  Note:   Current Barriers:  Knowledge Deficits related to plan of care for management of HTN and Dementia  Ineffective Self Health Maintenance in a patient with dementia- patient lives with her daughter [name], son in law and grandson, attends adult day care 2 x per week ([facility]), per daughter- patient's condition related to dementia, weakness, oral intake, mood etc has worsened over the past few months, sometimes pt does not recognize family members and wanders at times, has more paranoia, is suspicious, does not sleep as well- maybe 4-6 hours per night, seroquel recently increased and this did not help much, seroquel now discontinued by neurologist and will be starting to take lorazepam (has not begun this yet) daughter is considering her options for long term care planning as keeping patient at home may not be sustainable, pt has necessary DME in the home, daughter is working with LCSW for long term care planning/ resources/ guidance. Patient's son is not interested in palliative care/ hospice at this time.  Daughter reports keeping pt safe is number one priority. Patient does not check blood sugar and is on no medication for diabetes and  last AIC <6.  Daughter reports she has had difficulty getting appointment with new doctor at primary care provider office. Knowledge Deficits related to basic understanding of hypertension pathophysiology and self care management- blood pressure is checked several times per week, daughter reports she tries to encourage healthy diet, pt does not ask for food, if food is not offered pt would most likely not eat.  Patient is a fall risk per daughter, patient has thigh high compression hose to help with blood pressure but refuses to wear them. Patient did complete 18 outpatient visits for PT.  Daughter reports family is having more oversight over patient and assisting her more if ambulating, etc. Unable to self administer medications as prescribed- daughter provides complete oversight over medications Unable to perform ADLs independently- patient is completely dependent on caregivers for assistance with bathing, cooking, pt is incontinent. Unable to perform IADLs independently- completely dependent  RNCM Clinical Goal(s):  Patient will verbalize understanding of plan for management of HTN and Dementia as evidenced by caregiver report, review of EHR and  through collaboration with RN Care manager, provider, and care team.   Interventions: 1:1 collaboration with primary  care provider regarding development and update of comprehensive plan of care as evidenced by provider attestation and co-signature Inter-disciplinary care team collaboration (see longitudinal plan of care) Evaluation of current treatment plan related to  self management and patient's adherence to plan as established by provider   Hypertension Interventions:  (Status:  Goal on track:  Yes.) Long Term Goal Last practice recorded BP readings:  BP Readings from Last 3 Encounters:  09/11/21 136/86  07/11/21 (!) 158/79  07/08/21 114/64  Most recent eGFR/CrCl: No results found for: EGFR  No components found for: CRCL  Reviewed medications  with patient and discussed importance of compliance Discussed plans with patient for ongoing care management follow up and provided patient with direct contact information for care management team Discussed complications of poorly controlled blood pressure such as heart disease, stroke, circulatory complications, vision complications, kidney impairment, sexual dysfunction Reinforced low sodium diet Reinforced importance of continuing to check blood pressure regularly  Dementia:  (Status:  Goal on track:  Yes.)  Long Term Goal Evaluation of current treatment plan related to misuse of: Dementia with agitation and with behavioral disturbance Emotional Support Provided to patient/caregiver, Sleep hygiene recommendations and education provided, Discussed importance of discussing diagnosis with provider, Discussed importance of attendance to all provider appointments, and Advised to contact provider for new or worsening symptoms  Reinforced safety precautions In basket sent to primary care provider office requesting call to patient's daughter [name] to schedule visit  Patient Goals/Self-Care Activities: Take medications as prescribed   Attend all scheduled provider appointments Call provider office for new concerns or questions  Work with the social worker to address care coordination needs and will continue to work with the clinical team to address health care and disease management related needs check blood pressure 3 times per week keep a blood pressure log call doctor for signs and symptoms of high blood pressure keep all doctor appointments take medications for blood pressure exactly as prescribed Continue offering high calorie meals and snacks Practice safety precautions fall prevention strategies: change position slowly, use assistive device such as walker or cane (per provider recommendations) when walking, keep walkways clear, have good lighting in room. It is important to contact your  provider if you have any falls, maintain muscle strength/tone by exercise per provider recommendations. Please notify neurologist if new medication lorazepam does not help with sleep Call RN care manager for any questions at [phone]       Plan:Telephone follow up appointment with care management team member scheduled for:  02/07/2022  Jacqlyn Larsen [facility], BSN RN Case Manager [place] [phone]

## 2021-12-20 NOTE — Patient Instructions (Signed)
Visit Information  Thank you for taking time to visit with me today. Please don't hesitate to contact me if I can be of assistance to you before our next scheduled telephone appointment.  Following are the goals we discussed today:  Take medications as prescribed   Attend all scheduled provider appointments Call provider office for new concerns or questions  Work with the social worker to address care coordination needs and will continue to work with the clinical team to address health care and disease management related needs check blood pressure 3 times per week keep a blood pressure log call doctor for signs and symptoms of high blood pressure keep all doctor appointments take medications for blood pressure exactly as prescribed Continue offering high calorie meals and snacks Practice safety precautions fall prevention strategies: change position slowly, use assistive device such as walker or cane (per provider recommendations) when walking, keep walkways clear, have good lighting in room. It is important to contact your provider if you have any falls, maintain muscle strength/tone by exercise per provider recommendations. Please notify neurologist if new medication lorazepam does not help with sleep Call RN care manager for any questions at [phone]  Our next appointment is by telephone on 02/07/2022 at 3 pm  Please call the care guide team at [phone] if you need to cancel or reschedule your appointment.   If you are experiencing a Mental Health or [place] or need someone to talk to, please call the [place] National Suicide Prevention Lifeline: 847 765 4100 or TTY: 313 198 7661 TTY [phone]) to talk to a trained counselor call 1-800-273-TALK (toll free, 24 hour hotline) go to [facility] [address], [place] [phone]) call the [place]: [phone] call 911   Patient verbalizes  understanding of instructions provided today and agrees to view in [place].   Jacqlyn Larsen [facility], BSN RN Case Manager [facility] [phone]

## 2021-12-21 ENCOUNTER — Other Ambulatory Visit: Payer: Self-pay | Admitting: Nurse Practitioner

## 2021-12-26 DIAGNOSIS — R296 Repeated falls: Secondary | ICD-10-CM | POA: Diagnosis not present

## 2021-12-26 DIAGNOSIS — G309 Alzheimer's disease, unspecified: Secondary | ICD-10-CM | POA: Diagnosis not present

## 2021-12-26 DIAGNOSIS — R2689 Other abnormalities of gait and mobility: Secondary | ICD-10-CM | POA: Diagnosis not present

## 2021-12-26 DIAGNOSIS — I951 Orthostatic hypotension: Secondary | ICD-10-CM | POA: Diagnosis not present

## 2021-12-26 DIAGNOSIS — F482 Pseudobulbar affect: Secondary | ICD-10-CM | POA: Diagnosis not present

## 2021-12-26 DIAGNOSIS — Z79899 Other long term (current) drug therapy: Secondary | ICD-10-CM | POA: Diagnosis not present

## 2021-12-28 ENCOUNTER — Ambulatory Visit: Payer: Medicare Other | Admitting: Family Medicine

## 2022-01-03 ENCOUNTER — Ambulatory Visit (INDEPENDENT_AMBULATORY_CARE_PROVIDER_SITE_OTHER): Payer: Medicare Other | Admitting: Family Medicine

## 2022-01-03 ENCOUNTER — Other Ambulatory Visit: Payer: Self-pay

## 2022-01-03 DIAGNOSIS — R296 Repeated falls: Secondary | ICD-10-CM

## 2022-01-03 DIAGNOSIS — F03C3 Unspecified dementia, severe, with mood disturbance: Secondary | ICD-10-CM

## 2022-01-03 DIAGNOSIS — I10 Essential (primary) hypertension: Secondary | ICD-10-CM

## 2022-01-03 DIAGNOSIS — N183 Chronic kidney disease, stage 3 unspecified: Secondary | ICD-10-CM | POA: Insufficient documentation

## 2022-01-03 NOTE — Patient Instructions (Signed)
Stop the Amlodipine.  Use the Zyprexa a little more and see how it does.  If you want palliative, please let me know.  Follow up in 3 months.  Take care  Dr. Lacinda Axon

## 2022-01-04 NOTE — Assessment & Plan Note (Signed)
BP 102/71 today.  Stopping amlodipine.

## 2022-01-04 NOTE — Assessment & Plan Note (Signed)
Daughter is already doing everything that she can.  Room has been made as safe as possible.  Patient lives with her daughter.  It is thought that hypotension may be contributing.  As a result, I am discontinuing her amlodipine.  We discussed palliative care and daughter wants to wait at this time.

## 2022-01-04 NOTE — Progress Notes (Signed)
Subjective:  Patient ID: Whitney Velez, female    DOB: [birth year]  Age: 71 y.o. MRN: [id number]  CC: Chief Complaint  Patient presents with   Establish Care   Hypertension   Diabetes    Daughter states pt is getting up out of bed and has fallen twice (12/22/21 and 01/02/21). Pt has a knot and bruise on right upper thigh.    HPI:  72 year old female with HTN, dementia, hx of breast cancer presents to establish care with me.  She is accompanied by her daughter today.  Daughter is her primary caregiver.  Daughter provides a history given the fact that patient has dementia.  Daughter states that her blood pressures have been running low.  There is concerned that this may be contributing to her frequent falls.  Daughter would like to discuss this today.  She is currently on amlodipine for hypertension.  .Also states that as of late, she has been falling more frequently.  She recently fell on 12/23 and suffered a large hematoma to the right upper thigh.  She also fell on 1/3.  Daughter states that the falls occur when she wakes up from sleep and gets out of bed.  She wakes up anxious and is not sure where she is at subsequently gets out of bed abruptly and slips and falls.  She is currently on Zyprexa.  She is not taking this every day.  She is followed by neurology.  Daughter is very concerned.    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   Allergic rhinitis 02/20/2015   Essential hypertension, benign 12/16/2013    Social Hx   Social  History   Socioeconomic History   Marital status: Widowed    Spouse name: Not on file   Number of children: Not on file   Years of education: Not on file   Highest education level: Not on file  Occupational History   Not on file  Tobacco Use   Smoking status: Never   Smokeless tobacco: Never  Vaping Use   Vaping Use: Never used  Substance and Sexual Activity   Alcohol use: Never   Drug use: Never   Sexual activity: Not on file  Other Topics Concern   Not on file  Social History Narrative   Not on file   Social Determinants of Health   Financial Resource Strain: Not on file  Food Insecurity: No Food Insecurity   Worried About Running Out of Food in the Last Year: Never true   [place] in the Last Year: Never true  Transportation Needs: No Transportation Needs   Lack of Transportation (Medical): No   Lack of Transportation (Non-Medical): No  Physical Activity: Not on file  Stress: Stress Concern Present   Feeling of Stress : Rather much  Social Connections: Not on file    Review of Systems Per HPI  Objective:  BP 102/71    Pulse 66    Temp 98.8 F (37.1 C) (Oral)    Ht 5' (1.524 m)  Wt 149 lb 9.6 oz (67.9 kg)    SpO2 96%    BMI 29.22 kg/m   BP/Weight 01/03/2022 09/11/2021 2/95/2841  Systolic BP 324 401 027  Diastolic BP 71 86 79  Wt. (Lbs) 149.6 - -  BMI 29.22 32.22 -    Physical Exam Constitutional:      General: She is not in acute distress.    Appearance: Normal appearance. She is not ill-appearing.  HENT:     Head: Normocephalic and atraumatic.  Eyes:     General:        Right eye: No discharge.        Left eye: No discharge.     Conjunctiva/sclera: Conjunctivae normal.  Cardiovascular:     Rate and Rhythm: Normal rate and regular rhythm.  Pulmonary:     Effort: Pulmonary effort is normal.     Breath sounds: Normal breath sounds. No wheezing, rhonchi or rales.  Skin:    Comments: Large hematoma noted to the right proximal thigh.   Neurological:     Mental Status: She is alert.    Lab Results  Component Value Date   WBC 7.4 09/13/2021   HGB 13.0 09/13/2021   HCT 40.6 09/13/2021   PLT 269 09/13/2021   GLUCOSE 140 (H) 09/13/2021   CHOL 211 (H) 03/28/2020   TRIG 81 03/28/2020   HDL 65 03/28/2020   LDLCALC 132 (H) 03/28/2020   ALT 43 09/13/2021   AST 44 (H) 09/13/2021   NA 137 09/13/2021   K 3.6 09/13/2021   CL 99 09/13/2021   CREATININE 1.20 (H) 09/13/2021   BUN 17 09/13/2021   CO2 25 09/13/2021   TSH 1.120 01/26/2021   INR 1.1 11/03/2007   HGBA1C 5.5 03/28/2020   MICROALBUR 2.30 (H) 07/20/2014     Assessment & Plan:   Problem List Items Addressed This Visit       Cardiovascular and Mediastinum   Essential hypertension, benign (Chronic)    BP 102/71 today.  Stopping amlodipine.        Nervous and Auditory   Dementia ([place])    Continue current medication.  Advised to use Zyprexa nightly more often to see if she is going to have a beneficial effect.      Relevant Medications   memantine (NAMENDA) 10 MG tablet   OLANZapine (ZYPREXA) 7.5 MG tablet     Other   Falls frequently    Daughter is already doing everything that she can.  Room has been made as safe as possible.  Patient lives with her daughter.  It is thought that hypotension may be contributing.  As a result, I am discontinuing her amlodipine.  We discussed palliative care and daughter wants to wait at this time.       Follow-up:  3 months.  [place]

## 2022-01-04 NOTE — Assessment & Plan Note (Signed)
Continue current medication.  Advised to use Zyprexa nightly more often to see if she is going to have a beneficial effect.

## 2022-01-11 ENCOUNTER — Telehealth: Payer: Medicare Other

## 2022-01-12 ENCOUNTER — Telehealth: Payer: Self-pay | Admitting: *Deleted

## 2022-01-12 NOTE — Telephone Encounter (Signed)
°  Care Management   Follow Up Note   01/12/2022 Name: DENETTE HASS MRN: [id number] DOB: Feb 22, 1950   Referred by: Coral Spikes, DO Reason for referral : No chief complaint on file.   An unsuccessful telephone outreach was attempted today. The patient was referred to the case management team for assistance with care management and care coordination.   Follow Up Plan: The care management team will reach out to the patient again over the next 10-14  days.   Eduard Clos MSW, LCSW Licensed Clinical Social Education officer, [facility]

## 2022-01-17 ENCOUNTER — Other Ambulatory Visit (HOSPITAL_COMMUNITY): Payer: Self-pay | Admitting: Hematology

## 2022-01-17 DIAGNOSIS — C50811 Malignant neoplasm of overlapping sites of right female breast: Secondary | ICD-10-CM

## 2022-01-18 ENCOUNTER — Encounter (HOSPITAL_COMMUNITY): Payer: Self-pay | Admitting: Hematology

## 2022-01-19 DIAGNOSIS — R296 Repeated falls: Secondary | ICD-10-CM | POA: Diagnosis not present

## 2022-01-23 ENCOUNTER — Telehealth: Payer: Self-pay | Admitting: *Deleted

## 2022-01-23 NOTE — Chronic Care Management (AMB) (Signed)
°  Care Management   Note  01/23/2022 Name: KSENIA KUNZ MRN: [id number] DOB: [birth year]  DESHANNA KAMA is a 72 y.o. year old female who is a primary care patient of Coral Spikes, DO and is actively engaged with the care management team. I reached out to Lorella Nimrod by phone today to assist with re-scheduling a follow up visit with the Licensed Clinical Social Worker  Follow up plan: Unsuccessful telephone outreach attempt made. A HIPAA compliant phone message was left for the patient providing contact information and requesting a return call.  The care management team will reach out to the patient again over the next 7 days.  If patient returns call to provider office, please advise to call [place] [facility] [place] [facility]  Direct Dial: [phone]

## 2022-01-26 ENCOUNTER — Telehealth: Payer: Medicare Other

## 2022-01-31 NOTE — Chronic Care Management (AMB) (Signed)
°  Care Management   Note  01/31/2022 Name: Whitney Velez MRN: [id number] DOB: 10-Jun-1950  Whitney Velez is a 72 y.o. year old female who is a primary care patient of Coral Spikes, DO and is actively engaged with the care management team. I reached out to Lorella Nimrod by phone today to assist with re-scheduling a follow up visit with the Licensed Clinical Social Worker  Follow up plan: Telephone appointment with care management team member scheduled for:02/12/22  [place], [place] [facility]  Direct Dial: [phone]

## 2022-02-07 ENCOUNTER — Ambulatory Visit (INDEPENDENT_AMBULATORY_CARE_PROVIDER_SITE_OTHER): Payer: Medicare Other | Admitting: *Deleted

## 2022-02-07 DIAGNOSIS — I10 Essential (primary) hypertension: Secondary | ICD-10-CM

## 2022-02-07 DIAGNOSIS — F03C3 Unspecified dementia, severe, with mood disturbance: Secondary | ICD-10-CM

## 2022-02-07 NOTE — Chronic Care Management (AMB) (Signed)
Chronic Care Management   CCM RN Visit Note  02/07/2022 Name: Whitney Velez MRN: [id number] DOB: 1950-02-03  Subjective: Whitney Velez is a 72 y.o. year old female who is a primary care patient of Coral Spikes, DO. The care management team was consulted for assistance with disease management and care coordination needs.    Engaged with patient by telephone for follow up visit in response to provider referral for case management and/or care coordination services.   Consent to Services:  The patient was given information about Chronic Care Management services, agreed to services, and gave verbal consent prior to initiation of services.  Please see initial visit note for detailed documentation.   Patient agreed to services and verbal consent obtained.   Assessment: Review of patient past medical history, allergies, medications, health status, including review of consultants reports, laboratory and other test data, was performed as part of comprehensive evaluation and provision of chronic care management services.   SDOH (Social Determinants of Health) assessments and interventions performed:    CCM Care Plan  Allergies  Allergen Reactions   Penicillins Other (See Comments)    Nausea, vomiting, dizziness Has patient had a PCN reaction causing immediate rash, facial/tongue/throat swelling, SOB or lightheadedness with hypotension: no Has patient had a PCN reaction causing severe rash involving mucus membranes or skin necrosis: no Has patient had a PCN reaction that required hospitalization: no Has patient had a PCN reaction occurring within the last 10 years: no If all of the above answers are "NO", then may proceed with Cephalosporin use.    Percocet [Oxycodone-Acetaminophen] Diarrhea and Nausea And Vomiting   Ranitidine Itching    Outpatient Encounter Medications as of 02/07/2022  Medication Sig   albuterol (VENTOLIN HFA) 108 (90 Base) MCG/ACT inhaler INHALE TWO PUFFS INTO THE  LUNGS EVERY SIX HOURS AS NEEDED FOR WHEEZING   anastrozole (ARIMIDEX) 1 MG tablet TAKE ONE (1) TABLET BY MOUTH EVERY DAY   aspirin 81 MG tablet Take 81 mg by mouth daily.   Calcium Carb-Cholecalciferol (CALCIUM + VITAMIN D3 PO) Take 1 tablet by mouth 2 (two) times daily.   denosumab (PROLIA) 60 MG/ML SOSY injection Inject 60 mg into the skin every 6 (six) months.   Dextromethorphan-quiNIDine (NUEDEXTA) 20-10 MG capsule Nuedexta 20 mg-10 mg capsule  Take 1 capsule every 12 hours by oral route.  1 daily for 1 month and then increase to 1 twice a day   donepezil (ARICEPT) 5 MG tablet Take 5 mg by mouth daily.   ibuprofen (ADVIL) 200 MG tablet Take 200 mg by mouth at bedtime as needed.   memantine (NAMENDA) 10 MG tablet Take 10 mg by mouth 2 (two) times daily.   montelukast (SINGULAIR) 10 MG tablet TAKE ONE TABLET ($RemoveBef'10MG'yZIIrlCWnM$  TOTAL) BY MOUTH ATBEDTIME   OLANZapine (ZYPREXA) 7.5 MG tablet Take 7.5 mg by mouth at bedtime.   OVER THE COUNTER MEDICATION Stool softner once daily   potassium chloride (KLOR-CON) 10 MEQ tablet TAKE TWO (2) TABLETS BY MOUTH DAILY   sertraline (ZOLOFT) 100 MG tablet Take 150 mg by mouth daily.   No facility-administered encounter medications on file as of 02/07/2022.    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   Allergic rhinitis 02/20/2015   Essential hypertension, benign 12/16/2013    Conditions to be addressed/monitored:HTN and dementia  Care Plan : RN Care Manager Plan of Care  Updates made by Kassie Mends, RN  since 02/07/2022 12:00 AM     Problem: No plan of care established for management of chronic disease states  (HTN, dementia)   Priority: High     Long-Range Goal: Development of plan of care for chronic disease management  (HTN, Dementia)   Start Date: 12/20/2021  Expected End Date: 06/18/2022  Priority: High  Note:   Current Barriers:  Knowledge Deficits related to plan of care for management of HTN and Dementia  Ineffective Self Health Maintenance in a patient with dementia- patient lives with her daughter [name], son in law and grandson, attends adult day care 3 x per week ([facility]), per daughter- patient's condition related to dementia, weakness, oral intake, mood etc has worsened over the past few months, sometimes pt does not recognize family members and wanders at times, has more paranoia, is suspicious, sleeping slightly better- maybe 4-6 hours per night, pt is taking lorazepam and this has helped sleep some, daughter is considering her options for long term care planning as keeping patient at home may not be sustainable, pt has necessary DME in the home, daughter is working with LCSW for long term care planning/ resources/ guidance. Patient's son is not interested in palliative care/ hospice at this time.  Daughter reports keeping pt safe is number one priority. Patient does not check blood sugar and is on no medication for diabetes and last AIC <6.   Knowledge Deficits related to basic understanding of hypertension pathophysiology and self care management- blood pressure is checked several times per week, daughter reports she tries to encourage healthy diet, pt does not ask for food, if food is not offered pt would most likely not eat.  Patient is a fall risk per daughter, patient has thigh high compression hose to help with blood pressure but refuses to wear them. Patient did complete 18 outpatient visits for PT.  Daughter reports family is having more oversight over patient and assisting  her more if ambulating, etc.  Patient saw primary care provider due to having some hypotensive episodes and amlodipine discontinued and daughter reports "this helped and we have had no more low blood pressures" Unable to self administer medications as prescribed- daughter provides complete oversight over medications Unable to perform ADLs independently- patient is completely dependent on caregivers for assistance with bathing, cooking, pt is incontinent. Unable to perform IADLs independently- completely dependent  RNCM Clinical Goal(s):  Patient will verbalize understanding of plan for management of HTN and Dementia as evidenced by caregiver report, review of EHR and  through collaboration with RN Care manager, provider, and care team.   Interventions: 1:1 collaboration with primary care provider regarding development and update of comprehensive plan of care as evidenced by provider attestation and co-signature Inter-disciplinary care team collaboration (see longitudinal plan of care) Evaluation of current treatment plan related to  self management and patient's adherence to plan as established by provider   Hypertension Interventions:  (Status:  Goal on track:  Yes.) Long Term Goal Last practice recorded BP readings:  BP Readings from Last 3 Encounters:  09/11/21 136/86  07/11/21 (!) 158/79  07/08/21 114/64  Most recent eGFR/CrCl: No results found for: EGFR  No components found for: CRCL  Reviewed medications with patient and discussed importance of compliance Discussed plans with patient for ongoing care management follow up and provided patient with direct contact information for care management team Discussed complications of poorly controlled blood pressure such as heart disease, stroke, circulatory complications, vision complications, kidney impairment, sexual dysfunction Reviewed importance of continuing to check blood pressure regularly and report any low blood pressures to  doctor  Dementia:  (Status:  Goal on track:  Yes.)  Long Term Goal Evaluation of current treatment plan related to misuse of: Dementia with agitation and with behavioral disturbance Emotional Support Provided to patient/caregiver, Sleep hygiene recommendations and education provided, Discussed importance of discussing diagnosis with provider, Discussed importance of attendance to all provider appointments, and Advised to contact provider for new or worsening symptoms  Reviewed safety precautions  Patient Goals/Self-Care Activities: Take medications as prescribed   Attend all scheduled provider appointments Call provider office for new concerns or questions  Work with the social worker to address care coordination needs and will continue to work with the clinical team to address health care and disease management related needs check blood pressure 3 times per week keep a blood pressure log keep all doctor appointments take medications for blood pressure exactly as prescribed Drink adequate fluids Amlodipine is discontinued, please call your doctor if have any further low blood pressures Continue offering high calorie meals and snacks Practice safety precautions fall prevention strategies: change position slowly, use assistive device such as walker or cane (per provider recommendations) when walking, keep walkways clear, have good lighting in room. It is important to contact your provider if you have any falls, maintain muscle strength/tone by exercise per provider recommendations Call RN care manager for any questions at [phone]       Plan:Telephone follow up appointment with care management team member scheduled for:  04/04/22  Jacqlyn Larsen [facility], BSN RN Case Manager [place] [phone]

## 2022-02-07 NOTE — Patient Instructions (Signed)
Visit Information  Thank you for taking time to visit with me today. Please don't hesitate to contact me if I can be of assistance to you before our next scheduled telephone appointment.  Following are the goals we discussed today:  Take medications as prescribed   Attend all scheduled provider appointments Call provider office for new concerns or questions  Work with the social worker to address care coordination needs and will continue to work with the clinical team to address health care and disease management related needs check blood pressure 3 times per week keep a blood pressure log keep all doctor appointments take medications for blood pressure exactly as prescribed Drink adequate fluids Amlodipine is discontinued, please call your doctor if have any further low blood pressures Continue offering high calorie meals and snacks Practice safety precautions fall prevention strategies: change position slowly, use assistive device such as walker or cane (per provider recommendations) when walking, keep walkways clear, have good lighting in room. It is important to contact your provider if you have any falls, maintain muscle strength/tone by exercise per provider recommendations Call RN care manager for any questions at [phone]  Our next appointment is telephone outreach on 04/04/22 at 1045 am  Please call the care guide team at [phone] if you need to cancel or reschedule your appointment.   If you are experiencing a Mental Health or [place] or need someone to talk to, please call the [place] National Suicide Prevention Lifeline: (939)251-9937 or TTY: 9471953004 TTY 531-003-8185) to talk to a trained counselor call 1-800-273-TALK (toll free, 24 hour hotline) go to [facility] [address] [place] [phone]) call the [place]: [phone] call 911   Patient verbalizes understanding of  instructions and care plan provided today and agrees to view in [place]. Active MyChart status confirmed with patient.     Jacqlyn Larsen [facility], BSN RN Case Manager [facility] [phone]

## 2022-02-12 ENCOUNTER — Ambulatory Visit: Payer: Medicare Other

## 2022-02-12 DIAGNOSIS — E119 Type 2 diabetes mellitus without complications: Secondary | ICD-10-CM

## 2022-02-12 DIAGNOSIS — I10 Essential (primary) hypertension: Secondary | ICD-10-CM

## 2022-02-12 DIAGNOSIS — F03C3 Unspecified dementia, severe, with mood disturbance: Secondary | ICD-10-CM

## 2022-02-12 NOTE — Chronic Care Management (AMB) (Signed)
Chronic Care Management    Clinical Social Work Note  02/12/2022 Name: Whitney Velez MRN: [id number] DOB: [birth year]  Whitney Velez is a 72 y.o. year old female who is a primary care patient of Coral Spikes, DO. The CCM team was consulted to assist the patient with chronic disease management and/or care coordination needs related to: [employer] .   Collaboration with patient's daughter  for follow up visit in response to provider referral for social work chronic care management and care coordination services.   Consent to Services:  The patient was given information about Chronic Care Management services, agreed to services, and gave verbal consent prior to initiation of services.  Please see initial visit note for detailed documentation.   Patient agreed to services and consent obtained.   Assessment: Review of patient past medical history, allergies, medications, and health status, including review of relevant consultants reports was performed today as part of a comprehensive evaluation and provision of chronic care management and care coordination services.     SDOH (Social Determinants of Health) assessments and interventions performed:    Advanced Directives Status: Not addressed in this encounter.  CCM Care Plan  Allergies  Allergen Reactions   Penicillins Other (See Comments)    Nausea, vomiting, dizziness Has patient had a PCN reaction causing immediate rash, facial/tongue/throat swelling, SOB or lightheadedness with hypotension: no Has patient had a PCN reaction causing severe rash involving mucus membranes or skin necrosis: no Has patient had a PCN reaction that required hospitalization: no Has patient had a PCN reaction occurring within the last 10 years: no If all of the above answers are "NO", then may proceed with Cephalosporin use.    Percocet [Oxycodone-Acetaminophen] Diarrhea and Nausea And Vomiting   Ranitidine Itching    Outpatient Encounter  Medications as of 02/12/2022  Medication Sig   albuterol (VENTOLIN HFA) 108 (90 Base) MCG/ACT inhaler INHALE TWO PUFFS INTO THE LUNGS EVERY SIX HOURS AS NEEDED FOR WHEEZING   anastrozole (ARIMIDEX) 1 MG tablet TAKE ONE (1) TABLET BY MOUTH EVERY DAY   aspirin 81 MG tablet Take 81 mg by mouth daily.   Calcium Carb-Cholecalciferol (CALCIUM + VITAMIN D3 PO) Take 1 tablet by mouth 2 (two) times daily.   denosumab (PROLIA) 60 MG/ML SOSY injection Inject 60 mg into the skin every 6 (six) months.   Dextromethorphan-quiNIDine (NUEDEXTA) 20-10 MG capsule Nuedexta 20 mg-10 mg capsule  Take 1 capsule every 12 hours by oral route.  1 daily for 1 month and then increase to 1 twice a day   donepezil (ARICEPT) 5 MG tablet Take 5 mg by mouth daily.   ibuprofen (ADVIL) 200 MG tablet Take 200 mg by mouth at bedtime as needed.   memantine (NAMENDA) 10 MG tablet Take 10 mg by mouth 2 (two) times daily.   montelukast (SINGULAIR) 10 MG tablet TAKE ONE TABLET (10MG  TOTAL) BY MOUTH ATBEDTIME   OLANZapine (ZYPREXA) 7.5 MG tablet Take 7.5 mg by mouth at bedtime.   OVER THE COUNTER MEDICATION Stool softner once daily   potassium chloride (KLOR-CON) 10 MEQ tablet TAKE TWO (2) TABLETS BY MOUTH DAILY   sertraline (ZOLOFT) 100 MG tablet Take 150 mg by mouth daily.   No facility-administered encounter medications on file as of 02/12/2022.    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   Allergic rhinitis 02/20/2015   Essential hypertension, benign 12/16/2013     Conditions to be addressed/monitored: Dementia; Level of care concerns  Care Plan : General Social Work (Adult)  Updates made by Vern Claude, LCSW since 02/12/2022 12:00 AM     Problem: Long-Term Care Planning   Priority: High     Goal: Explore community optionns for long-term care   Start Date: 08/16/2021  Expected End Date: 01/30/2022  This Visit's Progress: On track  Recent Progress: On track  Priority: High  Note:   Current barriers:   Unable to consistently perform activities of daily living and needs additional assistance in order to meet this unmet need Unable to  independently self manage needs related to chronic health conditions.  Clinical Goals: patient's daughter will work with SW to address concerns related to long-term care planning Interventions : 12/11/21- CSW spoke with daughter by phone who reports pt was started on [place] to help with "settling her down" and will have a follow up visit with Neurologist 12/27.  Daughter shares that her mother is on 9 pills and is wondering if they are all necessary- we again discussed Palliative Consult to further assess and assist with care plans- she will contact PCP office (pt assigned to a new PCP since Dr Lovena Le has left) to get appointment for an initial visit with the new PCP.  Pt is continuing to go to the adult day care program a few days weekly- daughter concerned if her behaviors are not kept under control this could become an issue.  Daughter also continues to work on options for possible Guardianship and possible Medicaid.   since last call when things were going more smoothly with pt, she has become more "aggressive, agitated and refusing to sometimes take her meds". They saw her Neurologist yesterday and she has been started on Zoloft per daughter. Pt has lost 36 LBS between her Oncology appointments in August and October; per daughter-  CSW mentioned a Palliative consult may be beneficial- her brother (pt's son)  is not as on board with the Palliative/Hospice care.....  Daughter continues to try to clarify the status of her mother's benefits; SSI vs SS and also her Medicaid status ( was told she had "Family Planning Medicaid" but this does not seem right based on her age.  Daughter to update CSW.  The CAPS, PCS, PACE programs will be contingent on her Medicaid status.   02/12/22 Update Follow up phone call to patient's daughter to assess for continued community resource needs. Patient's daughter confirmed improvement in patient's mood in the last 4 weeks evidenced by decrease in paranoia-patient more calm and pleasant.   Patient's daughter confirmed that patient has experienced no falls since her blood pressure medication was discontinued. Patient continues to attend the Day Program at the [facility] 3 days per week-required to remain in wheelchair to avoid falls-Day Program going well, patient actively participates and seems to enjoy attending. Applying for Medicaid suspended at this time, due to patient's improvements and difficulty navigating this system. Daughter will contact this social worker if patient's condition progresses to the point where out of home placement is needed. Patient's daughter's support network explored as she is  primary caregiver-she does share care giving responsibilities with her brother, her spouse and employer are supportive, car giver stress acknowledged, self care encouraged. Patient's daughter provided with emotional support and positive reinforcement in regards to her motivation to provide care to patient.  1:1 collaboration with primary care provider regarding development and update of comprehensive plan of care as evidenced by provider attestation and co-signature Inter-disciplinary care team collaboration (see longitudinal plan of care) Patient Goals/Self-Care Activities:  Please contact this Education officer, museum as well as the Department of Social Services in the event that long term  care is needed in the future.      Follow Up Plan:  Patient's daughter will contact this social worker with any additional community resource needs.      [place], [place] [phone]

## 2022-02-12 NOTE — Patient Instructions (Signed)
Visit Information  Thank you for taking time to visit with me today. Please don't hesitate to contact me if I can be of assistance to you before our next scheduled telephone appointment.  Following are the goals we discussed today:  Please contact this social worker as well as the [facility] in the event that long term care is needed in the future.   If you are experiencing a Mental Health or [place] or need someone to talk to, please call the Suicide and Crisis Lifeline: 988   Patient verbalizes understanding of instructions and care plan provided today and agrees to view in [place]. Active MyChart status confirmed with patient.    No further follow up required: Patient's daughter to contact this Education officer, museum with any additional community resource needs  [employer], [place] [phone]

## 2022-02-16 NOTE — Progress Notes (Signed)
error 

## 2022-02-19 DIAGNOSIS — R296 Repeated falls: Secondary | ICD-10-CM | POA: Diagnosis not present

## 2022-02-26 DIAGNOSIS — F482 Pseudobulbar affect: Secondary | ICD-10-CM | POA: Diagnosis not present

## 2022-02-26 DIAGNOSIS — Z79899 Other long term (current) drug therapy: Secondary | ICD-10-CM | POA: Diagnosis not present

## 2022-02-26 DIAGNOSIS — F01518 Vascular dementia, unspecified severity, with other behavioral disturbance: Secondary | ICD-10-CM | POA: Diagnosis not present

## 2022-02-26 DIAGNOSIS — R251 Tremor, unspecified: Secondary | ICD-10-CM | POA: Diagnosis not present

## 2022-02-26 DIAGNOSIS — R296 Repeated falls: Secondary | ICD-10-CM | POA: Diagnosis not present

## 2022-03-07 ENCOUNTER — Telehealth: Payer: Self-pay | Admitting: Family Medicine

## 2022-03-07 NOTE — Telephone Encounter (Signed)
Daughter [name]) is needing a letter for [employer] stating that patient is unable to understand  decision medically and financially on her own due to her health condition. ?

## 2022-03-08 NOTE — Telephone Encounter (Signed)
Coral Spikes, DO  ? ?Note written. Will place up front.   ? ?

## 2022-03-08 NOTE — Telephone Encounter (Signed)
Daughter [name] [facility]) notified ?

## 2022-03-14 ENCOUNTER — Inpatient Hospital Stay (HOSPITAL_COMMUNITY): Payer: Medicare Other

## 2022-03-14 ENCOUNTER — Other Ambulatory Visit: Payer: Self-pay

## 2022-03-14 ENCOUNTER — Inpatient Hospital Stay (HOSPITAL_COMMUNITY): Payer: Medicare Other | Attending: Hematology

## 2022-03-14 ENCOUNTER — Inpatient Hospital Stay (HOSPITAL_BASED_OUTPATIENT_CLINIC_OR_DEPARTMENT_OTHER): Payer: Medicare Other | Admitting: Hematology

## 2022-03-14 VITALS — BP 155/73 | HR 64 | Temp 98.9°F | Resp 18 | Ht 60.0 in | Wt 157.2 lb

## 2022-03-14 DIAGNOSIS — Z79811 Long term (current) use of aromatase inhibitors: Secondary | ICD-10-CM | POA: Diagnosis not present

## 2022-03-14 DIAGNOSIS — Z170 Estrogen receptor positive status [ER+]: Secondary | ICD-10-CM | POA: Diagnosis not present

## 2022-03-14 DIAGNOSIS — Z79899 Other long term (current) drug therapy: Secondary | ICD-10-CM | POA: Insufficient documentation

## 2022-03-14 DIAGNOSIS — M8580 Other specified disorders of bone density and structure, unspecified site: Secondary | ICD-10-CM

## 2022-03-14 DIAGNOSIS — C50811 Malignant neoplasm of overlapping sites of right female breast: Secondary | ICD-10-CM

## 2022-03-14 DIAGNOSIS — Z7982 Long term (current) use of aspirin: Secondary | ICD-10-CM | POA: Insufficient documentation

## 2022-03-14 DIAGNOSIS — Z9013 Acquired absence of bilateral breasts and nipples: Secondary | ICD-10-CM | POA: Diagnosis not present

## 2022-03-14 DIAGNOSIS — E876 Hypokalemia: Secondary | ICD-10-CM | POA: Diagnosis not present

## 2022-03-14 DIAGNOSIS — C773 Secondary and unspecified malignant neoplasm of axilla and upper limb lymph nodes: Secondary | ICD-10-CM | POA: Insufficient documentation

## 2022-03-14 LAB — COMPREHENSIVE METABOLIC PANEL
ALT: 24 U/L (ref 0–44)
AST: 33 U/L (ref 15–41)
Albumin: 3.6 g/dL (ref 3.5–5.0)
Alkaline Phosphatase: 84 U/L (ref 38–126)
Anion gap: 9 (ref 5–15)
BUN: 24 mg/dL — ABNORMAL HIGH (ref 8–23)
CO2: 27 mmol/L (ref 22–32)
Calcium: 10 mg/dL (ref 8.9–10.3)
Chloride: 99 mmol/L (ref 98–111)
Creatinine, Ser: 1.12 mg/dL — ABNORMAL HIGH (ref 0.44–1.00)
GFR, Estimated: 52 mL/min — ABNORMAL LOW (ref >60)
Glucose, Bld: 75 mg/dL (ref 70–99)
Potassium: 3.9 mmol/L (ref 3.5–5.1)
Sodium: 135 mmol/L (ref 135–145)
Total Bilirubin: 0.4 mg/dL (ref 0.3–1.2)
Total Protein: 8.4 g/dL — ABNORMAL HIGH (ref 6.5–8.1)

## 2022-03-14 LAB — VITAMIN B12: Vitamin B-12: 377 pg/mL (ref 180–914)

## 2022-03-14 LAB — CBC WITH DIFFERENTIAL/PLATELET
Abs Immature Granulocytes: 0.03 10*3/uL (ref 0.00–0.07)
Basophils Absolute: 0 10*3/uL (ref 0.0–0.1)
Basophils Relative: 0 %
Eosinophils Absolute: 1.6 10*3/uL — ABNORMAL HIGH (ref 0.0–0.5)
Eosinophils Relative: 17 %
HCT: 36.1 % (ref 36.0–46.0)
Hemoglobin: 11.2 g/dL — ABNORMAL LOW (ref 12.0–15.0)
Immature Granulocytes: 0 %
Lymphocytes Relative: 16 %
Lymphs Abs: 1.4 10*3/uL (ref 0.7–4.0)
MCH: 28 pg (ref 26.0–34.0)
MCHC: 31 g/dL (ref 30.0–36.0)
MCV: 90.3 fL (ref 80.0–100.0)
Monocytes Absolute: 1 10*3/uL (ref 0.1–1.0)
Monocytes Relative: 12 %
Neutro Abs: 4.9 10*3/uL (ref 1.7–7.7)
Neutrophils Relative %: 55 %
Platelets: 228 10*3/uL (ref 150–400)
RBC: 4 MIL/uL (ref 3.87–5.11)
RDW: 16.6 % — ABNORMAL HIGH (ref 11.5–15.5)
WBC: 9 10*3/uL (ref 4.0–10.5)
nRBC: 0 % (ref 0.0–0.2)

## 2022-03-14 LAB — LACTATE DEHYDROGENASE: LDH: 144 U/L (ref 98–192)

## 2022-03-14 LAB — VITAMIN D 25 HYDROXY (VIT D DEFICIENCY, FRACTURES): Vit D, 25-Hydroxy: 20.67 ng/mL — ABNORMAL LOW (ref 30–100)

## 2022-03-14 MED ORDER — DENOSUMAB 60 MG/ML ~~LOC~~ SOSY
60.0000 mg | PREFILLED_SYRINGE | Freq: Once | SUBCUTANEOUS | Status: AC
  Administered 2022-03-14: 60 mg via SUBCUTANEOUS
  Filled 2022-03-14: qty 1

## 2022-03-14 NOTE — Patient Instructions (Signed)
[facility] at [facility] ?Discharge Instructions ? ?You were seen and examined today by Dr. Katragadda. He reviewed your most recent labs and everything looks good. Please keep follow up appointments as scheduled. ? ? ?Thank you for choosing [facility] at [facility] to provide your oncology and hematology care.  To afford each patient quality time with our provider, please arrive at least 15 minutes before your scheduled appointment time.  ? ?If you have a lab appointment with the Cancer Center please come in thru the Main Entrance and check in at the main information desk. ? ?You need to re-schedule your appointment should you arrive 10 or more minutes late.  We strive to give you quality time with our providers, and arriving late affects you and other patients whose appointments are after yours.  Also, if you no show three or more times for appointments you may be dismissed from the clinic at the providers discretion.     ?Again, thank you for choosing [facility].  Our hope is that these requests will decrease the amount of time that you wait before being seen by our physicians.       ?_____________________________________________________________ ? ?Should you have questions after your visit to [facility], please contact our office at [phone] and follow the prompts.  Our office hours are 8:00 a.m. and 4:30 p.m. Monday - Friday.  Please note that voicemails left after 4:00 p.m. may not be returned until the following business day.  We are closed weekends and major holidays.  You do have access to a nurse 24-7, just call the main number to the clinic [phone] and do not press any options, hold on the line and a nurse will answer the phone.   ? ?For prescription refill requests, have your pharmacy contact our office and allow 72 hours.   ? ?Due to Covid, you will need to wear a mask upon entering the hospital. If you do not have a  mask, a mask will be given to you at the Main Entrance upon arrival. For doctor visits, patients may have 1 support person age 18 or older with them. For treatment visits, patients can not have anyone with them due to social distancing guidelines and our immunocompromised population.  ? ?  ?

## 2022-03-14 NOTE — Patient Instructions (Signed)
[facility]  Discharge Instructions: ?Thank you for choosing [facility] to provide your oncology and hematology care.  ?If you have a lab appointment with the [facility], please come in thru the Main Entrance and check in at the main information desk. ? ?Wear comfortable clothing and clothing appropriate for easy access to any Portacath or PICC line.  ? ?We strive to give you quality time with your provider. You may need to reschedule your appointment if you arrive late (15 or more minutes).  Arriving late affects you and other patients whose appointments are after yours.  Also, if you miss three or more appointments without notifying the office, you may be dismissed from the clinic at the provider?s discretion.    ?  ?For prescription refill requests, have your pharmacy contact our office and allow 72 hours for refills to be completed.   ? ?Today you received the following chemotherapy and/or immunotherapy agents prolia.    ?  ?To help prevent nausea and vomiting after your treatment, we encourage you to take your nausea medication as directed. ? ?BELOW ARE SYMPTOMS THAT SHOULD BE REPORTED IMMEDIATELY: ?*FEVER GREATER THAN 100.4 F (38 ?C) OR HIGHER ?*CHILLS OR SWEATING ?*NAUSEA AND VOMITING THAT IS NOT CONTROLLED WITH YOUR NAUSEA MEDICATION ?*UNUSUAL SHORTNESS OF BREATH ?*UNUSUAL BRUISING OR BLEEDING ?*URINARY PROBLEMS (pain or burning when urinating, or frequent urination) ?*BOWEL PROBLEMS (unusual diarrhea, constipation, pain near the anus) ?TENDERNESS IN MOUTH AND THROAT WITH OR WITHOUT PRESENCE OF ULCERS (sore throat, sores in mouth, or a toothache) ?UNUSUAL RASH, SWELLING OR PAIN  ?UNUSUAL VAGINAL DISCHARGE OR ITCHING  ? ?Items with * indicate a potential emergency and should be followed up as soon as possible or go to the Emergency Department if any problems should occur. ? ?Please show the CHEMOTHERAPY ALERT CARD or IMMUNOTHERAPY ALERT CARD at check-in to the Emergency  Department and triage nurse. ? ?Should you have questions after your visit or need to cancel or reschedule your appointment, please contact [facility] [phone]  and follow the prompts.  Office hours are 8:00 a.m. to 4:30 p.m. Monday - Friday. Please note that voicemails left after 4:00 p.m. may not be returned until the following business day.  We are closed weekends and major holidays. You have access to a nurse at all times for urgent questions. Please call the main number to the clinic [phone] and follow the prompts. ? ?For any non-urgent questions, you may also contact your provider using MyChart. We now offer e-Visits for anyone 48 and older to request care online for non-urgent symptoms. For details visit [email or web address]. ?  ?Also download the MyChart app! Go to the app store, search "MyChart", open the app, select [facility], and log in with your MyChart username and password. ? ?Due to Covid, a mask is required upon entering the hospital/clinic. If you do not have a mask, one will be given to you upon arrival. For doctor visits, patients may have 1 support person aged 80 or older with them. For treatment visits, patients cannot have anyone with them due to current Covid guidelines and our immunocompromised population.  ?

## 2022-03-14 NOTE — Progress Notes (Signed)
Patient taking calcium as directed.  Denied tooth, jaw, and leg pain.  No recent or upcoming dental visits.  Labs reviewed.  Patient tolerated injection with no complaints voiced.  See MAR for details.  Patient stable during and after injection.  Site clean and dry with no bruising or swelling noted.  Band aid applied.  Vss with discharge and left in satisfactory condition with no s/s of distress noted.   

## 2022-03-14 NOTE — Progress Notes (Signed)
? ?[facility] ?[address] ?[place], [place] [address] ? ? ?Patient Care Team: ?Coral Spikes, DO as PCP - General (Family Medicine) ?Kassie Mends, RN as [place] Management ?[place], [place], [place] as Education officer, museum ? ?SUMMARY OF ONCOLOGIC HISTORY: ?Oncology History  ?Breast cancer, right ([place]) (Resolved)  ?05/12/2018 Initial Diagnosis  ? Breast cancer, right [facility]) ?  ?06/05/2018 - 10/24/2018 Chemotherapy  ? The patient had DOXOrubicin (ADRIAMYCIN) chemo injection 110 mg, 60 mg/m2 = 110 mg, Intravenous,  Once, 4 of 4 cycles ?Administration: 110 mg (06/05/2018), 110 mg (06/19/2018), 110 mg (07/07/2018), 110 mg (07/21/2018) ?palonosetron (ALOXI) injection 0.25 mg, 0.25 mg, Intravenous,  Once, 4 of 4 cycles ?Administration: 0.25 mg (06/05/2018), 0.25 mg (06/19/2018), 0.25 mg (07/07/2018), 0.25 mg (07/21/2018) ?pegfilgrastim-cbqv (UDENYCA) injection 6 mg, 6 mg, Subcutaneous, Once, 4 of 4 cycles ?Administration: 6 mg (06/06/2018), 6 mg (06/20/2018), 6 mg (07/08/2018), 6 mg (07/22/2018) ?cyclophosphamide (CYTOXAN) 1,100 mg in sodium chloride 0.9 % 250 mL chemo infusion, 600 mg/m2 = 1,100 mg, Intravenous,  Once, 4 of 4 cycles ?Administration: 1,100 mg (06/05/2018), 1,100 mg (06/19/2018), 1,100 mg (07/07/2018), 1,100 mg (07/21/2018) ?PACLitaxel (TAXOL) 144 mg in sodium chloride 0.9 % 250 mL chemo infusion (</= 69m/m2), 80 mg/m2 = 144 mg, Intravenous,  Once, 12 of 12 cycles ?Administration: 144 mg (08/07/2018), 144 mg (08/14/2018), 144 mg (08/21/2018), 144 mg (08/28/2018), 144 mg (09/04/2018), 144 mg (09/11/2018), 144 mg (09/18/2018), 144 mg (09/25/2018), 144 mg (10/02/2018), 144 mg (10/09/2018), 144 mg (10/17/2018), 144 mg (10/24/2018) ?fosaprepitant (EMEND) 150 mg, dexamethasone (DECADRON) 12 mg in sodium chloride 0.9 % 145 mL IVPB, , Intravenous,  Once, 4 of 4 cycles ?Administration:  (06/05/2018),  (06/19/2018),  (07/07/2018),  (07/21/2018) ? ? for chemotherapy treatment.  ? ?  ? ? ?CHIEF COMPLIANT: Follow-up for right breast  cancer ? ? ?INTERVAL HISTORY: Whitney Velez a 72y.o. female here today for follow up of her right breast cancer. Her last visit was on 09/13/2021.  ? ?Today she reports feeling good. Her appetite has improved. Her dementia has intermittently worsened and she occasionally will be unable to remember her birth date.  She started NOmanand Aricept in January. She is able to swallow all of her pills without trouble. She takes Potassium BID.  ? ?REVIEW OF SYSTEMS:   ?Review of Systems  ?Constitutional:  Negative for appetite change and fatigue.  ?HENT:   Negative for trouble swallowing.   ?Neurological:  Positive for dizziness, headaches and numbness.  ?Psychiatric/Behavioral:  The patient is nervous/anxious.   ?All other systems reviewed and are negative. ? ?I have reviewed the past medical history, past surgical history, social history and family history with the patient and they are unchanged from previous note. ? ? ?ALLERGIES:   ?is allergic to penicillins, percocet [oxycodone-acetaminophen], and ranitidine. ? ? ?MEDICATIONS:  ?Current Outpatient Medications  ?Medication Sig Dispense Refill  ? albuterol (VENTOLIN HFA) 108 (90 Base) MCG/ACT inhaler INHALE TWO PUFFS INTO THE LUNGS EVERY SIX HOURS AS NEEDED FOR WHEEZING 8.5 g 2  ? anastrozole (ARIMIDEX) 1 MG tablet TAKE ONE (1) TABLET BY MOUTH EVERY DAY 90 tablet 3  ? aspirin 81 MG tablet Take 81 mg by mouth daily.    ? Calcium Carb-Cholecalciferol (CALCIUM + VITAMIN D3 PO) Take 1 tablet by mouth 2 (two) times daily.    ? denosumab (PROLIA) 60 MG/ML SOSY injection Inject 60 mg into the skin every 6 (six) months.    ? Dextromethorphan-quiNIDine (  NUEDEXTA) 20-10 MG capsule Nuedexta 20 mg-10 mg capsule ? Take 1 capsule every 12 hours by oral route. ? 1 daily for 1 month and then increase to 1 twice a day    ? donepezil (ARICEPT) 5 MG tablet Take 5 mg by mouth daily.    ? ibuprofen (ADVIL) 200 MG tablet Take 200 mg by mouth at bedtime as needed.    ? memantine  (NAMENDA) 10 MG tablet Take 10 mg by mouth 2 (two) times daily.    ? montelukast (SINGULAIR) 10 MG tablet TAKE ONE TABLET (10MG TOTAL) BY MOUTH ATBEDTIME 90 tablet 0  ? OLANZapine (ZYPREXA) 7.5 MG tablet Take 7.5 mg by mouth at bedtime.    ? OVER THE COUNTER MEDICATION Stool softner once daily    ? potassium chloride (KLOR-CON) 10 MEQ tablet TAKE TWO (2) TABLETS BY MOUTH DAILY 60 tablet 3  ? QUEtiapine (SEROQUEL) 50 MG tablet Take 100 mg by mouth at bedtime.    ? sertraline (ZOLOFT) 100 MG tablet Take 150 mg by mouth daily.    ? ?No current facility-administered medications for this visit.  ? ?Facility-Administered Medications Ordered in Other Visits  ?Medication Dose Route Frequency Provider Last Rate Last Admin  ? denosumab (PROLIA) injection 60 mg  60 mg Subcutaneous Once Derek Jack, MD      ? ? ? ?PHYSICAL EXAMINATION: ?Performance status (ECOG): 1 - Symptomatic but completely ambulatory ? ?Vitals:  ? 03/14/22 1410  ?BP: (!) 155/73  ?Pulse: 64  ?Resp: 18  ?Temp: 98.9 ?F (37.2 ?C)  ?SpO2: 97%  ? ?Wt Readings from Last 3 Encounters:  ?03/14/22 157 lb 3.2 oz (71.3 kg)  ?01/03/22 149 lb 9.6 oz (67.9 kg)  ?07/08/21 165 lb (74.8 kg)  ? ?Physical Exam ?Vitals reviewed.  ?Constitutional:   ?   Appearance: Normal appearance.  ?Cardiovascular:  ?   Rate and Rhythm: Normal rate and regular rhythm.  ?   Pulses: Normal pulses.  ?   Heart sounds: Normal heart sounds.  ?Pulmonary:  ?   Effort: Pulmonary effort is normal.  ?   Breath sounds: Normal breath sounds.  ?Chest:  ?Breasts: ?   Right: Absent. No mass, skin change (mastectomy site WNL) or tenderness.  ?   Left: Absent. No mass, skin change (mastectomy site WNL) or tenderness.  ?Abdominal:  ?   Palpations: Abdomen is soft. There is no hepatomegaly, splenomegaly or mass.  ?Neurological:  ?   General: No focal deficit present.  ?   Mental Status: She is alert and oriented to person, place, and time.  ?Psychiatric:     ?   Mood and Affect: Mood normal.     ?    Behavior: Behavior normal.  ? ? ?Breast Exam Chaperone: Thana Ates   ? ? ?LABORATORY DATA:  ?I have reviewed the data as listed ?CMP Latest Ref Rng & Units 03/14/2022 09/13/2021 07/08/2021  ?Glucose 70 - 99 mg/dL 75 140(H) 91  ?BUN 8 - 23 mg/dL 24(H) 17 23  ?Creatinine 0.44 - 1.00 mg/dL 1.12(H) 1.20(H) 1.05(H)  ?Sodium 135 - 145 mmol/L 135 137 137  ?Potassium 3.5 - 5.1 mmol/L 3.9 3.6 3.5  ?Chloride 98 - 111 mmol/L 99 99 101  ?CO2 22 - 32 mmol/L _0 ?Calcium 8.9 - 10.3 mg/dL 10.0 10.0 10.0  ?Total Protein 6.5 - 8.1 g/dL 8.4(H) 8.1 8.4(H)  ?Total Bilirubin 0.3 - 1.2 mg/dL 0.4 0.5 0.3  ?Alkaline Phos 38 - 126 U/L 84 86 74  ?  AST 15 - 41 U/L 33 44(H) 43(H)  ?ALT 0 - 44 U/L 24 43 36  ? ?Lab Results  ?Component Value Date  ? WHQ759 10.2 09/13/2021  ? ?Lab Results  ?Component Value Date  ? WBC 9.0 03/14/2022  ? HGB 11.2 (L) 03/14/2022  ? HCT 36.1 03/14/2022  ? MCV 90.3 03/14/2022  ? PLT 228 03/14/2022  ? NEUTROABS 4.9 03/14/2022  ? ? ?ASSESSMENT:  ?1.  Locally advanced (stage IIIa, T3N1) right breast cancer: ?- Mammogram on 03/28/2018 was BI-RADS Category 0.  Mammogram and ultrasound on 04/22/2018 shows 5.9 x 5 x 6 cm mass extending in the upper quadrant of the right breast and right axillary lymph node measuring 2.8 x 1.6 x 1.7 cm.  Left breast has retroareolar mass.  Patient did not have mammogram in the last 20 years. ?- Biopsy on 04/29/2018 of the right breast mass consistent with IDC, grade 2-3, lymph node biopsy positive for metastatic disease, ER/PR positive, HER-2 negative, Ki-67 of 5% ?-Seen by Dr. Arnoldo Morale who recommended right modified radical mastectomy and left simple mastectomy. ?- Bone scan showed  increased uptake in the right greater trochanter and right sacroiliac joint.  CT scan of the chest abdomen and pelvis did not show any evidence of metastatic disease.  The uptake in the right greater trochanter was thought to be due to enthesopathic change.  Uptake in the right sacroiliac joint was likely from  degenerative changes at the right L5-S1 joint. ?- Due to advanced nature of her breast cancer, we have recommended neoadjuvant chemotherapy with dose dense AC followed by weekly paclitaxel for 12 weeks. ?- 4 c

## 2022-03-15 LAB — CANCER ANTIGEN 15-3: CA 15-3: 11.2 U/mL (ref 0.0–25.0)

## 2022-03-19 DIAGNOSIS — R296 Repeated falls: Secondary | ICD-10-CM | POA: Diagnosis not present

## 2022-03-20 ENCOUNTER — Other Ambulatory Visit: Payer: Self-pay | Admitting: Family Medicine

## 2022-04-04 ENCOUNTER — Ambulatory Visit (INDEPENDENT_AMBULATORY_CARE_PROVIDER_SITE_OTHER): Payer: Medicare Other | Admitting: *Deleted

## 2022-04-04 ENCOUNTER — Ambulatory Visit: Payer: Medicare Other | Admitting: Family Medicine

## 2022-04-04 DIAGNOSIS — I10 Essential (primary) hypertension: Secondary | ICD-10-CM

## 2022-04-04 DIAGNOSIS — F03C3 Unspecified dementia, severe, with mood disturbance: Secondary | ICD-10-CM

## 2022-04-04 NOTE — Chronic Care Management (AMB) (Signed)
?Chronic Care Management  ? ?CCM RN Visit Note ? ?04/04/2022 ?Name: Whitney Velez MRN: [id number] DOB: 06/20/1950 ? ?Subjective: ?Whitney Velez is a 72 y.o. year old female who is a primary care patient of Coral Spikes, DO. The care management team was consulted for assistance with disease management and care coordination needs.   ? ?Engaged with patient by telephone for follow up visit in response to provider referral for case management and/or care coordination services.  ? ?Consent to Services:  ?The patient was given information about Chronic Care Management services, agreed to services, and gave verbal consent prior to initiation of services.  Please see initial visit note for detailed documentation.  ? ?Patient agreed to services and verbal consent obtained.  ? ?Assessment: Review of patient past medical history, allergies, medications, health status, including review of consultants reports, laboratory and other test data, was performed as part of comprehensive evaluation and provision of chronic care management services.  ? ?SDOH (Social Determinants of Health) assessments and interventions performed:   ? ?CCM Care Plan ? ?Allergies  ?Allergen Reactions  ? Penicillins Other (See Comments)  ?  Nausea, vomiting, dizziness ?Has patient had a PCN reaction causing immediate rash, facial/tongue/throat swelling, SOB or lightheadedness with hypotension: no ?Has patient had a PCN reaction causing severe rash involving mucus membranes or skin necrosis: no ?Has patient had a PCN reaction that required hospitalization: no ?Has patient had a PCN reaction occurring within the last 10 years: no ?If all of the above answers are "NO", then may proceed with Cephalosporin use. ?  ? Percocet [Oxycodone-Acetaminophen] Diarrhea and Nausea And Vomiting  ? Ranitidine Itching  ? ? ?Outpatient Encounter Medications as of 04/04/2022  ?Medication Sig  ? albuterol (VENTOLIN HFA) 108 (90 Base) MCG/ACT inhaler INHALE TWO PUFFS INTO THE  LUNGS EVERY SIX HOURS AS NEEDED FOR WHEEZING  ? anastrozole (ARIMIDEX) 1 MG tablet TAKE ONE (1) TABLET BY MOUTH EVERY DAY  ? aspirin 81 MG tablet Take 81 mg by mouth daily.  ? Calcium Carb-Cholecalciferol (CALCIUM + VITAMIN D3 PO) Take 1 tablet by mouth 2 (two) times daily.  ? denosumab (PROLIA) 60 MG/ML SOSY injection Inject 60 mg into the skin every 6 (six) months.  ? Dextromethorphan-quiNIDine (NUEDEXTA) 20-10 MG capsule Nuedexta 20 mg-10 mg capsule ? Take 1 capsule every 12 hours by oral route. ? 1 daily for 1 month and then increase to 1 twice a day  ? donepezil (ARICEPT) 5 MG tablet Take 5 mg by mouth daily.  ? ibuprofen (ADVIL) 200 MG tablet Take 200 mg by mouth at bedtime as needed.  ? memantine (NAMENDA) 10 MG tablet Take 10 mg by mouth 2 (two) times daily.  ? montelukast (SINGULAIR) 10 MG tablet TAKE ONE TABLET ($RemoveBef'10MG'atUZnIPBtI$  TOTAL) BY MOUTH ATBEDTIME  ? OLANZapine (ZYPREXA) 7.5 MG tablet Take 7.5 mg by mouth at bedtime.  ? OVER THE COUNTER MEDICATION Stool softner once daily  ? potassium chloride (KLOR-CON) 10 MEQ tablet TAKE TWO (2) TABLETS BY MOUTH DAILY  ? sertraline (ZOLOFT) 100 MG tablet Take 150 mg by mouth daily.  ? QUEtiapine (SEROQUEL) 50 MG tablet Take 100 mg by mouth at bedtime. (Patient not taking: Reported on 04/04/2022)  ? ?No facility-administered encounter medications on file as of 04/04/2022.  ? ? ?Patient Active Problem List  ? Diagnosis Date Noted  ? CKD (chronic kidney disease) stage 3, GFR 30-59 ml/min (HCC) 01/03/2022  ? Urinary incontinence 07/11/2021  ? Falls frequently 07/11/2021  ? Dementia ([place])  04/24/2021  ? Osteopenia 05/08/2019  ? S/P bilateral mastectomy 12/01/2018  ? Invasive ductal carcinoma of breast, female, right ([place])   ? Ductal carcinoma in situ (DCIS) of left breast   ? Goals of care, counseling/discussion 05/29/2018  ? 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 Allergic rhinitis 02/20/2015  ? Essential hypertension, benign 12/16/2013  ? ? ?Conditions to be  addressed/monitored:HTN and Dementia ? ?Care Plan : RN Care Manager Plan of Care  ?Updates made by Kassie Mends, RN since 04/04/2022 12:00 AM  ?  ? ?Problem: No plan of care established for management of chronic disease states  (HTN, dementia)   ?Priority: High  ?  ? ?Long-Range Goal: Development of plan of care for chronic disease management  (HTN, Dementia)   ?Start Date: 12/20/2021  ?Expected End Date: 10/01/2022  ?Priority: High  ?Note:   ?Current Barriers:  ?Knowledge Deficits related to plan of care for management of HTN and Dementia  ?Ineffective Self Health Maintenance in a patient with dementia- patient lives with her daughter [name], son in law and grandson, attends adult day care 3 x per week ([facility]), per daughter- patient's condition related to dementia, weakness, oral intake, mood etc has improved over the past few weeks, sometimes pt does not recognize family members and wanders at times, paranoia, crying and having suspicions has improved, sleeping better, pt is taking lorazepam and this has helped sleep some, daughter is no longer considering her options for long term care planning as patient's behavior has improved, pt has necessary DME in the home, daughter is working with LCSW for any long term care planning/ resources/ guidance. Patient's son is not interested in palliative care/ hospice at this time.  Daughter reports keeping pt safe is number one priority. Patient does not check blood sugar and is on no medication for diabetes and last AIC <6.   ?Knowledge Deficits related to basic understanding of hypertension pathophysiology and self care management- blood pressure is checked several times per week, daughter reports she tries to encourage healthy diet, pt does not ask for food, if food is not offered pt would most likely not eat.  Patient is a fall risk per daughter, patient has thigh high compression hose to help with blood pressure but refuses to wear them. Patient did complete 18  outpatient visits for PT.  Daughter reports family is having more oversight over patient and assisting her more if ambulating, etc.  Patient saw primary care provider due to having some hypotensive episodes and amlodipine discontinued and daughter reports "this helped and we have had no more low blood pressures" ?Unable to self administer medications as prescribed- daughter provides complete oversight over medications ?Unable to perform ADLs independently- patient is completely dependent on caregivers for assistance with bathing, cooking, pt is incontinent. ?Unable to perform IADLs independently- completely dependent ? ?RNCM Clinical Goal(s):  ?Patient will verbalize understanding of plan for management of HTN and Dementia as evidenced by caregiver report, review of EHR and  through collaboration with RN Care manager, provider, and care team.  ? ?Interventions: ?1:1 collaboration with primary care provider regarding development and update of comprehensive plan of care as evidenced by provider attestation and co-signature ?Inter-disciplinary care team collaboration (see longitudinal plan of care) ?Evaluation of current treatment plan related to  self management and patient's adherence to plan as established by provider ? ? ?Hypertension Interventions:  (Status:  Goal on track:  Yes.) Long Term Goal ?Last practice recorded BP readings:  ?BP Readings  from Last 3 Encounters:  ?09/11/21 136/86  ?07/11/21 (!) 158/79  ?07/08/21 114/64  ?Most recent eGFR/CrCl: No results found for: EGFR  No components found for: CRCL ? ?Reviewed medications with patient and discussed importance of compliance ?Discussed plans with patient for ongoing care management follow up and provided patient with direct contact information for care management team ?Discussed complications of poorly controlled blood pressure such as heart disease, stroke, circulatory complications, vision complications, kidney impairment, sexual dysfunction ?Reinforced  importance of continuing to check blood pressure regularly and report any low blood pressures to doctor ?Reinforced low sodium diet ? ?Dementia:  (Status:  Goal on track:  Yes.)  Long Term Goal ?Evaluation of curre

## 2022-04-04 NOTE — Patient Instructions (Signed)
Visit Information ? ?Thank you for taking time to visit with me today. Please don't hesitate to contact me if I can be of assistance to you before our next scheduled telephone appointment. ? ?Following are the goals we discussed today:  ?Take medications as prescribed   ?Attend all scheduled provider appointments ?Call provider office for new concerns or questions  ?Work with the Education officer, museum to address care coordination needs and will continue to work with the clinical team to address health care and disease management related needs ?check blood pressure 3 times per week ?keep a blood pressure log ?keep all doctor appointments ?take medications for blood pressure exactly as prescribed ?report new symptoms to your doctor ?Drink adequate fluids ?Continue offering high calorie meals and snacks ?Practice safety precautions ?fall prevention strategies: change position slowly, use assistive device such as walker or cane (per provider recommendations) when walking, keep walkways clear, have good lighting in room. It is important to contact your provider if you have any falls, maintain muscle strength/tone by exercise per provider recomme ?tips for everyday care of patients with dementia: keep a routine, such as bathing dressing eating at the same time each day, plan activities that the person enjoys and try to do them at the same time each day, serve meals in a consistent, familiar place and give patient enough time to eat, encourage use of loose-fitting, comfortable, easy to use clothing such as, elastic waistbands, fabric fasteners, or large zipper pulls instead of shoelaces, buttons, or buckles. ?Call RN care manager for any questions at [phone] ? ?Our next appointment is by telephone on 06/27/22 at 9 am ? ?Please call the care guide team at [phone] if you need to cancel or reschedule your appointment.  ? ?If you are experiencing a Mental Health or [place] or need someone to talk to, please  call the Suicide and Crisis Lifeline: 988 ?call the [place] National Suicide Prevention Lifeline: 902 093 3000 or TTY: 954-389-9573 TTY 978 470 2443) to talk to a trained counselor ?call 1-800-273-TALK (toll free, 24 hour hotline) ?go to [facility] [address] [place] [phone]) ?call the [facility]: [phone] ?call 911  ? ?Patient verbalizes understanding of instructions and care plan provided today and agrees to view in [place]. Active MyChart status confirmed with patient.   ? ?Jacqlyn Larsen RNC, BSN ?RN Case Manager ?[place] ?[phone] ? ?

## 2022-04-11 ENCOUNTER — Ambulatory Visit (INDEPENDENT_AMBULATORY_CARE_PROVIDER_SITE_OTHER): Payer: Medicare Other | Admitting: Family Medicine

## 2022-04-11 DIAGNOSIS — F03C3 Unspecified dementia, severe, with mood disturbance: Secondary | ICD-10-CM

## 2022-04-11 DIAGNOSIS — I10 Essential (primary) hypertension: Secondary | ICD-10-CM | POA: Diagnosis not present

## 2022-04-11 NOTE — Progress Notes (Signed)
? ?Subjective:  ?Patient ID: Whitney Velez, female    DOB: [birth year]  Age: 72 y.o. MRN: [id number] ? ?CC: Follow-up ? ? ?HPI: ? ?72 year old female with hypertension, dementia, osteopenia, CKD, history of breast cancer presents for follow-up. ? ?Patient is accompanied by her daughter.  Daughter reports that she is doing exceptionally well at this time.  She has had a good response to medication for Alzheimer's.  Her behavior is good.  She is more talkative and more like her prior self.  Blood pressure is stable.  No recent falls.  ? ?Patient Active Problem List  ? Diagnosis Date Noted  ? CKD (chronic kidney disease) stage 3, GFR 30-59 ml/min (HCC) 01/03/2022  ? Urinary incontinence 07/11/2021  ? Falls frequently 07/11/2021  ? Dementia ([place]) 04/24/2021  ? Osteopenia 05/08/2019  ? S/P bilateral mastectomy 12/01/2018  ? Invasive ductal carcinoma of breast, female, right ([place])   ? Ductal carcinoma in situ (DCIS) of left breast   ? Goals of care, counseling/discussion 05/29/2018  ? Allergic rhinitis 02/20/2015  ? Essential hypertension, benign 12/16/2013  ? ? ?Social Hx   ?Social History  ? ?Socioeconomic History  ? Marital status: Widowed  ?  Spouse name: Not on file  ? Number of children: Not on file  ? Years of education: Not on file  ? Highest education level: Not on file  ?Occupational History  ? Not on file  ?Tobacco Use  ? Smoking status: Never  ? Smokeless tobacco: Never  ?Vaping Use  ? Vaping Use: Never used  ?Substance and Sexual Activity  ? Alcohol use: Never  ? Drug use: Never  ? Sexual activity: Not on file  ?Other Topics Concern  ? Not on file  ?Social History Narrative  ? Not on file  ? ?Social Determinants of Health  ? ?Financial Resource Strain: Not on file  ?Food Insecurity: No Food Insecurity  ? Worried About Charity fundraiser in the Last Year: Never true  ? Ran Out of Food in the Last Year: Never true  ?Transportation Needs: No Transportation Needs  ? Lack of Transportation (Medical): No  ? Lack  of Transportation (Non-Medical): No  ?Physical Activity: Not on file  ?Stress: Stress Concern Present  ? Feeling of Stress : Rather much  ?Social Connections: Not on file  ? ? ?Review of Systems ?Per HPI ? ?Objective:  ?BP 140/83   Pulse 89   Ht 5' (1.524 m)   Wt 161 lb 3.2 oz (73.1 kg)   BMI 31.48 kg/m?  ? ? ?  04/11/2022  ?  1:57 PM 03/14/2022  ?  2:10 PM 01/03/2022  ?  2:57 PM  ?BP/Weight  ?Systolic BP 629 528 413  ?Diastolic BP 83 73 71  ?Wt. (Lbs) 161.2 157.2 149.6  ?BMI 31.48 kg/m2 30.7 kg/m2 29.22 kg/m2  ? ? ?Physical Exam ?Vitals and nursing note reviewed.  ?Constitutional:   ?   Appearance: Normal appearance. She is obese.  ?HENT:  ?   Head: Normocephalic and atraumatic.  ?Cardiovascular:  ?   Rate and Rhythm: Normal rate and regular rhythm.  ?Pulmonary:  ?   Effort: Pulmonary effort is normal.  ?   Breath sounds: Normal breath sounds. No wheezing or rales.  ?Neurological:  ?   General: No focal deficit present.  ?   Mental Status: She is alert.  ?Psychiatric:     ?   Mood and Affect: Mood normal.     ?   Behavior: Behavior normal.  ? ? ?  Lab Results  ?Component Value Date  ? WBC 9.0 03/14/2022  ? HGB 11.2 (L) 03/14/2022  ? HCT 36.1 03/14/2022  ? PLT 228 03/14/2022  ? GLUCOSE 75 03/14/2022  ? CHOL 211 (H) 03/28/2020  ? TRIG 81 03/28/2020  ? HDL 65 03/28/2020  ? LDLCALC 132 (H) 03/28/2020  ? ALT 24 03/14/2022  ? AST 33 03/14/2022  ? NA 135 03/14/2022  ? K 3.9 03/14/2022  ? CL 99 03/14/2022  ? CREATININE 1.12 (H) 03/14/2022  ? BUN 24 (H) 03/14/2022  ? CO2 27 03/14/2022  ? TSH 1.120 01/26/2021  ? INR 1.1 11/03/2007  ? HGBA1C 5.5 03/28/2020  ? MICROALBUR 2.30 (H) 07/20/2014  ? ? ? ?Assessment & Plan:  ? ?Problem List Items Addressed This Visit   ? ?  ? Cardiovascular and Mediastinum  ? Essential hypertension, benign (Chronic)  ?  BP is stable off medication.  No hypotension and no falls.  She is doing well at this time.  We will continue to monitor.  Goal less than 150/90. ?  ?  ?  ? Nervous and Auditory  ?  Dementia ([place])  ?  She is doing well at this time.  Has had a good response to Aricept and Namenda as well as Zyprexa. ?  ?  ? ?Follow-up:  Return in about 6 months (around 10/11/2022). ? ?Thersa Salt DO ?[place] ? ?

## 2022-04-11 NOTE — Assessment & Plan Note (Signed)
She is doing well at this time.  Has had a good response to Aricept and Namenda as well as Zyprexa. ?

## 2022-04-11 NOTE — Assessment & Plan Note (Signed)
BP is stable off medication.  No hypotension and no falls.  She is doing well at this time.  We will continue to monitor.  Goal less than 150/90. ?

## 2022-04-11 NOTE — Patient Instructions (Signed)
Monitor her blood pressures intermittently.  Goal less than 150/90.   ? ?Follow-up in 6 months. ? ?Take care ? ?Dr. Lacinda Axon  ?

## 2022-04-16 ENCOUNTER — Other Ambulatory Visit (HOSPITAL_COMMUNITY): Payer: Self-pay | Admitting: Hematology

## 2022-04-19 DIAGNOSIS — R296 Repeated falls: Secondary | ICD-10-CM | POA: Diagnosis not present

## 2022-04-25 DIAGNOSIS — I951 Orthostatic hypotension: Secondary | ICD-10-CM | POA: Diagnosis not present

## 2022-04-25 DIAGNOSIS — F482 Pseudobulbar affect: Secondary | ICD-10-CM | POA: Diagnosis not present

## 2022-04-25 DIAGNOSIS — R2689 Other abnormalities of gait and mobility: Secondary | ICD-10-CM | POA: Diagnosis not present

## 2022-04-25 DIAGNOSIS — G309 Alzheimer's disease, unspecified: Secondary | ICD-10-CM | POA: Diagnosis not present

## 2022-04-29 DIAGNOSIS — F03C3 Unspecified dementia, severe, with mood disturbance: Secondary | ICD-10-CM

## 2022-04-29 DIAGNOSIS — I10 Essential (primary) hypertension: Secondary | ICD-10-CM

## 2022-05-01 ENCOUNTER — Ambulatory Visit (INDEPENDENT_AMBULATORY_CARE_PROVIDER_SITE_OTHER): Payer: Medicare Other

## 2022-05-01 VITALS — Ht 60.0 in | Wt 161.0 lb

## 2022-05-01 DIAGNOSIS — Z5919 Other inadequate housing: Secondary | ICD-10-CM

## 2022-05-01 DIAGNOSIS — Z0000 Encounter for general adult medical examination without abnormal findings: Secondary | ICD-10-CM | POA: Diagnosis not present

## 2022-05-01 NOTE — Addendum Note (Signed)
Addended by: Randal Buba K on: 05/01/2022 05:02 PM ? ? Modules accepted: Orders ? ?

## 2022-05-01 NOTE — Patient Instructions (Addendum)
Whitney Velez , ?Thank you for taking time to come for your Medicare Wellness Visit. I appreciate your ongoing commitment to your health goals. Please review the following plan we discussed and let me know if I can assist you in the future.  ? ?Screening recommendations/referrals: ?Colonoscopy: No longer required due to age.  ?Mammogram: Done 07/14/2020 Repeat annually ? ?Bone Density: Done 11/15/2018 Repeat every 2 years ? ?Recommended yearly ophthalmology/optometry visit for glaucoma screening and checkup ?Recommended yearly dental visit for hygiene and checkup ? ?Vaccinations: ?Influenza vaccine: Due Fall 2022. ?Pneumococcal vaccine: Done 02/16/2015  ?Tdap vaccine: Due Repeat in 10 years ? ?Shingles vaccine: Discussed.   ?Covid-19:Done 11/09/2020, 03/25/2020 and 02/25/2020 ? ?Advanced directives: Advance directive discussed with you today. Even though you declined this today, please call our office should you change your mind, and we can give you the proper paperwork for you to fill out.  ? ?Conditions/risks identified: Aim for 30 minutes of exercise or brisk walking, 6-8 glasses of water, and 5 servings of fruits and vegetables each day. ? ? ?Next appointment: Follow up in one year for your annual wellness visit 05/14/2023 @ 3:30 PM.  ? ? ?Preventive Care 74 Years and Older, Female ?Preventive care refers to lifestyle choices and visits with your health care provider that can promote health and wellness. ?What does preventive care include? ?A yearly physical exam. This is also called an annual well check. ?Dental exams once or twice a year. ?Routine eye exams. Ask your health care provider how often you should have your eyes checked. ?Personal lifestyle choices, including: ?Daily care of your teeth and gums. ?Regular physical activity. ?Eating a healthy diet. ?Avoiding tobacco and drug use. ?Limiting alcohol use. ?Practicing safe sex. ?Taking low-dose aspirin every day. ?Taking vitamin and mineral supplements as  recommended by your health care provider. ?What happens during an annual well check? ?The services and screenings done by your health care provider during your annual well check will depend on your age, overall health, lifestyle risk factors, and family history of disease. ?Counseling  ?Your health care provider may ask you questions about your: ?Alcohol use. ?Tobacco use. ?Drug use. ?Emotional well-being. ?Home and relationship well-being. ?Sexual activity. ?Eating habits. ?History of falls. ?Memory and ability to understand (cognition). ?Work and work Statistician. ?Reproductive health. ?Screening  ?You may have the following tests or measurements: ?Height, weight, and BMI. ?Blood pressure. ?Lipid and cholesterol levels. These may be checked every 5 years, or more frequently if you are over 20 years old. ?Skin check. ?Lung cancer screening. You may have this screening every year starting at age 50 if you have a 30-pack-year history of smoking and currently smoke or have quit within the past 15 years. ?Fecal occult blood test (FOBT) of the stool. You may have this test every year starting at age 26. ?Flexible sigmoidoscopy or colonoscopy. You may have a sigmoidoscopy every 5 years or a colonoscopy every 10 years starting at age 2. ?Hepatitis C blood test. ?Hepatitis B blood test. ?Sexually transmitted disease (STD) testing. ?Diabetes screening. This is done by checking your blood sugar (glucose) after you have not eaten for a while (fasting). You may have this done every 1-3 years. ?Bone density scan. This is done to screen for osteoporosis. You may have this done starting at age 51. ?Mammogram. This may be done every 1-2 years. Talk to your health care provider about how often you should have regular mammograms. ?Talk with your health care provider about your test results, treatment  options, and if necessary, the need for more tests. ?Vaccines  ?Your health care provider may recommend certain vaccines, such  as: ?Influenza vaccine. This is recommended every year. ?Tetanus, diphtheria, and acellular pertussis (Tdap, Td) vaccine. You may need a Td booster every 10 years. ?Zoster vaccine. You may need this after age 78. ?Pneumococcal 13-valent conjugate (PCV13) vaccine. One dose is recommended after age 69. ?Pneumococcal polysaccharide (PPSV23) vaccine. One dose is recommended after age 41. ?Talk to your health care provider about which screenings and vaccines you need and how often you need them. ?This information is not intended to replace advice given to you by your health care provider. Make sure you discuss any questions you have with your health care provider. ?Document Released: 01/13/2016 Document Revised: 09/05/2016 Document Reviewed: 10/18/2015 ?Elsevier Interactive Patient Education ? 2017 [place]. ? ?Fall Prevention in the Home ?Falls can cause injuries. They can happen to people of all ages. There are many things you can do to make your home safe and to help prevent falls. ?What can I do on the outside of my home? ?Regularly fix the edges of walkways and driveways and fix any cracks. ?Remove anything that might make you trip as you walk through a door, such as a raised step or threshold. ?Trim any bushes or trees on the path to your home. ?Use bright outdoor lighting. ?Clear any walking paths of anything that might make someone trip, such as rocks or tools. ?Regularly check to see if handrails are loose or broken. Make sure that both sides of any steps have handrails. ?Any raised decks and porches should have guardrails on the edges. ?Have any leaves, snow, or ice cleared regularly. ?Use sand or salt on walking paths during winter. ?Clean up any spills in your garage right away. This includes oil or grease spills. ?What can I do in the bathroom? ?Use night lights. ?Install grab bars by the toilet and in the tub and shower. Do not use towel bars as grab bars. ?Use non-skid mats or decals in the tub or  shower. ?If you need to sit down in the shower, use a plastic, non-slip stool. ?Keep the floor dry. Clean up any water that spills on the floor as soon as it happens. ?Remove soap buildup in the tub or shower regularly. ?Attach bath mats securely with double-sided non-slip rug tape. ?Do not have throw rugs and other things on the floor that can make you trip. ?What can I do in the bedroom? ?Use night lights. ?Make sure that you have a light by your bed that is easy to reach. ?Do not use any sheets or blankets that are too big for your bed. They should not hang down onto the floor. ?Have a firm chair that has side arms. You can use this for support while you get dressed. ?Do not have throw rugs and other things on the floor that can make you trip. ?What can I do in the kitchen? ?Clean up any spills right away. ?Avoid walking on wet floors. ?Keep items that you use a lot in easy-to-reach places. ?If you need to reach something above you, use a strong step stool that has a grab bar. ?Keep electrical cords out of the way. ?Do not use floor polish or wax that makes floors slippery. If you must use wax, use non-skid floor wax. ?Do not have throw rugs and other things on the floor that can make you trip. ?What can I do with my stairs? ?Do not  leave any items on the stairs. ?Make sure that there are handrails on both sides of the stairs and use them. Fix handrails that are broken or loose. Make sure that handrails are as long as the stairways. ?Check any carpeting to make sure that it is firmly attached to the stairs. Fix any carpet that is loose or worn. ?Avoid having throw rugs at the top or bottom of the stairs. If you do have throw rugs, attach them to the floor with carpet tape. ?Make sure that you have a light switch at the top of the stairs and the bottom of the stairs. If you do not have them, ask someone to add them for you. ?What else can I do to help prevent falls? ?Wear shoes that: ?Do not have high heels. ?Have  rubber bottoms. ?Are comfortable and fit you well. ?Are closed at the toe. Do not wear sandals. ?If you use a stepladder: ?Make sure that it is fully opened. Do not climb a closed stepladder. ?Make sure that both sid

## 2022-05-01 NOTE — Progress Notes (Signed)
? ?Subjective:  ? Whitney Velez is a 72 y.o. female who presents for an Initial Medicare Annual Wellness Visit. ?Virtual Visit via Telephone Note ? ?I connected with  Whitney Velez on 05/01/22 at  3:15 PM EDT by telephone and verified that I am speaking with the correct person using two identifiers. ? ?Location: ?Patient: HOME ?Provider: [facility] ?Persons participating in the virtual visit: patient/Nurse Health Advisor ?  ?I discussed the limitations, risks, security and privacy concerns of performing an evaluation and management service by telephone and the availability of in person appointments. The patient expressed understanding and agreed to proceed. ? ?Interactive audio and video telecommunications were attempted between this nurse and patient, however failed, due to patient having technical difficulties OR patient did not have access to video capability.  We continued and completed visit with audio only. ? ?Some vital signs may be absent or patient reported.  ? ?Chriss Driver, LPN ? ?Review of Systems    ? ?Cardiac Risk Factors include: advanced age (>10mn, >>23women);hypertension;sedentary lifestyle;obesity (BMI >30kg/m2) ? ?   ?Objective:  ?  ?Today's Vitals  ? 05/01/22 1524  ?Weight: 161 lb (73 kg)  ?Height: 5' (1.524 m)  ? ?Body mass index is 31.44 kg/m?. ? ? ?  05/01/2022  ?  3:35 PM 03/14/2022  ?  2:09 PM 09/13/2021  ?  2:58 PM 08/09/2021  ? 12:22 PM 07/08/2021  ?  1:37 PM 03/07/2021  ?  2:57 PM 09/06/2020  ? 11:32 AM  ?Advanced Directives  ?Does Patient Have a Medical Advance Directive? No No No No No No No  ?Would patient like information on creating a medical advance directive? No - Patient declined No - Patient declined No - Patient declined No - Patient declined  Yes (MAU/Ambulatory/Procedural Areas - Information given) No - Patient declined  ? ? ?Current Medications (verified) ?Outpatient Encounter Medications as of 05/01/2022  ?Medication Sig  ? albuterol (VENTOLIN HFA) 108 (90 Base) MCG/ACT inhaler  INHALE TWO PUFFS INTO THE LUNGS EVERY SIX HOURS AS NEEDED FOR WHEEZING  ? anastrozole (ARIMIDEX) 1 MG tablet TAKE ONE (1) TABLET BY MOUTH EVERY DAY  ? aspirin 81 MG tablet Take 81 mg by mouth daily.  ? Calcium Carb-Cholecalciferol (CALCIUM + VITAMIN D3 PO) Take 1 tablet by mouth 2 (two) times daily.  ? denosumab (PROLIA) 60 MG/ML SOSY injection Inject 60 mg into the skin every 6 (six) months.  ? Dextromethorphan-quiNIDine (NUEDEXTA) 20-10 MG capsule Nuedexta 20 mg-10 mg capsule ? Take 1 capsule every 12 hours by oral route. ? 1 daily for 1 month and then increase to 1 twice a day  ? donepezil (ARICEPT) 10 MG tablet Take 10 mg by mouth daily.  ? ibuprofen (ADVIL) 200 MG tablet Take 200 mg by mouth at bedtime as needed.  ? memantine (NAMENDA) 10 MG tablet Take 10 mg by mouth 2 (two) times daily.  ? montelukast (SINGULAIR) 10 MG tablet TAKE ONE TABLET ('10MG'$  TOTAL) BY MOUTH ATBEDTIME  ? OLANZapine (ZYPREXA) 7.5 MG tablet Take 7.5 mg by mouth at bedtime.  ? OVER THE COUNTER MEDICATION Stool softner once daily  ? potassium chloride (KLOR-CON) 10 MEQ tablet TAKE TWO (2) TABLETS BY MOUTH DAILY  ? sertraline (ZOLOFT) 100 MG tablet Take 150 mg by mouth daily.  ? [DISCONTINUED] donepezil (ARICEPT) 5 MG tablet Take 5 mg by mouth daily.  ? ?No facility-administered encounter medications on file as of 05/01/2022.  ? ? ?Allergies (verified) ?Penicillins, Percocet [oxycodone-acetaminophen], and Ranitidine  ? ?  History: ?Past Medical History:  ?Diagnosis Date  ? Asthma   ? Hypertension   ? ?Past Surgical History:  ?Procedure Laterality Date  ? ABDOMINAL HYSTERECTOMY    ? MASTECTOMY MODIFIED RADICAL Right 12/01/2018  ? Procedure: RIGHT MODIFIED RADICAL MASTECTOMY (Procedure #1);  Surgeon: Aviva Signs, MD;  Location: [facility];  Service: General;  Laterality: Right;  ? PORTACATH PLACEMENT Left 06/04/2018  ? Procedure: INSERTION PORT-A-CATH;  Surgeon: Aviva Signs, MD;  Location: [facility];  Service: General;  Laterality: Left;  ? SIMPLE  MASTECTOMY WITH AXILLARY SENTINEL NODE BIOPSY Left 12/01/2018  ? Procedure: LEFT SIMPLE MASTECTOMY (Procedure #2);  Surgeon: Aviva Signs, MD;  Location: [facility];  Service: General;  Laterality: Left;  ? ?History reviewed. No pertinent family history. ?Social History  ? ?Socioeconomic History  ? Marital status: Widowed  ?  Spouse name: Not on file  ? Number of children: Not on file  ? Years of education: Not on file  ? Highest education level: Not on file  ?Occupational History  ? Not on file  ?Tobacco Use  ? Smoking status: Never  ? Smokeless tobacco: Never  ?Vaping Use  ? Vaping Use: Never used  ?Substance and Sexual Activity  ? Alcohol use: Never  ? Drug use: Never  ? Sexual activity: Not on file  ?Other Topics Concern  ? Not on file  ?Social History Narrative  ? Not on file  ? ?Social Determinants of Health  ? ?Financial Resource Strain: Low Risk   ? Difficulty of Paying Living Expenses: Not hard at all  ?Food Insecurity: No Food Insecurity  ? Worried About Charity fundraiser in the Last Year: Never true  ? Ran Out of Food in the Last Year: Never true  ?Transportation Needs: No Transportation Needs  ? Lack of Transportation (Medical): No  ? Lack of Transportation (Non-Medical): No  ?Physical Activity: Insufficiently Active  ? Days of Exercise per Week: 3 days  ? Minutes of Exercise per Session: 30 min  ?Stress: No Stress Concern Present  ? Feeling of Stress : Not at all  ?Social Connections: Moderately Integrated  ? Frequency of Communication with Friends and Family: Never  ? Frequency of Social Gatherings with Friends and Family: More than three times a week  ? Attends Religious Services: More than 4 times per year  ? Active Member of Clubs or Organizations: Yes  ? Attends Archivist Meetings: More than 4 times per year  ? Marital Status: Widowed  ? ? ?Tobacco Counseling ?Counseling given: Not Answered ? ? ?Clinical Intake: ? ?Pre-visit preparation completed: Yes ? ?Pain : No/denies pain ? ?  ? ?BMI -  recorded: 31.44 ?Nutritional Status: BMI > 30  Obese ?Nutritional Risks: None ?Diabetes: No ? ?How often do you need to have someone help you when you read instructions, pamphlets, or other written materials from your doctor or pharmacy?: 5 - Always ? ?Diabetic?NO ? ?Interpreter Needed?: No ? ?Information entered by :: mj Andreya Lacks, lpn ? ? ?Activities of Daily Living ? ?  05/01/2022  ?  3:36 PM  ?In your present state of health, do you have any difficulty performing the following activities:  ?Hearing? 1  ?Vision? 0  ?Difficulty concentrating or making decisions? 1  ?Walking or climbing stairs? 0  ?Dressing or bathing? 1  ?Doing errands, shopping? 1  ?Preparing Food and eating ? Y  ?Using the Toilet? Y  ?In the past six months, have you accidently leaked urine? Y  ?Comment Incontinent  of urine  ?Do you have problems with loss of bowel control? N  ?Managing your Medications? Y  ?Managing your Finances? Y  ?Housekeeping or managing your Housekeeping? Y  ? ? ?Patient Care Team: ?Coral Spikes, DO as PCP - General (Family Medicine) ?Kassie Mends, RN as [place] Management ?[place] as Education officer, museum ? ?Indicate any recent Medical Services you may have received from other than [facility] providers in the past year (date may be approximate). ? ?   ?Assessment:  ? This is a routine wellness examination for Whitney Velez. ? ?Hearing/Vision screen ?Hearing Screening - Comments:: Some hearing issues.  ?Vision Screening - Comments:: Glasses. 2022. Does not go for eye exams due to dementia.  ? ?Dietary issues and exercise activities discussed: ?Current Exercise Habits: Structured exercise class, Type of exercise: walking, Time (Minutes): 20, Frequency (Times/Week): 3, Weekly Exercise (Minutes/Week): 60, Intensity: Mild, Exercise limited by: cardiac condition(s);neurologic condition(s) ? ? Goals Addressed   ? ?  ?  ?  ?  ? This Visit's Progress  ?  Exercise 3x per week (30 min per time)     ?  Daughter,  Whitney Velez, would like to keep her mother at home for as long as possible. ?Continue to take pt to [facility] for exercise and fellowship.  ?  ? ?  ? ?Depression Screen ? ?  05/01/2022  ?  3:31 PM 09/11/2021  ? 10:47 AM

## 2022-05-02 ENCOUNTER — Telehealth: Payer: Self-pay

## 2022-05-02 NOTE — Telephone Encounter (Signed)
? ?  Telephone encounter was:  Unsuccessful.  05/02/2022 ?Name: Whitney Velez MRN: [id number] DOB: [birth year] ? ?Unsuccessful outbound call made today to assist with:  Home Modifications ? ?Outreach Attempt:  1st Attempt ? ?A HIPAA compliant voice message was left requesting a return call.  Instructed patient to call back at earliest convenience. ? ? ? ? ?Larena Sox ?Care Guide, Embedded Care Coordination ?[place], Care Management  ?[phone] ?[address], [place], [place] [address] ?Phone: [phone] ?Email: [name] ? ?  ?

## 2022-05-04 ENCOUNTER — Telehealth: Payer: Self-pay

## 2022-05-04 NOTE — Telephone Encounter (Signed)
? ?  Telephone encounter was:  Successful.  ?05/04/2022 ?Name: Whitney Velez MRN: [id number] DOB: 20-Jul-1950 ? ?Whitney Velez is a 72 y.o. year old female who is a primary care patient of Coral Spikes, DO . The community resource team was consulted for assistance with  RAMP ? ?Care guide performed the following interventions: Patient provided with information about care guide support team and interviewed to confirm resource needs. ? ?Follow Up Plan: No follow up needed ? ? ? ?Larena Sox ?Care Guide, Embedded Care Coordination ?[place], Care Management  ?[phone] ?[address], [place], [place] [address] ?Phone: [phone] ?Email: [name] ? ?  ?

## 2022-05-15 ENCOUNTER — Telehealth: Payer: Self-pay

## 2022-05-15 NOTE — Telephone Encounter (Signed)
Caller name:Mazal Rake  ? ?On DPR? :Yes ? ?Call back number:[phone] ? ?Provider they see: Lacinda Axon  ? ?Reason for call:Adult daycare form need to be completed placed in Dr Lacinda Axon folder  ? ?

## 2022-05-15 NOTE — Telephone Encounter (Signed)
Form in folder

## 2022-05-18 NOTE — Telephone Encounter (Signed)
Form signed by provider. Med list attached. Pt daughter contacted and pt daughter will come by to pick it up.

## 2022-05-19 DIAGNOSIS — R296 Repeated falls: Secondary | ICD-10-CM | POA: Diagnosis not present

## 2022-05-30 ENCOUNTER — Encounter: Payer: Self-pay | Admitting: Family Medicine

## 2022-05-31 ENCOUNTER — Encounter: Payer: Self-pay | Admitting: Family Medicine

## 2022-06-06 ENCOUNTER — Other Ambulatory Visit: Payer: Self-pay | Admitting: Family Medicine

## 2022-06-06 ENCOUNTER — Ambulatory Visit (HOSPITAL_COMMUNITY)
Admission: RE | Admit: 2022-06-06 | Discharge: 2022-06-06 | Disposition: A | Discharge status: Home / Self Care | Payer: Medicare Other | Source: Ambulatory Visit | Attending: Family Medicine | Admitting: Family Medicine

## 2022-06-06 ENCOUNTER — Ambulatory Visit (INDEPENDENT_AMBULATORY_CARE_PROVIDER_SITE_OTHER): Payer: Medicare Other | Admitting: Family Medicine

## 2022-06-06 VITALS — BP 158/92 | HR 67 | Temp 97.7°F | Ht 60.0 in | Wt 167.0 lb

## 2022-06-06 DIAGNOSIS — R0602 Shortness of breath: Secondary | ICD-10-CM

## 2022-06-06 DIAGNOSIS — N183 Chronic kidney disease, stage 3 unspecified: Secondary | ICD-10-CM | POA: Diagnosis not present

## 2022-06-06 DIAGNOSIS — D649 Anemia, unspecified: Secondary | ICD-10-CM

## 2022-06-06 DIAGNOSIS — I517 Cardiomegaly: Secondary | ICD-10-CM

## 2022-06-06 NOTE — Patient Instructions (Addendum)
Lab and xray for further evaluation.  We will call with results.  Take care  Dr. Lacinda Axon

## 2022-06-07 DIAGNOSIS — R0602 Shortness of breath: Secondary | ICD-10-CM | POA: Insufficient documentation

## 2022-06-07 NOTE — Progress Notes (Signed)
Subjective:  Patient ID: Whitney Velez, female    DOB: 14-Mar-1950  Age: 72 y.o. MRN: [id number]  CC: Chief Complaint  Patient presents with   evaluate for SOB with activity    Goes to day center during the day , using albuterol for sob     HPI:  72 year old female presents for evaluation of the above.  Recently at her day program, they have noticed that she has been more short of breath and seems to be fatigued.  Daughter has noticed that she has been fatigued at home but has not noticed any shortness of breath.  Seems to be more with exertion.  She has used albuterol without resolution.  No reports of chest pain.  History is limited due to the fact that she has underlying dementia.  This is out of the norm for her.  Patient Active Problem List   Diagnosis Date Noted   SOB (shortness of breath) 06/07/2022   CKD (chronic kidney disease) stage 3, GFR 30-59 ml/min (HCC) 01/03/2022   Urinary incontinence 07/11/2021   Falls frequently 07/11/2021   Dementia ([place]) 04/24/2021   Osteopenia 05/08/2019   S/P bilateral mastectomy 12/01/2018   Invasive ductal carcinoma of breast, female, right ([place])    Ductal carcinoma in situ (DCIS) of left breast    Goals of care, counseling/discussion 05/29/2018   Allergic rhinitis 02/20/2015   Essential hypertension, benign 12/16/2013    Social Hx   Social History   Socioeconomic History   Marital status: Widowed    Spouse name: Not on file   Number of children: Not on file   Years of education: Not on file   Highest education level: Not on file  Occupational History   Not on file  Tobacco Use   Smoking status: Never   Smokeless tobacco: Never  Vaping Use   Vaping Use: Never used  Substance and Sexual Activity   Alcohol use: Never   Drug use: Never   Sexual activity: Not on file  Other Topics Concern   Not on file  Social History Narrative   Not on file   Social Determinants of Health   Financial Resource Strain: Low Risk   (05/01/2022)   Overall Financial Resource Strain (CARDIA)    Difficulty of Paying Living Expenses: Not hard at all  Food Insecurity: No Food Insecurity (05/01/2022)   Hunger Vital Sign    Worried About Running Out of Food in the Last Year: Never true    [place] in the Last Year: Never true  Transportation Needs: No Transportation Needs (05/01/2022)   PRAPARE - Hydrologist (Medical): No    Lack of Transportation (Non-Medical): No  Physical Activity: Insufficiently Active (05/01/2022)   Exercise Vital Sign    Days of Exercise per Week: 3 days    Minutes of Exercise per Session: 30 min  Stress: No Stress Concern Present (05/01/2022)   [place]    Feeling of Stress : Not at all  Social Connections: Moderately Integrated (05/01/2022)   Social Connection and Isolation Panel [NHANES]    Frequency of Communication with Friends and Family: Never    Frequency of Social Gatherings with Friends and Family: More than three times a week    Attends Religious Services: More than 4 times per year    Active Member of [employer] or Organizations: Yes    Attends Archivist Meetings: More than 4 times  per year    Marital Status: Widowed    Review of Systems Per HPI  Objective:  BP (!) 158/92   Pulse 67   Temp 97.7 F (36.5 C)   Ht 5' (1.524 m)   Wt 167 lb (75.8 kg)   SpO2 96%   BMI 32.61 kg/m      06/06/2022    2:52 PM 05/01/2022    3:24 PM 04/11/2022    1:57 PM  BP/Weight  Systolic BP 670  141  Diastolic BP 92  83  Wt. (Lbs) 167 161 161.2  BMI 32.61 kg/m2 31.44 kg/m2 31.48 kg/m2    Physical Exam Vitals and nursing note reviewed.  Constitutional:      Appearance: Normal appearance. She is obese.  HENT:     Head: Normocephalic and atraumatic.  Cardiovascular:     Rate and Rhythm: Normal rate and regular rhythm.  Pulmonary:     Effort: Pulmonary effort is normal.     Breath sounds:  Normal breath sounds. No wheezing, rhonchi or rales.  Neurological:     Mental Status: She is alert.     Lab Results  Component Value Date   WBC 10.5 06/06/2022   HGB 12.1 06/06/2022   HCT 36.6 06/06/2022   PLT 303 06/06/2022   GLUCOSE 81 06/06/2022   CHOL 211 (H) 03/28/2020   TRIG 81 03/28/2020   HDL 65 03/28/2020   LDLCALC 132 (H) 03/28/2020   ALT 27 06/06/2022   AST 36 06/06/2022   NA 137 06/06/2022   K 3.9 06/06/2022   CL 97 06/06/2022   CREATININE 1.07 (H) 06/06/2022   BUN 19 06/06/2022   CO2 23 06/06/2022   TSH 1.120 01/26/2021   INR 1.1 11/03/2007   HGBA1C 5.5 03/28/2020   MICROALBUR 2.30 (H) 07/20/2014     Assessment & Plan:   Problem List Items Addressed This Visit       Genitourinary   CKD (chronic kidney disease) stage 3, GFR 30-59 ml/min (HCC)   Relevant Orders   CMP14+EGFR (Completed)     Other   SOB (shortness of breath) - Primary    Labs and chest x-ray performed.  Awaiting BNP.  X-ray revealed cardiomegaly.  Sending for echocardiogram.      Relevant Orders   CBC (Completed)   B Nat Peptide (Completed)   DG Chest 2 View (Completed)   Other Visit Diagnoses     Anemia, unspecified type       Relevant Orders   Iron, TIBC and Ferritin Panel (Completed)   Folate (Completed)   Vitamin B12 (Completed)      Lattie Riege Lacinda Axon DO [place]

## 2022-06-07 NOTE — Assessment & Plan Note (Signed)
Labs and chest x-ray performed.  Awaiting BNP.  X-ray revealed cardiomegaly.  Sending for echocardiogram.

## 2022-06-11 ENCOUNTER — Ambulatory Visit (HOSPITAL_COMMUNITY)
Admission: RE | Admit: 2022-06-11 | Discharge: 2022-06-11 | Disposition: A | Discharge status: Home / Self Care | Payer: Medicare Other | Source: Ambulatory Visit | Attending: Family Medicine | Admitting: Family Medicine

## 2022-06-11 DIAGNOSIS — N189 Chronic kidney disease, unspecified: Secondary | ICD-10-CM | POA: Insufficient documentation

## 2022-06-11 DIAGNOSIS — I129 Hypertensive chronic kidney disease with stage 1 through stage 4 chronic kidney disease, or unspecified chronic kidney disease: Secondary | ICD-10-CM | POA: Diagnosis not present

## 2022-06-11 DIAGNOSIS — Z853 Personal history of malignant neoplasm of breast: Secondary | ICD-10-CM | POA: Diagnosis not present

## 2022-06-11 DIAGNOSIS — I517 Cardiomegaly: Secondary | ICD-10-CM | POA: Insufficient documentation

## 2022-06-11 LAB — ECHOCARDIOGRAM COMPLETE
AV Mean grad: 8 mmHg
AV Peak grad: 13.2 mmHg
Ao pk vel: 1.82 m/s
Area-P 1/2: 1.55 cm2
S' Lateral: 2.2 cm

## 2022-06-11 NOTE — Progress Notes (Signed)
  Echocardiogram 2D Echocardiogram has been performed.  Darlina Sicilian M 06/11/2022, 1:41 PM

## 2022-06-12 ENCOUNTER — Other Ambulatory Visit: Payer: Self-pay | Admitting: *Deleted

## 2022-06-12 DIAGNOSIS — R0602 Shortness of breath: Secondary | ICD-10-CM

## 2022-06-12 DIAGNOSIS — I517 Cardiomegaly: Secondary | ICD-10-CM

## 2022-06-12 DIAGNOSIS — I5189 Other ill-defined heart diseases: Secondary | ICD-10-CM

## 2022-06-12 LAB — CMP14+EGFR
ALT: 27 IU/L (ref 0–32)
AST: 36 IU/L (ref 0–40)
Albumin/Globulin Ratio: 1.2 (ref 1.2–2.2)
Albumin: 4.4 g/dL (ref 3.7–4.7)
Alkaline Phosphatase: 92 IU/L (ref 44–121)
BUN/Creatinine Ratio: 18 (ref 12–28)
BUN: 19 mg/dL (ref 8–27)
Bilirubin Total: 0.2 mg/dL (ref 0.0–1.2)
CO2: 23 mmol/L (ref 20–29)
Calcium: 9.9 mg/dL (ref 8.7–10.3)
Chloride: 97 mmol/L (ref 96–106)
Creatinine, Ser: 1.07 mg/dL — ABNORMAL HIGH (ref 0.57–1.00)
Globulin, Total: 3.8 g/dL (ref 1.5–4.5)
Glucose: 81 mg/dL (ref 70–99)
Potassium: 3.9 mmol/L (ref 3.5–5.2)
Sodium: 137 mmol/L (ref 134–144)
Total Protein: 8.2 g/dL (ref 6.0–8.5)
eGFR: 55 mL/min/{1.73_m2} — ABNORMAL LOW (ref >59)

## 2022-06-12 LAB — IRON,TIBC AND FERRITIN PANEL
Ferritin: 90 ng/mL (ref 15–150)
Iron Saturation: 13 % — ABNORMAL LOW (ref 15–55)
Iron: 52 ug/dL (ref 27–139)
Total Iron Binding Capacity: 390 ug/dL (ref 250–450)
UIBC: 338 ug/dL (ref 118–369)

## 2022-06-12 LAB — CBC
Hematocrit: 36.6 % (ref 34.0–46.6)
Hemoglobin: 12.1 g/dL (ref 11.1–15.9)
MCH: 27.8 pg (ref 26.6–33.0)
MCHC: 33.1 g/dL (ref 31.5–35.7)
MCV: 84 fL (ref 79–97)
Platelets: 303 10*3/uL (ref 150–450)
RBC: 4.36 x10E6/uL (ref 3.77–5.28)
RDW: 17 % — ABNORMAL HIGH (ref 11.7–15.4)
WBC: 10.5 10*3/uL (ref 3.4–10.8)

## 2022-06-12 LAB — VITAMIN B12: Vitamin B-12: 544 pg/mL (ref 232–1245)

## 2022-06-12 LAB — BRAIN NATRIURETIC PEPTIDE: BNP: 18.1 pg/mL (ref 0.0–100.0)

## 2022-06-12 LAB — FOLATE: Folate: 8.8 ng/mL (ref >3.0)

## 2022-06-13 ENCOUNTER — Other Ambulatory Visit: Payer: Self-pay | Admitting: Family Medicine

## 2022-06-19 DIAGNOSIS — R296 Repeated falls: Secondary | ICD-10-CM | POA: Diagnosis not present

## 2022-06-27 ENCOUNTER — Ambulatory Visit (INDEPENDENT_AMBULATORY_CARE_PROVIDER_SITE_OTHER): Payer: Medicare Other | Admitting: *Deleted

## 2022-06-27 NOTE — Chronic Care Management (AMB) (Signed)
Chronic Care Management   CCM RN Visit Note  06/27/2022 Name: Whitney Velez MRN: [id number] DOB: June 22, 1950  Subjective: Whitney Velez is a 72 y.o. year old female who is a primary care patient of Coral Spikes, DO. The care management team was consulted for assistance with disease management and care coordination needs.    Engaged with patient by telephone for follow up visit in response to provider referral for case management and/or care coordination services.   Consent to Services:  The patient was given information about Chronic Care Management services, agreed to services, and gave verbal consent prior to initiation of services.  Please see initial visit note for detailed documentation.   Patient agreed to services and verbal consent obtained.   Assessment: Review of patient past medical history, allergies, medications, health status, including review of consultants reports, laboratory and other test data, was performed as part of comprehensive evaluation and provision of chronic care management services.   SDOH (Social Determinants of Health) assessments and interventions performed:    CCM Care Plan  Allergies  Allergen Reactions   Penicillins Other (See Comments)    Nausea, vomiting, dizziness Has patient had a PCN reaction causing immediate rash, facial/tongue/throat swelling, SOB or lightheadedness with hypotension: no Has patient had a PCN reaction causing severe rash involving mucus membranes or skin necrosis: no Has patient had a PCN reaction that required hospitalization: no Has patient had a PCN reaction occurring within the last 10 years: no If all of the above answers are "NO", then may proceed with Cephalosporin use.    Percocet [Oxycodone-Acetaminophen] Diarrhea and Nausea And Vomiting   Ranitidine Itching    Outpatient Encounter Medications as of 06/27/2022  Medication Sig   albuterol (VENTOLIN HFA) 108 (90 Base) MCG/ACT inhaler INHALE TWO PUFFS INTO THE  LUNGS EVERY SIX HOURS AS NEEDED FOR WHEEZING   anastrozole (ARIMIDEX) 1 MG tablet TAKE ONE (1) TABLET BY MOUTH EVERY DAY   aspirin 81 MG tablet Take 81 mg by mouth daily.   Calcium Carb-Cholecalciferol (CALCIUM + VITAMIN D3 PO) Take 1 tablet by mouth 2 (two) times daily.   cetirizine (ZYRTEC) 10 MG tablet Take 10 mg by mouth daily.   denosumab (PROLIA) 60 MG/ML SOSY injection Inject 60 mg into the skin every 6 (six) months.   donepezil (ARICEPT) 10 MG tablet Take 10 mg by mouth daily.   ibuprofen (ADVIL) 200 MG tablet Take 200 mg by mouth at bedtime as needed.   memantine (NAMENDA) 10 MG tablet Take 10 mg by mouth 2 (two) times daily.   montelukast (SINGULAIR) 10 MG tablet TAKE ONE TABLET (10MG TOTAL) BY MOUTH ATBEDTIME   OLANZapine (ZYPREXA) 7.5 MG tablet Take 7.5 mg by mouth at bedtime.   OVER THE COUNTER MEDICATION Stool softner once daily   potassium chloride (KLOR-CON) 10 MEQ tablet TAKE TWO (2) TABLETS BY MOUTH DAILY   sertraline (ZOLOFT) 100 MG tablet Take 150 mg by mouth daily.   Dextromethorphan-quiNIDine (NUEDEXTA) 20-10 MG capsule Nuedexta 20 mg-10 mg capsule  Take 1 capsule every 12 hours by oral route.  1 daily for 1 month and then increase to 1 twice a day (Patient not taking: Reported on 06/06/2022)   No facility-administered encounter medications on file as of 06/27/2022.    Patient Active Problem List   Diagnosis Date Noted   SOB (shortness of breath) 06/07/2022   CKD (chronic kidney disease) stage 3, GFR 30-59 ml/min (HCC) 01/03/2022   Urinary incontinence 07/11/2021   Falls  frequently 07/11/2021   Dementia ([place]) 04/24/2021   Osteopenia 05/08/2019   S/P bilateral mastectomy 12/01/2018   Invasive ductal carcinoma of breast, female, right ([place])    Ductal carcinoma in situ (DCIS) of left breast    Goals of care, counseling/discussion 05/29/2018   Allergic rhinitis 02/20/2015   Essential hypertension, benign 12/16/2013    Conditions to be addressed/monitored:HTN and  Dementia  Care Plan : RN Care Manager Plan of Care  Updates made by Kassie Mends, RN since 06/27/2022 12:00 AM     Problem: No plan of care established for management of chronic disease states  (HTN, dementia)   Priority: High     Long-Range Goal: Development of plan of care for chronic disease management  (HTN, Dementia)   Start Date: 12/20/2021  Expected End Date: 10/01/2022  Priority: High  Note:   Current Barriers:  Knowledge Deficits related to plan of care for management of HTN and Dementia  Ineffective Self Health Maintenance in a patient with dementia- patient lives with her daughter [name], son in law and grandson, attends adult day care 4 x per week ([facility]), per daughter- patient's condition related to dementia, weakness, oral intake, mood etc has improved over the past few weeks, sometimes pt does not recognize family members and wanders at times, paranoia, crying and having suspicions has improved, sleeping better, pt is taking lorazepam and this has helped sleep some, daughter is no longer considering her options for long term care planning as patient's behavior has improved, pt has necessary DME in the home, daughter is working with LCSW for any long term care planning/ resources/ guidance. Patient's son is not interested in palliative care/ hospice at this time.  Daughter reports keeping pt safe is number one priority. Patient does not check blood sugar and is on no medication for diabetes and last AIC <6.   Knowledge Deficits related to basic understanding of hypertension pathophysiology and self care management- blood pressure is checked several times per week, daughter reports she tries to encourage healthy diet, pt does not ask for food, if food is not offered pt would most likely not eat.  Patient is a fall risk per daughter, patient has thigh high compression hose to help with blood pressure but refuses to wear them. Patient did complete 18 outpatient visits for PT.   Daughter reports family is having more oversight over patient and assisting her more if ambulating, etc.  Patient saw primary care provider due to having some hypotensive episodes and amlodipine discontinued and daughter reports "this helped and we have had no more low blood pressures" Unable to self administer medications as prescribed- daughter provides complete oversight over medications Unable to perform ADLs independently- patient is completely dependent on caregivers for assistance with bathing, cooking, pt is incontinent. Unable to perform IADLs independently- completely dependent 06/27/22 Update- per daughter [name] pt has had episodes of dyspnea and followed up with primary care provider, had echocardiogram and referral for cardiology is in the works, blood pressure has been within normal limits when checked from home, pt no longer taking nudexta due to costs of 512$ per month and pt cannot afford, [name] states she checked with [employer] and several other programs but cannot get any help, [name] would like assistance of [facility] pharmacist. [name] states she can tell a difference in patient's mood since pt is not taking nudexta and feels pt should continue taking.  RNCM Clinical Goal(s):  Patient will verbalize understanding of plan for management of  HTN and Dementia as evidenced by caregiver report, review of EHR and  through collaboration with RN Care manager, provider, and care team.   Interventions: 1:1 collaboration with primary care provider regarding development and update of comprehensive plan of care as evidenced by provider attestation and co-signature Inter-disciplinary care team collaboration (see longitudinal plan of care) Evaluation of current treatment plan related to  self management and patient's adherence to plan as established by provider   Hypertension Interventions:  (Status:  Goal on track:  Yes.) Long Term Goal Last practice recorded BP readings:  BP  Readings from Last 3 Encounters:  09/11/21 136/86  07/11/21 (!) 158/79  07/08/21 114/64  Most recent eGFR/CrCl: No results found for: EGFR  No components found for: CRCL  Reviewed medications with patient and discussed importance of compliance Discussed plans with patient for ongoing care management follow up and provided patient with direct contact information for care management team Discussed complications of poorly controlled blood pressure such as heart disease, stroke, circulatory complications, vision complications, kidney impairment, sexual dysfunction Reinforced importance of continuing to check blood pressure regularly and report any low or high blood pressures to doctor Reviewed low sodium diet  Dementia:  (Status:  Goal on track:  Yes.)  Long Term Goal Evaluation of current treatment plan related to misuse of: Dementia with agitation and with behavioral disturbance Emotional Support Provided to patient/caregiver, Sleep hygiene recommendations and education provided, Discussed importance of discussing diagnosis with provider, Discussed importance of attendance to all provider appointments, and Advised to contact provider for new or worsening symptoms  Reviewed safety precautions Referral for [facility] central pharmacy team placed via collaboration with assigned care guide (for assistance with nudexta)  Patient Goals/Self-Care Activities: Take medications as prescribed   Attend all scheduled provider appointments Call pharmacy for medication refills 3-7 days in advance of running out of medications Call provider office for new concerns or questions  Work with the social worker to address care coordination needs and will continue to work with the clinical team to address health care and disease management related needs check blood pressure 3 times per week keep a blood pressure log keep all doctor appointments take medications for blood pressure exactly as prescribed report new  symptoms to your doctor eat more whole grains, fruits and vegetables, lean meats and healthy fats Drink adequate fluids Continue offering high calorie meals and snacks Practice safety precautions fall prevention strategies: change position slowly, use assistive device such as walker or cane (per provider recommendations) when walking, keep walkways clear, have good lighting in room. It is important to contact your provider if you have any falls, maintain muscle strength/tone by exercise per provider recomme tips for everyday care of patients with dementia: keep a routine, such as bathing dressing eating at the same time each day, plan activities that the person enjoys and try to do them at the same time each day, serve meals in a consistent, familiar place and give patient enough time to eat, encourage use of loose-fitting, comfortable, easy to use clothing such as, elastic waistbands, fabric fasteners, or large zipper pulls instead of shoelaces, buttons, or buckles. Call or message your primary doctor in one week if you have not heard from the cardiology referral Expect a call from pharmacist about assistance with nudexta       Plan:Telephone follow up appointment with care management team member scheduled for:  08/01/22 at 130 pm Follow up on cardiology referral/ appointment/ follow up pharmacist cost of nudexta  Almyra Free  Lisette Grinder, BSN RN Case Manager [place] [phone]

## 2022-06-27 NOTE — Patient Instructions (Signed)
Visit Information  Thank you for taking time to visit with me today. Please don't hesitate to contact me if I can be of assistance to you before our next scheduled telephone appointment.  Following are the goals we discussed today:  Take medications as prescribed   Attend all scheduled provider appointments Call pharmacy for medication refills 3-7 days in advance of running out of medications Call provider office for new concerns or questions  Work with the social worker to address care coordination needs and will continue to work with the clinical team to address health care and disease management related needs check blood pressure 3 times per week keep a blood pressure log keep all doctor appointments take medications for blood pressure exactly as prescribed report new symptoms to your doctor eat more whole grains, fruits and vegetables, lean meats and healthy fats Drink adequate fluids Continue offering high calorie meals and snacks Practice safety precautions fall prevention strategies: change position slowly, use assistive device such as walker or cane (per provider recommendations) when walking, keep walkways clear, have good lighting in room. It is important to contact your provider if you have any falls, maintain muscle strength/tone by exercise per provider recomme tips for everyday care of patients with dementia: keep a routine, such as bathing dressing eating at the same time each day, plan activities that the person enjoys and try to do them at the same time each day, serve meals in a consistent, familiar place and give patient enough time to eat, encourage use of loose-fitting, comfortable, easy to use clothing such as, elastic waistbands, fabric fasteners, or large zipper pulls instead of shoelaces, buttons, or buckles. Call or message your primary doctor in one week if you have not heard from the cardiology referral Expect a call from pharmacist about assistance with  nudexta  Our next appointment is by telephone on 08/01/22 at 130 pm  Please call the care guide team at [phone] if you need to cancel or reschedule your appointment.   If you are experiencing a Mental Health or [place] or need someone to talk to, please call the Suicide and Crisis Lifeline: 988 call the [place] National Suicide Prevention Lifeline: (231)223-0978 or TTY: 323-881-0936 TTY 504-329-3953) to talk to a trained counselor call 1-800-273-TALK (toll free, 24 hour hotline) go to [facility] [address], [place] [phone]) call the [place]: [phone] call 911   Patient verbalizes understanding of instructions and care plan provided today and agrees to view in [place]. Active MyChart status and patient understanding of how to access instructions and care plan via MyChart confirmed with patient.     Jacqlyn Larsen [facility], BSN RN Case Manager [facility] [phone]

## 2022-06-28 NOTE — Progress Notes (Signed)
Whitney Velez routed to [place] Management  Direct Dial: [phone]

## 2022-06-29 DIAGNOSIS — F03C3 Unspecified dementia, severe, with mood disturbance: Secondary | ICD-10-CM

## 2022-06-29 DIAGNOSIS — I10 Essential (primary) hypertension: Secondary | ICD-10-CM

## 2022-07-03 ENCOUNTER — Encounter: Payer: Self-pay | Admitting: Family Medicine

## 2022-07-10 ENCOUNTER — Telehealth: Payer: Self-pay

## 2022-07-10 DIAGNOSIS — Z596 Low income: Secondary | ICD-10-CM

## 2022-07-10 NOTE — Progress Notes (Signed)
[facility] [facility])  [place] Team    07/10/2022  Whitney Velez 1950-11-02 [id number]  Reason for referral: Medication Assistance  Referral source: [facility] RN Current insurance: [facility]  PMHx includes but not limited to:  ***  Outreach:  Successful telephone call with Marshell Garfinkel (patient's daughter).  HIPAA identifiers verified.   Subjective:  Ms.Frett needs medication assistance for Nuedexta. Patient was previously on Nuedexta this year, but was discontinued due to cost. This pharmacist calld Dr. Ames Dura office to confirm if appropriate to resume therapy and directions. Clinical staff noted provided confirmed patient is able to resume therapy and will be on Nuedexta 10 mg once daily for 7 days, then 10 mg every 12 hours there after.   Of note, per manufacturer, use caution or avoid use as potentially inappropriate in older adults. Per patient's daughter, this medication is the only medication that worked for her mother which is why she wants her back on therapy. Provide education on side effects such as peripheral edema, diarrhea, vomiting, dizziness related to medication and cautionary warning. Nuedexta can cause QT prolongation, hepatitis, immune thrombocytopenia, or systemic lupus erythematosus.    Objective: The 10-year ASCVD risk score (Arnett DK, et al., 2019) is: 46.9%   Values used to calculate the score:     Age: 1 years     Sex: Female     Is Non-Hispanic African American: Yes     Diabetic: Yes     Tobacco smoker: No     Systolic Blood Pressure: 497 mmHg     Is BP treated: No     HDL Cholesterol: 65 mg/dL     Total Cholesterol: 211 mg/dL  Lab Results  Component Value Date   CREATININE 1.07 (H) 06/06/2022   CREATININE 1.12 (H) 03/14/2022   CREATININE 1.20 (H) 09/13/2021    Lab Results  Component Value Date   HGBA1C 5.5 03/28/2020    Lipid Panel     Component Value Date/Time   CHOL 211 (H) 03/28/2020 0940    TRIG 81 03/28/2020 0940   HDL 65 03/28/2020 0940   CHOLHDL 3.2 03/28/2020 0940   CHOLHDL 3.5 07/20/2014 0727   VLDL 31 07/20/2014 0727   LDLCALC 132 (H) 03/28/2020 0940    BP Readings from Last 3 Encounters:  06/06/22 (!) 158/92  04/11/22 140/83  03/14/22 (!) 155/73    Allergies  Allergen Reactions   Penicillins Other (See Comments)    Nausea, vomiting, dizziness Has patient had a PCN reaction causing immediate rash, facial/tongue/throat swelling, SOB or lightheadedness with hypotension: no Has patient had a PCN reaction causing severe rash involving mucus membranes or skin necrosis: no Has patient had a PCN reaction that required hospitalization: no Has patient had a PCN reaction occurring within the last 10 years: no If all of the above answers are "NO", then may proceed with Cephalosporin use.    Percocet [Oxycodone-Acetaminophen] Diarrhea and Nausea And Vomiting   Ranitidine Itching    Medications Reviewed Today     Reviewed by Kassie Mends, RN (Registered Nurse) on 06/27/22 at [phone]  Med List Status: <None>   Medication Order Taking? Sig Documenting Provider Last Dose Status Informant  albuterol (VENTOLIN HFA) 108 (90 Base) MCG/ACT inhaler [id number] Yes INHALE TWO PUFFS INTO THE LUNGS EVERY SIX HOURS AS NEEDED FOR WHEEZING Lovena Le, Malena M, DO Taking Active   anastrozole (ARIMIDEX) 1 MG tablet [id number] Yes TAKE ONE (1) TABLET BY MOUTH EVERY DAY Derek Jack, MD  Taking Active   aspirin 81 MG tablet [id number] Yes Take 81 mg by mouth daily. [provider] Taking Active Family Member  Calcium Carb-Cholecalciferol (CALCIUM + VITAMIN D3 PO) [id number] Yes Take 1 tablet by mouth 2 (two) times daily. [provider] Taking Active Family Member  cetirizine (ZYRTEC) 10 MG tablet [id number] Yes Take 10 mg by mouth daily. [provider] Taking Active   denosumab (PROLIA) 60 MG/ML SOSY injection [id number] Yes Inject 60 mg into the skin every 6  (six) months. [provider] Taking Active   Dextromethorphan-quiNIDine (NUEDEXTA) 20-10 MG capsule [id number] No Nuedexta 20 mg-10 mg capsule  Take 1 capsule every 12 hours by oral route.  1 daily for 1 month and then increase to 1 twice a day  Patient not taking: Reported on 06/06/2022   [provider] Not Taking Active   donepezil (ARICEPT) 10 MG tablet [id number] Yes Take 10 mg by mouth daily. [provider] Taking Active   ibuprofen (ADVIL) 200 MG tablet [id number] Yes Take 200 mg by mouth at bedtime as needed. [provider] Taking Active Self  memantine (NAMENDA) 10 MG tablet [id number] Yes Take 10 mg by mouth 2 (two) times daily. [provider] Taking Active   montelukast (SINGULAIR) 10 MG tablet [id number] Yes TAKE ONE TABLET (10MG TOTAL) BY MOUTH ATBEDTIME Coral Spikes, DO Taking Active   OLANZapine (ZYPREXA) 7.5 MG tablet [id number] Yes Take 7.5 mg by mouth at bedtime. [provider] Taking Active   OVER THE COUNTER MEDICATION [id number] Yes Stool softner once daily [provider] Taking Active   potassium chloride (KLOR-CON) 10 MEQ tablet [id number] Yes TAKE TWO (2) TABLETS BY MOUTH DAILY Derek Jack, MD Taking Active   sertraline (ZOLOFT) 100 MG tablet [id number] Yes Take 150 mg by mouth daily. [provider] Taking Active             Assessment:  Medication Assistance Findings:  Medication assistance needs identified: Nuedexta    Patient Assistance Programs: Nuedexta  made by [employer] requirement met: Yes Out-of-pocket prescription expenditure met:   Not Applicable Reviewed program requirements with patient.    Plan: I will route patient assistance letter to [place] technician who will coordinate patient assistance program application process for medications listed above.  [facility] pharmacy technician will assist with obtaining all required documents from both patient and provider(s)  and submit application(s) once completed.   Thank you for allowing pharmacy to be a part of this patient's care. Kristeen Miss, PharmD Clinical Pharmacist [place] Cell: [phone]

## 2022-07-11 DIAGNOSIS — G309 Alzheimer's disease, unspecified: Secondary | ICD-10-CM | POA: Diagnosis not present

## 2022-07-11 DIAGNOSIS — F482 Pseudobulbar affect: Secondary | ICD-10-CM | POA: Diagnosis not present

## 2022-07-11 DIAGNOSIS — I951 Orthostatic hypotension: Secondary | ICD-10-CM | POA: Diagnosis not present

## 2022-07-11 DIAGNOSIS — R2689 Other abnormalities of gait and mobility: Secondary | ICD-10-CM | POA: Diagnosis not present

## 2022-07-18 ENCOUNTER — Telehealth: Payer: Self-pay | Admitting: Pharmacy Technician

## 2022-07-18 DIAGNOSIS — Z596 Low income: Secondary | ICD-10-CM

## 2022-07-18 NOTE — Progress Notes (Signed)
[place] [facility])                                            [place] Team    07/18/2022  Whitney Velez 1950/03/19 [id number]                                      Medication Assistance Referral  Referral From:  [facility] Whitney Velez  Medication/Company: [employer] / Elson Clan Patient application portion:  Mailed Provider application portion: Faxed  to Dr. Phillips Odor Provider address/fax verified via: Office website   Whitney Velez, [place]  [phone]

## 2022-07-19 DIAGNOSIS — R296 Repeated falls: Secondary | ICD-10-CM | POA: Diagnosis not present

## 2022-08-01 ENCOUNTER — Ambulatory Visit (INDEPENDENT_AMBULATORY_CARE_PROVIDER_SITE_OTHER): Payer: Medicare Other | Admitting: *Deleted

## 2022-08-01 DIAGNOSIS — F03C3 Unspecified dementia, severe, with mood disturbance: Secondary | ICD-10-CM

## 2022-08-01 DIAGNOSIS — I10 Essential (primary) hypertension: Secondary | ICD-10-CM

## 2022-08-01 NOTE — Chronic Care Management (AMB) (Signed)
Chronic Care Management   CCM RN Visit Note  08/01/2022 Name: Whitney Velez MRN: [id number] DOB: 08/13/1950  Subjective: Whitney Velez is a 72 y.o. year old female who is a primary care patient of Coral Spikes, DO. The care management team was consulted for assistance with disease management and care coordination needs.    Engaged with patient by telephone for follow up visit in response to provider referral for case management and/or care coordination services.   Consent to Services:  The patient was given information about Chronic Care Management services, agreed to services, and gave verbal consent prior to initiation of services.  Please see initial visit note for detailed documentation.   Patient agreed to services and verbal consent obtained.   Assessment: Review of patient past medical history, allergies, medications, health status, including review of consultants reports, laboratory and other test data, was performed as part of comprehensive evaluation and provision of chronic care management services.   SDOH (Social Determinants of Health) assessments and interventions performed:    CCM Care Plan  Allergies  Allergen Reactions   Penicillins Other (See Comments)    Nausea, vomiting, dizziness Has patient had a PCN reaction causing immediate rash, facial/tongue/throat swelling, SOB or lightheadedness with hypotension: no Has patient had a PCN reaction causing severe rash involving mucus membranes or skin necrosis: no Has patient had a PCN reaction that required hospitalization: no Has patient had a PCN reaction occurring within the last 10 years: no If all of the above answers are "NO", then may proceed with Cephalosporin use.    Percocet [Oxycodone-Acetaminophen] Diarrhea and Nausea And Vomiting   Ranitidine Itching    Outpatient Encounter Medications as of 08/01/2022  Medication Sig   albuterol (VENTOLIN HFA) 108 (90 Base) MCG/ACT inhaler INHALE TWO PUFFS INTO THE  LUNGS EVERY SIX HOURS AS NEEDED FOR WHEEZING   anastrozole (ARIMIDEX) 1 MG tablet TAKE ONE (1) TABLET BY MOUTH EVERY DAY   aspirin 81 MG tablet Take 81 mg by mouth daily.   Calcium Carb-Cholecalciferol (CALCIUM + VITAMIN D3 PO) Take 1 tablet by mouth 2 (two) times daily.   cetirizine (ZYRTEC) 10 MG tablet Take 10 mg by mouth daily.   denosumab (PROLIA) 60 MG/ML SOSY injection Inject 60 mg into the skin every 6 (six) months.   donepezil (ARICEPT) 10 MG tablet Take 10 mg by mouth daily.   ibuprofen (ADVIL) 200 MG tablet Take 200 mg by mouth at bedtime as needed.   memantine (NAMENDA) 10 MG tablet Take 10 mg by mouth 2 (two) times daily.   montelukast (SINGULAIR) 10 MG tablet TAKE ONE TABLET (10MG TOTAL) BY MOUTH ATBEDTIME   OLANZapine (ZYPREXA) 7.5 MG tablet Take 7.5 mg by mouth at bedtime.   OVER THE COUNTER MEDICATION Stool softner once daily   potassium chloride (KLOR-CON) 10 MEQ tablet TAKE TWO (2) TABLETS BY MOUTH DAILY   sertraline (ZOLOFT) 100 MG tablet Take 150 mg by mouth daily.   Dextromethorphan-quiNIDine (NUEDEXTA) 20-10 MG capsule Nuedexta 20 mg-10 mg capsule  Take 1 capsule every 12 hours by oral route.  1 daily for 1 month and then increase to 1 twice a day (Patient not taking: Reported on 08/01/2022)   No facility-administered encounter medications on file as of 08/01/2022.    Patient Active Problem List   Diagnosis Date Noted   SOB (shortness of breath) 06/07/2022   CKD (chronic kidney disease) stage 3, GFR 30-59 ml/min (HCC) 01/03/2022   Urinary incontinence 07/11/2021   Falls  frequently 07/11/2021   Dementia ([place]) 04/24/2021   Osteopenia 05/08/2019   S/P bilateral mastectomy 12/01/2018   Invasive ductal carcinoma of breast, female, right ([place])    Ductal carcinoma in situ (DCIS) of left breast    Goals of care, counseling/discussion 05/29/2018   Allergic rhinitis 02/20/2015   Essential hypertension, benign 12/16/2013    Conditions to be addressed/monitored:HTN and  Dementia  Care Plan : RN Care Manager Plan of Care  Updates made by Kassie Mends, RN since 08/01/2022 12:00 AM  Completed 08/01/2022   Problem: No plan of care established for management of chronic disease states  (HTN, dementia) Resolved 08/01/2022  Priority: High     Long-Range Goal: Development of plan of care for chronic disease management  (HTN, Dementia) Completed 08/01/2022  Start Date: 12/20/2021  Expected End Date: 10/01/2022  Priority: High  Note:   Current Barriers:  Knowledge Deficits related to plan of care for management of HTN and Dementia  Ineffective Self Health Maintenance in a patient with dementia- patient lives with her daughter [name], son in law and grandson, attends adult day care 4 x per week ([facility]), per daughter- patient's condition related to dementia, weakness, oral intake, mood etc has improved over the past few weeks, sometimes pt does not recognize family members and wanders at times, paranoia, crying and having suspicions has improved, sleeping better, pt is taking lorazepam and this has helped sleep some, daughter is no longer considering her options for long term care planning , pt has necessary DME in the home, daughter is working with LCSW for any long term care planning/ resources/ guidance. Patient's son is not interested in palliative care/ hospice at this time.  Daughter reports keeping pt safe is number one priority. Patient does not check blood sugar and is on no medication for diabetes and last AIC <6.   Knowledge Deficits related to basic understanding of hypertension pathophysiology and self care management- blood pressure is checked several times per week, daughter reports she tries to encourage healthy diet, pt does not ask for food, if food is not offered pt would most likely not eat.  Patient is a fall risk per daughter, patient has thigh high compression hose to help with blood pressure but refuses to wear them. Patient did complete 18 outpatient  visits for PT.  Daughter reports family is having more oversight over patient and assisting her more if ambulating, etc.  Patient saw primary care provider due to having some hypotensive episodes and amlodipine discontinued and daughter reports "this helped and we have had no more low blood pressures" Unable to self administer medications as prescribed- daughter provides complete oversight over medications Unable to perform ADLs independently- patient is completely dependent on caregivers for assistance with bathing, cooking, pt is incontinent. Unable to perform IADLs independently- completely dependent 06/27/22 Update- per daughter [name] pt has had episodes of dyspnea and followed up with primary care provider, had echocardiogram and referral for cardiology is in the works, blood pressure has been within normal limits when checked from home, pt no longer taking nudexta due to costs of 512$ per month and pt cannot afford, [name] states she checked with [employer] and several other programs but cannot get any help, [name] would like assistance of [facility] pharmacist. [name] states she can tell a difference in patient's mood since pt is not taking nudexta and feels pt should continue taking. 08/01/22- Update- per daughter [name] pt saw neurologist last week and doctor feels starting nudexta  back may not be helpful, [name] reports she is not sure if they will start this medication back, is awaiting decision/ determination about the costs.  [name] reports pt is now having some incontinence of bowels, continues adult daycare, [name] continues to be primary care giver.  RNCM Clinical Goal(s):  Patient will verbalize understanding of plan for management of HTN and Dementia as evidenced by caregiver report, review of EHR and  through collaboration with RN Care manager, provider, and care team.   Interventions: 1:1 collaboration with primary care provider regarding development and update of comprehensive  plan of care as evidenced by provider attestation and co-signature Inter-disciplinary care team collaboration (see longitudinal plan of care) Evaluation of current treatment plan related to  self management and patient's adherence to plan as established by provider   Hypertension Interventions:  (Status:  Goal on track:  Yes.) Long Term Goal Last practice recorded BP readings:  BP Readings from Last 3 Encounters:  09/11/21 136/86  07/11/21 (!) 158/79  07/08/21 114/64  Most recent eGFR/CrCl: No results found for: EGFR  No components found for: CRCL  Reviewed medications with patient and discussed importance of compliance Discussed plans with patient for ongoing care management follow up and provided patient with direct contact information for care management team Discussed complications of poorly controlled blood pressure such as heart disease, stroke, circulatory complications, vision complications, kidney impairment, sexual dysfunction Reviewed importance of continuing to check blood pressure regularly and report any low or high blood pressures to doctor Reinforced low sodium diet Reviewed plan of care today including decision made by daughter to discontinue RN care management  Dementia:  (Status:  Goal on track:  Yes.)  Long Term Goal Evaluation of current treatment plan related to misuse of: Dementia with agitation and with behavioral disturbance Emotional Support Provided to patient/caregiver, Sleep hygiene recommendations and education provided, Discussed importance of discussing diagnosis with provider, Discussed importance of attendance to all provider appointments, and Advised to contact provider for new or worsening symptoms  Reinforced safety precautions Instructed to keep patient clean and dry Instructed to change positions q 2 hours  Patient Goals/Self-Care Activities: Take medications as prescribed   Attend all scheduled provider appointments Call pharmacy for medication  refills 3-7 days in advance of running out of medications Call provider office for new concerns or questions  Work with the social worker to address care coordination needs and will continue to work with the clinical team to address health care and disease management related needs check blood pressure 3 times per week keep a blood pressure log keep all doctor appointments take medications for blood pressure exactly as prescribed report new symptoms to your doctor eat more whole grains, fruits and vegetables, lean meats and healthy fats Drink adequate fluids Continue offering high calorie meals and snacks Practice safety precautions fall prevention strategies: change position slowly, use assistive device such as walker or cane (per provider recommendations) when walking, keep walkways clear, have good lighting in room. It is important to contact your provider if you have any falls, maintain muscle strength/tone by exercise per provider recomme tips for everyday care of patients with dementia: keep a routine, such as bathing dressing eating at the same time each day, plan activities that the person enjoys and try to do them at the same time each day, serve meals in a consistent, familiar place and give patient enough time to eat, encourage use of loose-fitting, comfortable, easy to use clothing such as, elastic waistbands, fabric fasteners, or  large zipper pulls instead of shoelaces, buttons, or buckles. Change positions every 2 hours Keep skin clean and dry Case closure today for RN care manager, please let your doctor know if you have any future needs for care management       Plan:No further follow up required: Case closure  Jacqlyn Larsen [facility], BSN RN Case Manager [place] [phone]

## 2022-08-01 NOTE — Patient Instructions (Signed)
Visit Information  Thank you for taking time to visit with me today. Please don't hesitate to contact me if I can be of assistance to you before our next scheduled telephone appointment.  Following are the goals we discussed today:  Take medications as prescribed   Attend all scheduled provider appointments Call pharmacy for medication refills 3-7 days in advance of running out of medications Call provider office for new concerns or questions  Work with the social worker to address care coordination needs and will continue to work with the clinical team to address health care and disease management related needs check blood pressure 3 times per week keep a blood pressure log keep all doctor appointments take medications for blood pressure exactly as prescribed report new symptoms to your doctor eat more whole grains, fruits and vegetables, lean meats and healthy fats Drink adequate fluids Continue offering high calorie meals and snacks Practice safety precautions fall prevention strategies: change position slowly, use assistive device such as walker or cane (per provider recommendations) when walking, keep walkways clear, have good lighting in room. It is important to contact your provider if you have any falls, maintain muscle strength/tone by exercise per provider recomme tips for everyday care of patients with dementia: keep a routine, such as bathing dressing eating at the same time each day, plan activities that the person enjoys and try to do them at the same time each day, serve meals in a consistent, familiar place and give patient enough time to eat, encourage use of loose-fitting, comfortable, easy to use clothing such as, elastic waistbands, fabric fasteners, or large zipper pulls instead of shoelaces, buttons, or buckles. Change positions every 2 hours Keep skin clean and dry Case closure today for RN care manager, please let your doctor know if you have any future needs for care  management   Please call the care guide team at [phone] if you need to cancel or reschedule your appointment.   If you are experiencing a Mental Health or [place] or need someone to talk to, please call the Suicide and Crisis Lifeline: 988 call the [place] National Suicide Prevention Lifeline: (916)490-5920 or TTY: 424-179-3904 TTY 302-407-2260) to talk to a trained counselor call 1-800-273-TALK (toll free, 24 hour hotline) go to [facility] [address] [place] [phone]) call the [place]: [phone] call 911   Patient verbalizes understanding of instructions and care plan provided today and agrees to view in [place]. Active MyChart status and patient understanding of how to access instructions and care plan via MyChart confirmed with patient.     Jacqlyn Larsen [facility], BSN RN Case Manager [facility] [phone]

## 2022-08-15 ENCOUNTER — Other Ambulatory Visit: Payer: Self-pay | Admitting: Hematology

## 2022-08-16 ENCOUNTER — Encounter: Payer: Self-pay | Admitting: Cardiology

## 2022-08-16 ENCOUNTER — Ambulatory Visit: Payer: Medicare Other | Admitting: Cardiology

## 2022-08-16 VITALS — BP 130/80 | HR 80 | Ht 59.0 in | Wt 165.6 lb

## 2022-08-16 DIAGNOSIS — R0602 Shortness of breath: Secondary | ICD-10-CM | POA: Diagnosis not present

## 2022-08-16 NOTE — Addendum Note (Signed)
Addended by: Merlene Laughter on: 08/16/2022 09:41 AM   Modules accepted: Orders

## 2022-08-16 NOTE — Progress Notes (Signed)
Clinical Summary Whitney Velez is a 72 y.o.female seen as a new consult, referred by Dr Lacinda Axon for the following medical problems.  1.SOB  - 05/2022 echo: LVEF 70-75%, no WMAs, grade I dd, normal RV and PASP, no significant valve pathology - 05/2022 CXR: no acute process - BNP was 18 - 3-4 months worsening DOE. No coughing, no wheezing. No edema. No indications of chest pain.  - no smoking history.    2.Dementia - lives with daughter - can feed herself but neeeds assistance with other ADLs. Limited communication, good days and bad.   Past Medical History:  Diagnosis Date   Asthma    Hypertension      Allergies  Allergen Reactions   Penicillins Other (See Comments)    Nausea, vomiting, dizziness Has patient had a PCN reaction causing immediate rash, facial/tongue/throat swelling, SOB or lightheadedness with hypotension: no Has patient had a PCN reaction causing severe rash involving mucus membranes or skin necrosis: no Has patient had a PCN reaction that required hospitalization: no Has patient had a PCN reaction occurring within the last 10 years: no If all of the above answers are "NO", then may proceed with Cephalosporin use.    Percocet [Oxycodone-Acetaminophen] Diarrhea and Nausea And Vomiting   Ranitidine Itching     Current Outpatient Medications  Medication Sig Dispense Refill   albuterol (VENTOLIN HFA) 108 (90 Base) MCG/ACT inhaler INHALE TWO PUFFS INTO THE LUNGS EVERY SIX HOURS AS NEEDED FOR WHEEZING 8.5 g 2   anastrozole (ARIMIDEX) 1 MG tablet TAKE ONE (1) TABLET BY MOUTH EVERY DAY 90 tablet 3   aspirin 81 MG tablet Take 81 mg by mouth daily.     Calcium Carb-Cholecalciferol (CALCIUM + VITAMIN D3 PO) Take 1 tablet by mouth 2 (two) times daily.     cetirizine (ZYRTEC) 10 MG tablet Take 10 mg by mouth daily.     denosumab (PROLIA) 60 MG/ML SOSY injection Inject 60 mg into the skin every 6 (six) months.     Dextromethorphan-quiNIDine (NUEDEXTA) 20-10 MG capsule       donepezil (ARICEPT) 10 MG tablet Take 10 mg by mouth daily.     ibuprofen (ADVIL) 200 MG tablet Take 200 mg by mouth at bedtime as needed.     memantine (NAMENDA) 10 MG tablet Take 10 mg by mouth 2 (two) times daily.     montelukast (SINGULAIR) 10 MG tablet TAKE ONE TABLET ('10MG'$  TOTAL) BY MOUTH ATBEDTIME 90 tablet 0   OLANZapine (ZYPREXA) 7.5 MG tablet Take 7.5 mg by mouth at bedtime.     OVER THE COUNTER MEDICATION Stool softner once daily     potassium chloride (KLOR-CON) 10 MEQ tablet TAKE TWO (2) TABLETS BY MOUTH DAILY 60 tablet 3   sertraline (ZOLOFT) 100 MG tablet Take 150 mg by mouth daily.     No current facility-administered medications for this visit.     Past Surgical History:  Procedure Laterality Date   ABDOMINAL HYSTERECTOMY     MASTECTOMY MODIFIED RADICAL Right 12/01/2018   Procedure: RIGHT MODIFIED RADICAL MASTECTOMY (Procedure #1);  Surgeon: Aviva Signs, MD;  Location: [facility];  Service: General;  Laterality: Right;   PORTACATH PLACEMENT Left 06/04/2018   Procedure: INSERTION PORT-A-CATH;  Surgeon: Aviva Signs, MD;  Location: [facility];  Service: General;  Laterality: Left;   SIMPLE MASTECTOMY WITH AXILLARY SENTINEL NODE BIOPSY Left 12/01/2018   Procedure: LEFT SIMPLE MASTECTOMY (Procedure #2);  Surgeon: Aviva Signs, MD;  Location: [facility];  Service:  General;  Laterality: Left;     Allergies  Allergen Reactions   Penicillins Other (See Comments)    Nausea, vomiting, dizziness Has patient had a PCN reaction causing immediate rash, facial/tongue/throat swelling, SOB or lightheadedness with hypotension: no Has patient had a PCN reaction causing severe rash involving mucus membranes or skin necrosis: no Has patient had a PCN reaction that required hospitalization: no Has patient had a PCN reaction occurring within the last 10 years: no If all of the above answers are "NO", then may proceed with Cephalosporin use.    Percocet [Oxycodone-Acetaminophen] Diarrhea and  Nausea And Vomiting   Ranitidine Itching      No family history on file.   Social History Whitney Velez reports that she has never smoked. She has never used smokeless tobacco. Whitney Velez reports no history of alcohol use.   Review of Systems Unable to collect due to dementia.    Physical Examination Today's Vitals   08/16/22 0851 08/16/22 0930  BP: (!) 140/80 130/80  Pulse: 80   SpO2: 96%   Weight: 165 lb 9.6 oz (75.1 kg)   Height: '4\' 11"'$  (1.499 m)    Body mass index is 33.45 kg/m.  Gen: resting comfortably, no acute distress HEENT: no scleral icterus, pupils equal round and reactive, no palptable cervical adenopathy,  CV: RRR, no m/r/g no jvd Resp: Clear to auscultation bilaterally GI: abdomen is soft, non-tender, non-distended, normal bowel sounds, no hepatosplenomegaly MSK: extremities are warm, no edema.  Skin: warm, no rash Neuro:  no focal deficits Psych: appropriate affect      Assessment and Plan  1.SOB - unclear etiology - benign echo other than mild diastolic dysfunction, BNP was normal and she is euvoelmic on exam - poor candidate to consider ischemic testing given her advanced dementia. Even if was able to get through a noninvasive test if it was abnormal I don't see that she would be a candidate for invasive testing  - does not appear would be able to cooperate with PFTs given dementia - no further cardiac workup at this time    F/u as needed  Arnoldo Lenis, M.D.

## 2022-08-16 NOTE — Patient Instructions (Signed)
Medication Instructions:  Your physician recommends that you continue on your current medications as directed. Please refer to the Current Medication list given to you today.   Labwork: none  Testing/Procedures: none  Follow-Up:  Your physician recommends that you schedule a follow-up appointment in: Follow up as needed  Any Other Special Instructions Will Be Listed Below (If Applicable).  If you need a refill on your cardiac medications before your next appointment, please call your pharmacy.

## 2022-08-19 DIAGNOSIS — R296 Repeated falls: Secondary | ICD-10-CM | POA: Diagnosis not present

## 2022-08-30 DIAGNOSIS — I10 Essential (primary) hypertension: Secondary | ICD-10-CM

## 2022-08-30 DIAGNOSIS — F0390 Unspecified dementia without behavioral disturbance: Secondary | ICD-10-CM

## 2022-09-14 ENCOUNTER — Other Ambulatory Visit: Payer: Self-pay | Admitting: Family Medicine

## 2022-09-21 ENCOUNTER — Inpatient Hospital Stay: Payer: Medicare Other | Attending: Hematology

## 2022-09-21 DIAGNOSIS — Z9221 Personal history of antineoplastic chemotherapy: Secondary | ICD-10-CM | POA: Insufficient documentation

## 2022-09-21 DIAGNOSIS — Z79811 Long term (current) use of aromatase inhibitors: Secondary | ICD-10-CM | POA: Diagnosis not present

## 2022-09-21 DIAGNOSIS — F0150 Vascular dementia without behavioral disturbance: Secondary | ICD-10-CM | POA: Insufficient documentation

## 2022-09-21 DIAGNOSIS — Z79899 Other long term (current) drug therapy: Secondary | ICD-10-CM | POA: Diagnosis not present

## 2022-09-21 DIAGNOSIS — Z170 Estrogen receptor positive status [ER+]: Secondary | ICD-10-CM | POA: Diagnosis not present

## 2022-09-21 DIAGNOSIS — Z9013 Acquired absence of bilateral breasts and nipples: Secondary | ICD-10-CM | POA: Insufficient documentation

## 2022-09-21 DIAGNOSIS — C50811 Malignant neoplasm of overlapping sites of right female breast: Secondary | ICD-10-CM | POA: Insufficient documentation

## 2022-09-21 DIAGNOSIS — Z7982 Long term (current) use of aspirin: Secondary | ICD-10-CM | POA: Diagnosis not present

## 2022-09-21 DIAGNOSIS — M8580 Other specified disorders of bone density and structure, unspecified site: Secondary | ICD-10-CM | POA: Insufficient documentation

## 2022-09-21 LAB — CBC WITH DIFFERENTIAL/PLATELET
Abs Immature Granulocytes: 0.04 10*3/uL (ref 0.00–0.07)
Basophils Absolute: 0.1 10*3/uL (ref 0.0–0.1)
Basophils Relative: 1 %
Eosinophils Absolute: 1 10*3/uL — ABNORMAL HIGH (ref 0.0–0.5)
Eosinophils Relative: 11 %
HCT: 38.2 % (ref 36.0–46.0)
Hemoglobin: 12 g/dL (ref 12.0–15.0)
Immature Granulocytes: 1 %
Lymphocytes Relative: 17 %
Lymphs Abs: 1.5 10*3/uL (ref 0.7–4.0)
MCH: 27.6 pg (ref 26.0–34.0)
MCHC: 31.4 g/dL (ref 30.0–36.0)
MCV: 88 fL (ref 80.0–100.0)
Monocytes Absolute: 0.6 10*3/uL (ref 0.1–1.0)
Monocytes Relative: 6 %
Neutro Abs: 5.8 10*3/uL (ref 1.7–7.7)
Neutrophils Relative %: 64 %
Platelets: 342 10*3/uL (ref 150–400)
RBC: 4.34 MIL/uL (ref 3.87–5.11)
RDW: 16.7 % — ABNORMAL HIGH (ref 11.5–15.5)
WBC: 8.9 10*3/uL (ref 4.0–10.5)
nRBC: 0 % (ref 0.0–0.2)

## 2022-09-21 LAB — COMPREHENSIVE METABOLIC PANEL
ALT: 25 U/L (ref 0–44)
AST: 35 U/L (ref 15–41)
Albumin: 3.7 g/dL (ref 3.5–5.0)
Alkaline Phosphatase: 86 U/L (ref 38–126)
Anion gap: 12 (ref 5–15)
BUN: 20 mg/dL (ref 8–23)
CO2: 25 mmol/L (ref 22–32)
Calcium: 9.9 mg/dL (ref 8.9–10.3)
Chloride: 102 mmol/L (ref 98–111)
Creatinine, Ser: 1.24 mg/dL — ABNORMAL HIGH (ref 0.44–1.00)
GFR, Estimated: 46 mL/min — ABNORMAL LOW (ref >60)
Glucose, Bld: 124 mg/dL — ABNORMAL HIGH (ref 70–99)
Potassium: 3.6 mmol/L (ref 3.5–5.1)
Sodium: 139 mmol/L (ref 135–145)
Total Bilirubin: 0.5 mg/dL (ref 0.3–1.2)
Total Protein: 8.5 g/dL — ABNORMAL HIGH (ref 6.5–8.1)

## 2022-09-21 LAB — VITAMIN D 25 HYDROXY (VIT D DEFICIENCY, FRACTURES): Vit D, 25-Hydroxy: 27.92 ng/mL — ABNORMAL LOW (ref 30–100)

## 2022-09-24 ENCOUNTER — Inpatient Hospital Stay: Payer: Medicare Other

## 2022-09-24 ENCOUNTER — Inpatient Hospital Stay: Payer: Medicare Other | Admitting: Hematology

## 2022-09-24 VITALS — BP 161/71 | HR 74 | Temp 97.9°F | Resp 18 | Ht 59.0 in | Wt 163.3 lb

## 2022-09-24 DIAGNOSIS — Z79899 Other long term (current) drug therapy: Secondary | ICD-10-CM | POA: Diagnosis not present

## 2022-09-24 DIAGNOSIS — Z9221 Personal history of antineoplastic chemotherapy: Secondary | ICD-10-CM | POA: Diagnosis not present

## 2022-09-24 DIAGNOSIS — M8580 Other specified disorders of bone density and structure, unspecified site: Secondary | ICD-10-CM

## 2022-09-24 DIAGNOSIS — Z79811 Long term (current) use of aromatase inhibitors: Secondary | ICD-10-CM | POA: Diagnosis not present

## 2022-09-24 DIAGNOSIS — C50811 Malignant neoplasm of overlapping sites of right female breast: Secondary | ICD-10-CM | POA: Diagnosis not present

## 2022-09-24 DIAGNOSIS — Z9013 Acquired absence of bilateral breasts and nipples: Secondary | ICD-10-CM | POA: Diagnosis not present

## 2022-09-24 DIAGNOSIS — Z7982 Long term (current) use of aspirin: Secondary | ICD-10-CM | POA: Diagnosis not present

## 2022-09-24 DIAGNOSIS — Z170 Estrogen receptor positive status [ER+]: Secondary | ICD-10-CM

## 2022-09-24 MED ORDER — HEPARIN SOD (PORK) LOCK FLUSH 100 UNIT/ML IV SOLN
500.0000 [IU] | Freq: Once | INTRAVENOUS | Status: AC
  Administered 2022-09-24: 500 [IU] via INTRAVENOUS

## 2022-09-24 MED ORDER — SODIUM CHLORIDE 0.9% FLUSH
10.0000 mL | INTRAVENOUS | Status: DC | PRN
  Administered 2022-09-24: 10 mL via INTRAVENOUS

## 2022-09-24 MED ORDER — DENOSUMAB 60 MG/ML ~~LOC~~ SOSY
60.0000 mg | PREFILLED_SYRINGE | Freq: Once | SUBCUTANEOUS | Status: AC
  Administered 2022-09-24: 60 mg via SUBCUTANEOUS
  Filled 2022-09-24: qty 1

## 2022-09-24 NOTE — Patient Instructions (Signed)
[facility]  Discharge Instructions: Thank you for choosing [facility] to provide your oncology and hematology care.  If you have a lab appointment with the [facility], please come in thru the Main Entrance and check in at the main information desk.  Wear comfortable clothing and clothing appropriate for easy access to any Portacath or PICC line.   We strive to give you quality time with your provider. You may need to reschedule your appointment if you arrive late (15 or more minutes).  Arriving late affects you and other patients whose appointments are after yours.  Also, if you miss three or more appointments without notifying the office, you may be dismissed from the clinic at the provider's discretion.      For prescription refill requests, have your pharmacy contact our office and allow 72 hours for refills to be completed.    Today you received the following chemotherapy and/or immunotherapy agents Prolia and port flush   To help prevent nausea and vomiting after your treatment, we encourage you to take your nausea medication as directed.  BELOW ARE SYMPTOMS THAT SHOULD BE REPORTED IMMEDIATELY: *FEVER GREATER THAN 100.4 F (38 C) OR HIGHER *CHILLS OR SWEATING *NAUSEA AND VOMITING THAT IS NOT CONTROLLED WITH YOUR NAUSEA MEDICATION *UNUSUAL SHORTNESS OF BREATH *UNUSUAL BRUISING OR BLEEDING *URINARY PROBLEMS (pain or burning when urinating, or frequent urination) *BOWEL PROBLEMS (unusual diarrhea, constipation, pain near the anus) TENDERNESS IN MOUTH AND THROAT WITH OR WITHOUT PRESENCE OF ULCERS (sore throat, sores in mouth, or a toothache) UNUSUAL RASH, SWELLING OR PAIN  UNUSUAL VAGINAL DISCHARGE OR ITCHING   Items with * indicate a potential emergency and should be followed up as soon as possible or go to the Emergency Department if any problems should occur.  Please show the CHEMOTHERAPY ALERT CARD or IMMUNOTHERAPY ALERT CARD at check-in to  the Emergency Department and triage nurse.  Should you have questions after your visit or need to cancel or reschedule your appointment, please contact [facility] [phone]  and follow the prompts.  Office hours are 8:00 a.m. to 4:30 p.m. Monday - Friday. Please note that voicemails left after 4:00 p.m. may not be returned until the following business day.  We are closed weekends and major holidays. You have access to a nurse at all times for urgent questions. Please call the main number to the clinic [phone] and follow the prompts.  For any non-urgent questions, you may also contact your provider using MyChart. We now offer e-Visits for anyone 29 and older to request care online for non-urgent symptoms. For details visit [email or web address].   Also download the MyChart app! Go to the app store, search "MyChart", open the app, select [facility], and log in with your MyChart username and password.  Masks are optional in the cancer centers. If you would like for your care team to wear a mask while they are taking care of you, please let them know. You may have one support person who is at least 74 years old accompany you for your appointments.

## 2022-09-24 NOTE — Progress Notes (Signed)
[facility] [address] [place] [address]   Patient Care Team: Coral Spikes, DO as PCP - General (Family Medicine) Harl Bowie, Alphonse Guild, MD as PCP - Cardiology (Cardiology) Vern Claude, [place] as Social Worker  SUMMARY OF ONCOLOGIC HISTORY: Oncology History  Breast cancer, right [facility]) (Resolved)  05/12/2018 Initial Diagnosis   Breast cancer, right ([place])   06/05/2018 - 10/24/2018 Chemotherapy   The patient had DOXOrubicin (ADRIAMYCIN) chemo injection 110 mg, 60 mg/m2 = 110 mg, Intravenous,  Once, 4 of 4 cycles Administration: 110 mg (06/05/2018), 110 mg (06/19/2018), 110 mg (07/07/2018), 110 mg (07/21/2018) palonosetron (ALOXI) injection 0.25 mg, 0.25 mg, Intravenous,  Once, 4 of 4 cycles Administration: 0.25 mg (06/05/2018), 0.25 mg (06/19/2018), 0.25 mg (07/07/2018), 0.25 mg (07/21/2018) pegfilgrastim-cbqv (UDENYCA) injection 6 mg, 6 mg, Subcutaneous, Once, 4 of 4 cycles Administration: 6 mg (06/06/2018), 6 mg (06/20/2018), 6 mg (07/08/2018), 6 mg (07/22/2018) cyclophosphamide (CYTOXAN) 1,100 mg in sodium chloride 0.9 % 250 mL chemo infusion, 600 mg/m2 = 1,100 mg, Intravenous,  Once, 4 of 4 cycles Administration: 1,100 mg (06/05/2018), 1,100 mg (06/19/2018), 1,100 mg (07/07/2018), 1,100 mg (07/21/2018) PACLitaxel (TAXOL) 144 mg in sodium chloride 0.9 % 250 mL chemo infusion (</= 49m/m2), 80 mg/m2 = 144 mg, Intravenous,  Once, 12 of 12 cycles Administration: 144 mg (08/07/2018), 144 mg (08/14/2018), 144 mg (08/21/2018), 144 mg (08/28/2018), 144 mg (09/04/2018), 144 mg (09/11/2018), 144 mg (09/18/2018), 144 mg (09/25/2018), 144 mg (10/02/2018), 144 mg (10/09/2018), 144 mg (10/17/2018), 144 mg (10/24/2018) fosaprepitant (EMEND) 150 mg, dexamethasone (DECADRON) 12 mg in sodium chloride 0.9 % 145 mL IVPB, , Intravenous,  Once, 4 of 4 cycles Administration:  (06/05/2018),  (06/19/2018),  (07/07/2018),  (07/21/2018)  for chemotherapy treatment.      CHIEF COMPLIANT: Follow-up for right breast  cancer   INTERVAL HISTORY: Ms. MRODNESHA ELIEis a 72y.o. female seen for follow-up of breast cancer.  She is accompanied by her Whitney Velez.  Whitney Velez reports that she needs help in most of her activities.  She is living with her Whitney Velez now.  REVIEW OF SYSTEMS:   Review of Systems  Constitutional:  Negative for appetite change and fatigue.  HENT:   Negative for trouble swallowing.   Neurological:  Positive for numbness.  Psychiatric/Behavioral:  The patient is nervous/anxious.   All other systems reviewed and are negative.   I have reviewed the past medical history, past surgical history, social history and family history with the patient and they are unchanged from previous note.   ALLERGIES:   is allergic to penicillins, percocet [oxycodone-acetaminophen], and ranitidine.   MEDICATIONS:  Current Outpatient Medications  Medication Sig Dispense Refill   albuterol (VENTOLIN HFA) 108 (90 Base) MCG/ACT inhaler INHALE TWO PUFFS INTO THE LUNGS EVERY SIX HOURS AS NEEDED FOR WHEEZING 8.5 g 2   anastrozole (ARIMIDEX) 1 MG tablet TAKE ONE (1) TABLET BY MOUTH EVERY DAY 90 tablet 3   aspirin 81 MG tablet Take 81 mg by mouth daily.     Calcium Carb-Cholecalciferol (CALCIUM + VITAMIN D3 PO) Take 1 tablet by mouth 2 (two) times daily.     cetirizine (ZYRTEC) 10 MG tablet Take 10 mg by mouth daily.     denosumab (PROLIA) 60 MG/ML SOSY injection Inject 60 mg into the skin every 6 (six) months.     donepezil (ARICEPT) 10 MG tablet Take 10 mg by mouth daily.     ibuprofen (ADVIL) 200 MG tablet Take 200 mg by mouth  at bedtime as needed.     memantine (NAMENDA) 10 MG tablet Take 10 mg by mouth 2 (two) times daily.     montelukast (SINGULAIR) 10 MG tablet TAKE ONE TABLET (10MG TOTAL) BY MOUTH ATBEDTIME 90 tablet 0   OLANZapine (ZYPREXA) 7.5 MG tablet Take 7.5 mg by mouth at bedtime.     OVER THE COUNTER MEDICATION Stool softner once daily     potassium chloride (KLOR-CON) 10 MEQ tablet TAKE TWO (2)  TABLETS BY MOUTH DAILY 60 tablet 3   sertraline (ZOLOFT) 100 MG tablet Take 150 mg by mouth daily.     No current facility-administered medications for this visit.   Facility-Administered Medications Ordered in Other Visits  Medication Dose Route Frequency Provider Last Rate Last Admin   sodium chloride flush (NS) 0.9 % injection 10 mL  10 mL Intravenous PRN Derek Jack, MD   10 mL at 09/24/22 1529     PHYSICAL EXAMINATION: Performance status (ECOG): 1 - Symptomatic but completely ambulatory  Vitals:   09/24/22 1429  BP: (!) 161/71  Pulse: 74  Resp: 18  Temp: 97.9 F (36.6 C)  SpO2: 98%   Wt Readings from Last 3 Encounters:  09/24/22 163 lb 4.8 oz (74.1 kg)  08/16/22 165 lb 9.6 oz (75.1 kg)  06/06/22 167 lb (75.8 kg)   Physical Exam Vitals reviewed.  Constitutional:      Appearance: Normal appearance.  Cardiovascular:     Rate and Rhythm: Normal rate and regular rhythm.     Pulses: Normal pulses.     Heart sounds: Normal heart sounds.  Pulmonary:     Effort: Pulmonary effort is normal.     Breath sounds: Normal breath sounds.  Chest:  Breasts:    Right: Absent. No mass, skin change (mastectomy site WNL) or tenderness.     Left: Absent. No mass, skin change (mastectomy site WNL) or tenderness.  Abdominal:     Palpations: Abdomen is soft. There is no hepatomegaly, splenomegaly or mass.  Neurological:     General: No focal deficit present.     Mental Status: She is alert and oriented to person, place, and time.  Psychiatric:        Mood and Affect: Mood normal.        Behavior: Behavior normal.     Breast Exam Chaperone: Thana Ates     LABORATORY DATA:  I have reviewed the data as listed    Latest Ref Rng & Units 09/21/2022    9:22 AM 06/06/2022    3:38 PM 03/14/2022    1:30 PM  CMP  Glucose 70 - 99 mg/dL 124  81  75   BUN 8 - 23 mg/dL '20  19  24   ' Creatinine 0.44 - 1.00 mg/dL 1.24  1.07  1.12   Sodium 135 - 145 mmol/L 139  137  135    Potassium 3.5 - 5.1 mmol/L 3.6  3.9  3.9   Chloride 98 - 111 mmol/L 102  97  99   CO2 22 - 32 mmol/L '25  23  27   ' Calcium 8.9 - 10.3 mg/dL 9.9  9.9  10.0   Total Protein 6.5 - 8.1 g/dL 8.5  8.2  8.4   Total Bilirubin 0.3 - 1.2 mg/dL 0.5  0.2  0.4   Alkaline Phos 38 - 126 U/L 86  92  84   AST 15 - 41 U/L 35  36  33   ALT 0 - 44 U/L 25  27  24    Lab Results  Component Value Date   CAN153 11.2 03/14/2022   CAN153 10.2 09/13/2021   Lab Results  Component Value Date   WBC 8.9 09/21/2022   HGB 12.0 09/21/2022   HCT 38.2 09/21/2022   MCV 88.0 09/21/2022   PLT 342 09/21/2022   NEUTROABS 5.8 09/21/2022    ASSESSMENT:  1.  Locally advanced (stage IIIa, T3N1) right breast cancer: - Mammogram on 03/28/2018 was BI-RADS Category 0.  Mammogram and ultrasound on 04/22/2018 shows 5.9 x 5 x 6 cm mass extending in the upper quadrant of the right breast and right axillary lymph node measuring 2.8 x 1.6 x 1.7 cm.  Left breast has retroareolar mass.  Patient did not have mammogram in the last 20 years. - Biopsy on 04/29/2018 of the right breast mass consistent with IDC, grade 2-3, lymph node biopsy positive for metastatic disease, ER/PR positive, HER-2 negative, Ki-67 of 5% -Seen by Dr. Arnoldo Morale who recommended right modified radical mastectomy and left simple mastectomy. - Bone scan showed  increased uptake in the right greater trochanter and right sacroiliac joint.  CT scan of the chest abdomen and pelvis did not show any evidence of metastatic disease.  The uptake in the right greater trochanter was thought to be due to enthesopathic change.  Uptake in the right sacroiliac joint was likely from degenerative changes at the right L5-S1 joint. - Due to advanced nature of her breast cancer, we have recommended neoadjuvant chemotherapy with dose dense AC followed by weekly paclitaxel for 12 weeks. - 4 cycles of dose dense AC from 06/05/2018 through 07/21/2018. - 12 cycles of weekly paclitaxel from 08/07/2018  through 10/23/2018.  Weekly paclitaxel started on 08/07/2018.  - Last physical examination revealed 4 to 5 cm mass in the right breast, softer, freely mobile.  Very small lymph node palpable in the right axilla.  Left breast has nonspecific mass in the center. -Anastrozole started on 11/07/2018. - Right modified radical mastectomy on 12/01/2018 with left simple mastectomy. 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 -We have called pathology and requested ER/PR and HER-2/neu staining on the mastectomy.  This will be reported in 2 to 3 days. -She is complaining of weakness in her legs.  We will order physical therapy. - I have recommended doing a whole-body PET CT scan.  We will see her back in 3 to 4 weeks.   2.  Left breast DCIS: -Biopsy on 04/29/2018 of the left breast shows DCIS, ER/PR positive.  Left axillary lymph node biopsy was negative for malignancy. -Left simple mastectomy on 12/01/2018 shows 7 cm DCIS, intermediate grade, with cribriform, papillary and cystic morphology.  No evidence of invasion.   3.  Osteopenia: - DEXA scan on 11/13/2018 shows T score of -0.8.  She is taking calcium and vitamin D.   PLAN:  1.  Locally advanced (stage IIIa, T3N1) right breast cancer: - She is tolerating anastrozole very well. - Bilateral mastectomy sites are within normal limits.  No palpable adenopathy. - Labs from 09/19/2022 showed creatinine 1.24.  LFTs are normal.  CBC was normal.  Vitamin D is 27. - We discussed to continue anastrozole at this time.  If her dementia gets worse, will consider discontinuing anastrozole.  She is currently living with her Whitney Velez and needs  help with walking and some ADLs.   2.  Osteopenia: - Calcium today is 9.9.  She will proceed with Prolia.  If her dementia  gets worse, will consider discontinuing Prolia.   3.  Vascular dementia: - Continue follow-up with Dr. Merlene Laughter.  Breast Cancer therapy associated bone loss: I have recommended calcium, Vitamin D and weight bearing exercises.  Orders placed this encounter:  Orders Placed This Encounter  Procedures   CBC with Differential/Platelet   Comprehensive metabolic panel   VITAMIN D 25 Hydroxy (Vit-D Deficiency, Fractures)     The patient has a good understanding of the overall plan. She agrees with it. She will call with any problems that may develop before the next visit here.  Derek Jack, MD [place] [phone]

## 2022-09-24 NOTE — Progress Notes (Signed)
MACLAINE AHOLA presents today for injection per the provider's orders.  Prolia administration without incident; injection site WNL; see MAR for injection details.  Patient tolerated procedure well and without incident.  No questions or complaints noted at this time.   Pt's Calcium is 9.9, pt reports taking Calcium and Vit D supplements. No recent or future dental appointments at this time. Pt denies tooth or jaw pain at this time.  Lorella Nimrod presented for Portacath access and flush.  Portacath located right chest wall accessed with  H 20 needle.  Good blood return present. Portacath flushed with 64m NS and 500U/58mHeparin and needle removed intact. No brising or swelling noted at the site.  Procedure tolerated well and without incident.  Discharged from clinic ambulatory in stable condition. Alert and oriented x 3. [facility]

## 2022-09-24 NOTE — Patient Instructions (Signed)
[place]  Discharge Instructions  You were seen and examined today by Dr. Delton Coombes.  Dr. Delton Coombes discussed your most recent lab work and labs all look good and stable. Continue Prolia injection every 6 months. Get port flush every 3 months.  Follow-up as scheduled in 6 months.    Thank you for choosing [facility] to provide your oncology and hematology care.   To afford each patient quality time with our provider, please arrive at least 15 minutes before your scheduled appointment time. You may need to reschedule your appointment if you arrive late (10 or more minutes). Arriving late affects you and other patients whose appointments are after yours.  Also, if you miss three or more appointments without notifying the office, you may be dismissed from the clinic at the provider's discretion.    Again, thank you for choosing [facility].  Our hope is that these requests will decrease the amount of time that you wait before being seen by our physicians.   If you have a lab appointment with the [facility] please come in thru the Main Entrance and check in at the main information desk.           _____________________________________________________________  Should you have questions after your visit to [facility], please contact our office at [phone] and follow the prompts.  Our office hours are 8:00 a.m. to 4:30 p.m. Monday - Thursday and 8:00 a.m. to 2:30 p.m. Friday.  Please note that voicemails left after 4:00 p.m. may not be returned until the following business day.  We are closed weekends and all major holidays.  You do have access to a nurse 24-7, just call the main number to the clinic [phone] and do not press any options, hold on the line and a nurse will answer the phone.    For prescription refill requests, have your pharmacy contact our office and allow 72 hours.    Masks are  optional in the cancer centers. If you would like for your care team to wear a mask while they are taking care of you, please let them know. You may have one support person who is at least 74 years old accompany you for your appointments.

## 2022-10-10 ENCOUNTER — Ambulatory Visit: Payer: Self-pay | Admitting: Family Medicine

## 2022-10-10 ENCOUNTER — Encounter: Payer: Self-pay | Admitting: Physician Assistant

## 2022-10-10 ENCOUNTER — Ambulatory Visit (INDEPENDENT_AMBULATORY_CARE_PROVIDER_SITE_OTHER): Payer: Medicare Other | Admitting: Family Medicine

## 2022-10-10 VITALS — BP 155/81 | HR 66 | Temp 97.2°F | Ht 59.0 in | Wt 164.0 lb

## 2022-10-10 DIAGNOSIS — I10 Essential (primary) hypertension: Secondary | ICD-10-CM

## 2022-10-10 DIAGNOSIS — G309 Alzheimer's disease, unspecified: Secondary | ICD-10-CM | POA: Diagnosis not present

## 2022-10-10 DIAGNOSIS — F02C3 Dementia in other diseases classified elsewhere, severe, with mood disturbance: Secondary | ICD-10-CM | POA: Diagnosis not present

## 2022-10-10 DIAGNOSIS — R2689 Other abnormalities of gait and mobility: Secondary | ICD-10-CM | POA: Diagnosis not present

## 2022-10-10 DIAGNOSIS — I951 Orthostatic hypotension: Secondary | ICD-10-CM | POA: Diagnosis not present

## 2022-10-10 DIAGNOSIS — F482 Pseudobulbar affect: Secondary | ICD-10-CM | POA: Diagnosis not present

## 2022-10-10 DIAGNOSIS — Z23 Encounter for immunization: Secondary | ICD-10-CM

## 2022-10-10 NOTE — Patient Instructions (Signed)
You're doing your best. She is doing quite well given the circumstances.  Follow up in 6 months.  I will place the referral.  Take care  Dr. Lacinda Axon

## 2022-10-11 NOTE — Progress Notes (Signed)
Subjective:  Patient ID: Whitney Velez, female    DOB: 1950-05-09  Age: 72 y.o. MRN: [id number]  CC: Chief Complaint  Patient presents with   Dementia    Daughter noticed decline , crying spells more often, less ambulatory knee pain and dizzyness upon standing Needs referral to neurology - last one is closing    HPI:  72 year old female with dementia, CKD, hypertension, history of breast cancer presents for follow-up.  Patient is accompanied by her daughter today.  Her daughter is the primary caregiver.  Daughter has noticed decline.  She has been more agitated and more forgetful.  Has been quite anxious as well.  Needs constant reminders and redirection.  She is sleeping well.  She is eating well.  She is on Zyprexa in the evenings.  She has been managed by neurology but the neurology office that she goes to is closing.  Needs new referral.  Daughter is concerned about the decline is unsure what to do.  Patient Active Problem List   Diagnosis Date Noted   CKD (chronic kidney disease) stage 3, GFR 30-59 ml/min (HCC) 01/03/2022   Urinary incontinence 07/11/2021   Falls frequently 07/11/2021   Dementia ([place]) 04/24/2021   Osteopenia 05/08/2019   S/P bilateral mastectomy 12/01/2018   Invasive ductal carcinoma of breast, female, right ([place])    Ductal carcinoma in situ (DCIS) of left breast    Goals of care, counseling/discussion 05/29/2018   Allergic rhinitis 02/20/2015   Essential hypertension, benign 12/16/2013    Social Hx   Social History   Socioeconomic History   Marital status: Widowed    Spouse name: Not on file   Number of children: Not on file   Years of education: Not on file   Highest education level: Not on file  Occupational History   Not on file  Tobacco Use   Smoking status: Never   Smokeless tobacco: Never  Vaping Use   Vaping Use: Never used  Substance and Sexual Activity   Alcohol use: Never   Drug use: Never   Sexual activity: Not on file  Other  Topics Concern   Not on file  Social History Narrative   Not on file   Social Determinants of Health   Financial Resource Strain: Low Risk  (05/01/2022)   Overall Financial Resource Strain (CARDIA)    Difficulty of Paying Living Expenses: Not hard at all  Food Insecurity: No Food Insecurity (05/01/2022)   Hunger Vital Sign    Worried About Running Out of Food in the Last Year: Never true    [place] in the Last Year: Never true  Transportation Needs: No Transportation Needs (05/01/2022)   PRAPARE - Hydrologist (Medical): No    Lack of Transportation (Non-Medical): No  Physical Activity: Insufficiently Active (05/01/2022)   Exercise Vital Sign    Days of Exercise per Week: 3 days    Minutes of Exercise per Session: 30 min  Stress: No Stress Concern Present (05/01/2022)   [place]    Feeling of Stress : Not at all  Social Connections: Moderately Integrated (05/01/2022)   Social Connection and Isolation Panel [NHANES]    Frequency of Communication with Friends and Family: Never    Frequency of Social Gatherings with Friends and Family: More than three times a week    Attends Religious Services: More than 4 times per year    Active Member of  Clubs or Organizations: Yes    Attends Archivist Meetings: More than 4 times per year    Marital Status: Widowed    Review of Systems Per HPI  Objective:  BP (!) 155/81   Pulse 66   Temp (!) 97.2 F (36.2 C)   Ht '4\' 11"'$  (1.499 m)   Wt 164 lb (74.4 kg)   SpO2 100%   BMI 33.12 kg/m      10/10/2022   11:32 AM 10/10/2022   10:49 AM 09/24/2022    2:29 PM  BP/Weight  Systolic BP 979 892 119  Diastolic BP 81 85 71  Wt. (Lbs)  164 163.3  BMI  33.12 kg/m2 32.98 kg/m2    Physical Exam Vitals and nursing note reviewed.  Constitutional:      General: She is not in acute distress. HENT:     Head: Normocephalic and atraumatic.   Eyes:     General:        Right eye: No discharge.        Left eye: No discharge.     Conjunctiva/sclera: Conjunctivae normal.  Cardiovascular:     Rate and Rhythm: Normal rate and regular rhythm.  Pulmonary:     Effort: Pulmonary effort is normal.     Breath sounds: Normal breath sounds. No wheezing or rales.  Neurological:     Mental Status: She is alert.  Psychiatric:     Comments: Depressed mood.     Lab Results  Component Value Date   WBC 8.9 09/21/2022   HGB 12.0 09/21/2022   HCT 38.2 09/21/2022   PLT 342 09/21/2022   GLUCOSE 124 (H) 09/21/2022   CHOL 211 (H) 03/28/2020   TRIG 81 03/28/2020   HDL 65 03/28/2020   LDLCALC 132 (H) 03/28/2020   ALT 25 09/21/2022   AST 35 09/21/2022   NA 139 09/21/2022   K 3.6 09/21/2022   CL 102 09/21/2022   CREATININE 1.24 (H) 09/21/2022   BUN 20 09/21/2022   CO2 25 09/21/2022   TSH 1.120 01/26/2021   INR 1.1 11/03/2007   HGBA1C 5.5 03/28/2020   MICROALBUR 2.30 (H) 07/20/2014     Assessment & Plan:   Problem List Items Addressed This Visit       Cardiovascular and Mediastinum   Essential hypertension, benign (Chronic)    BP mildly elevated today.  We will continue to monitor closely.  Given age and severe dementia and history of frequent falls, I am not being very aggressive with her blood pressure control to prevent hypotension.        Nervous and Auditory   Dementia ([place]) - Primary    Advanced dementia.  There is not much else that can be done from a medical standpoint.  Daughter does exceptionally well taking care of her and keeping her safe.  Advised the daughter that she is doing a great job.  Continue current medications.  Supportive care.      Relevant Orders   Ambulatory referral to Neurology   Other Visit Diagnoses     Immunization due       Relevant Orders   Flu Vaccine QUAD High Dose(Fluad)      Follow-up: 6 months  [place]

## 2022-10-11 NOTE — Assessment & Plan Note (Signed)
BP mildly elevated today.  We will continue to monitor closely.  Given age and severe dementia and history of frequent falls, I am not being very aggressive with her blood pressure control to prevent hypotension.

## 2022-10-11 NOTE — Assessment & Plan Note (Signed)
Advanced dementia.  There is not much else that can be done from a medical standpoint.  Daughter does exceptionally well taking care of her and keeping her safe.  Advised the daughter that she is doing a great job.  Continue current medications.  Supportive care.

## 2022-10-17 ENCOUNTER — Ambulatory Visit: Payer: Medicare Other | Admitting: Physician Assistant

## 2022-10-17 ENCOUNTER — Encounter: Payer: Self-pay | Admitting: Physician Assistant

## 2022-10-17 VITALS — BP 156/97 | HR 108 | Resp 18 | Ht 59.0 in | Wt 166.0 lb

## 2022-10-17 DIAGNOSIS — F01518 Vascular dementia, unspecified severity, with other behavioral disturbance: Secondary | ICD-10-CM | POA: Diagnosis not present

## 2022-10-17 DIAGNOSIS — G309 Alzheimer's disease, unspecified: Secondary | ICD-10-CM

## 2022-10-17 DIAGNOSIS — F02818 Dementia in other diseases classified elsewhere, unspecified severity, with other behavioral disturbance: Secondary | ICD-10-CM | POA: Diagnosis not present

## 2022-10-17 NOTE — Progress Notes (Cosign Needed Addendum)
Assessment/Plan:    The patient is seen in neurologic consultation at the request of Phillips Odor, MD for the evaluation of memory.  Whitney Velez is a very pleasant 72 y.o. year old RH female with  a history of hypertension, hyperlipidemia, CKD 3, low ferritin, breast cancer on anastrozole, depression, and a history of dementia due to Alzheimer's disease diagnosed in Jan 2022. Her neurologist is closing his practice and she presents for continuity of care.  Her last MRI brain in 2021 had shown advanced white matter disease and chronic microvascular changes. A new MRI brain has been ordered per PCP, awaiting results. MMSE today is 5/30. She is on Memantine '10mg'$  BID and Donepezil '10mg'$  daily with continued decline.    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 At some point patient to need HHN versus Memory Care , daughter agrees  Folllow up  in 6 months   Subjective:    The patient is accompanied by her daughter  who supplements the history.    How long did patient have memory difficulties?  Has been treated by "a neurology office that is closing" with donepezil and memantine since early 2022, but her daughter has noticed significant cognitive decline. She needs constant reminders and redirection.  She began noticing memory difficulties around 2019, eventually seeing the neurologist in early 2022 when she was placed on initially donepezil, and then with worsening of her  symptoms, adding memantine in July 2022.  However, her cognitive status continued to decline, continuously needing redirection, forgetting rather quickly words, names of people, unable to recognize her own family members' names.. repeats oneself?  Since 2019 when she was diagnosed with breast cancer  Disoriented when walking into a room?  Since January 2022, when she was in her own apartment and not knowing where she was, she felt very disoriented.   Leaving objects in unusual places?  Currently 2022, her daughter noticed that there were no sheets in the bed, it was 1 shoe each, and the room in the closet waiting disarray, she also developed compulsive behavior, piling rolls of paper towels and toilet paper, continuing to bite and forgetting that she did before, she also was found to have expired food in the refrigerator, ending her car, there were found 25 cases of sodas because she forgot she had  purchased them before.   Moreover, she went to the bank, and kept withdrawing money forgetting that she was withdrawing a priorly.   Patient lives  with her daughter  Ambulates  with difficulty?   Soon after moving w daughter, due to [facility], adjusted without further falls.  She uses a walker for stability, and at the adult day program she uses a wheelchair. Any head injuries?  Patient denies   History of seizures?   Patient denies   Wandering behavior?  Feb 2022 after moving with daughter she went to the back door, walked out.  When she tries 5 or 6 times after that, but the family placed a lock  and she was unable to wander any further than the door, which now remains locked.   Patient drives?   Patient no longer drives since Jan 5329 after getting lost , 3 weeks before moving with daughter Any mood changes ? Not recently  Any depression?:  She has a history of depression with crying spells, currently on Zyprexa and sertraline as per PCP, well controlled Hallucinations? Endorsed, sees people that are not there,  she also may have some conversations thinking that she is in the hospital, and asking "what happened to the other patient?  ".  She also talks to her friends that they are not there, "and to tor about relatives that she believes that they are still alive". Patient reports that sleeps "not so great, about 5 or 6 hours, after that she will wander around the room, stating that she has to go to work.  She has some vivid dreams, but they are not every time.  She may have some REM behavior, but no sleepwalking.   History of sleep apnea?  Patient denies   Any hygiene concerns?  Endorsed, "cannot get her to take a shower at all, sit on the commode, demonstrate, but will not get in ".  Sometimes she may say "I already took one when she did not " Independent of bathing and dressing?  "Needs assistance for everything, needs help with wiping " Does the patient needs help with medications?  Daughter in charge since Jan 2022 after her pills were all over the house Who is in charge of the finances? Daughter  is in charge   Any changes in appetite?  "I already ate" sometimes  Patient have trouble swallowing? Patient denies   Does the patient cook?  Patient denies , "but no hamburger because may be harder to swallow , forgetting to chew "  Any headaches?  Patient denies   Double vision? Patient denies   Any focal numbness or tingling?  Patient denies   Chronic back pain Patient denies   Unilateral weakness?  Patient denies   Any tremors?  Patient denies , but harder to grab weighted a spoon  Any history of anosmia?  Patient denies   Any incontinence of urine?  Wears a pull up, needs to change to diapers  Any bowel dysfunction?   Endorsed, incontinence of bowels  over the last 2-3 month, she is aware and this embarrasses her  History of heavy alcohol intake?  Patient denies   History of heavy tobacco use?  Patient denies   Family history of dementia?   Possible family history of  Alzheimer's disease     Pertinent labs6- 08/2022 Vit D 27, B12 544, CBC unremarkable, Iron sat low at 13 (followed at [facility])   Allergies  Allergen Reactions   Penicillins Other (See Comments)    Nausea, vomiting, dizziness Has patient had a PCN reaction causing immediate rash, facial/tongue/throat swelling, SOB or lightheadedness with hypotension: no Has patient had a PCN reaction causing severe rash involving mucus membranes or skin necrosis: no Has patient had a PCN reaction that required hospitalization: no Has patient had a PCN reaction occurring within the last 10 years: no If all of the above answers are "NO", then may proceed with Cephalosporin use.    Percocet [Oxycodone-Acetaminophen] Diarrhea and Nausea And Vomiting   Ranitidine Itching    Current Outpatient Medications  Medication Instructions   albuterol (VENTOLIN HFA) 108 (90 Base) MCG/ACT inhaler INHALE TWO PUFFS INTO THE LUNGS EVERY SIX HOURS  AS NEEDED FOR WHEEZING   anastrozole (ARIMIDEX) 1 MG tablet TAKE ONE (1) TABLET BY MOUTH EVERY DAY   aspirin 81 mg, Oral, Daily   Calcium Carb-Cholecalciferol (CALCIUM + VITAMIN D3 PO) 1 tablet, Oral, 2 times daily   cetirizine (ZYRTEC) 10 mg, Oral, Daily   denosumab (PROLIA) 60 mg, Subcutaneous, Every 6 months   donepezil (ARICEPT) 10 mg, Oral, Daily   ibuprofen (ADVIL) 200 mg, Oral, At bedtime PRN   memantine (NAMENDA) 10 mg, Oral, 2 times daily   montelukast (SINGULAIR) 10 MG tablet TAKE ONE TABLET ('10MG'$  TOTAL) BY MOUTH ATBEDTIME   OLANZapine (ZYPREXA) 7.5 mg, Oral, Daily at bedtime   OVER THE COUNTER MEDICATION Stool softner once daily    potassium chloride (KLOR-CON) 10 MEQ tablet TAKE TWO (2) TABLETS BY MOUTH DAILY   sertraline (ZOLOFT) 150 mg, Oral, Daily     VITALS:   Vitals:   10/17/22 0953  BP: (!) 156/97  Pulse: (!) 108  Resp: 18  SpO2: 95%  Weight: 166 lb (75.3 kg)  Height: '4\' 11"'$  (1.499 m)      05/01/2022    3:31 PM 09/11/2021   10:47 AM 08/09/2021   12:08 PM 08/09/2021    12:04 PM 01/26/2021    2:46 PM  Depression screen PHQ 2/9  Decreased Interest 0 0 0 0 0  Down, Depressed, Hopeless 0 0 1 1 0  PHQ - 2 Score 0 0 1 1 0    PHYSICAL EXAM   HEENT:  Normocephalic, atraumatic. The mucous membranes are moist. The superficial temporal arteries are without ropiness or tenderness. Cardiovascular: Regular rate and rhythm. Lungs: Clear to auscultation bilaterally. Neck: There are no carotid bruits noted bilaterally.  NEUROLOGICAL:     No data to display             10/17/2022    6:00 PM  MMSE - Mini Mental State Exam  Orientation to time 0  Orientation to Place 1  Registration 0  Attention/ Calculation 0  Recall 0  Language- name 2 objects 2  Language- repeat 0  Language- follow 3 step command 0  Language- read & follow direction 1  Write a sentence 0  Copy design 1  Total score 5     Orientation:  Alert and oriented to person, not to place or time. No aphasia or dysarthria. Fund of knowledge is very reduced. Recent and remote memory very impaired. Attention and concentration are reduced  Able to name objects and  unable to repeat phrases. Delayed recall  0/3 . no tongue deviation. Hearing is intact to conversational tone. Tone: Tone is good throughout. Sensation: Sensation is intact to light touch and pinprick throughout. Vibration is intact at the bilateral big toe.There is no extinction with double simultaneous stimulation. There is no sensory dermatomal level identified. Coordination: The patient has mild difficulty with RAM's and  FNF bilaterally. Normal finger to nose  Motor: Strength is 5/5 in the bilateral upper and lower extremities. There is no pronator drift. There are no fasciculations noted. DTR's: Deep tendon reflexes are 2/4 at the bilateral biceps, triceps, brachioradialis, patella and achilles.  Plantar responses are downgoing bilaterally. Gait and Station: The patient is in a wheelchair, gait not tested.     Thank you for  allowing [place] the opportunity to participate in the care of this nice patient. Please do not hesitate to contact us for any questions or concerns.   Total time spent on today's visit was 56 minutes dedicated to  this patient today, preparing to see patient, examining the patient, ordering tests and/or medications and counseling the patient, documenting clinical information in the EHR or other health record, independently interpreting results and communicating results to the patient/family, discussing treatment and goals, answering patient's questions and coordinating care.  Cc:  Ouida Sills [facility] 10/17/2022 6:35 PM

## 2022-10-17 NOTE — Patient Instructions (Signed)
It was a pleasure to see you today at our office.   Recommendations:  Follow up in 6  months Discontinue donepezil and memantine  Continue to control mood as per PCP   Whom to call:  Memory  decline, memory medications: Call our office [phone]   For psychiatric meds, mood meds: Please have your primary care physician manage these medications.      For assessment of decision of mental capacity and competency:  Call Dr. Anthoney Harada, geriatric psychiatrist at [phone]  For guidance in geriatric dementia issues please call [facility] [phone]    If you have any severe symptoms of a stroke, or other severe issues such as confusion,severe chills or fever, etc call 911 or go to the ER as you may need to be evaluated further    Feel free to visit Facebook page " [email or web address]" for tips of how to care for people with memory problems.     RECOMMENDATIONS FOR ALL PATIENTS WITH MEMORY PROBLEMS: 1. Continue to exercise (Recommend 30 minutes of walking everyday, or 3 hours every week) 2. Increase social interactions - continue going to [place] and enjoy social gatherings with friends and family 3. Eat healthy, avoid fried foods and eat more fruits and vegetables 4. Maintain adequate blood pressure, blood sugar, and blood cholesterol level. Reducing the risk of stroke and cardiovascular disease also helps promoting better memory. 5. Avoid stressful situations. Live a simple life and avoid aggravations. Organize your time and prepare for the next day in anticipation. 6. Sleep well, avoid any interruptions of sleep and avoid any distractions in the bedroom that may interfere with adequate sleep quality 7. Avoid sugar, avoid sweets as there is a strong link between excessive sugar intake, diabetes, and cognitive impairment We discussed the Mediterranean diet, which has been shown to help patients reduce the risk of progressive memory disorders and reduces cardiovascular risk.  This includes eating fish, eat fruits and green leafy vegetables, nuts like almonds and hazelnuts, walnuts, and also use olive oil. Avoid fast foods and fried foods as much as possible. Avoid sweets and sugar as sugar use has been linked to worsening of memory function.  There is always a concern of gradual progression of memory problems. If this is the case, then we may need to adjust level of care according to patient needs. Support, both to the patient and caregiver, should then be put into place.    The Alzheimer's Association is here all day, every day for people facing Alzheimer's disease through our free 24/7 Helpline: 743-697-6762. The Helpline provides reliable information and support to all those who need assistance, such as individuals living with memory loss, Alzheimer's or other dementia, caregivers, health care professionals and the public.  Our highly trained and knowledgeable staff can help you with: Understanding memory loss, dementia and Alzheimer's  Medications and other treatment options  General information about aging and brain health  Skills to provide quality care and to find the best care from professionals  Legal, financial and living-arrangement decisions Our Helpline also features: Confidential care consultation provided by master's level clinicians who can help with decision-making support, crisis assistance and education on issues families face every day  Help in a caller's preferred language using our translation service that features more than 200 languages and dialects  Referrals to local community programs, services and ongoing support     FALL PRECAUTIONS: Be cautious when walking. Scan the area for obstacles that may increase the risk of trips  and falls. When getting up in the mornings, sit up at the edge of the bed for a few minutes before getting out of bed. Consider elevating the bed at the head end to avoid drop of blood pressure when getting up. Walk  always in a well-lit room (use night lights in the walls). Avoid area rugs or power cords from appliances in the middle of the walkways. Use a walker or a cane if necessary and consider physical therapy for balance exercise. Get your eyesight checked regularly.  FINANCIAL OVERSIGHT: Supervision, especially oversight when making financial decisions or transactions is also recommended.  HOME SAFETY: Consider the safety of the kitchen when operating appliances like stoves, microwave oven, and blender. Consider having supervision and share cooking responsibilities until no longer able to participate in those. Accidents with firearms and other hazards in the house should be identified and addressed as well.   ABILITY TO BE LEFT ALONE: If patient is unable to contact 911 operator, consider using LifeLine, or when the need is there, arrange for someone to stay with patients. Smoking is a fire hazard, consider supervision or cessation. Risk of wandering should be assessed by caregiver and if detected at any point, supervision and safe proof recommendations should be instituted.  MEDICATION SUPERVISION: Inability to self-administer medication needs to be constantly addressed. Implement a mechanism to ensure safe administration of the medications.   DRIVING: Regarding driving, in patients with progressive memory problems, driving will be impaired. We advise to have someone else do the driving if trouble finding directions or if minor accidents are reported. Independent driving assessment is available to determine safety of driving.   If you are interested in the driving assessment, you can contact the following:  [employer] in [place]  [place] [phone] or [phone]      [place] refers to food and lifestyle choices that are based on the  traditions of countries located on the [employer]. This way of eating has been shown to help prevent certain conditions and improve outcomes for people who have chronic diseases, like kidney disease and heart disease. What are tips for following this plan? Lifestyle  Cook and eat meals together with your family, when possible. Drink enough fluid to keep your urine clear or pale yellow. Be physically active every day. This includes: Aerobic exercise like running or swimming. Leisure activities like gardening, walking, or housework. Get 7-8 hours of sleep each night. If recommended by your health care provider, drink red wine in moderation. This means 1 glass a day for nonpregnant women and 2 glasses a day for men. A glass of wine equals 5 oz (150 mL). Reading food labels  Check the serving size of packaged foods. For foods such as rice and pasta, the serving size refers to the amount of cooked product, not dry. Check the total fat in packaged foods. Avoid foods that have saturated fat or trans fats. Check the ingredients list for added sugars, such as corn syrup. Shopping  At the grocery store, buy most of your food from the areas near the walls of the store. This includes: Fresh fruits and vegetables (produce). Grains, beans, nuts, and seeds. Some of these may be available in unpackaged forms or large amounts (in bulk). Fresh seafood. Poultry and eggs. Low-fat dairy products. Buy whole ingredients instead of prepackaged foods. Buy fresh fruits and vegetables in-season from local farmers markets. Buy  frozen fruits and vegetables in resealable bags. If you do not have access to quality fresh seafood, buy precooked frozen shrimp or canned fish, such as tuna, salmon, or sardines. Buy small amounts of raw or cooked vegetables, salads, or olives from the deli or salad bar at your store. Stock your pantry so you always have certain foods on hand, such as olive oil, canned tuna, canned  tomatoes, rice, pasta, and beans. Cooking  Cook foods with extra-virgin olive oil instead of using butter or other vegetable oils. Have meat as a side dish, and have vegetables or grains as your main dish. This means having meat in small portions or adding small amounts of meat to foods like pasta or stew. Use beans or vegetables instead of meat in common dishes like chili or lasagna. Experiment with different cooking methods. Try roasting or broiling vegetables instead of steaming or sauteing them. Add frozen vegetables to soups, stews, pasta, or rice. Add nuts or seeds for added healthy fat at each meal. You can add these to yogurt, salads, or vegetable dishes. Marinate fish or vegetables using olive oil, lemon juice, garlic, and fresh herbs. Meal planning  Plan to eat 1 vegetarian meal one day each week. Try to work up to 2 vegetarian meals, if possible. Eat seafood 2 or more times a week. Have healthy snacks readily available, such as: Vegetable sticks with hummus. Greek yogurt. Fruit and nut trail mix. Eat balanced meals throughout the week. This includes: Fruit: 2-3 servings a day Vegetables: 4-5 servings a day Low-fat dairy: 2 servings a day Fish, poultry, or lean meat: 1 serving a day Beans and legumes: 2 or more servings a week Nuts and seeds: 1-2 servings a day Whole grains: 6-8 servings a day Extra-virgin olive oil: 3-4 servings a day Limit red meat and sweets to only a few servings a month What are my food choices? Mediterranean diet Recommended Grains: Whole-grain pasta. Brown rice. Bulgar wheat. Polenta. Couscous. Whole-wheat bread. [place]. Vegetables: Artichokes. Beets. Broccoli. Cabbage. Carrots. Eggplant. Green beans. Chard. Kale. Spinach. Onions. Leeks. Peas. Squash. Tomatoes. Peppers. Radishes. Fruits: Apples. Apricots. Avocado. Berries. Bananas. Cherries. Dates. Figs. Grapes. Lemons. Melon. Oranges. Peaches. Plums. Pomegranate. Meats and other protein  foods: Beans. Almonds. Sunflower seeds. Pine nuts. Peanuts. [place]. Salmon. Scallops. Shrimp. [place]. Tilapia. Clams. Oysters. Eggs. Dairy: Low-fat milk. Cheese. Greek yogurt. Beverages: Water. Red wine. Herbal tea. Fats and oils: Extra virgin olive oil. Avocado oil. Grape seed oil. Sweets and desserts: [place] yogurt with honey. Baked apples. Poached pears. Trail mix. Seasoning and other foods: Basil. Cilantro. Coriander. Cumin. Mint. Parsley. Sage. Rosemary. Tarragon. Garlic. Oregano. Thyme. Pepper. Balsalmic vinegar. Tahini. Hummus. Tomato sauce. Olives. Mushrooms. Limit these Grains: Prepackaged pasta or rice dishes. Prepackaged cereal with added sugar. Vegetables: Deep fried potatoes (french fries). Fruits: Fruit canned in syrup. Meats and other protein foods: Beef. Pork. Lamb. Poultry with skin. Hot dogs. [place] Salines. Dairy: Ice cream. Sour cream. Whole milk. Beverages: Juice. Sugar-sweetened soft drinks. Beer. Liquor and spirits. Fats and oils: Butter. Canola oil. Vegetable oil. Beef fat (tallow). Lard. Sweets and desserts: Cookies. Cakes. Pies. Candy. Seasoning and other foods: Mayonnaise. Premade sauces and marinades. The items listed may not be a complete list. Talk with your dietitian about what dietary choices are right for you. Summary The Mediterranean diet includes both food and lifestyle choices. Eat a variety of fresh fruits and vegetables, beans, nuts, seeds, and whole grains. Limit the amount of red meat and sweets that you eat. Talk with  your health care provider about whether it is safe for you to drink red wine in moderation. This means 1 glass a day for nonpregnant women and 2 glasses a day for men. A glass of wine equals 5 oz (150 mL). This information is not intended to replace advice given to you by your health care provider. Make sure you discuss any questions you have with your health care provider. Document Released: 08/09/2016 Document Revised: 09/11/2016 Document Reviewed:  08/09/2016 Elsevier Interactive Patient Education  2017 [employer].

## 2022-10-27 ENCOUNTER — Encounter: Payer: Self-pay | Admitting: Family Medicine

## 2022-10-29 ENCOUNTER — Other Ambulatory Visit: Payer: Self-pay | Admitting: Family Medicine

## 2022-10-29 MED ORDER — OLANZAPINE 10 MG PO TABS: 10.0000 mg | ORAL_TABLET | Freq: Every day | ORAL | 1 refills | Status: DC

## 2022-11-05 ENCOUNTER — Other Ambulatory Visit: Payer: Self-pay | Admitting: Family Medicine

## 2022-11-05 MED ORDER — SERTRALINE HCL 100 MG PO TABS: 200.0000 mg | ORAL_TABLET | Freq: Every day | ORAL | 1 refills | Status: DC

## 2022-11-06 ENCOUNTER — Ambulatory Visit
Admission: RE | Admit: 2022-11-06 | Discharge: 2022-11-06 | Disposition: A | Discharge status: Home / Self Care | Payer: Medicare Other | Source: Ambulatory Visit | Attending: Family Medicine | Admitting: Family Medicine

## 2022-11-06 ENCOUNTER — Other Ambulatory Visit: Payer: Self-pay

## 2022-11-06 ENCOUNTER — Emergency Department (HOSPITAL_COMMUNITY): Payer: Medicare Other

## 2022-11-06 ENCOUNTER — Observation Stay (HOSPITAL_COMMUNITY)
Admission: EM | Admit: 2022-11-06 | Discharge: 2022-11-08 | Disposition: A | Discharge status: Home Health | Payer: Medicare Other | Attending: Internal Medicine | Admitting: Internal Medicine

## 2022-11-06 ENCOUNTER — Observation Stay (HOSPITAL_COMMUNITY): Payer: Medicare Other

## 2022-11-06 ENCOUNTER — Encounter (HOSPITAL_COMMUNITY): Payer: Self-pay

## 2022-11-06 VITALS — BP 138/81 | HR 86 | Temp 98.2°F

## 2022-11-06 DIAGNOSIS — R410 Disorientation, unspecified: Secondary | ICD-10-CM

## 2022-11-06 DIAGNOSIS — R4182 Altered mental status, unspecified: Principal | ICD-10-CM | POA: Insufficient documentation

## 2022-11-06 DIAGNOSIS — J45909 Unspecified asthma, uncomplicated: Secondary | ICD-10-CM | POA: Insufficient documentation

## 2022-11-06 DIAGNOSIS — Z7982 Long term (current) use of aspirin: Secondary | ICD-10-CM | POA: Insufficient documentation

## 2022-11-06 DIAGNOSIS — J9811 Atelectasis: Secondary | ICD-10-CM | POA: Diagnosis not present

## 2022-11-06 DIAGNOSIS — E669 Obesity, unspecified: Secondary | ICD-10-CM | POA: Diagnosis not present

## 2022-11-06 DIAGNOSIS — N1831 Chronic kidney disease, stage 3a: Secondary | ICD-10-CM | POA: Insufficient documentation

## 2022-11-06 DIAGNOSIS — L03114 Cellulitis of left upper limb: Secondary | ICD-10-CM | POA: Insufficient documentation

## 2022-11-06 DIAGNOSIS — F03918 Unspecified dementia, unspecified severity, with other behavioral disturbance: Secondary | ICD-10-CM | POA: Insufficient documentation

## 2022-11-06 DIAGNOSIS — I10 Essential (primary) hypertension: Secondary | ICD-10-CM | POA: Diagnosis present

## 2022-11-06 DIAGNOSIS — R5381 Other malaise: Secondary | ICD-10-CM | POA: Diagnosis not present

## 2022-11-06 DIAGNOSIS — I129 Hypertensive chronic kidney disease with stage 1 through stage 4 chronic kidney disease, or unspecified chronic kidney disease: Secondary | ICD-10-CM | POA: Diagnosis not present

## 2022-11-06 DIAGNOSIS — Z79899 Other long term (current) drug therapy: Secondary | ICD-10-CM | POA: Diagnosis not present

## 2022-11-06 DIAGNOSIS — Z853 Personal history of malignant neoplasm of breast: Secondary | ICD-10-CM | POA: Diagnosis not present

## 2022-11-06 HISTORY — DX: Malignant (primary) neoplasm, unspecified: C80.1

## 2022-11-06 HISTORY — DX: Unspecified dementia, unspecified severity, without behavioral disturbance, psychotic disturbance, mood disturbance, and anxiety: F03.90

## 2022-11-06 LAB — COMPREHENSIVE METABOLIC PANEL
ALT: 22 U/L (ref 0–44)
AST: 39 U/L (ref 15–41)
Albumin: 3.6 g/dL (ref 3.5–5.0)
Alkaline Phosphatase: 81 U/L (ref 38–126)
Anion gap: 7 (ref 5–15)
BUN: 13 mg/dL (ref 8–23)
CO2: 25 mmol/L (ref 22–32)
Calcium: 8.7 mg/dL — ABNORMAL LOW (ref 8.9–10.3)
Chloride: 103 mmol/L (ref 98–111)
Creatinine, Ser: 0.98 mg/dL (ref 0.44–1.00)
GFR, Estimated: >60 mL/min (ref >60)
Glucose, Bld: 82 mg/dL (ref 70–99)
Potassium: 3.9 mmol/L (ref 3.5–5.1)
Sodium: 135 mmol/L (ref 135–145)
Total Bilirubin: 0.6 mg/dL (ref 0.3–1.2)
Total Protein: 8 g/dL (ref 6.5–8.1)

## 2022-11-06 LAB — URINALYSIS, ROUTINE W REFLEX MICROSCOPIC
Bilirubin Urine: NEGATIVE
Glucose, UA: NEGATIVE mg/dL
Ketones, ur: NEGATIVE mg/dL
Leukocytes,Ua: NEGATIVE
Nitrite: NEGATIVE
Protein, ur: NEGATIVE mg/dL
Specific Gravity, Urine: 1.004 — ABNORMAL LOW (ref 1.005–1.030)
pH: 7 (ref 5.0–8.0)

## 2022-11-06 LAB — CBC
HCT: 36.3 % (ref 36.0–46.0)
Hemoglobin: 11.7 g/dL — ABNORMAL LOW (ref 12.0–15.0)
MCH: 28.6 pg (ref 26.0–34.0)
MCHC: 32.2 g/dL (ref 30.0–36.0)
MCV: 88.8 fL (ref 80.0–100.0)
Platelets: 244 10*3/uL (ref 150–400)
RBC: 4.09 MIL/uL (ref 3.87–5.11)
RDW: 16.4 % — ABNORMAL HIGH (ref 11.5–15.5)
WBC: 9.8 10*3/uL (ref 4.0–10.5)
nRBC: 0 % (ref 0.0–0.2)

## 2022-11-06 LAB — RAPID URINE DRUG SCREEN, HOSP PERFORMED
Amphetamines: NOT DETECTED
Barbiturates: NOT DETECTED
Benzodiazepines: NOT DETECTED
Cocaine: NOT DETECTED
Opiates: NOT DETECTED
Tetrahydrocannabinol: NOT DETECTED

## 2022-11-06 LAB — CBG MONITORING, ED: Glucose-Capillary: 101 mg/dL — ABNORMAL HIGH (ref 70–99)

## 2022-11-06 LAB — PROCALCITONIN: Procalcitonin: <0.1 ng/mL

## 2022-11-06 MED ORDER — HYDRALAZINE HCL 25 MG PO TABS: 25.0000 mg | ORAL_TABLET | Freq: Four times a day (QID) | ORAL | Status: DC | PRN

## 2022-11-06 MED ORDER — AMLODIPINE BESYLATE 5 MG PO TABS
5.0000 mg | ORAL_TABLET | Freq: Every day | ORAL | Status: DC
  Administered 2022-11-06 – 2022-11-08 (×3): 5 mg via ORAL
  Filled 2022-11-06 (×3): qty 1

## 2022-11-06 MED ORDER — ENOXAPARIN SODIUM 40 MG/0.4ML IJ SOSY
40.0000 mg | PREFILLED_SYRINGE | INTRAMUSCULAR | Status: DC
  Administered 2022-11-06 – 2022-11-07 (×2): 40 mg via SUBCUTANEOUS
  Filled 2022-11-06 (×2): qty 0.4

## 2022-11-06 MED ORDER — SODIUM CHLORIDE 0.9 % IV BOLUS
1000.0000 mL | Freq: Once | INTRAVENOUS | Status: AC
  Administered 2022-11-06: 1000 mL via INTRAVENOUS

## 2022-11-06 MED ORDER — DEXTROSE-NACL 5-0.45 % IV SOLN: INTRAVENOUS | Status: AC

## 2022-11-06 MED ORDER — ONDANSETRON HCL 4 MG PO TABS: 4.0000 mg | ORAL_TABLET | Freq: Four times a day (QID) | ORAL | Status: DC | PRN

## 2022-11-06 MED ORDER — ANASTROZOLE 1 MG PO TABS
1.0000 mg | ORAL_TABLET | Freq: Every day | ORAL | Status: DC
  Administered 2022-11-07 – 2022-11-08 (×2): 1 mg via ORAL
  Filled 2022-11-06 (×4): qty 1

## 2022-11-06 MED ORDER — THIAMINE MONONITRATE 100 MG PO TABS
100.0000 mg | ORAL_TABLET | Freq: Every day | ORAL | Status: DC
  Administered 2022-11-06 – 2022-11-08 (×3): 100 mg via ORAL
  Filled 2022-11-06 (×3): qty 1

## 2022-11-06 MED ORDER — ONDANSETRON HCL 4 MG/2ML IJ SOLN: 4.0000 mg | Freq: Four times a day (QID) | INTRAMUSCULAR | Status: DC | PRN

## 2022-11-06 MED ORDER — OLANZAPINE 5 MG PO TABS
10.0000 mg | ORAL_TABLET | Freq: Every day | ORAL | Status: DC
  Administered 2022-11-06 – 2022-11-07 (×2): 10 mg via ORAL
  Filled 2022-11-06 (×2): qty 2

## 2022-11-06 MED ORDER — ASPIRIN 81 MG PO CHEW
81.0000 mg | CHEWABLE_TABLET | Freq: Every day | ORAL | Status: DC
  Administered 2022-11-07 – 2022-11-08 (×2): 81 mg via ORAL
  Filled 2022-11-06 (×2): qty 1

## 2022-11-06 MED ORDER — CEPHALEXIN 500 MG PO CAPS
500.0000 mg | ORAL_CAPSULE | Freq: Two times a day (BID) | ORAL | Status: DC
  Administered 2022-11-06 – 2022-11-08 (×4): 500 mg via ORAL
  Filled 2022-11-06 (×4): qty 1

## 2022-11-06 MED ORDER — LORATADINE 10 MG PO TABS
10.0000 mg | ORAL_TABLET | Freq: Every day | ORAL | Status: DC
  Administered 2022-11-07 – 2022-11-08 (×2): 10 mg via ORAL
  Filled 2022-11-06 (×2): qty 1

## 2022-11-06 NOTE — H&P (Signed)
TRH H&P    Patient Demographics:    Amyiah Gaba, is a 72 y.o. female  MRN: [id number]  DOB - 16-Sep-1950  Admit Date - 11/06/2022  Referring MD/NP/PA: Evalee Jefferson  Outpatient Primary MD for the patient is Coral Spikes, DO  Patient coming from: Home  Chief complaint-altered mental status   HPI:    Whitney Velez  is a 72 y.o. female, with history of breast cancer, status postchemotherapy in 2018, hypertension, CKD stage III, dementia who lives with her daughter and son-in-law, was brought to hospital for worsening confusion.  Patient has dementia, and is confused at baseline, however patient's daughter noted that on Monday morning patient was more confused than usual, required 2 person assist to walk around.  She was dropped at adult daycare facility where she was unable to eat and was sent back as she was more confused.  Patient's daughter called her PCP who increased the dose of Zyprexa to 10 mg and increase the dose of Zoloft to 200 mg daily.  Patient's daughter noted that patient has been more agitated however she feels that her confusion has somewhat improved. There has been no history of fever, no cough.  No nausea vomiting or diarrhea. Patient denies abdominal pain or chest pain. Patient is oriented to self only In the ED CT head was negative for acute abnormality, UA was clear, chest x-ray showed possibility of atelectasis versus infiltrate. WBC is normal, patient is afebrile. Patient fell 2 days ago and has a bruise on left elbow.    Review of systems:    In addition to the HPI above,    All other systems reviewed and are negative.    Past History of the following :    Past Medical History:  Diagnosis Date   Asthma    Cancer ([place])    Dementia ([place])    Hypertension       Past Surgical History:  Procedure Laterality Date   ABDOMINAL HYSTERECTOMY     MASTECTOMY MODIFIED RADICAL Right  12/01/2018   Procedure: RIGHT MODIFIED RADICAL MASTECTOMY (Procedure #1);  Surgeon: Aviva Signs, MD;  Location: [facility];  Service: General;  Laterality: Right;   PORTACATH PLACEMENT Left 06/04/2018   Procedure: INSERTION PORT-A-CATH;  Surgeon: Aviva Signs, MD;  Location: [facility];  Service: General;  Laterality: Left;   SIMPLE MASTECTOMY WITH AXILLARY SENTINEL NODE BIOPSY Left 12/01/2018   Procedure: LEFT SIMPLE MASTECTOMY (Procedure #2);  Surgeon: Aviva Signs, MD;  Location: [facility];  Service: General;  Laterality: Left;      Social History:      Social History   Tobacco Use   Smoking status: Never   Smokeless tobacco: Never  Substance Use Topics   Alcohol use: Never       Family History :   Patient's family history is unknown   Home Medications:   Prior to Admission medications   Medication Sig Start Date End Date Taking? Authorizing Provider  albuterol (VENTOLIN HFA) 108 (90 Base) MCG/ACT inhaler INHALE TWO PUFFS INTO THE LUNGS EVERY SIX  HOURS AS NEEDED FOR WHEEZING 11/08/20  Yes Lovena Le, Malena M, DO  anastrozole (ARIMIDEX) 1 MG tablet TAKE ONE (1) TABLET BY MOUTH EVERY DAY 01/18/22  Yes Derek Jack, MD  aspirin 81 MG tablet Take 81 mg by mouth daily.   Yes [provider]  Calcium Carb-Cholecalciferol (CALCIUM + VITAMIN D3 PO) Take 1 tablet by mouth 2 (two) times daily.   Yes [provider]  cetirizine (ZYRTEC) 10 MG tablet Take 10 mg by mouth daily.   Yes [provider]  denosumab (PROLIA) 60 MG/ML SOSY injection Inject 60 mg into the skin every 6 (six) months.   Yes [provider]  ibuprofen (ADVIL) 200 MG tablet Take 200 mg by mouth at bedtime as needed.   Yes [provider]  montelukast (SINGULAIR) 10 MG tablet TAKE ONE TABLET ('10MG'$  TOTAL) BY MOUTH ATBEDTIME 09/14/22  Yes Cook, Jayce G, DO  OLANZapine (ZYPREXA) 10 MG tablet Take 1 tablet (10 mg total) by mouth at bedtime. 10/29/22  Yes Cook, Jayce G, DO  potassium  chloride (KLOR-CON) 10 MEQ tablet TAKE TWO (2) TABLETS BY MOUTH DAILY 08/15/22  Yes Derek Jack, MD  sertraline (ZOLOFT) 100 MG tablet Take 2 tablets (200 mg total) by mouth daily. 11/05/22  Yes Coral Spikes, DO     Allergies:     Allergies  Allergen Reactions   Penicillins Other (See Comments)    Nausea, vomiting, dizziness Has patient had a PCN reaction causing immediate rash, facial/tongue/throat swelling, SOB or lightheadedness with hypotension: no Has patient had a PCN reaction causing severe rash involving mucus membranes or skin necrosis: no Has patient had a PCN reaction that required hospitalization: no Has patient had a PCN reaction occurring within the last 10 years: no If all of the above answers are "NO", then may proceed with Cephalosporin use.    Percocet [Oxycodone-Acetaminophen] Diarrhea and Nausea And Vomiting   Ranitidine Itching     Physical Exam:   Vitals  Blood pressure (!) 153/82, pulse 66, temperature 98.1 F (36.7 C), temperature source Oral, resp. rate 14, height '4\' 11"'$  (1.499 m), weight 76.2 kg, SpO2 98 %.  1.  General: Appears in no acute distress  2. Psychiatric: Alert, oriented to self only  3. Neurologic: Cranial nerves II through XII grossly intact, moving all extremities  4. HEENMT:  Atraumatic normocephalic, extraocular muscles are intact  5. Respiratory : Clear to auscultation bilaterally  6. Cardiovascular : S1-S2, regular, no murmur auscultated  7. Gastrointestinal:  Abdomen is soft, nontender, no organomegaly  8. Skin:  Dried eschar noted on the left elbow, skin is mildly warm to touch around elbow       Data Review:    CBC Recent Labs  Lab 11/06/22 1232  WBC 9.8  HGB 11.7*  HCT 36.3  PLT 244  MCV 88.8  MCH 28.6  MCHC 32.2  RDW 16.4*   ------------------------------------------------------------------------------------------------------------------  Results for orders placed or performed during the  hospital encounter of 11/06/22 (from the past 48 hour(s))  CBG monitoring, ED     Status: Abnormal   Collection Time: 11/06/22 12:31 PM  Result Value Ref Range   Glucose-Capillary 101 (H) 70 - 99 mg/dL    Comment: Glucose reference range applies only to samples taken after fasting for at least 8 hours.  Comprehensive metabolic panel     Status: Abnormal   Collection Time: 11/06/22 12:32 PM  Result Value Ref Range   Sodium 135 135 - 145 mmol/L  Potassium 3.9 3.5 - 5.1 mmol/L   Chloride 103 98 - 111 mmol/L   CO2 25 22 - 32 mmol/L   Glucose, Bld 82 70 - 99 mg/dL    Comment: Glucose reference range applies only to samples taken after fasting for at least 8 hours.   BUN 13 8 - 23 mg/dL   Creatinine, Ser 0.98 0.44 - 1.00 mg/dL   Calcium 8.7 (L) 8.9 - 10.3 mg/dL   Total Protein 8.0 6.5 - 8.1 g/dL   Albumin 3.6 3.5 - 5.0 g/dL   AST 39 15 - 41 U/L   ALT 22 0 - 44 U/L   Alkaline Phosphatase 81 38 - 126 U/L   Total Bilirubin 0.6 0.3 - 1.2 mg/dL   GFR, Estimated >60 >60 mL/min    Comment: (NOTE) Calculated using the CKD-EPI Creatinine Equation (2021)    Anion gap 7 5 - 15    Comment: Performed at [facility], [address], [place], [place] [address]  CBC     Status: Abnormal   Collection Time: 11/06/22 12:32 PM  Result Value Ref Range   WBC 9.8 4.0 - 10.5 K/uL   RBC 4.09 3.87 - 5.11 MIL/uL   Hemoglobin 11.7 (L) 12.0 - 15.0 g/dL   HCT 36.3 36.0 - 46.0 %   MCV 88.8 80.0 - 100.0 fL   MCH 28.6 26.0 - 34.0 pg   MCHC 32.2 30.0 - 36.0 g/dL   RDW 16.4 (H) 11.5 - 15.5 %   Platelets 244 150 - 400 K/uL   nRBC 0.0 0.0 - 0.2 %    Comment: Performed at [facility], [address] [place], [place] [address]  Urinalysis, Routine w reflex microscopic Urine, In & Out Cath     Status: Abnormal   Collection Time: 11/06/22 12:53 PM  Result Value Ref Range   Color, Urine STRAW (A) YELLOW   APPearance CLEAR CLEAR   Specific Gravity, Urine 1.004 (L) 1.005 - 1.030   pH 7.0 5.0 - 8.0   Glucose, UA  NEGATIVE NEGATIVE mg/dL   Hgb urine dipstick SMALL (A) NEGATIVE   Bilirubin Urine NEGATIVE NEGATIVE   Ketones, ur NEGATIVE NEGATIVE mg/dL   Protein, ur NEGATIVE NEGATIVE mg/dL   Nitrite NEGATIVE NEGATIVE   Leukocytes,Ua NEGATIVE NEGATIVE   WBC, UA 0-5 0 - 5 WBC/hpf   Bacteria, UA RARE (A) NONE SEEN   Hyaline Casts, UA PRESENT     Comment: Performed at [facility], [address] [place], [place] [address]  Rapid urine drug screen (hospital performed)     Status: None   Collection Time: 11/06/22 12:53 PM  Result Value Ref Range   Opiates NONE DETECTED NONE DETECTED   Cocaine NONE DETECTED NONE DETECTED   Benzodiazepines NONE DETECTED NONE DETECTED   Amphetamines NONE DETECTED NONE DETECTED   Tetrahydrocannabinol NONE DETECTED NONE DETECTED   Barbiturates NONE DETECTED NONE DETECTED    Comment: (NOTE) DRUG SCREEN FOR MEDICAL PURPOSES ONLY.  IF CONFIRMATION IS NEEDED FOR ANY PURPOSE, NOTIFY LAB WITHIN 5 DAYS.  LOWEST DETECTABLE LIMITS FOR URINE DRUG SCREEN Drug Class                     Cutoff (ng/mL) Amphetamine and metabolites    1000 Barbiturate and metabolites    200 Benzodiazepine                 200 Opiates and metabolites        300 Cocaine and metabolites  300 THC                            50 Performed at [facility], [address] [place], [place] [address]     Chemistries  Recent Labs  Lab 11/06/22 1232  NA 135  K 3.9  CL 103  CO2 25  GLUCOSE 82  BUN 13  CREATININE 0.98  CALCIUM 8.7*  AST 39  ALT 22  ALKPHOS 81  BILITOT 0.6   ------------------------------------------------------------------------------------------------------------------  ------------------------------------------------------------------------------------------------------------------ GFR: Estimated Creatinine Clearance: 44.8 mL/min (by C-G formula based on SCr of 0.98 mg/dL). Liver Function Tests: Recent Labs  Lab 11/06/22 1232  AST 39  ALT 22  ALKPHOS 81  BILITOT  0.6  PROT 8.0  ALBUMIN 3.6   No results for input(s): "LIPASE", "AMYLASE" in the last 168 hours. No results for input(s): "AMMONIA" in the last 168 hours. Coagulation Profile: No results for input(s): "INR", "PROTIME" in the last 168 hours. Cardiac Enzymes: No results for input(s): "CKTOTAL", "CKMB", "CKMBINDEX", "TROPONINI" in the last 168 hours. BNP (last 3 results) No results for input(s): "PROBNP" in the last 8760 hours. HbA1C: No results for input(s): "HGBA1C" in the last 72 hours. CBG: Recent Labs  Lab 11/06/22 1231  GLUCAP 101*   Lipid Profile: No results for input(s): "CHOL", "HDL", "LDLCALC", "TRIG", "CHOLHDL", "LDLDIRECT" in the last 72 hours. Thyroid Function Tests: No results for input(s): "TSH", "T4TOTAL", "FREET4", "T3FREE", "THYROIDAB" in the last 72 hours. Anemia Panel: No results for input(s): "VITAMINB12", "FOLATE", "FERRITIN", "TIBC", "IRON", "RETICCTPCT" in the last 72 hours.  --------------------------------------------------------------------------------------------------------------- Urine analysis:    Component Value Date/Time   COLORURINE STRAW (A) 11/06/2022 1253   APPEARANCEUR CLEAR 11/06/2022 1253   LABSPEC 1.004 (L) 11/06/2022 1253   PHURINE 7.0 11/06/2022 1253   GLUCOSEU NEGATIVE 11/06/2022 1253   HGBUR SMALL (A) 11/06/2022 1253   BILIRUBINUR NEGATIVE 11/06/2022 1253   KETONESUR NEGATIVE 11/06/2022 1253   PROTEINUR NEGATIVE 11/06/2022 1253   NITRITE NEGATIVE 11/06/2022 1253   LEUKOCYTESUR NEGATIVE 11/06/2022 1253      Imaging Results:    DG Chest Portable 1 View  Result Date: 11/06/2022 CLINICAL DATA:  Altered mental status EXAM: PORTABLE CHEST 1 VIEW COMPARISON:  Chest radiograph dated 06/06/2022 FINDINGS: Lines/tubes: Left chest wall port tip projects over the SVC. Chest: Low lung volumes.  Bibasilar patchy opacities. Pleura: No pneumothorax or pleural effusion. Heart/mediastinum: Enlarged cardiomediastinal silhouette is likely  projectional. Aortic atherosclerosis. Bones: Surgical clips project over the bilateral axillary regions. IMPRESSION: 1. Low lung volumes with bibasilar patchy opacities, likely atelectasis. Aspiration or pneumonia can be considered in the appropriate clinical setting. 2.  Aortic Atherosclerosis (ICD10-I70.0). Electronically Signed   By: Darrin Nipper M.D.   On: 11/06/2022 14:09   CT Head Wo Contrast  Result Date: 11/06/2022 CLINICAL DATA:  Altered mental status, dementia EXAM: CT HEAD WITHOUT CONTRAST TECHNIQUE: Contiguous axial images were obtained from the base of the skull through the vertex without intravenous contrast. RADIATION DOSE REDUCTION: This exam was performed according to the departmental dose-optimization program which includes automated exposure control, adjustment of the mA and/or kV according to patient size and/or use of iterative reconstruction technique. COMPARISON:  CT head 07/08/2021 FINDINGS: Brain: There is no acute intracranial hemorrhage, extra-axial fluid collection, or acute infarct. There is dilation of the ventricular system which is out of proportion to the degree of sulcal widening, similar to the study from 2022. There is confluent hypodensity throughout the supratentorial  white matter which is nonspecific but may reflect sequela of advanced chronic small vessel ischemic change and superimposed posttreatment change, also stable. An expanded and partially empty sella is unchanged there is no mass lesion. There is no mass effect or midline shift. Vascular: No hyperdense vessel or unexpected calcification. Skull: There is calcification of the bilateral carotid siphons. Sinuses/Orbits: The imaged paranasal sinuses are clear. The imaged globes and orbits are unremarkable. Other: None. IMPRESSION: 1. No acute intracranial pathology. 2. Unchanged parenchymal volume loss and white matter hypodensity likely reflecting a combination of chronic small-vessel ischemic change and posttreatment  change. Electronically Signed   By: Valetta Mole M.D.   On: 11/06/2022 13:32    My personal review of EKG: Rhythm NSR, nonspecific T wave changes   Assessment & Plan:    Principal Problem:   Altered mental status   Altered mental status-unclear etiology, UA is clear, chest x-ray shows infiltrate versus atelectasis.  Dose of Zoloft was increased to 200 mg which can cause agitation.  We will hold Zoloft at this time.  We will continue with Zyprexa.  Start thiamine 100 mg daily, will check MRI brain in a.m.  CT head was negative for acute abnormality. ?  Pneumonia-chest x-ray showed infiltrate versus atelectasis at lung bases bilaterally.  Clinically she does not have any signs and symptoms of pneumonia, no history of cough, not requiring oxygen, no history of fever.  White count is normal.  We will check procalcitonin. Mild cellulitis-she has mild warmth to touch and tenderness in left elbow where she had abrasion from fall.  We will start Keflex 500 mg p.o. twice daily Hypertension-blood pressure is elevated, will start amlodipine 5 mg daily, hydralazine 25 mg p.o. every 6 hours as needed for BP more than 160/100. History of breast cancer-continue Arimidex    DVT Prophylaxis-   Lovenox   AM Labs Ordered, also please review Full Orders  Family Communication: Admission, patients condition and plan of care including tests being ordered have been discussed with the patient's daughter at bedside who indicate understanding and agree with the plan and Code Status.  Code Status: DNR  Admission status: Observation  Time spent in minutes : 60 minutes   Jessika Rothery S Attikus Bartoszek M.D

## 2022-11-06 NOTE — ED Notes (Signed)
Patient transferred to CT for scan.

## 2022-11-06 NOTE — ED Provider Notes (Signed)
[facility] EMERGENCY DEPARTMENT Provider Note   CSN: [id number] Arrival date & time: 11/06/22  1222     History  Chief Complaint  Patient presents with   Altered Mental Status    Whitney Velez is a 72 y.o. female with a history including hypertension, chronic kidney disease stage III, dementia who lives with her daughter and son-in-law who provide her care, here with the 2-day history of rather abrupt change in mental status.  Daughter at bedside states that she normally is ambulatory, can perform some of her toileting with assistance and can feed herself, but over the past 48 hours she has been increasingly agitated, much more vocal than her baseline and has been unable to feed herself or ambulate.  She does attend adult daycare but is typically in a wheelchair while at daycare as she is a fall risk, today at daycare she was unable to participate in any activities and was advised to present here for further evaluation.  Per daughter at the bedside she has not had any obvious fevers, cough, vomiting or diarrhea, no obvious dysuria or increased urinary frequency.  No recent falls or head injury.  She had a phone consult with PCP yesterday and her Zyprexa and Zoloft were increased and daughter has noticed mild improvement in her agitation today.    The history is provided by a relative and a caregiver (daughter and son in law at bedside, primary caregivers).       Home Medications Prior to Admission medications   Medication Sig Start Date End Date Taking? Authorizing Provider  albuterol (VENTOLIN HFA) 108 (90 Base) MCG/ACT inhaler INHALE TWO PUFFS INTO THE LUNGS EVERY SIX HOURS AS NEEDED FOR WHEEZING 11/08/20  Yes Lovena Le, Malena M, DO  anastrozole (ARIMIDEX) 1 MG tablet TAKE ONE (1) TABLET BY MOUTH EVERY DAY 01/18/22  Yes Derek Jack, MD  aspirin 81 MG tablet Take 81 mg by mouth daily.   Yes [provider]  Calcium Carb-Cholecalciferol (CALCIUM + VITAMIN D3 PO) Take 1  tablet by mouth 2 (two) times daily.   Yes [provider]  cetirizine (ZYRTEC) 10 MG tablet Take 10 mg by mouth daily.   Yes [provider]  denosumab (PROLIA) 60 MG/ML SOSY injection Inject 60 mg into the skin every 6 (six) months.   Yes [provider]  ibuprofen (ADVIL) 200 MG tablet Take 200 mg by mouth at bedtime as needed.   Yes [provider]  montelukast (SINGULAIR) 10 MG tablet TAKE ONE TABLET ('10MG'$  TOTAL) BY MOUTH ATBEDTIME 09/14/22  Yes Cook, Jayce G, DO  OLANZapine (ZYPREXA) 10 MG tablet Take 1 tablet (10 mg total) by mouth at bedtime. 10/29/22  Yes Cook, Jayce G, DO  potassium chloride (KLOR-CON) 10 MEQ tablet TAKE TWO (2) TABLETS BY MOUTH DAILY 08/15/22  Yes Derek Jack, MD  sertraline (ZOLOFT) 100 MG tablet Take 2 tablets (200 mg total) by mouth daily. 11/05/22  Yes Coral Spikes, DO      Allergies    Penicillins, Percocet [oxycodone-acetaminophen], and Ranitidine    Review of Systems   Review of Systems  Unable to perform ROS: Dementia  Constitutional:  Negative for fever.  Respiratory:  Negative for cough.   Gastrointestinal:  Negative for diarrhea.  Neurological:  Positive for weakness.  Psychiatric/Behavioral:  Positive for agitation.     Physical Exam Updated Vital Signs BP (!) 166/86   Pulse 63   Temp 98 F (36.7 C) (Oral)   Resp 19  Ht '4\' 11"'$  (1.499 m)   Wt 76.2 kg   SpO2 98%   BMI 33.93 kg/m  Physical Exam Vitals and nursing note reviewed.  Constitutional:      Appearance: She is well-developed.  HENT:     Head: Normocephalic and atraumatic.  Eyes:     Conjunctiva/sclera: Conjunctivae normal.     Pupils: Pupils are equal, round, and reactive to light.  Cardiovascular:     Rate and Rhythm: Normal rate and regular rhythm.     Heart sounds: Normal heart sounds.  Pulmonary:     Effort: Pulmonary effort is normal.     Breath sounds: Normal breath sounds. No wheezing or rhonchi.  Abdominal:     General:  Bowel sounds are normal.     Palpations: Abdomen is soft.     Tenderness: There is no abdominal tenderness. There is no guarding.  Musculoskeletal:        General: Normal range of motion.     Cervical back: Normal range of motion.  Skin:    General: Skin is warm and dry.  Neurological:     Mental Status: She is alert. She is disoriented.     Comments: Patient is able to move all 4 extremities.  She is not cooperative for a full neurologic exam, but does respond to simple commands.  Psychiatric:        Behavior: Behavior is agitated.        Cognition and Memory: Cognition is impaired.     ED Results / Procedures / Treatments   Labs (all labs ordered are listed, but only abnormal results are displayed) Labs Reviewed  COMPREHENSIVE METABOLIC PANEL - Abnormal; Notable for the following components:      Result Value   Calcium 8.7 (*)    All other components within normal limits  CBC - Abnormal; Notable for the following components:   Hemoglobin 11.7 (*)    RDW 16.4 (*)    All other components within normal limits  URINALYSIS, ROUTINE W REFLEX MICROSCOPIC - Abnormal; Notable for the following components:   Color, Urine STRAW (*)    Specific Gravity, Urine 1.004 (*)    Hgb urine dipstick SMALL (*)    Bacteria, UA RARE (*)    All other components within normal limits  CBG MONITORING, ED - Abnormal; Notable for the following components:   Glucose-Capillary 101 (*)    All other components within normal limits  RAPID URINE DRUG SCREEN, HOSP PERFORMED    EKG None  Radiology DG Chest Portable 1 View  Result Date: 11/06/2022 CLINICAL DATA:  Altered mental status EXAM: PORTABLE CHEST 1 VIEW COMPARISON:  Chest radiograph dated 06/06/2022 FINDINGS: Lines/tubes: Left chest wall port tip projects over the SVC. Chest: Low lung volumes.  Bibasilar patchy opacities. Pleura: No pneumothorax or pleural effusion. Heart/mediastinum: Enlarged cardiomediastinal silhouette is likely projectional.  Aortic atherosclerosis. Bones: Surgical clips project over the bilateral axillary regions. IMPRESSION: 1. Low lung volumes with bibasilar patchy opacities, likely atelectasis. Aspiration or pneumonia can be considered in the appropriate clinical setting. 2.  Aortic Atherosclerosis (ICD10-I70.0). Electronically Signed   By: Darrin Nipper M.D.   On: 11/06/2022 14:09   CT Head Wo Contrast  Result Date: 11/06/2022 CLINICAL DATA:  Altered mental status, dementia EXAM: CT HEAD WITHOUT CONTRAST TECHNIQUE: Contiguous axial images were obtained from the base of the skull through the vertex without intravenous contrast. RADIATION DOSE REDUCTION: This exam was performed according to the departmental dose-optimization program which includes automated exposure  control, adjustment of the mA and/or kV according to patient size and/or use of iterative reconstruction technique. COMPARISON:  CT head 07/08/2021 FINDINGS: Brain: There is no acute intracranial hemorrhage, extra-axial fluid collection, or acute infarct. There is dilation of the ventricular system which is out of proportion to the degree of sulcal widening, similar to the study from 2022. There is confluent hypodensity throughout the supratentorial white matter which is nonspecific but may reflect sequela of advanced chronic small vessel ischemic change and superimposed posttreatment change, also stable. An expanded and partially empty sella is unchanged there is no mass lesion. There is no mass effect or midline shift. Vascular: No hyperdense vessel or unexpected calcification. Skull: There is calcification of the bilateral carotid siphons. Sinuses/Orbits: The imaged paranasal sinuses are clear. The imaged globes and orbits are unremarkable. Other: None. IMPRESSION: 1. No acute intracranial pathology. 2. Unchanged parenchymal volume loss and white matter hypodensity likely reflecting a combination of chronic small-vessel ischemic change and posttreatment change.  Electronically Signed   By: Valetta Mole M.D.   On: 11/06/2022 13:32    Procedures Procedures    Medications Ordered in ED Medications  sodium chloride 0.9 % bolus 1,000 mL (1,000 mLs Intravenous New Bag/Given 11/06/22 1424)    ED Course/ Medical Decision Making/ A&P                           Medical Decision Making Patient with a history of dementia at baseline but with significant change in mental status and agitation, occurring intact.  Relatively suddenly over the course of 48 hours suggesting acute delirium.  Amount and/or Complexity of Data Reviewed Labs: ordered.    Details: Labs are generally unremarkable, urinalysis is clean, electrolytes are normal except for a mild hypocalcemia at 8.7.  She has a normal WBC count of 9.8.  Glucose of 101. Radiology: ordered.    Details: CT head is negative for acute intracranial injury.  Chest x-ray suggest atelectasis versus aspiration pneumonia.  Patient has normal vital signs, she is not hypoxic, no complaints of cough or shortness of breath, no recent known choking episodes, doubt this reflects a pneumonia. Discussion of management or test interpretation with external provider(s): Patient with acute delirium.  Attempt to ambulate patient in the department was unsuccessful, legs very tremulous, but when sits down on side of bed, holds legs out in front of her, rigid, no focal weakness found.   Will benefit from admission.  Discussed with Dr. Darrick Meigs who will see pt in ed.  Risk Decision regarding hospitalization.           Final Clinical Impression(s) / ED Diagnoses Final diagnoses:  Acute delirium    Rx / DC Orders ED Discharge Orders     None         Landis Martins 11/06/22 1641    Kommor, Debe Coder, MD 11/07/22 [phone]

## 2022-11-06 NOTE — ED Triage Notes (Signed)
Per family member, pt has dementia and drastically increase in agitation and confusion last night. Pt was talking loud, unable to feed herself, acting confused, she can't stand, she was shaking today at the adult daycare. Per family they were told to have a urine test.

## 2022-11-06 NOTE — ED Notes (Signed)
Patient returned from CT

## 2022-11-06 NOTE — ED Notes (Signed)
Patient is being discharged from the Urgent Care and sent to the Emergency Department via private vehicle . Per NP, patient is in need of higher level of care due to delirium. Patient is aware and verbalizes understanding of plan of care.  Vitals:   11/06/22 1114  BP: 138/81  Pulse: 86  Temp: 98.2 F (36.8 C)  SpO2: 94%

## 2022-11-06 NOTE — ED Notes (Signed)
Patient unable to obtain urine specimen after multiple cups of water

## 2022-11-06 NOTE — Discharge Instructions (Signed)
Please go to the Emergency Room for further evaluation of Whitney Velez's symptoms

## 2022-11-06 NOTE — ED Triage Notes (Signed)
Per family member, patient has dementia and has been more agitated, confused since last night. Unable to feed herself, can't stand. Sunday last time she was completely normal.

## 2022-11-06 NOTE — ED Notes (Signed)
Report given to accepting unit 300.

## 2022-11-06 NOTE — ED Provider Notes (Signed)
Patient presents to urgent care today with daughter and son-in-law for acute delirium.  Daughter provides history and reports patient has a history of dementia at baseline, however over the past 24 to 36 hours, she has developed acute delirium.  She is talking louder than normal, unable to feed herself, bathe herself, clothe herself.  She has also been nonambulatory since this started.  She goes to adult daycare 4 days/week and one of the workers there told her that she needed to be evaluated prior to returning.  No known fevers, change in appetite.  Daughter reports they were able to feed her dinner last night, however they do not normally have to feed her.  While in urgent care today, patient drank multiple cups of water and was never able to give a urine sample.  She wears adult briefs at baseline.  Given acute delirium, unable to perform urinalysis testing, recommended further evaluation and work-up in emergency room.  Daughter and son are in agreement to this plan.  Patient safe to transport via private vehicle at this time.   Eulogio Bear, NP 11/06/22 1229

## 2022-11-07 DIAGNOSIS — R5381 Other malaise: Secondary | ICD-10-CM | POA: Diagnosis not present

## 2022-11-07 DIAGNOSIS — L03114 Cellulitis of left upper limb: Secondary | ICD-10-CM | POA: Diagnosis not present

## 2022-11-07 DIAGNOSIS — F03918 Unspecified dementia, unspecified severity, with other behavioral disturbance: Secondary | ICD-10-CM | POA: Diagnosis not present

## 2022-11-07 DIAGNOSIS — R4182 Altered mental status, unspecified: Secondary | ICD-10-CM | POA: Diagnosis not present

## 2022-11-07 DIAGNOSIS — N1831 Chronic kidney disease, stage 3a: Secondary | ICD-10-CM

## 2022-11-07 DIAGNOSIS — R410 Disorientation, unspecified: Secondary | ICD-10-CM | POA: Diagnosis not present

## 2022-11-07 DIAGNOSIS — I10 Essential (primary) hypertension: Secondary | ICD-10-CM | POA: Diagnosis not present

## 2022-11-07 LAB — COMPREHENSIVE METABOLIC PANEL
ALT: 23 U/L (ref 0–44)
AST: 42 U/L — ABNORMAL HIGH (ref 15–41)
Albumin: 3.7 g/dL (ref 3.5–5.0)
Alkaline Phosphatase: 79 U/L (ref 38–126)
Anion gap: 8 (ref 5–15)
BUN: 9 mg/dL (ref 8–23)
CO2: 24 mmol/L (ref 22–32)
Calcium: 8.4 mg/dL — ABNORMAL LOW (ref 8.9–10.3)
Chloride: 105 mmol/L (ref 98–111)
Creatinine, Ser: 0.85 mg/dL (ref 0.44–1.00)
GFR, Estimated: >60 mL/min (ref >60)
Glucose, Bld: 107 mg/dL — ABNORMAL HIGH (ref 70–99)
Potassium: 3.4 mmol/L — ABNORMAL LOW (ref 3.5–5.1)
Sodium: 137 mmol/L (ref 135–145)
Total Bilirubin: 0.5 mg/dL (ref 0.3–1.2)
Total Protein: 8 g/dL (ref 6.5–8.1)

## 2022-11-07 LAB — CBC
HCT: 37.3 % (ref 36.0–46.0)
Hemoglobin: 11.9 g/dL — ABNORMAL LOW (ref 12.0–15.0)
MCH: 28.6 pg (ref 26.0–34.0)
MCHC: 31.9 g/dL (ref 30.0–36.0)
MCV: 89.7 fL (ref 80.0–100.0)
Platelets: 240 10*3/uL (ref 150–400)
RBC: 4.16 MIL/uL (ref 3.87–5.11)
RDW: 16.5 % — ABNORMAL HIGH (ref 11.5–15.5)
WBC: 8.7 10*3/uL (ref 4.0–10.5)
nRBC: 0 % (ref 0.0–0.2)

## 2022-11-07 LAB — TSH: TSH: 0.783 u[IU]/mL (ref 0.350–4.500)

## 2022-11-07 LAB — VITAMIN B12: Vitamin B-12: 363 pg/mL (ref 180–914)

## 2022-11-07 MED ORDER — POTASSIUM CHLORIDE CRYS ER 20 MEQ PO TBCR
40.0000 meq | EXTENDED_RELEASE_TABLET | Freq: Once | ORAL | Status: AC
  Administered 2022-11-07: 40 meq via ORAL
  Filled 2022-11-07: qty 2

## 2022-11-07 NOTE — Plan of Care (Signed)
  Problem: Acute Rehab PT Goals(only PT should resolve) Goal: Pt Will Go Supine/Side To Sit Outcome: Progressing Flowsheets (Taken 11/07/2022 1234) Pt will go Supine/Side to Sit:  with minimal assist  with moderate assist Goal: Patient Will Transfer Sit To/From Stand Outcome: Progressing Flowsheets (Taken 11/07/2022 1234) Patient will transfer sit to/from stand:  with minimal assist  with moderate assist Goal: Pt Will Transfer Bed To Chair/Chair To Bed Outcome: Progressing Flowsheets (Taken 11/07/2022 1234) Pt will Transfer Bed to Chair/Chair to Bed:  with min assist  with mod assist Goal: Pt Will Ambulate Outcome: Progressing Flowsheets (Taken 11/07/2022 1234) Pt will Ambulate:  15 feet  with moderate assist  with rolling walker   12:34 PM, 11/07/22 Lonell Grandchild, MPT Physical Therapist with [facility] [phone] office [phone] mobile phone

## 2022-11-07 NOTE — Progress Notes (Signed)
Progress Note   Patient: Whitney Velez ERX:[id number] DOB: 1950/08/24 DOA: 11/06/2022     0 DOS: the patient was seen and examined on 11/07/2022   Brief hospital course: As per H&P written by Dr. Chauncy Passy on 11/06/2022 Whitney Velez  is a 72 y.o. female, with history of breast cancer, status postchemotherapy in 2018, hypertension, CKD stage III, dementia who lives with her daughter and son-in-law, was brought to hospital for worsening confusion.  Patient has dementia, and is confused at baseline, however patient's daughter noted that on Monday morning patient was more confused than usual, required 2 person assist to walk around.  She was dropped at adult daycare facility where she was unable to eat and was sent back as she was more confused.  Patient's daughter called her PCP who increased the dose of Zyprexa to 10 mg and increase the dose of Zoloft to 200 mg daily.  Patient's daughter noted that patient has been more agitated however she feels that her confusion has somewhat improved. There has been no history of fever, no cough.  No nausea vomiting or diarrhea. Patient denies abdominal pain or chest pain. Patient is oriented to self only In the ED CT head was negative for acute abnormality, UA was clear, chest x-ray showed possibility of atelectasis versus infiltrate. WBC is normal, patient is afebrile.  Patient fell 2 days ago and has a bruise on left elbow.  Assessment and Plan: 1-altered mental status/metabolic encephalopathy -Which appears to be further progression of underlying dementia. -Recent medication adjustment (especially Zoloft) could potentially played a role -On today's evaluation patient is a slightly more interactive but is still demonstrating son confusion and lack of physical participation as per family report. -No signs of acute infection appreciated with negative UA 8 and clear chest x-ray.  MRI demonstrating no acute intracranial abnormalities. -CT head was also negative for  acute abnormality. -After discussing with family members at bedside they would like to take patient home with adequate equipment to care for her Whitney Velez lift and hospital bed).  2-atelectasis -No frank pneumonia appreciated -No requiring oxygen supplementation, no fever, no chills, normal WBCs and no URI symptoms or coughing spells. -Continue holding on antibiotics currently -Family instructed to help patients with the use of incentive spirometer/flutter valve.  3-left elbow cellulitis -Continue treatment with Keflex  4-hypertension -Continue amlodipine -Continue as needed hydralazine -Heart healthy diet discussed with patient and family.  5-history of breast cancer -Continue Arimidex.  6-chronic kidney disease stage III a -Appears to be stable and at baseline -Continue to maintain adequate hydration -Continue minimizing nephrotoxic agents.  7-history of dementia with behavioral disturbances -Continue treatment with thiamine and Zyprexa -Continue holding Zoloft -Continue supportive care, consult reorientation and assistance.   Subjective:  Pleasantly confused, following simple commands, no participating much with physical engagement.  No chest pain, no shortness of breath, no nausea or vomiting.  Physical Exam: Vitals:   11/06/22 1923 11/07/22 0140 11/07/22 0538 11/07/22 1407  BP: (!) 145/81 (!) 158/93 (!) 182/92 (!) 164/99  Pulse: 61 76 68 66  Resp: '16 19 18   '$ Temp: 98.5 F (36.9 C) 98.5 F (36.9 C) 97.6 F (36.4 C) 99 F (37.2 C)  TempSrc:    Oral  SpO2: 99% 100% 98% 100%  Weight: 78.3 kg     Height:       General exam: Alert, awake, oriented x1; following simple commands.  No engaging in physical activity. Respiratory system: Good saturation on room air; no wheezing, no crackles,  no using accessory muscles. Cardiovascular system:RRR. No murmurs, rubs, gallops.  No JVD. Gastrointestinal system: Abdomen is nondistended, soft and nontender. No organomegaly or  masses felt. Normal bowel sounds heard. Central nervous system: Limited examination given underlying dementia; but no focal deficits appreciated. Extremities: No cyanosis or clubbing. Skin: No petechiae. Psychiatry: Judgement and insight appear impaired secondary to dementia.  Data Reviewed: MRI: Demonstrating no acute intracranial abnormalities. CBC: WBCs 8.7, hemoglobin 11.9 and platelet count 240 K Comprehensive metabolic panel: Sodium 161, potassium 3.4, chloride 105, bicarb 24, BUN 9, creatinine 0.85 and normal LFTs. B12: 363 TSH: 0.783.  Family Communication: Daughter at bedside.  Disposition: Status is: Observation The patient remains OBS appropriate and will d/c before 2 midnights.   Planned Discharge Destination: Home  Time spent: 35 minutes  Author: Barton Dubois, MD 11/07/2022 7:22 PM  For on call review [email or web address].

## 2022-11-07 NOTE — TOC Progression Note (Signed)
Transition of Care [facility]) - Progression Note    Patient Details  Name: Whitney Velez MRN: [id number] Date of Birth: 1950-10-05  Transition of Care [facility]) CM/SW Contact  Shade Flood, LCSW Phone Number: 11/07/2022, 2:42 PM  Clinical Narrative:     Per MD, plan now is for dc home with hospital bed and hoyer lift. Spoke with daughter who confirms plan. CMS provider options for DME reviewed. Referred to [facility] as requested. Erasmo Downer at [place] says they will deliver tonight or in the AM. Will plan for dc tomorrow once DME there.  Will follow up in AM.  Expected Discharge Plan: [place] Barriers to Discharge: Continued Medical Work up, Ship broker  Expected Discharge Plan and Services Expected Discharge Plan: [place] In-house Referral: Clinical Social Work   Post Acute Care Choice: Museum/gallery conservator Living arrangements for the past 2 months: [place]                 DME Arranged: Hospital bed, Other see comment [name] Lift) DME Agency: [facility] Date DME Agency Contacted: 11/07/22   Representative spoke with at DME Agency: Erasmo Downer             Social Determinants of Health ([place]) Interventions    Readmission Risk Interventions     No data to display

## 2022-11-07 NOTE — Progress Notes (Signed)
Transition of Care (TOC) -30 day Note       Patient Details  Name: Whitney Velez MRN: [id number] Date of Birth: 23-Aug-1950   Transition of [facility]) CM/SW Contact  Name: Shade Flood Phone Number: [phone] Date: 11/07/2022 Time: 1233    To Whom it May Concern:   Please be advised that the above patient will require a short-term nursing home stay, anticipated 30 days or less rehabilitation and strengthening. The plan is for return home. The above named patient has a primary diagnosis of dementia which supersedes any psychiatric diagnosis.

## 2022-11-07 NOTE — Evaluation (Signed)
Physical Therapy Evaluation Patient Details Name: Whitney Velez MRN: [id number] DOB: 08/02/1950 Today's Date: 11/07/2022  History of Present Illness  Whitney Velez  is a 72 y.o. female, with history of breast cancer, status postchemotherapy in 2018, hypertension, CKD stage III, dementia who lives with her daughter and son-in-law, was brought to hospital for worsening confusion.  Patient has dementia, and is confused at baseline, however patient's daughter noted that on Monday morning patient was more confused than usual, required 2 person assist to walk around.  She was dropped at adult daycare facility where she was unable to eat and was sent back as she was more confused.  Patient's daughter called her PCP who increased the dose of Zyprexa to 10 mg and increase the dose of Zoloft to 200 mg daily.  Patient's daughter noted that patient has been more agitated however she feels that her confusion has somewhat improved.  There has been no history of fever, no cough.  No nausea vomiting or diarrhea.  Patient denies abdominal pain or chest pain.  Patient is oriented to self only   Clinical Impression  Patient demonstrates slow labored movement for sitting up at bedside, frequent leaning/falling backwards once seated at EOB, required repeated verbal cues to flex knees and lean forward for maintaining sitting balance fair carryover,  when standing patient's feet slide forward requiring Mod/max assist to avoid loss of balance and limited to a few side steps before having to sit due to generalized weakness/poor standing balance.  Patient put back to bed after therapy to be cleaned by nursing staff.  Patient will benefit from continued skilled physical therapy in hospital and recommended venue below to increase strength, balance, endurance for safe ADLs and gait.         Recommendations for follow up therapy are one component of a multi-disciplinary discharge planning process, led by the attending physician.   Recommendations may be updated based on patient status, additional functional criteria and insurance authorization.  Follow Up Recommendations Skilled nursing-short term rehab (<3 hours/day) Can patient physically be transported by private vehicle: No    Assistance Recommended at Discharge Intermittent Supervision/Assistance  Patient can return home with the following  A lot of help with bathing/dressing/bathroom;A lot of help with walking and/or transfers;Help with stairs or ramp for entrance;Assistance with cooking/housework    Equipment Recommendations None recommended by PT  Recommendations for Other Services       Functional Status Assessment Patient has had a recent decline in their functional status and demonstrates the ability to make significant improvements in function in a reasonable and predictable amount of time.     Precautions / Restrictions Precautions Precautions: Fall Restrictions Weight Bearing Restrictions: No      Mobility  Bed Mobility Overal bed mobility: Needs Assistance Bed Mobility: Supine to Sit, Sit to Supine     Supine to sit: Mod assist Sit to supine: Mod assist   General bed mobility comments: increased time, labored movement, frequent verbal cueing for proper hand placement during supine to sitting with fair carryover    Transfers Overall transfer level: Needs assistance Equipment used: Rolling walker (2 wheels) Transfers: Sit to/from Stand, Bed to chair/wheelchair/BSC Sit to Stand: Mod assist, Max assist   Step pivot transfers: Mod assist, Max assist       General transfer comment: very unsteady with bilateral feet sliding foward    Ambulation/Gait Ambulation/Gait assistance: Max assist Gait Distance (Feet): 3 Feet Assistive device: Rolling walker (2 wheels) Gait Pattern/deviations: Decreased step length -  right, Decreased step length - left, Decreased stride length Gait velocity: slow     General Gait Details: limited to a  few slow labored steps at bedside with feet frequent sliding forward and near falls  Stairs            Wheelchair Mobility    Modified Rankin (Stroke Patients Only)       Balance Overall balance assessment: Needs assistance Sitting-balance support: Feet supported, No upper extremity supported Sitting balance-Leahy Scale: Fair Sitting balance - Comments: fair/poor seated at EOB   Standing balance support: During functional activity, Reliant on assistive device for balance, Bilateral upper extremity supported Standing balance-Leahy Scale: Poor Standing balance comment: using RW                             Pertinent Vitals/Pain Pain Assessment Pain Assessment: No/denies pain    Home Living Family/patient expects to be discharged to:: Private residence Living Arrangements: Children Available Help at Discharge: Family;Available 24 hours/day Type of Home: House Home Access: Level entry       Home Layout: One level Home Equipment: Conservation officer, nature (2 wheels);Wheelchair Probation officer (4 wheels);BSC/3in1      Prior Function Prior Level of Function : Needs assist       Physical Assist : Mobility (physical);ADLs (physical) Mobility (physical): Bed mobility;Transfers;Gait;Stairs ADLs (physical): Bathing Mobility Comments: Assisted short household distances using RW or Rollator ADLs Comments: assisted for showers by Adult daycare staff, assisted sponge bathing at home sitting in chair     Hand Dominance        Extremity/Trunk Assessment   Upper Extremity Assessment Upper Extremity Assessment: Generalized weakness    Lower Extremity Assessment Lower Extremity Assessment: Generalized weakness    Cervical / Trunk Assessment Cervical / Trunk Assessment: Normal  Communication   Communication: No difficulties  Cognition Arousal/Alertness: Awake/alert Behavior During Therapy: WFL for tasks assessed/performed Overall Cognitive Status: History of  cognitive impairments - at baseline                                          General Comments      Exercises     Assessment/Plan    PT Assessment Patient needs continued PT services  PT Problem List Decreased strength;Decreased range of motion;Decreased activity tolerance;Decreased balance;Decreased mobility;Decreased coordination       PT Treatment Interventions DME instruction;Gait training;Functional mobility training;Therapeutic activities;Therapeutic exercise;Balance training;Patient/family education    PT Goals (Current goals can be found in the Care Plan section)  Acute Rehab PT Goals Patient Stated Goal: return home PT Goal Formulation: With patient/family Time For Goal Achievement: 11/21/22 Potential to Achieve Goals: Good    Frequency Min 3X/week     Co-evaluation               AM-PAC PT "6 Clicks" Mobility  Outcome Measure Help needed turning from your back to your side while in a flat bed without using bedrails?: A Lot Help needed moving from lying on your back to sitting on the side of a flat bed without using bedrails?: A Lot Help needed moving to and from a bed to a chair (including a wheelchair)?: A Lot Help needed standing up from a chair using your arms (e.g., wheelchair or bedside chair)?: A Lot Help needed to walk in hospital room?: Total Help needed climbing 3-5 steps with  a railing? : Total 6 Click Score: 10    End of Session   Activity Tolerance: Patient tolerated treatment well;Patient limited by fatigue Patient left: in bed;with call bell/phone within reach Nurse Communication: Mobility status PT Visit Diagnosis: Unsteadiness on feet (R26.81);Other abnormalities of gait and mobility (R26.89);Muscle weakness (generalized) (M62.81)    Time: 2761-4709 PT Time Calculation (min) (ACUTE ONLY): 26 min   Charges:   PT Evaluation $PT Eval Moderate Complexity: 1 Mod PT Treatments $Therapeutic Activity: 23-37 mins         12:33 PM, 11/07/22 Lonell Grandchild, MPT Physical Therapist with [facility] [phone] office [phone] mobile phone

## 2022-11-07 NOTE — TOC Initial Note (Signed)
Transition of Care [facility]) - Initial/Assessment Note    Patient Details  Name: KIVA NORLAND MRN: [id number] Date of Birth: [birth year]  [facility]) CM/SW Contact:    Shade Flood, LCSW Phone Number: 11/07/2022, 12:24 PM  Clinical Narrative:                  Pt admitted from home. PT recommending SNF rehab at dc. TOC met with pt and her daughter and SIL at bedside to assess. Pt only A&O to self. Per daughter, pt lives with her and her husband. Pt normally attends adult day care four days a week. Apparently this week pt became more confused and has had difficulty walking and eating.   Reviewed PT recommendation with daughter who is agreeable. SNF provider options reviewed and will refer as requested.   Insurance Josem Kaufmann will be started. TOC will follow.  Expected Discharge Plan: Skilled Nursing Facility Barriers to Discharge: Continued Medical Work up, Ship broker   Patient Goals and CMS Choice Patient states their goals for this hospitalization and ongoing recovery are:: rehab CMS Medicare.gov Compare Post Acute Care list provided to:: Patient Represenative (must comment) Choice offered to / list presented to : Adult Children  Expected Discharge Plan and Services Expected Discharge Plan: [place] In-house Referral: Clinical Social Work   Post Acute Care Choice: [place] Living arrangements for the past 2 months: [place]                                      Prior Living Arrangements/Services Living arrangements for the past 2 months: Single Family Home Lives with:: Adult Children Patient language and need for interpreter reviewed:: Yes Do you feel safe going back to the place where you live?: Yes      Need for Family Participation in Patient Care: Yes (Comment) Care giver support system in place?: Yes (comment)   Criminal Activity/Legal Involvement Pertinent to Current Situation/Hospitalization: No  - Comment as needed  Activities of Daily Living Home Assistive Devices/Equipment: Bedside commode/3-in-1, Walker (specify type), Wheelchair ADL Screening (condition at time of admission) Patient's cognitive ability adequate to safely complete daily activities?: No Is the patient deaf or have difficulty hearing?: No Does the patient have difficulty seeing, even when wearing glasses/contacts?: No Does the patient have difficulty concentrating, remembering, or making decisions?: Yes Patient able to express need for assistance with ADLs?: No Does the patient have difficulty dressing or bathing?: Yes Independently performs ADLs?: No Communication: Needs assistance Walks in Home: Needs assistance Does the patient have difficulty walking or climbing stairs?: Yes Weakness of Legs: Both Weakness of Arms/Hands: None  Permission Sought/Granted Permission sought to share information with : Facility Art therapist granted to share information with : Yes, Verbal Permission Granted     Permission granted to share info w AGENCY: sNFs        Emotional Assessment Appearance:: Appears stated age Attitude/Demeanor/Rapport: Engaged Affect (typically observed): Pleasant Orientation: : Oriented to Self Alcohol / Substance Use: Not Applicable Psych Involvement: No (comment)  Admission diagnosis:  Acute delirium [R41.0] Altered mental status [R41.82] Patient Active Problem List   Diagnosis Date Noted   Altered mental status 11/06/2022   CKD (chronic kidney disease) stage 3, GFR 30-59 ml/min ([place]) 01/03/2022   Urinary incontinence 07/11/2021   Falls frequently 07/11/2021   Dementia ([place]) 04/24/2021   Osteopenia 05/08/2019   S/P bilateral  mastectomy 12/01/2018   Invasive ductal carcinoma of breast, female, right ([place])    Ductal carcinoma in situ (DCIS) of left breast    Goals of care, counseling/discussion 05/29/2018   Allergic rhinitis 02/20/2015   Essential hypertension,  benign 12/16/2013   PCP:  Coral Spikes, DO Pharmacy:   [place], [place] [address] Phone: [phone] Fax: [place], [place] [address] Phone: [phone] Fax: [phone]     Social Determinants of Health (SDOH) Interventions    Readmission Risk Interventions     No data to display

## 2022-11-07 NOTE — Progress Notes (Signed)
Patient requires frequent re-positioning of the body in ways that cannot be achieved with an ordinary bed or wedge pillow, to eliminate pain, reduce pressure, and the head of the bed to be elevated more than 30 degrees most of the time due to dementia.

## 2022-11-07 NOTE — NC FL2 (Signed)
[place] MEDICAID FL2 LEVEL OF CARE SCREENING TOOL     IDENTIFICATION  Patient Name: Whitney Velez Birthdate: August 27, 1950 Sex: female Admission Date (Current Location): 11/06/2022  [facility] and [place] Number:  [employer] and Address:  [place] [address] [place]      Provider Number: [id number]  Attending Physician Name and Address:  Barton Dubois, MD  Relative Name and Phone Number:       Current Level of Care: Hospital Recommended Level of Care: [place] Prior Approval Number:    Date Approved/Denied:   PASRR Number:    Discharge Plan: SNF    Current Diagnoses: Patient Active Problem List   Diagnosis Date Noted   Altered mental status 11/06/2022   CKD (chronic kidney disease) stage 3, GFR 30-59 ml/min ([facility]) 01/03/2022   Urinary incontinence 07/11/2021   Falls frequently 07/11/2021   Dementia ([place]) 04/24/2021   Osteopenia 05/08/2019   S/P bilateral mastectomy 12/01/2018   Invasive ductal carcinoma of breast, female, right ([place])    Ductal carcinoma in situ (DCIS) of left breast    Goals of care, counseling/discussion 05/29/2018   Allergic rhinitis 02/20/2015   Essential hypertension, benign 12/16/2013    Orientation RESPIRATION BLADDER Height & Weight     Self  Normal Incontinent Weight: 172 lb 9.9 oz (78.3 kg) Height:  '4\' 11"'$  (149.9 cm)  BEHAVIORAL SYMPTOMS/MOOD NEUROLOGICAL BOWEL NUTRITION STATUS      Incontinent Diet (see dc summary)  AMBULATORY STATUS COMMUNICATION OF NEEDS Skin   Extensive Assist Verbally Normal                       Personal Care Assistance Level of Assistance  Bathing, Feeding, Dressing Bathing Assistance: Limited assistance Feeding assistance: Limited assistance Dressing Assistance: Limited assistance     Functional Limitations Info  Sight, Hearing, Speech Sight Info: Adequate Hearing Info: Adequate Speech Info: Adequate    SPECIAL CARE FACTORS FREQUENCY   PT (By licensed PT), OT (By licensed OT)     PT Frequency: 5x week OT Frequency: 3x week            Contractures Contractures Info: Not present    Additional Factors Info  Code Status, Allergies, Psychotropic Code Status Info: DNR Allergies Info: Penicillins, Percocet, Ranitidine Psychotropic Info: Zoloft, Zyprexa         Current Medications (11/07/2022):  This is the current hospital active medication list Current Facility-Administered Medications  Medication Dose Route Frequency Provider Last Rate Last Admin   amLODipine (NORVASC) tablet 5 mg  5 mg Oral Daily Oswald Hillock, MD   5 mg at 11/07/22 0818   anastrozole (ARIMIDEX) tablet 1 mg  1 mg Oral Daily Oswald Hillock, MD   1 mg at 11/07/22 1058   aspirin chewable tablet 81 mg  81 mg Oral Daily Oswald Hillock, MD   81 mg at 11/07/22 0818   cephALEXin (KEFLEX) capsule 500 mg  500 mg Oral Q12H Oswald Hillock, MD   500 mg at 11/07/22 0818   enoxaparin (LOVENOX) injection 40 mg  40 mg Subcutaneous Q24H Oswald Hillock, MD   40 mg at 11/06/22 2114   hydrALAZINE (APRESOLINE) tablet 25 mg  25 mg Oral Q6H PRN Oswald Hillock, MD       loratadine (CLARITIN) tablet 10 mg  10 mg Oral Daily Oswald Hillock, MD   10 mg at 11/07/22 0819   OLANZapine (ZYPREXA) tablet  10 mg  10 mg Oral QHS Oswald Hillock, MD   10 mg at 11/06/22 2114   ondansetron (ZOFRAN) tablet 4 mg  4 mg Oral Q6H PRN Oswald Hillock, MD       Or   ondansetron [facility]) injection 4 mg  4 mg Intravenous Q6H PRN Oswald Hillock, MD       thiamine (VITAMIN B1) tablet 100 mg  100 mg Oral Daily Oswald Hillock, MD   100 mg at 11/07/22 0818     Discharge Medications: Please see discharge summary for a list of discharge medications.  Relevant Imaging Results:  Relevant Lab Results:   Additional Information SSN: [id number], LCSW

## 2022-11-07 NOTE — Care Management Obs Status (Signed)
[place] NOTIFICATION   Patient Details  Name: Whitney Velez MRN: [id number] Date of Birth: [birth year]   Medicare Observation Status Notification Given:  Yes    Tommy Medal 11/07/2022, 4:01 PM

## 2022-11-08 DIAGNOSIS — M6281 Muscle weakness (generalized): Secondary | ICD-10-CM | POA: Diagnosis not present

## 2022-11-08 DIAGNOSIS — R5381 Other malaise: Secondary | ICD-10-CM

## 2022-11-08 DIAGNOSIS — N1831 Chronic kidney disease, stage 3a: Secondary | ICD-10-CM | POA: Diagnosis not present

## 2022-11-08 DIAGNOSIS — L03114 Cellulitis of left upper limb: Secondary | ICD-10-CM | POA: Diagnosis not present

## 2022-11-08 DIAGNOSIS — R4182 Altered mental status, unspecified: Secondary | ICD-10-CM | POA: Diagnosis not present

## 2022-11-08 DIAGNOSIS — N183 Chronic kidney disease, stage 3 unspecified: Secondary | ICD-10-CM | POA: Diagnosis not present

## 2022-11-08 DIAGNOSIS — F03918 Unspecified dementia, unspecified severity, with other behavioral disturbance: Secondary | ICD-10-CM | POA: Diagnosis not present

## 2022-11-08 DIAGNOSIS — I10 Essential (primary) hypertension: Secondary | ICD-10-CM | POA: Diagnosis not present

## 2022-11-08 MED ORDER — AMLODIPINE BESYLATE 5 MG PO TABS: 5.0000 mg | ORAL_TABLET | Freq: Every day | ORAL | 2 refills | Status: DC

## 2022-11-08 MED ORDER — VITAMIN B-1 100 MG PO TABS: 100.0000 mg | ORAL_TABLET | Freq: Every day | ORAL | 2 refills | Status: DC

## 2022-11-08 MED ORDER — VITAMIN B-12 1000 MCG PO TABS: 1000.0000 ug | ORAL_TABLET | Freq: Every day | ORAL | 3 refills | Status: DC

## 2022-11-08 MED ORDER — SERTRALINE HCL 100 MG PO TABS: 100.0000 mg | ORAL_TABLET | Freq: Every day | ORAL | Status: DC

## 2022-11-08 MED ORDER — CEPHALEXIN 500 MG PO CAPS: 500.0000 mg | ORAL_CAPSULE | Freq: Two times a day (BID) | ORAL | 0 refills | Status: AC

## 2022-11-08 NOTE — Progress Notes (Signed)
No acute events overnight. Pt remained pleasantly confused. Whitney Corona [name]

## 2022-11-08 NOTE — TOC Transition Note (Signed)
Transition of Care [facility]) - CM/SW Discharge Note   Patient Details  Name: Whitney Velez MRN: [id number] Date of Birth: 02-24-1950  Transition of Care [facility]) CM/SW Contact:  Shade Flood, LCSW Phone Number: 11/08/2022, 3:33 PM   Clinical Narrative:     Pt stable for dc home today per MD. Damaris Schooner with pt's daughter throughout the day to coordinate dc. At this time, hospital bed and [facility] lift have been delivered. Daughter states that she and her husband would like to transport pt home on their own.  TOC arranged HH with [facility] at dtr request.   There are no other TOC needs for dc.  Final next level of care: [place] Barriers to Discharge: Barriers Resolved   Patient Goals and CMS Choice Patient states their goals for this hospitalization and ongoing recovery are:: go home CMS Medicare.gov Compare Post Acute Care list provided to:: Patient Represenative (must comment) Choice offered to / list presented to : Adult Children  Discharge Placement                       Discharge Plan and Services In-house Referral: Clinical Social Work   Post Acute Care Choice: Durable Medical Equipment          DME Arranged: Hospital bed, Other see comment [name] Lift) DME Agency: [employer] Date DME Agency Contacted: 11/07/22   Representative spoke with at DME Agency: [place]: PT [place]: [place] Date [place]: 11/08/22   Representative spoke with at [place]: [place] ([place]) Interventions     Readmission Risk Interventions     No data to display

## 2022-11-08 NOTE — Discharge Summary (Signed)
Physician Discharge Summary   Patient: Whitney Velez MRN: [id number] DOB: 1950/01/15  Admit date:     11/06/2022  Discharge date: 11/08/22  Discharge Physician: Barton Dubois   PCP: Coral Spikes, DO   Recommendations at discharge:  Repeat basic metabolic panel to follow electrolytes renal function Reassess complete resolution of left upper extremity cellulitis process after completion of oral antibiotics. Reassess blood pressure and for adjust antihypertensive regimen.  Discharge Diagnoses: Principal Problem:   Altered mental status Active Problems:   Primary hypertension   Dementia with behavioral disturbance (HCC)   Cellulitis of left arm   Physical deconditioning Class I obesity Atelectasis  Hospital Course: As per H&P written by Dr. Chauncy Passy on 11/06/2022 [name]  is a 72 y.o. female, with history of breast cancer, status postchemotherapy in 2018, hypertension, CKD stage III, dementia who lives with her daughter and son-in-law, was brought to hospital for worsening confusion.  Patient has dementia, and is confused at baseline, however patient's daughter noted that on Monday morning patient was more confused than usual, required 2 person assist to walk around.  She was dropped at adult daycare facility where she was unable to eat and was sent back as she was more confused.  Patient's daughter called her PCP who increased the dose of Zyprexa to 10 mg and increase the dose of Zoloft to 200 mg daily.  Patient's daughter noted that patient has been more agitated however she feels that her confusion has somewhat improved. There has been no history of fever, no cough.  No nausea vomiting or diarrhea. Patient denies abdominal pain or chest pain. Patient is oriented to self only In the ED CT head was negative for acute abnormality, UA was clear, chest x-ray showed possibility of atelectasis versus infiltrate. WBC is normal, patient is afebrile.   Patient fell 2 days ago and has a  bruise on left elbow.  Assessment and Plan: 1-altered mental status/metabolic encephalopathy -Which appears to be further progression of underlying dementia. -Recent medication adjustment (especially Zoloft) could potentially played a role -On today's evaluation patient was more interactive and participating with interrogation. Still demonstrating some confusion and lack of physical participation as per family report. -No signs of acute infection appreciated with negative UA 8 and clear chest x-ray.  MRI demonstrating no acute intracranial abnormalities. -CT head was also negative for acute abnormality. -After discussing with family members at bedside they would like to take patient home with adequate equipment to care for her [name] lift and hospital bed); DME's ordered.   2-atelectasis -No frank pneumonia appreciated -No requiring oxygen supplementation, no fever, no chills, normal WBCs and no URI symptoms or coughing spells. -Continue holding on antibiotics currently -Family instructed to help patients with the use of incentive spirometer/flutter valve.   3-left elbow cellulitis -Continue treatment with Keflex -3 more days of antibiotics pending at time of discharge. -Local care discussed with family members.   4-hypertension -Continue amlodipine -Heart healthy diet discussed with patient and family. -Reassess blood pressure at follow-up visit and readjust antihypertensive regimen as needed.   5-history of breast cancer -Continue Arimidex.   6-chronic kidney disease stage III a -Appears to be stable and at baseline -Continue to maintain adequate hydration -Continue minimizing nephrotoxic agents. -Repeat basic metabolic panel on follow-up visit to assess electrolytes and renal function trend/stability.   7-history of dementia with behavioral disturbances -Continue treatment with thiamine and Zyprexa -Okay to resume low-dose of Zoloft on 11/10/2022. -Continue supportive care,  constant reorientation and  assistance. -DME's and home health services arranged at discharge to facilitate care.   Consultants: None Procedures performed: See below for x-ray reports. Disposition: Home with home health services. Diet recommendation: Heart healthy diet.  DISCHARGE MEDICATION: Allergies as of 11/08/2022       Reactions   Penicillins Other (See Comments)   Nausea, vomiting, dizziness Has patient had a PCN reaction causing immediate rash, facial/tongue/throat swelling, SOB or lightheadedness with hypotension: no Has patient had a PCN reaction causing severe rash involving mucus membranes or skin necrosis: no Has patient had a PCN reaction that required hospitalization: no Has patient had a PCN reaction occurring within the last 10 years: no If all of the above answers are "NO", then may proceed with Cephalosporin use.   Percocet [oxycodone-acetaminophen] Diarrhea, Nausea And Vomiting   Ranitidine Itching        Medication List     STOP taking these medications    ibuprofen 200 MG tablet Commonly known as: ADVIL       TAKE these medications    albuterol 108 (90 Base) MCG/ACT inhaler Commonly known as: VENTOLIN HFA INHALE TWO PUFFS INTO THE LUNGS EVERY SIX HOURS AS NEEDED FOR WHEEZING   amLODipine 5 MG tablet Commonly known as: NORVASC Take 1 tablet (5 mg total) by mouth daily. Start taking on: November 09, 2022   anastrozole 1 MG tablet Commonly known as: ARIMIDEX TAKE ONE (1) TABLET BY MOUTH EVERY DAY   aspirin 81 MG tablet Take 81 mg by mouth daily.   CALCIUM + VITAMIN D3 PO Take 1 tablet by mouth 2 (two) times daily.   cephALEXin 500 MG capsule Commonly known as: KEFLEX Take 1 capsule (500 mg total) by mouth every 12 (twelve) hours for 3 days.   cetirizine 10 MG tablet Commonly known as: ZYRTEC Take 10 mg by mouth daily.   cyanocobalamin 1000 MCG tablet Commonly known as: VITAMIN B12 Take 1 tablet (1,000 mcg total) by mouth daily.    denosumab 60 MG/ML Sosy injection Commonly known as: PROLIA Inject 60 mg into the skin every 6 (six) months.   montelukast 10 MG tablet Commonly known as: SINGULAIR TAKE ONE TABLET ('10MG'$  TOTAL) BY MOUTH ATBEDTIME   OLANZapine 10 MG tablet Commonly known as: ZYPREXA Take 1 tablet (10 mg total) by mouth at bedtime.   potassium chloride 10 MEQ tablet Commonly known as: KLOR-CON TAKE TWO (2) TABLETS BY MOUTH DAILY   sertraline 100 MG tablet Commonly known as: ZOLOFT Take 1 tablet (100 mg total) by mouth at bedtime. Start taking on: November 10, 2022 What changed:  how much to take when to take this These instructions start on November 10, 2022. If you are unsure what to do until then, ask your doctor or other care provider.   thiamine 100 MG tablet Commonly known as: Vitamin B-1 Take 1 tablet (100 mg total) by mouth daily. Start taking on: November 09, 2022               Durable Medical Equipment  (From admission, onward)           Start     Ordered   11/07/22 1441  For home use only DME Other see comment  Once       Comments: [name] Lift  Question:  Length of Need  Answer:  Lifetime   11/07/22 1440   11/07/22 1412  For home use only DME Hospital bed  Once       Question Answer Comment  Length of Need Lifetime   The above medical condition requires: Patient requires the ability to reposition frequently   Head must be elevated greater than: 30 degrees   Bed type Semi-electric   Hoyer Lift Yes      11/07/22 1412            Follow-up Information     [facility].   Specialty: [place] Why: [facility] staff will call [name] to schedule in home visits. Contact information: [address] [place] [address] [phone]         Coral Spikes, DO. Schedule an appointment as soon as possible for a visit in 10 day(s).   Specialty: Family Medicine Contact information: [place] [address] [phone]                Discharge Exam: Danley Danker Weights   11/06/22 1228 11/06/22 1923  Weight: 76.2 kg 78.3 kg   General exam: Afebrile, in no acute distress.  Oriented x1.  Able to follow simple commands. Respiratory system: Clear to auscultation. Respiratory effort normal.  Good saturation on room air. Cardiovascular system:RRR. No rubs or gallops. Gastrointestinal system: Abdomen is nondistended, soft and nontender. No organomegaly or masses felt. Normal bowel sounds heard. Central nervous system: No focal neurological deficits. Extremities: No cyanosis, clubbing or edema. Skin: No left upper extremity with mild redness and abrasion after recent fall suggesting nonpurulent cellulitic process. Psychiatry: Judgement and insight appear impaired secondary to underlying dementia.   Condition at discharge: stable  The results of significant diagnostics from this hospitalization (including imaging, microbiology, ancillary and laboratory) are listed below for reference.   Imaging Studies: MR BRAIN WO CONTRAST  Result Date: 11/06/2022 CLINICAL DATA:  Mental status change EXAM: MRI HEAD WITHOUT CONTRAST TECHNIQUE: Multiplanar, multiecho pulse sequences of the brain and surrounding structures were obtained without intravenous contrast. COMPARISON:  CT 11/06/2022 FINDINGS: Brain: Moderate ventricular dilatation involving third fourth and lateral ventricles as noted on CT today. Findings most compatible with communicating hydrocephalus. Extensive hyperintensity throughout the cerebral white matter bilaterally. Patchy hyperintensity in the thalamus. Negative for acute infarct or mass Image quality degraded by motion. Motion became severe proximally halfway through the examination. Several sequences are nondiagnostic including gradient echo imaging. Vascular: Normal arterial flow voids Skull and upper cervical spine: No focal skeletal abnormality Sinuses/Orbits:  Paranasal sinuses clear.  Negative orbit Other: None IMPRESSION: 1. Image quality degraded by motion. 2. 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 3. Extensive T2 and FLAIR hyperintensity in the white matter and thalamus bilaterally. Some of this may be due to chronic ischemia. Some may also be due to transependymal resorption of CSF. 4. No acute infarct. Electronically Signed   By: Franchot Gallo M.D.   On: 11/06/2022 19:29   DG Chest Portable 1 View  Result Date: 11/06/2022 CLINICAL DATA:  Altered mental status EXAM: PORTABLE CHEST 1 VIEW COMPARISON:  Chest radiograph dated 06/06/2022 FINDINGS: Lines/tubes: Left chest wall port tip projects over the SVC. Chest: Low lung volumes.  Bibasilar patchy opacities. Pleura: No pneumothorax or pleural effusion. Heart/mediastinum: Enlarged cardiomediastinal silhouette is likely projectional. Aortic atherosclerosis. Bones: Surgical clips project over the bilateral axillary regions. IMPRESSION: 1. Low lung volumes with bibasilar patchy opacities, likely atelectasis. Aspiration or pneumonia can be considered in the appropriate clinical setting. 2.  Aortic Atherosclerosis (ICD10-I70.0). Electronically Signed   By: Darrin Nipper M.D.   On: 11/06/2022 14:09  CT Head Wo Contrast  Result Date: 11/06/2022 CLINICAL DATA:  Altered mental status, dementia EXAM: CT HEAD WITHOUT CONTRAST TECHNIQUE: Contiguous axial images were obtained from the base of the skull through the vertex without intravenous contrast. RADIATION DOSE REDUCTION: This exam was performed according to the departmental dose-optimization program which includes automated exposure control, adjustment of the mA and/or kV according to patient size and/or use of iterative reconstruction technique. COMPARISON:  CT head 07/08/2021 FINDINGS: Brain: There is no acute intracranial hemorrhage, extra-axial fluid collection, or acute infarct. There is dilation of the ventricular system  which is out of proportion to the degree of sulcal widening, similar to the study from 2022. There is confluent hypodensity throughout the supratentorial white matter which is nonspecific but may reflect sequela of advanced chronic small vessel ischemic change and superimposed posttreatment change, also stable. An expanded and partially empty sella is unchanged there is no mass lesion. There is no mass effect or midline shift. Vascular: No hyperdense vessel or unexpected calcification. Skull: There is calcification of the bilateral carotid siphons. Sinuses/Orbits: The imaged paranasal sinuses are clear. The imaged globes and orbits are unremarkable. Other: None. IMPRESSION: 1. No acute intracranial pathology. 2. Unchanged parenchymal volume loss and white matter hypodensity likely reflecting a combination of chronic small-vessel ischemic change and posttreatment change. Electronically Signed   By: Valetta Mole M.D.   On: 11/06/2022 13:32    Microbiology: Results for orders placed or performed during the hospital encounter of 07/08/21  Resp Panel by RT-PCR (Flu A&B, Covid) Nasopharyngeal Swab     Status: None   Collection Time: 07/08/21  2:56 PM   Specimen: Nasopharyngeal Swab; Nasopharyngeal(NP) swabs in vial transport medium  Result Value Ref Range Status   SARS Coronavirus 2 by RT PCR NEGATIVE NEGATIVE Final    Comment: (NOTE) SARS-CoV-2 target nucleic acids are NOT DETECTED.  The SARS-CoV-2 RNA is generally detectable in upper respiratory specimens during the acute phase of infection. The lowest concentration of SARS-CoV-2 viral copies this assay can detect is 138 copies/mL. A negative result does not preclude SARS-Cov-2 infection and should not be used as the sole basis for treatment or other patient management decisions. A negative result may occur with  improper specimen collection/handling, submission of specimen other than nasopharyngeal swab, presence of viral mutation(s) within  the areas targeted by this assay, and inadequate number of viral copies(<138 copies/mL). A negative result must be combined with clinical observations, patient history, and epidemiological information. The expected result is Negative.  Fact Sheet for Patients:  EntrepreneurPulse.com.au  Fact Sheet for Healthcare Providers:  IncredibleEmployment.be  This test is no t yet approved or cleared by the [place] FDA and  has been authorized for detection and/or diagnosis of SARS-CoV-2 by FDA under an Emergency Use Authorization (EUA). This EUA will remain  in effect (meaning this test can be used) for the duration of the COVID-19 declaration under Section 564(b)(1) of the Act, 21 U.S.C.section 360bbb-3(b)(1), unless the authorization is terminated  or revoked sooner.       Influenza A by PCR NEGATIVE NEGATIVE Final   Influenza B by PCR NEGATIVE NEGATIVE Final    Comment: (NOTE) The Xpert Xpress SARS-CoV-2/FLU/RSV plus assay is intended as an aid in the diagnosis of influenza from Nasopharyngeal swab specimens and should not be used as a sole basis for treatment. Nasal washings and aspirates are unacceptable for Xpert Xpress SARS-CoV-2/FLU/RSV testing.  Fact Sheet for Patients: EntrepreneurPulse.com.au  Fact Sheet for Healthcare Providers: IncredibleEmployment.be  This  test is not yet approved or cleared by the [place] and has been authorized for detection and/or diagnosis of SARS-CoV-2 by FDA under an Emergency Use Authorization (EUA). This EUA will remain in effect (meaning this test can be used) for the duration of the COVID-19 declaration under Section 564(b)(1) of the Act, 21 U.S.C. section 360bbb-3(b)(1), unless the authorization is terminated or revoked.  Performed at [facility], [address], [place], [place] [address]     Labs: CBC: Recent Labs  Lab 11/06/22 1232  11/07/22 0550  WBC 9.8 8.7  HGB 11.7* 11.9*  HCT 36.3 37.3  MCV 88.8 89.7  PLT 244 001   Basic Metabolic Panel: Recent Labs  Lab 11/06/22 1232 11/07/22 0550  NA 135 137  K 3.9 3.4*  CL 103 105  CO2 25 24  GLUCOSE 82 107*  BUN 13 9  CREATININE 0.98 0.85  CALCIUM 8.7* 8.4*   Liver Function Tests: Recent Labs  Lab 11/06/22 1232 11/07/22 0550  AST 39 42*  ALT 22 23  ALKPHOS 81 79  BILITOT 0.6 0.5  PROT 8.0 8.0  ALBUMIN 3.6 3.7   CBG: Recent Labs  Lab 11/06/22 1231  GLUCAP 101*    Discharge time spent: greater than 30 minutes.  Signed: Barton Dubois, MD [facility] 11/08/2022

## 2022-11-09 ENCOUNTER — Telehealth: Payer: Self-pay | Admitting: *Deleted

## 2022-11-09 NOTE — Telephone Encounter (Signed)
Transition Care Management Follow-up Telephone Call Date of discharge and from where: 11/08/22- [facility]- disorientation How have you been since you were released from the hospital? Daughter states she is doing ok ut has had a significant decline in her mental status since Any questions or concerns? No  Items Reviewed: Did the pt receive and understand the discharge instructions provided? Yes - daughter Medications obtained and verified? Yes  Other? No  Any new allergies since your discharge? No  Dietary orders reviewed? Yes Do you have support at home? Yes  daughter takes care of patient  Home Care and Equipment/Supplies: Were home health services ordered? yes If so, what is the name of the agency? [facility]  Has the agency set up a time to come to the patient's home? yes Were any new equipment or medical supplies ordered?  No What is the name of the medical supply agency? N/A Were you able to get the supplies/equipment? not applicable Do you have any questions related to the use of the equipment or supplies? No  Functional Questionnaire: (I = Independent and D = Dependent) ADLs: D  Bathing/Dressing- D  Meal Prep- D  Eating- D  Maintaining continence- D  Transferring/Ambulation- D  Managing Meds- D  Follow up appointments reviewed:  PCP Hospital f/u appt confirmed? Patient unable to come in currently due to condition Specialist Hospital f/u appt confirmed? No   Are transportation arrangements needed? No  If their condition worsens, is the pt aware to call PCP or go to the Emergency Dept.? Yes Was the patient provided with contact information for the PCP's office or ED? Yes Was to pt encouraged to call back with questions or concerns? Yes

## 2022-11-12 ENCOUNTER — Other Ambulatory Visit: Payer: Self-pay | Admitting: Family Medicine

## 2022-11-12 ENCOUNTER — Encounter: Payer: Self-pay | Admitting: Family Medicine

## 2022-11-12 DIAGNOSIS — G910 Communicating hydrocephalus: Secondary | ICD-10-CM

## 2022-11-12 DIAGNOSIS — R5381 Other malaise: Secondary | ICD-10-CM

## 2022-11-12 DIAGNOSIS — F03918 Unspecified dementia, unspecified severity, with other behavioral disturbance: Secondary | ICD-10-CM

## 2022-11-13 NOTE — Telephone Encounter (Signed)
Coral Spikes, DO     I put the referral in. Please make sure this is done ASAP. Thank you  Dr. Lacinda Axon

## 2022-11-14 LAB — VITAMIN B1: Vitamin B1 (Thiamine): 156.4 nmol/L (ref 66.5–200.0)

## 2023-02-13 ENCOUNTER — Encounter: Payer: Self-pay | Admitting: Family Medicine

## 2023-02-15 ENCOUNTER — Telehealth: Payer: Self-pay

## 2023-02-15 NOTE — Telephone Encounter (Signed)
[employer] forms was dropped of to be completed placed in Dr Lacinda Axon folder call when ready to be picked up    [facility] -[phone]

## 2023-02-19 ENCOUNTER — Encounter: Payer: Self-pay | Admitting: Family Medicine

## 2023-03-20 ENCOUNTER — Other Ambulatory Visit: Payer: Medicare Other

## 2023-03-27 ENCOUNTER — Ambulatory Visit: Payer: Medicare Other

## 2023-03-27 ENCOUNTER — Ambulatory Visit: Payer: Medicare Other | Admitting: Hematology

## 2023-04-11 ENCOUNTER — Ambulatory Visit: Payer: Medicare Other | Admitting: Family Medicine

## 2023-04-17 ENCOUNTER — Encounter (HOSPITAL_COMMUNITY): Payer: Self-pay | Admitting: Hematology

## 2023-04-24 ENCOUNTER — Ambulatory Visit: Payer: Medicare Other | Admitting: Physician Assistant

## 2023-04-24 ENCOUNTER — Encounter: Payer: Self-pay | Admitting: Physician Assistant

## 2023-05-15 DIAGNOSIS — R0902 Hypoxemia: Secondary | ICD-10-CM | POA: Diagnosis not present

## 2023-05-15 DIAGNOSIS — R52 Pain, unspecified: Secondary | ICD-10-CM | POA: Diagnosis not present

## 2023-05-15 DIAGNOSIS — Z743 Need for continuous supervision: Secondary | ICD-10-CM | POA: Diagnosis not present

## 2023-05-15 DIAGNOSIS — R279 Unspecified lack of coordination: Secondary | ICD-10-CM | POA: Diagnosis not present

## 2023-05-24 DIAGNOSIS — R279 Unspecified lack of coordination: Secondary | ICD-10-CM | POA: Diagnosis not present

## 2023-05-24 DIAGNOSIS — Z743 Need for continuous supervision: Secondary | ICD-10-CM | POA: Diagnosis not present

## 2023-07-03 ENCOUNTER — Encounter: Payer: Self-pay | Admitting: Family Medicine

## 2023-08-01 DEATH — deceased

## 2024-05-24 IMAGING — DX DG CHEST 2V
2 series · 2 of 2 positions shown · non-contrast
Comparison: Chest x-ray 07/08/2021.

CLINICAL DATA: Shortness of breath.

EXAM:
CHEST - 2 VIEW

[chest pa]
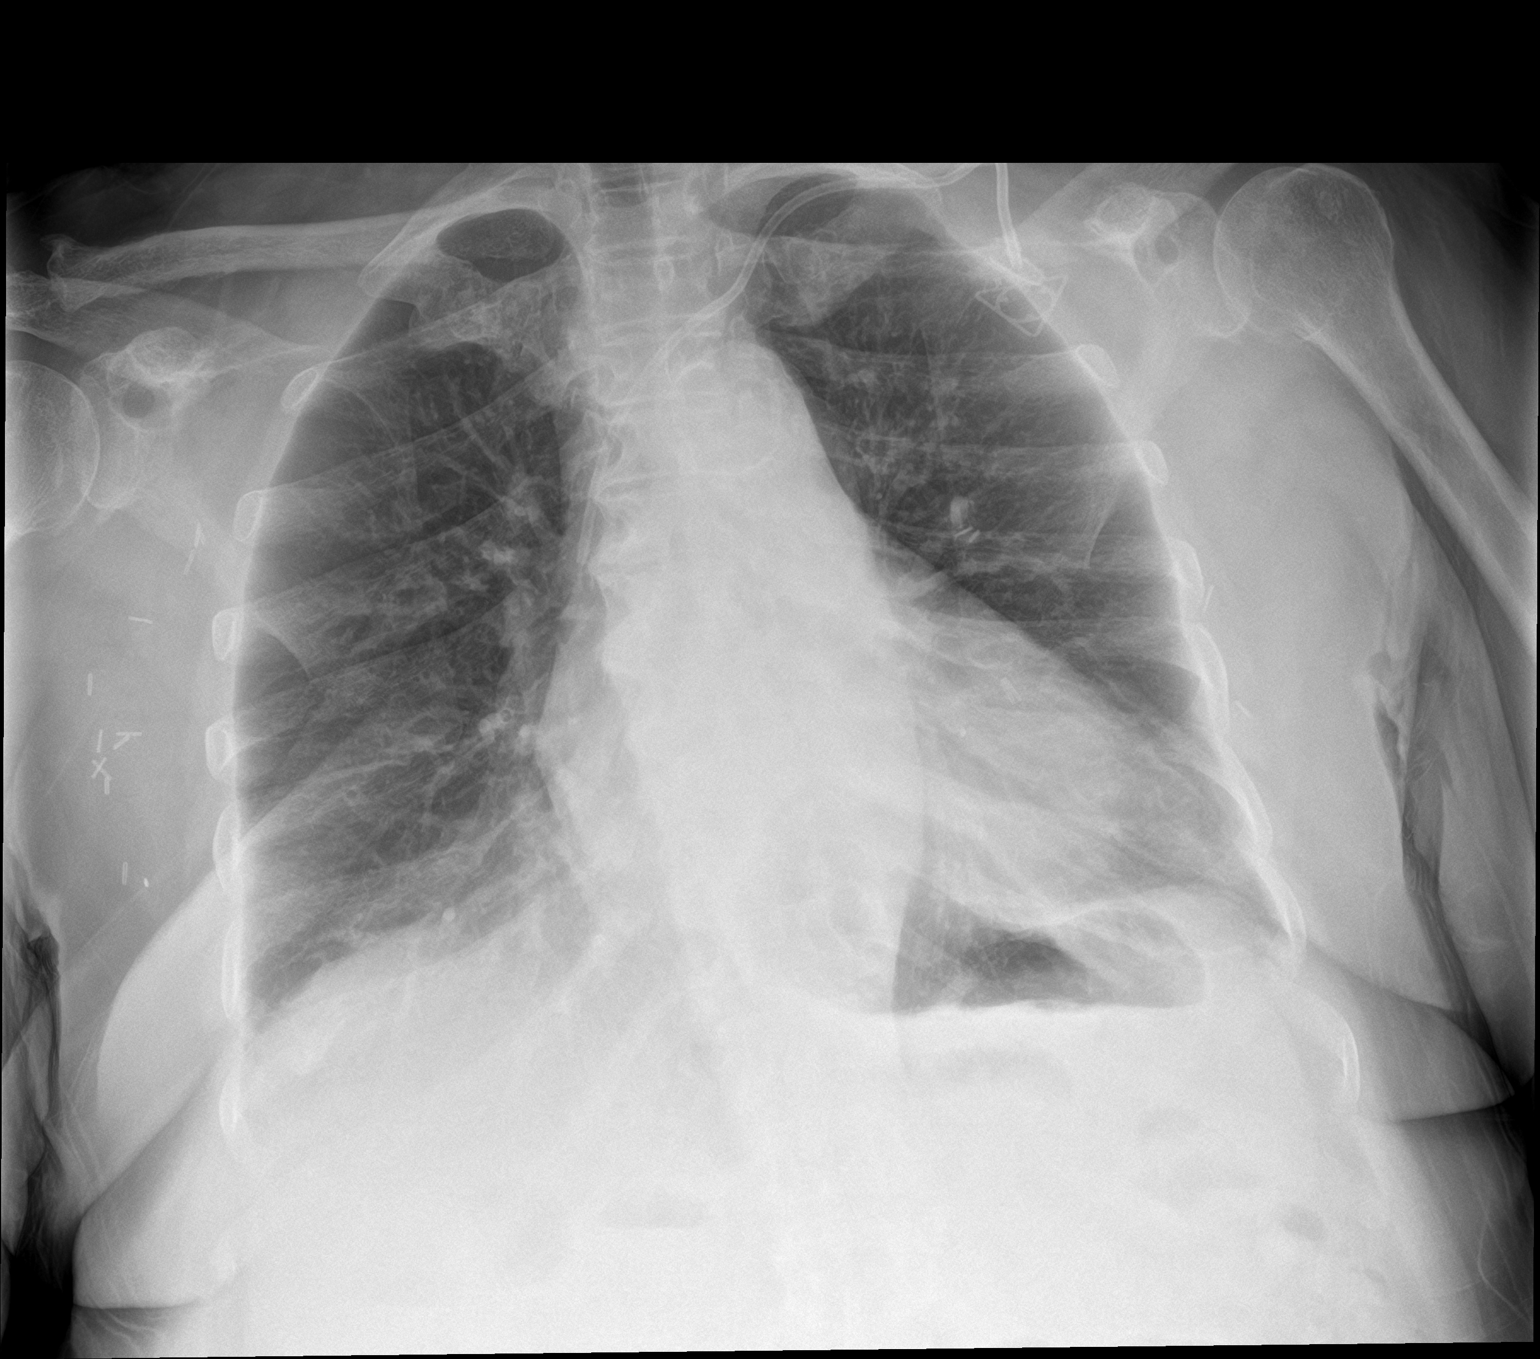

[chest lat]
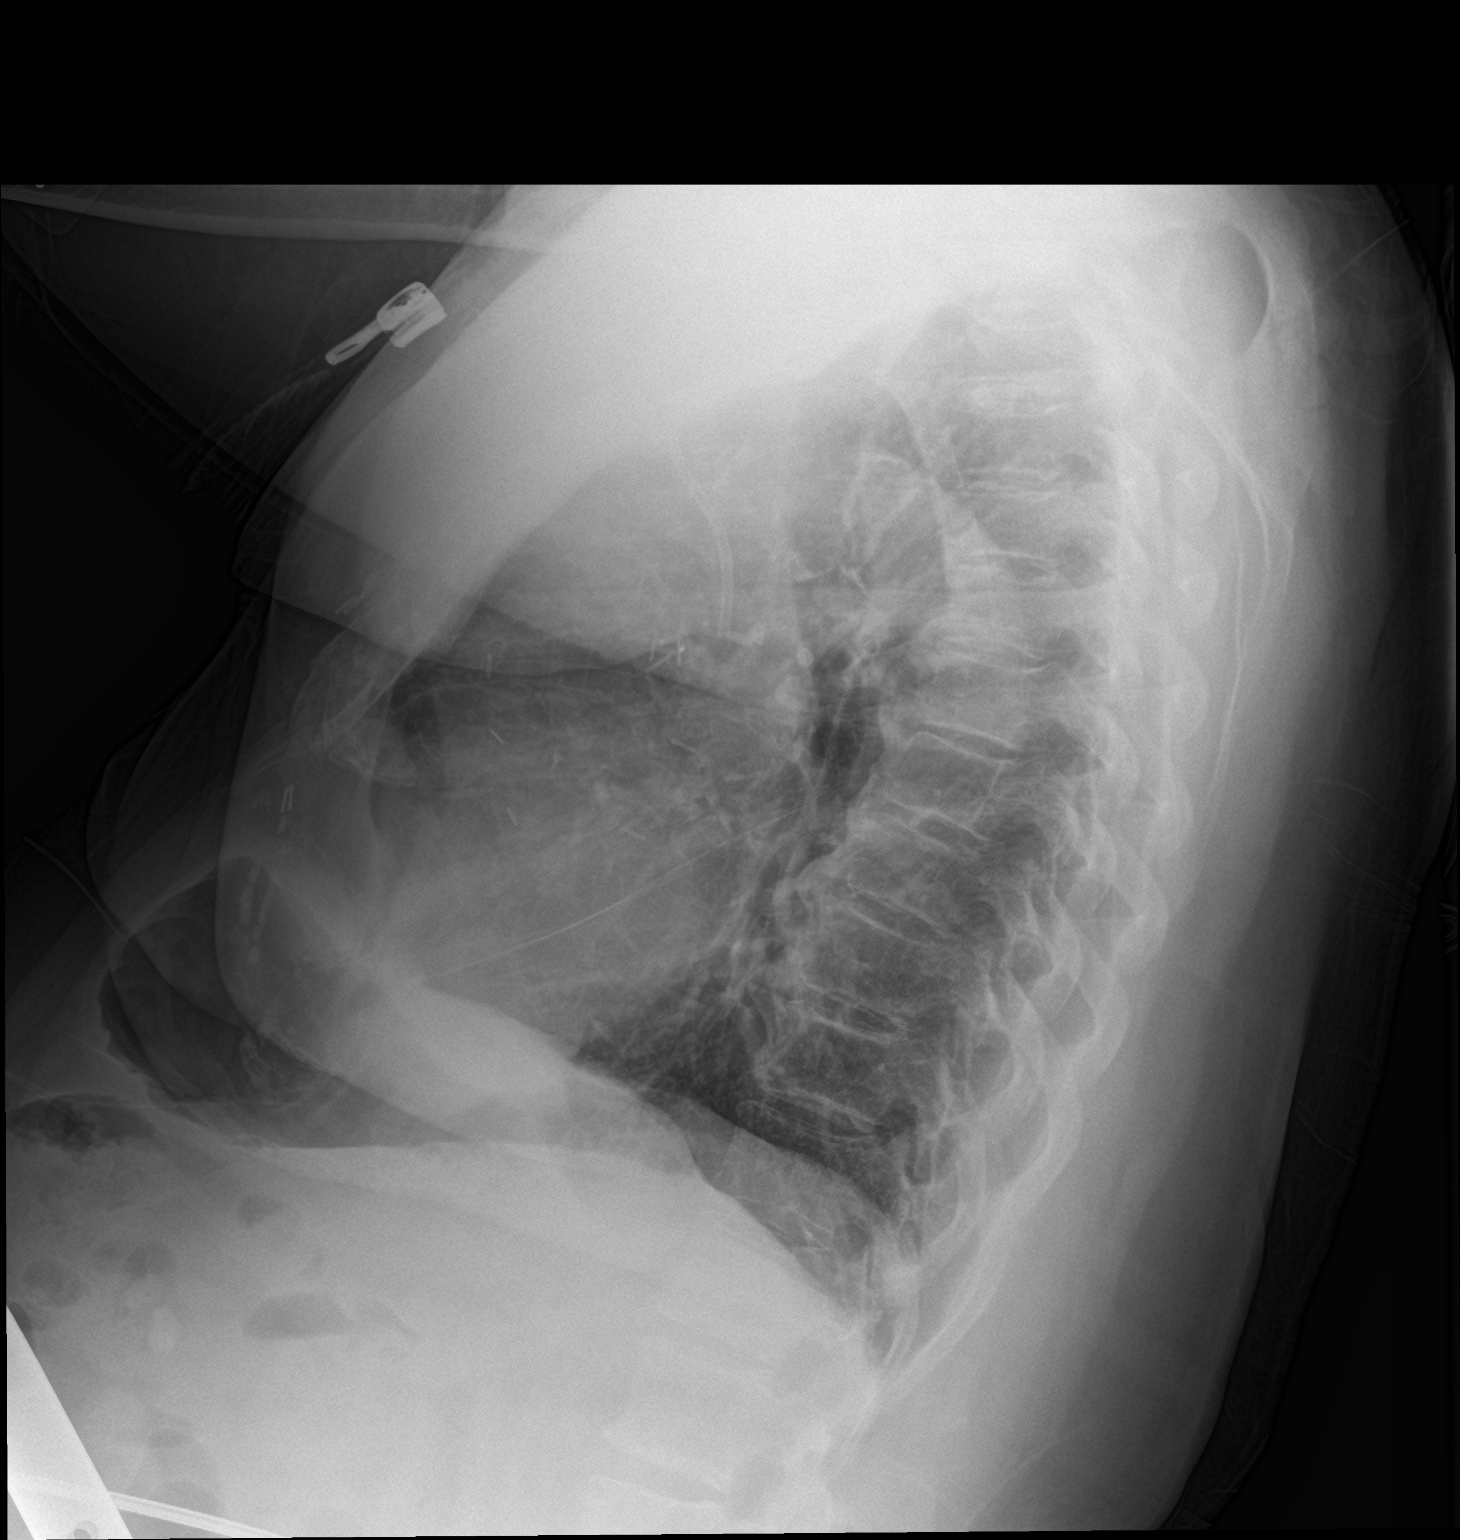

[2 of 2 positions shown; findings below may reference images not displayed]

FINDINGS: Left chest port catheter tip projects over the SVC. Right-sided
axillary surgical clips are again seen. There is no focal lung
infiltrate, pleural effusion or pneumothorax. The cardiomediastinal
silhouette is stable, the heart is mildly enlarged. No acute
fractures are seen.
IMPRESSION: 1. No acute cardiopulmonary process.
2. Stable mild cardiomegaly.
